# Patient Record
Sex: Female | Born: 1960
Health system: Southern US, Community
[De-identification: ages and names within clinical notes are randomized; demographics above are authoritative.]

## PROBLEM LIST (undated history)

## (undated) DIAGNOSIS — I10 Essential (primary) hypertension: Secondary | ICD-10-CM

## (undated) DIAGNOSIS — Z83719 Family history of colon polyps, unspecified: Secondary | ICD-10-CM

## (undated) DIAGNOSIS — F329 Major depressive disorder, single episode, unspecified: Secondary | ICD-10-CM

## (undated) DIAGNOSIS — M1990 Unspecified osteoarthritis, unspecified site: Secondary | ICD-10-CM

## (undated) DIAGNOSIS — Z923 Personal history of irradiation: Secondary | ICD-10-CM

## (undated) DIAGNOSIS — C50919 Malignant neoplasm of unspecified site of unspecified female breast: Secondary | ICD-10-CM

## (undated) DIAGNOSIS — E119 Type 2 diabetes mellitus without complications: Secondary | ICD-10-CM

## (undated) DIAGNOSIS — F32A Depression, unspecified: Secondary | ICD-10-CM

## (undated) DIAGNOSIS — H269 Unspecified cataract: Secondary | ICD-10-CM

## (undated) DIAGNOSIS — K5792 Diverticulitis of intestine, part unspecified, without perforation or abscess without bleeding: Secondary | ICD-10-CM

## (undated) DIAGNOSIS — K219 Gastro-esophageal reflux disease without esophagitis: Secondary | ICD-10-CM

## (undated) DIAGNOSIS — F419 Anxiety disorder, unspecified: Secondary | ICD-10-CM

## (undated) DIAGNOSIS — Z803 Family history of malignant neoplasm of breast: Secondary | ICD-10-CM

## (undated) DIAGNOSIS — E785 Hyperlipidemia, unspecified: Secondary | ICD-10-CM

## (undated) DIAGNOSIS — K589 Irritable bowel syndrome without diarrhea: Secondary | ICD-10-CM

## (undated) DIAGNOSIS — G4730 Sleep apnea, unspecified: Secondary | ICD-10-CM

## (undated) DIAGNOSIS — C50912 Malignant neoplasm of unspecified site of left female breast: Secondary | ICD-10-CM

## (undated) DIAGNOSIS — Z8371 Family history of colonic polyps: Secondary | ICD-10-CM

## (undated) HISTORY — DX: Unspecified osteoarthritis, unspecified site: M19.90

## (undated) HISTORY — DX: Anxiety disorder, unspecified: F41.9

## (undated) HISTORY — DX: Type 2 diabetes mellitus without complications: E11.9

## (undated) HISTORY — DX: Depression, unspecified: F32.A

## (undated) HISTORY — DX: Irritable bowel syndrome, unspecified: K58.9

## (undated) HISTORY — DX: Diverticulitis of intestine, part unspecified, without perforation or abscess without bleeding: K57.92

## (undated) HISTORY — DX: Family history of malignant neoplasm of breast: Z80.3

## (undated) HISTORY — PX: KNEE ARTHROSCOPY: SHX127

## (undated) HISTORY — DX: Essential (primary) hypertension: I10

## (undated) HISTORY — DX: Sleep apnea, unspecified: G47.30

## (undated) HISTORY — DX: Family history of colonic polyps: Z83.71

## (undated) HISTORY — DX: Family history of colon polyps, unspecified: Z83.719

## (undated) HISTORY — PX: JOINT REPLACEMENT: SHX530

## (undated) HISTORY — PX: WISDOM TOOTH EXTRACTION: SHX21

## (undated) HISTORY — DX: Major depressive disorder, single episode, unspecified: F32.9

---

## 1985-01-03 DIAGNOSIS — K5792 Diverticulitis of intestine, part unspecified, without perforation or abscess without bleeding: Secondary | ICD-10-CM

## 1985-01-03 HISTORY — DX: Diverticulitis of intestine, part unspecified, without perforation or abscess without bleeding: K57.92

## 1993-01-03 HISTORY — PX: LAPAROSCOPIC CHOLECYSTECTOMY: SUR755

## 2004-09-17 ENCOUNTER — Emergency Department (HOSPITAL_COMMUNITY): Admission: EM | Admit: 2004-09-17 | Discharge: 2004-09-17 | Payer: Self-pay | Admitting: Emergency Medicine

## 2007-06-05 ENCOUNTER — Encounter: Admission: RE | Admit: 2007-06-05 | Discharge: 2007-06-05 | Payer: Self-pay | Admitting: Unknown Physician Specialty

## 2007-09-19 ENCOUNTER — Encounter: Admission: RE | Admit: 2007-09-19 | Discharge: 2007-09-19 | Payer: Self-pay | Admitting: Interventional Radiology

## 2008-01-01 ENCOUNTER — Encounter: Admission: RE | Admit: 2008-01-01 | Discharge: 2008-01-01 | Payer: Self-pay | Admitting: Interventional Radiology

## 2008-01-08 ENCOUNTER — Encounter: Admission: RE | Admit: 2008-01-08 | Discharge: 2008-01-08 | Payer: Self-pay | Admitting: Interventional Radiology

## 2008-01-29 ENCOUNTER — Encounter: Admission: RE | Admit: 2008-01-29 | Discharge: 2008-01-29 | Payer: Self-pay | Admitting: Interventional Radiology

## 2008-04-01 ENCOUNTER — Encounter: Admission: RE | Admit: 2008-04-01 | Discharge: 2008-04-01 | Payer: Self-pay | Admitting: Interventional Radiology

## 2008-04-29 ENCOUNTER — Encounter: Admission: RE | Admit: 2008-04-29 | Discharge: 2008-04-29 | Payer: Self-pay | Admitting: Interventional Radiology

## 2008-10-08 ENCOUNTER — Encounter: Admission: RE | Admit: 2008-10-08 | Discharge: 2008-10-08 | Payer: Self-pay | Admitting: Interventional Radiology

## 2009-01-03 HISTORY — PX: TOTAL KNEE ARTHROPLASTY: SHX125

## 2009-10-14 ENCOUNTER — Inpatient Hospital Stay (HOSPITAL_COMMUNITY): Admission: RE | Admit: 2009-10-14 | Discharge: 2009-10-19 | Payer: Self-pay | Admitting: Orthopedic Surgery

## 2009-10-20 ENCOUNTER — Telehealth: Payer: Self-pay | Admitting: *Deleted

## 2010-01-20 ENCOUNTER — Emergency Department (HOSPITAL_COMMUNITY)
Admission: EM | Admit: 2010-01-20 | Discharge: 2010-01-21 | Disposition: A | Discharge status: Other Acute Inpatient Hospital | Payer: Self-pay | Source: Home / Self Care | Admitting: Emergency Medicine

## 2010-01-21 ENCOUNTER — Inpatient Hospital Stay (HOSPITAL_COMMUNITY)
Admission: AD | Admit: 2010-01-21 | Discharge: 2010-01-22 | Payer: Self-pay | Source: Home / Self Care | Attending: Psychiatry | Admitting: Psychiatry
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
## 2010-01-25 ENCOUNTER — Encounter: Payer: Self-pay | Admitting: Interventional Radiology

## 2010-01-25 LAB — DIFFERENTIAL
Basophils Absolute: 0.3 10*3/uL — ABNORMAL HIGH (ref 0.0–0.1)
Lymphocytes Relative: 38 % (ref 12–46)
Monocytes Absolute: 0.6 10*3/uL (ref 0.1–1.0)
Neutro Abs: 3.6 10*3/uL (ref 1.7–7.7)

## 2010-01-25 LAB — URINALYSIS, ROUTINE W REFLEX MICROSCOPIC
Bilirubin Urine: NEGATIVE
Ketones, ur: NEGATIVE mg/dL
Nitrite: NEGATIVE
Protein, ur: NEGATIVE mg/dL
Urobilinogen, UA: 0.2 mg/dL (ref 0.0–1.0)

## 2010-01-25 LAB — BASIC METABOLIC PANEL
CO2: 25 mEq/L (ref 19–32)
Glucose, Bld: 123 mg/dL — ABNORMAL HIGH (ref 70–99)
Potassium: 4.1 mEq/L (ref 3.5–5.1)
Sodium: 140 mEq/L (ref 135–145)

## 2010-01-25 LAB — RAPID URINE DRUG SCREEN, HOSP PERFORMED
Amphetamines: NOT DETECTED
Cocaine: NOT DETECTED
Opiates: NOT DETECTED
Tetrahydrocannabinol: NOT DETECTED

## 2010-01-25 LAB — CBC
HCT: 45.9 % (ref 36.0–46.0)
Hemoglobin: 14.6 g/dL (ref 12.0–15.0)
MCHC: 31.8 g/dL (ref 30.0–36.0)

## 2010-01-26 ENCOUNTER — Other Ambulatory Visit (HOSPITAL_COMMUNITY)
Admission: RE | Admit: 2010-01-26 | Discharge: 2010-02-02 | Payer: Self-pay | Source: Home / Self Care | Attending: Psychiatry | Admitting: Psychiatry

## 2010-02-02 NOTE — Progress Notes (Signed)
Summary: Medication  Phone Note Call from Patient   Caller: Patient Summary of Call: Call from pt regarding need to fill prescription at the [facility].  Pt said that her pharmacy does not carry the medication.  Was told by Wonda Olds  admitting to have the prescription taken to [facility].  Pt was given the number to call the pharmacy to zsee if she can bring over the presription.Angelina Ok RN  October 20, 2009 4:17 PM  Initial call taken by: Angelina Ok RN,  October 20, 2009 4:17 PM

## 2010-02-03 ENCOUNTER — Other Ambulatory Visit (HOSPITAL_COMMUNITY): Payer: BC Managed Care – PPO | Attending: Psychiatry | Admitting: Psychiatry

## 2010-02-03 DIAGNOSIS — F410 Panic disorder [episodic paroxysmal anxiety] without agoraphobia: Secondary | ICD-10-CM | POA: Insufficient documentation

## 2010-02-03 DIAGNOSIS — F411 Generalized anxiety disorder: Secondary | ICD-10-CM | POA: Insufficient documentation

## 2010-02-03 DIAGNOSIS — K5730 Diverticulosis of large intestine without perforation or abscess without bleeding: Secondary | ICD-10-CM | POA: Insufficient documentation

## 2010-02-03 DIAGNOSIS — F339 Major depressive disorder, recurrent, unspecified: Secondary | ICD-10-CM | POA: Insufficient documentation

## 2010-02-03 DIAGNOSIS — G4733 Obstructive sleep apnea (adult) (pediatric): Secondary | ICD-10-CM | POA: Insufficient documentation

## 2010-02-03 DIAGNOSIS — M1990 Unspecified osteoarthritis, unspecified site: Secondary | ICD-10-CM | POA: Insufficient documentation

## 2010-02-04 ENCOUNTER — Other Ambulatory Visit (HOSPITAL_COMMUNITY): Payer: BC Managed Care – PPO | Admitting: Psychiatry

## 2010-02-05 ENCOUNTER — Other Ambulatory Visit (HOSPITAL_COMMUNITY): Payer: BC Managed Care – PPO | Admitting: Psychiatry

## 2010-02-05 ENCOUNTER — Encounter (HOSPITAL_COMMUNITY): Payer: Self-pay | Admitting: Psychiatry

## 2010-02-08 ENCOUNTER — Other Ambulatory Visit (HOSPITAL_COMMUNITY): Payer: BC Managed Care – PPO | Admitting: Psychiatry

## 2010-02-08 ENCOUNTER — Ambulatory Visit (HOSPITAL_COMMUNITY): Payer: Self-pay | Admitting: Psychiatry

## 2010-02-09 ENCOUNTER — Ambulatory Visit (HOSPITAL_COMMUNITY): Payer: Self-pay | Admitting: Psychiatry

## 2010-02-09 ENCOUNTER — Other Ambulatory Visit (HOSPITAL_COMMUNITY): Payer: BC Managed Care – PPO | Admitting: Psychiatry

## 2010-02-10 ENCOUNTER — Other Ambulatory Visit (HOSPITAL_COMMUNITY): Payer: BC Managed Care – PPO | Admitting: Psychiatry

## 2010-02-11 ENCOUNTER — Other Ambulatory Visit (HOSPITAL_COMMUNITY): Payer: BC Managed Care – PPO | Admitting: Psychiatry

## 2010-02-12 ENCOUNTER — Other Ambulatory Visit (HOSPITAL_COMMUNITY): Payer: BC Managed Care – PPO | Admitting: Psychiatry

## 2010-02-15 ENCOUNTER — Other Ambulatory Visit (HOSPITAL_COMMUNITY): Payer: BC Managed Care – PPO | Admitting: Psychiatry

## 2010-02-16 ENCOUNTER — Other Ambulatory Visit (HOSPITAL_COMMUNITY): Payer: BC Managed Care – PPO | Admitting: Psychiatry

## 2010-02-17 ENCOUNTER — Other Ambulatory Visit (HOSPITAL_COMMUNITY): Payer: BC Managed Care – PPO | Admitting: Psychiatry

## 2010-02-18 ENCOUNTER — Other Ambulatory Visit (HOSPITAL_COMMUNITY): Payer: BC Managed Care – PPO | Admitting: Psychiatry

## 2010-02-19 ENCOUNTER — Other Ambulatory Visit (HOSPITAL_COMMUNITY): Payer: BC Managed Care – PPO | Admitting: Psychiatry

## 2010-02-22 ENCOUNTER — Ambulatory Visit (HOSPITAL_COMMUNITY): Payer: Self-pay | Admitting: Psychiatry

## 2010-02-22 ENCOUNTER — Ambulatory Visit (INDEPENDENT_AMBULATORY_CARE_PROVIDER_SITE_OTHER): Payer: BC Managed Care – PPO | Admitting: Psychiatry

## 2010-02-22 DIAGNOSIS — F332 Major depressive disorder, recurrent severe without psychotic features: Secondary | ICD-10-CM

## 2010-02-22 DIAGNOSIS — F411 Generalized anxiety disorder: Secondary | ICD-10-CM

## 2010-02-23 ENCOUNTER — Other Ambulatory Visit (HOSPITAL_COMMUNITY): Payer: BC Managed Care – PPO | Admitting: Psychiatry

## 2010-03-02 ENCOUNTER — Ambulatory Visit (INDEPENDENT_AMBULATORY_CARE_PROVIDER_SITE_OTHER): Payer: BC Managed Care – PPO | Admitting: Psychiatry

## 2010-03-02 ENCOUNTER — Encounter (HOSPITAL_COMMUNITY): Payer: BC Managed Care – PPO | Admitting: Psychiatry

## 2010-03-05 ENCOUNTER — Encounter (INDEPENDENT_AMBULATORY_CARE_PROVIDER_SITE_OTHER): Payer: BC Managed Care – PPO | Admitting: Psychiatry

## 2010-03-05 DIAGNOSIS — F332 Major depressive disorder, recurrent severe without psychotic features: Secondary | ICD-10-CM

## 2010-03-05 DIAGNOSIS — F411 Generalized anxiety disorder: Secondary | ICD-10-CM

## 2010-03-17 LAB — BASIC METABOLIC PANEL
BUN: 15 mg/dL (ref 6–23)
CO2: 29 mEq/L (ref 19–32)
Calcium: 8.8 mg/dL (ref 8.4–10.5)
Creatinine, Ser: 1.35 mg/dL — ABNORMAL HIGH (ref 0.4–1.2)
Glucose, Bld: 129 mg/dL — ABNORMAL HIGH (ref 70–99)

## 2010-03-17 LAB — CBC
MCH: 28.8 pg (ref 26.0–34.0)
MCHC: 33.8 g/dL (ref 30.0–36.0)
Platelets: 211 10*3/uL (ref 150–400)

## 2010-03-18 LAB — URINALYSIS, ROUTINE W REFLEX MICROSCOPIC
Hgb urine dipstick: NEGATIVE
Specific Gravity, Urine: 1.024 (ref 1.005–1.030)
Urobilinogen, UA: 0.2 mg/dL (ref 0.0–1.0)

## 2010-03-18 LAB — PROTIME-INR
INR: 1.04 (ref 0.00–1.49)
INR: 1.15 (ref 0.00–1.49)
Prothrombin Time: 14.9 seconds (ref 11.6–15.2)

## 2010-03-18 LAB — CBC
HCT: 31.8 % — ABNORMAL LOW (ref 36.0–46.0)
Hemoglobin: 10.7 g/dL — ABNORMAL LOW (ref 12.0–15.0)
Hemoglobin: 11.3 g/dL — ABNORMAL LOW (ref 12.0–15.0)
MCHC: 33.6 g/dL (ref 30.0–36.0)
Platelets: 206 10*3/uL (ref 150–400)
Platelets: 227 10*3/uL (ref 150–400)
RBC: 3.97 MIL/uL (ref 3.87–5.11)
RDW: 13.7 % (ref 11.5–15.5)
RDW: 14.1 % (ref 11.5–15.5)
WBC: 10.2 10*3/uL (ref 4.0–10.5)
WBC: 13.3 10*3/uL — ABNORMAL HIGH (ref 4.0–10.5)
WBC: 6 10*3/uL (ref 4.0–10.5)

## 2010-03-18 LAB — ABO/RH: ABO/RH(D): A NEG

## 2010-03-18 LAB — COMPREHENSIVE METABOLIC PANEL
ALT: 22 U/L (ref 0–35)
Albumin: 3.9 g/dL (ref 3.5–5.2)
Alkaline Phosphatase: 120 U/L — ABNORMAL HIGH (ref 39–117)
BUN: 10 mg/dL (ref 6–23)
Calcium: 9.4 mg/dL (ref 8.4–10.5)
Potassium: 4.1 mEq/L (ref 3.5–5.1)
Sodium: 142 mEq/L (ref 135–145)
Total Protein: 7.3 g/dL (ref 6.0–8.3)

## 2010-03-18 LAB — BASIC METABOLIC PANEL
Calcium: 8.4 mg/dL (ref 8.4–10.5)
Calcium: 8.4 mg/dL (ref 8.4–10.5)
Creatinine, Ser: 0.65 mg/dL (ref 0.4–1.2)
GFR calc Af Amer: >60 mL/min (ref >60)
GFR calc non Af Amer: >60 mL/min (ref >60)
GFR calc non Af Amer: >60 mL/min (ref >60)
Glucose, Bld: 141 mg/dL — ABNORMAL HIGH (ref 70–99)
Potassium: 4 mEq/L (ref 3.5–5.1)
Sodium: 133 mEq/L — ABNORMAL LOW (ref 135–145)
Sodium: 136 mEq/L (ref 135–145)

## 2010-03-18 LAB — URINE MICROSCOPIC-ADD ON

## 2010-03-18 LAB — TYPE AND SCREEN
ABO/RH(D): A NEG
Antibody Screen: NEGATIVE

## 2015-01-04 DIAGNOSIS — C50919 Malignant neoplasm of unspecified site of unspecified female breast: Secondary | ICD-10-CM

## 2015-01-04 HISTORY — DX: Malignant neoplasm of unspecified site of unspecified female breast: C50.919

## 2015-05-25 ENCOUNTER — Encounter (HOSPITAL_COMMUNITY): Payer: Self-pay | Admitting: Psychiatry

## 2015-05-25 ENCOUNTER — Ambulatory Visit (INDEPENDENT_AMBULATORY_CARE_PROVIDER_SITE_OTHER): Payer: Medicare Other | Admitting: Psychiatry

## 2015-05-25 ENCOUNTER — Encounter (INDEPENDENT_AMBULATORY_CARE_PROVIDER_SITE_OTHER): Payer: Self-pay

## 2015-05-25 VITALS — BP 132/88 | HR 99 | Ht 67.5 in | Wt 283.0 lb

## 2015-05-25 DIAGNOSIS — F331 Major depressive disorder, recurrent, moderate: Secondary | ICD-10-CM | POA: Diagnosis not present

## 2015-05-25 DIAGNOSIS — F1010 Alcohol abuse, uncomplicated: Secondary | ICD-10-CM | POA: Diagnosis not present

## 2015-05-25 DIAGNOSIS — I10 Essential (primary) hypertension: Secondary | ICD-10-CM | POA: Insufficient documentation

## 2015-05-25 MED ORDER — DULOXETINE HCL 60 MG PO CPEP: 60.0000 mg | ORAL_CAPSULE | Freq: Every day | ORAL | Status: DC

## 2015-05-25 MED ORDER — LAMOTRIGINE 25 MG PO TABS: ORAL_TABLET | ORAL | Status: DC
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
## 2015-05-27 ENCOUNTER — Ambulatory Visit (HOSPITAL_COMMUNITY): Payer: Self-pay | Admitting: Clinical

## 2015-06-15 ENCOUNTER — Ambulatory Visit (HOSPITAL_COMMUNITY): Payer: Self-pay | Admitting: Psychiatry

## 2015-06-16 ENCOUNTER — Ambulatory Visit (HOSPITAL_COMMUNITY): Payer: Medicare Other | Admitting: Psychiatry

## 2015-07-02 ENCOUNTER — Telehealth (HOSPITAL_COMMUNITY): Payer: Self-pay | Admitting: *Deleted

## 2015-07-02 NOTE — Telephone Encounter (Signed)
phone call from patient.  She cancelled appointment due to she has another appointment that day also, she do not have the money.

## 2015-07-06 ENCOUNTER — Ambulatory Visit (HOSPITAL_COMMUNITY): Payer: Self-pay | Admitting: Psychiatry

## 2015-07-28 ENCOUNTER — Other Ambulatory Visit (HOSPITAL_COMMUNITY): Payer: Self-pay

## 2015-07-28 DIAGNOSIS — F331 Major depressive disorder, recurrent, moderate: Secondary | ICD-10-CM

## 2015-07-28 MED ORDER — LAMOTRIGINE 25 MG PO TABS: ORAL_TABLET | ORAL | 0 refills | Status: DC

## 2015-07-28 MED ORDER — DULOXETINE HCL 60 MG PO CPEP: 60.0000 mg | ORAL_CAPSULE | Freq: Every day | ORAL | 0 refills | Status: DC

## 2015-08-17 ENCOUNTER — Ambulatory Visit (HOSPITAL_COMMUNITY): Payer: Self-pay | Admitting: Psychiatry

## 2015-08-18 ENCOUNTER — Encounter (HOSPITAL_COMMUNITY): Payer: Self-pay | Admitting: Psychiatry

## 2015-09-01 ENCOUNTER — Encounter (INDEPENDENT_AMBULATORY_CARE_PROVIDER_SITE_OTHER): Payer: Self-pay | Admitting: *Deleted

## 2015-09-03 ENCOUNTER — Other Ambulatory Visit (HOSPITAL_COMMUNITY): Payer: Self-pay

## 2015-09-03 DIAGNOSIS — F331 Major depressive disorder, recurrent, moderate: Secondary | ICD-10-CM

## 2015-09-03 MED ORDER — DULOXETINE HCL 60 MG PO CPEP: 60.0000 mg | ORAL_CAPSULE | Freq: Every day | ORAL | 0 refills | Status: DC

## 2015-09-04 HISTORY — PX: BREAST BIOPSY: SHX20

## 2015-09-08 ENCOUNTER — Encounter (HOSPITAL_COMMUNITY): Payer: Self-pay

## 2015-09-08 ENCOUNTER — Ambulatory Visit (HOSPITAL_COMMUNITY): Payer: Self-pay | Admitting: Psychiatry

## 2015-09-09 ENCOUNTER — Encounter (INDEPENDENT_AMBULATORY_CARE_PROVIDER_SITE_OTHER): Payer: Self-pay | Admitting: *Deleted

## 2015-09-21 ENCOUNTER — Ambulatory Visit (HOSPITAL_COMMUNITY): Payer: Self-pay | Admitting: Psychiatry

## 2015-09-23 ENCOUNTER — Encounter (HOSPITAL_COMMUNITY): Payer: Self-pay | Admitting: Psychiatry

## 2015-09-23 ENCOUNTER — Ambulatory Visit (INDEPENDENT_AMBULATORY_CARE_PROVIDER_SITE_OTHER): Payer: Medicare Other | Admitting: Psychiatry

## 2015-09-23 DIAGNOSIS — F331 Major depressive disorder, recurrent, moderate: Secondary | ICD-10-CM | POA: Diagnosis not present
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
## 2015-09-24 ENCOUNTER — Ambulatory Visit (INDEPENDENT_AMBULATORY_CARE_PROVIDER_SITE_OTHER): Payer: Medicare Other | Admitting: Internal Medicine

## 2015-09-24 ENCOUNTER — Encounter (INDEPENDENT_AMBULATORY_CARE_PROVIDER_SITE_OTHER): Payer: Self-pay | Admitting: Internal Medicine

## 2015-09-24 ENCOUNTER — Other Ambulatory Visit (INDEPENDENT_AMBULATORY_CARE_PROVIDER_SITE_OTHER): Payer: Self-pay | Admitting: Internal Medicine

## 2015-09-24 ENCOUNTER — Encounter (INDEPENDENT_AMBULATORY_CARE_PROVIDER_SITE_OTHER): Payer: Self-pay | Admitting: *Deleted

## 2015-09-24 ENCOUNTER — Telehealth (INDEPENDENT_AMBULATORY_CARE_PROVIDER_SITE_OTHER): Payer: Self-pay | Admitting: *Deleted

## 2015-09-24 VITALS — BP 100/80 | HR 72 | Temp 97.8°F | Ht 68.0 in | Wt 273.6 lb

## 2015-09-24 DIAGNOSIS — K625 Hemorrhage of anus and rectum: Secondary | ICD-10-CM | POA: Diagnosis not present

## 2015-09-24 DIAGNOSIS — K5732 Diverticulitis of large intestine without perforation or abscess without bleeding: Secondary | ICD-10-CM | POA: Diagnosis not present

## 2015-09-24 DIAGNOSIS — R195 Other fecal abnormalities: Secondary | ICD-10-CM

## 2015-09-24 DIAGNOSIS — E119 Type 2 diabetes mellitus without complications: Secondary | ICD-10-CM | POA: Insufficient documentation

## 2015-09-24 NOTE — Patient Instructions (Signed)
The risks and benefits such as perforation, bleeding, and infection were reviewed with the patient and is agreeable. 

## 2015-09-24 NOTE — Progress Notes (Signed)
   Subjective:    Patient ID: Lindsay Pacheco, female    DOB: 1960-02-18, 55 y.o.   MRN: [id number]  HPI Referred by Dr. Manuella Ghazi for hx of diverticulitis, rectal bleeding, hx of anal fissure/colonoscopy. Her last colonoscopy was in her 52s. She had diverticulitis. She says 3-4 months ago she had upper abdominal pain. Her BMs changed from diarrhea to constipation. She would have 3-4 BMs a day. She had sporadic vomiting. During this time, she said she would wipe and really did not feel like she was clean. She says she became raw in her rectal area. Symptoms for about 2 months and is better now.  She saw her PCP and was advised to have a colonoscopy. She also states she has rectal bleeding off and on. She tells a month ago 8/8/017) she had LLQ pain and CT revealed mild sigmoid diverticulitis. No drainable fluid collection,abscess.  diverticulitis, She was treated with Cipro and Flagyl.  Her appetite is good. No weight. Her BMs are normal.She does have some semi-constipation. She does see some rectal bleeding. Occasionally has some left lower abdominal pain.  Family hx of colon polyps in mother. No family hx of colon cancer.   08/10/2015 WBC 11.7, H and H 13.2 and 40.8, [place] ct 315. Review of Systems Past Medical History:  Diagnosis Date  . Anxiety   . Arthritis   . Depression   . Diabetes mellitus, type II ([place])   . Diverticulitis 1987  . Hypertension   . Irritable bowel syndrome (IBS)   . Sleep apnea   . Sleep apnea     Past Surgical History:  Procedure Laterality Date  . CHOLECYSTECTOMY    . KNEE ARTHROPLASTY      Allergies  Allergen Reactions  . Sulfa Antibiotics Nausea And Vomiting    Current Outpatient Prescriptions on File Prior to Visit  Medication Sig Dispense Refill  . atorvastatin (LIPITOR) 10 MG tablet Take 10 mg by mouth daily.    . DULoxetine (CYMBALTA) 60 MG capsule Take 1 capsule (60 mg total) by mouth daily. 30 capsule 0  . lamoTRIgine (LAMICTAL) 25 MG tablet Take  1 tab daily for 1 week and than 2 tab daily 60 tablet 0  . lisinopril-hydrochlorothiazide (PRINZIDE,ZESTORETIC) 20-12.5 MG tablet Take 1 tablet by mouth daily.  6  . LORazepam (ATIVAN) 0.5 MG tablet Take 0.5 mg by mouth daily as needed for anxiety.    . metFORMIN (GLUCOPHAGE) 500 MG tablet Take by mouth 2 (two) times daily with a meal.     No current facility-administered medications on file prior to visit.        Objective:   Physical Exam Blood pressure 100/80, pulse 72, temperature 97.8 F (36.6 C), height 5\' 8"  (1.727 m), weight 273 lb 9.6 oz (124.1 kg). Alert and oriented. Skin warm and dry. Oral mucosa is moist.   . Sclera anicteric, conjunctivae is pink. Thyroid not enlarged. No cervical lymphadenopathy. Lungs clear. Heart regular rate and rhythm.  Abdomen is soft. Bowel sounds are positive. No hepatomegaly. No abdominal masses felt. No tenderness.  No edema to lower extremities. Patient is alert and oriented.       Assessment & Plan:  Change in stool, Rectal bleeding. Hx of diverticulitis: Colonic neoplasm needs to be ruled out. The risks and benefits such as perforation, bleeding, and infection were reviewed with the patient and is agreeable.

## 2015-09-24 NOTE — Telephone Encounter (Signed)
Patient needs trilyte 

## 2015-09-25 MED ORDER — PEG 3350-KCL-NA BICARB-NACL 420 G PO SOLR: 4000.0000 mL | Freq: Once | ORAL | 0 refills | Status: AC

## 2015-09-29 ENCOUNTER — Encounter (INDEPENDENT_AMBULATORY_CARE_PROVIDER_SITE_OTHER): Payer: Self-pay

## 2015-09-30 DIAGNOSIS — C50912 Malignant neoplasm of unspecified site of left female breast: Secondary | ICD-10-CM

## 2015-09-30 HISTORY — DX: Malignant neoplasm of unspecified site of left female breast: C50.912

## 2015-10-07 ENCOUNTER — Encounter (HOSPITAL_COMMUNITY): Payer: Self-pay | Admitting: Psychiatry

## 2015-10-07 ENCOUNTER — Ambulatory Visit (INDEPENDENT_AMBULATORY_CARE_PROVIDER_SITE_OTHER): Payer: Medicare Other | Admitting: Psychiatry

## 2015-10-07 DIAGNOSIS — F331 Major depressive disorder, recurrent, moderate: Secondary | ICD-10-CM

## 2015-10-07 NOTE — Progress Notes (Signed)
Patient:  Lindsay Pacheco   DOB: Jul 02, 1960  MR Number: [id number]  Location: [facility]:  [id number] [place],  [place], [address]  Start: Wednesday 10/07/2015 8:08 AM End: Wednesday 10/07/2015 9:05 AM  Provider/Observer:     Maurice Small, MSW, LCSW   Chief Complaint:      Chief Complaint  Patient presents with  . Depression    Reason For Service:  Patient is a 55 year old female referred for services by psychiatrist [name] to improve coping skills. She presents with a long standing history of symptoms of depression and anxiety that began over 20 years ago. Symptoms have worsened in the past 3 years since mother moved in with patient as mother is vey confrontational. Mother also expereinces confusion and memory problems per patient's report.  She reports additional stress related to concerns about her daughter. Patient also reports discord with sister and her father. Patient reports 1 psychiatriac hospitalization due to suicide attempt in 2012. She has participated in outpatient medication management at [facility] and currently is seeing psychiatrist [name] at [facility] in [place]. She also participated in IOP.  She participated in outpatient therapy briefly in this practice several years ago until she lost her insurance.  Patient reports crying spells, excessive worry, panic attacks, nervousness, low energy, sleep difficulty due to sleep apnea, memory problmes, loss of interest , irritabiliy, ruminating thoughts.     Interventions Strategy:  Supportive  Participation Level:   Active  Participation Quality:  Appropriate      Behavioral Observation:  Casual, Alert, and Appropriate.   Current Psychosocial Factors: Recent diagnosis of breast cancer, stressful relationship with mother, father, and sister, worries about daughter in college, financial stress   Content of Session:   Established rapport, reviewed symptoms, facilitated expression of feelings and normalization of  shock and denial regarding recent diagnosis, discussed other stressors including relationship with family members, praised and reinforced patient's use of her support system, discussed rationale for and practiced controlled breathing, assigned patient to practice controlled breathing 5 minutes 2 times per day  Current Status:   Less depressed mood, panic attacks, nervousness, low energy, sleep difficulty, ruminating thoughts, irritability  Suicidal/Homicidal:   No  Patient Progress:   Fair. Patient reports less depressed mood but increased stress and anxiety since last session. She was diagnosed with breast cancer in her left breast last Friday, 10/02/2015. She is scheduled for surgical consult on 10/21/2015. She reports blaming self for this as she was unable to have regular checkups and mammograms due to not having insurance. She expresses confidence in her medical providers and is trying to be optimistic. She has informed family and close friends and reports strong support. She expresses concern how this may affect her daughter. Patient has tried to engage in activities and reports helping out with a yard sale  on Saturday and going to visit her daughter at [facility] yesterday. She continues to express frustration with her mother and is concerned about mother's behavior and reactions to patient's diagnosis and upcoming treatment for patient.  Target Goals:   1. Establish rapport, 2. learn and implement calming strategies  Last Reviewed:     Goals Addressed Today:   1,2     Plan:   Return again in 2 weeks. Patient agrees to practice controlled breathing 5 minutes 2 times per day.  Impression/Diagnosis:   Patient  presents with a long standing history of symptoms of depression and anxiety that began over 20 years ago. Symptoms  have worsened in the past 3 years since mother moved in with patient as mother is vey confrontational. She reports 1 psychiatriac hospitalization due to suicide  attempt in 2012. She has participated in outpatient medication management at [facility] and currently is seeing psychiatrist [name] at [facility] in [place]. She also participated in IOP.  She participated in outpatient therapy briefly in this practice several years ago until she lost her insurance.  Patient reports crying spells, excessive worry, panic attacks, nervousness, low energy, sleep difficulty due to sleep apnea, memory problmes, loss of interest , irritabiliy, and ruminating thoughts.      Diagnosis:  Axis I: Major Depressive Disorder, Recurrent, Moderate          Axis II: Deferred

## 2015-10-08 ENCOUNTER — Other Ambulatory Visit (HOSPITAL_COMMUNITY): Payer: Self-pay

## 2015-10-08 DIAGNOSIS — F331 Major depressive disorder, recurrent, moderate: Secondary | ICD-10-CM

## 2015-10-08 MED ORDER — LAMOTRIGINE 25 MG PO TABS: ORAL_TABLET | ORAL | 0 refills | Status: DC

## 2015-10-08 MED ORDER — DULOXETINE HCL 60 MG PO CPEP: 60.0000 mg | ORAL_CAPSULE | Freq: Every day | ORAL | 0 refills | Status: DC

## 2015-10-08 NOTE — Progress Notes (Signed)
Patient called for a refill, she has not seen [name] since May and was going to schedule at [facility] office. Patient has not been seen there yet, and has no showed 2 appointments with Dr. Harrington Challenger, I spoke to patient and expressed the importance of keeping the appointment or at least giving 24 hour notice. Patient verbalized her understanding and I told her that she could get one more month of medication. Rx's were sent into [facility] - Lamictal and Cymbalta 30 day orders. Patient understands that this is the last refill and she needs to be seen.

## 2015-10-09 ENCOUNTER — Other Ambulatory Visit: Payer: Self-pay | Admitting: General Surgery

## 2015-10-09 DIAGNOSIS — Z170 Estrogen receptor positive status [ER+]: Principal | ICD-10-CM

## 2015-10-09 DIAGNOSIS — C50212 Malignant neoplasm of upper-inner quadrant of left female breast: Secondary | ICD-10-CM

## 2015-10-12 ENCOUNTER — Telehealth (HOSPITAL_COMMUNITY): Payer: Self-pay | Admitting: *Deleted

## 2015-10-12 ENCOUNTER — Ambulatory Visit
Admission: RE | Admit: 2015-10-12 | Discharge: 2015-10-12 | Disposition: A | Discharge status: Home / Self Care | Payer: Medicare Other | Source: Ambulatory Visit | Attending: General Surgery | Admitting: General Surgery

## 2015-10-12 DIAGNOSIS — C50212 Malignant neoplasm of upper-inner quadrant of left female breast: Secondary | ICD-10-CM

## 2015-10-12 DIAGNOSIS — Z170 Estrogen receptor positive status [ER+]: Principal | ICD-10-CM

## 2015-10-12 MED ORDER — GADOBENATE DIMEGLUMINE 529 MG/ML IV SOLN
20.0000 mL | Freq: Once | INTRAVENOUS | Status: AC | PRN
  Administered 2015-10-12: 20 mL via INTRAVENOUS

## 2015-10-12 NOTE — Telephone Encounter (Signed)
Pt called stating she called to sch an appt with Dr. Harrington Challenger and no one called her back. Per pt she is aware she missed 2 appts with Dr. Harrington Challenger and then stated stated she's aware that she has up to 3 times. Informed pt that per no show policy, if she missed 2 consecutive appts with provider, per policy office can not resch appt but staff will need to speak with provider first. Office then informed pt that with the 3 no show, policy states dismissal from practice and she already have 3 in system but staff need to talk to Dr. Harrington Challenger. Per pt why didn't staff tell her the first time when they told her they will have to call back and when will she know when she can be resch or not because she was only given 30 days worth of medications and the next time she can see [name] is in December. Informed pt that office now have a new provider that see adult patients due to Dr. Harrington Challenger trying to transition in taking majority children but office still have to talk with Dr. Harrington Challenger. Pt then increased her voice and stated well who will she be seeing and why was this information not provided to her the first time. Pt then stated in a very unpleasant way and increased her tone of voice, why does staff have to consult with Dr. Harrington Challenger if the policy is stated if we have to follow the no show policy? Informed pt that staff will have to call her back and staff apologized about not calling her back sooner but provider was seeing patients and when she is less busy but  in betweeen her pts staff will call her back. Per pt when will staff call her back and who will be her new provider. Per pt she wants Dr. Harrington Challenger to call her back. Informed pt staff will try her hardest to call her back today after she consults with provider. Spoke with Dr. Harrington Challenger and informed her pt is [name] pt that wants to transfer to this office and did schedule 2 new pt appt with her but no showed for both and also have another no show on file for office. Asked Dr. Harrington Challenger if she would  like office to resch pt to see her again or resch new pt appt to see [name]? Per Dr. Harrington Challenger, per no show policy, pt can not be resch and to inform pt. Called pt at 4:08pm 10-12-15 and lmtcb. Office called back at 4:09pm and pt daughter Lindsay Pacheco picked up and stated pt is at another doctor's appt. Informed Lindsay Pacheco to please have pt call office and office number was provided.

## 2015-10-13 ENCOUNTER — Encounter: Payer: Self-pay | Admitting: Radiation Oncology

## 2015-10-13 ENCOUNTER — Telehealth (HOSPITAL_COMMUNITY): Payer: Self-pay

## 2015-10-13 NOTE — Telephone Encounter (Signed)
Telephone call with patient to follow up on call from 10/12/15 with patient's request the [facility] office still allow her to be scheduled at their facility following 2 missed initial MD evaluations.  Informed patient [name] reviewed and would see her on Thursday 10/22/15 at the [place] office, as schedule with Radene Journey from their reception and for patient to be there at 1pm.  Informed patient of need to keep this appointment due to she had missed the previous two and patient agreed with plan. Patient reported recently being diagnosed with breast cancer and wanted to make sure she is seen by a psychiatrist before she runs out of medication by the beginning of November.  Patient stated plan to keep newly scheduled appointment and will call if any problems.

## 2015-10-13 NOTE — Progress Notes (Addendum)
Location of Breast Cancer: Left Breast  11:00'clock and 12 0'clock position Upper Inner  quadrant  Histology per Pathology Report: 09/30/2015: Left breast bx x 2 :Invasive ductal carcinoma,low grade,localized in situ,cribiform type,with calcifications,   Receptor Status: ER(90%+), PR (90%), Her2-neu (), Ki-()  Did patient present with symptoms (if so, please note symptoms) or was this found on screening mammography?:   Past/Anticipated interventions by surgeon, if any: Dr. Darleen Crocker, waiting on MRI results  That was done 10/12/15, before scheduling lumpectomy  Past/Anticipated interventions by medical oncology, if any: Chemotherapy :Dr. Whitney Muse , MD, 10/15/2015 new appt  Lymphedema issues, if any:  NO , last bowel movement  Slight spot bleeding on tissue, has GI appt  scheduled PA 12/09/15 then will schedule colonoscopy, has diverticulosis Pain issues, if any: NO  SAFETY ISSUES: No  Prior radiation? NO  Pacemaker/ICD?NO  Possible current pregnancy? NO  Is the patient on methotrexate?  NO  Current Complaints / other details:, Divorced,  menses age 22, G49P1, first live birth age 27, , birth control pills till age 62, [place]  , Major depression,  Anxiety, non smoker, hx marijuana use Paternal aunt Breast  Cancer in her 28's  , Paternal Uncle lung cancer, Paternal grandmother and Maternal grandfather lung cancer, all smokers, Maternal great aunt tumor on left femur, removed, age 75 living,   Allergies: Sulfa antibiotics= Nausea and vomiting BP 134/79 (BP Location: Right Leg, Patient Position: Sitting, Cuff Size: Large)   Pulse 99   Temp 97.7 F (36.5 C) (Oral)   Resp 16   Ht 5' 7.5" (1.715 m)   Wt 274 lb 6.4 oz (124.5 kg)   SpO2 100% Comment: room air  BMI 42.34 kg/m   Wt Readings from Last 3 Encounters:  10/14/15 274 lb 6.4 oz (124.5 kg)  09/24/15 273 lb 9.6 oz (124.1 kg)   Rebecca Eaton, RN 10/13/2015,7:50 AM

## 2015-10-14 ENCOUNTER — Encounter: Payer: Self-pay | Admitting: Radiation Oncology

## 2015-10-14 ENCOUNTER — Ambulatory Visit
Admission: RE | Admit: 2015-10-14 | Discharge: 2015-10-14 | Disposition: A | Discharge status: Home / Self Care | Payer: Medicare Other | Source: Ambulatory Visit | Attending: Radiation Oncology | Admitting: Radiation Oncology

## 2015-10-14 VITALS — BP 134/79 | HR 99 | Temp 97.7°F | Resp 16 | Ht 67.5 in | Wt 274.4 lb

## 2015-10-14 DIAGNOSIS — Z79899 Other long term (current) drug therapy: Secondary | ICD-10-CM | POA: Insufficient documentation

## 2015-10-14 DIAGNOSIS — Z7984 Long term (current) use of oral hypoglycemic drugs: Secondary | ICD-10-CM | POA: Diagnosis not present

## 2015-10-14 DIAGNOSIS — C50212 Malignant neoplasm of upper-inner quadrant of left female breast: Secondary | ICD-10-CM | POA: Diagnosis not present

## 2015-10-14 DIAGNOSIS — Z170 Estrogen receptor positive status [ER+]: Secondary | ICD-10-CM | POA: Diagnosis not present

## 2015-10-14 DIAGNOSIS — Z510 Encounter for antineoplastic radiation therapy: Secondary | ICD-10-CM | POA: Insufficient documentation

## 2015-10-14 DIAGNOSIS — Z882 Allergy status to sulfonamides status: Secondary | ICD-10-CM | POA: Insufficient documentation

## 2015-10-14 HISTORY — DX: Malignant neoplasm of unspecified site of unspecified female breast: C50.919

## 2015-10-14 NOTE — Progress Notes (Signed)
Please see the Nurse Progress Note in the MD Initial Consult Encounter for this patient. 

## 2015-10-14 NOTE — Progress Notes (Signed)
Radiation Oncology         [phone] ________________________________  Name: Lindsay Pacheco MRN: [id number]  Date: 10/14/2015  DOB: [birth year]  LX:BWIO,MBTDHR, MD  [name], MD     REFERRING PHYSICIAN: [name], MD   DIAGNOSIS: The encounter diagnosis was Malignant neoplasm of upper-inner quadrant of left breast in female, estrogen receptor positive ([place]).   Malignant neoplasm of upper inner quadrant of female breast Lindsay Pacheco)   Staging form: Breast, AJCC 7th Edition   - Clinical stage from 10/14/2015: Stage IIA (T2, N0, M0) - Signed by Kyung Rudd, MD on 10/14/2015    HISTORY OF PRESENT ILLNESS::Lindsay Pacheco is a 55 y.o. female who is seen for an initial consultation visit regarding the patient's diagnosis of malignant neoplasm of upper-inner quadrant of left breast.  The patient had a suspicious finding on screening mammogram which demonstrated a specific mass with possible several adjacent areas, potentially cysts. Per pathology report on 09/30/15 left breast was positive for invasive ductal carcinoma, low grade, localized in situ, cribriform type, with calcifications. A second biopsy also revealed ductal carcinoma in situ. Regarding the invasive cancer, Receptor status: ER (90%), PR (90%), Her2 (-).   An MRI scan was performed on 10/12/2015. This demonstrated a 2.2 cm irregular enhancing mass at the 12:00 position corresponding to the low-grade invasive ductal carcinoma. At the 11:00 position a 1.1 cm irregular enhancing mass was also seen corresponding to the diagnosis of ductal carcinoma in situ. No abnormal appearing lymph nodes were present. Dr. Donne Pacheco has been waiting on MRI results from 10/12/15 before scheduling lumpectomy. She does appear to be a good candidate for breast conservation treatment. Patient has an appointment with Dr. Whitney Pacheco scheduled for 10/05/15.    PREVIOUS RADIATION THERAPY: No   PAST MEDICAL HISTORY:  has a past medical history of  Anxiety; Arthritis; Breast cancer ([place]) (09/30/2015); Depression; Diabetes mellitus, type II ([place]); Diverticulitis (1987); Hypertension; Irritable bowel syndrome (IBS); Sleep apnea; and Sleep apnea.     PAST SURGICAL HISTORY: Past Surgical History:  Procedure Laterality Date  . CHOLECYSTECTOMY    . KNEE ARTHROPLASTY       FAMILY HISTORY: family history includes Anxiety disorder in her father; Depression in her father and sister.   SOCIAL HISTORY:  reports that she has never smoked. She has never used smokeless tobacco. She reports that she drinks alcohol. She reports that she uses drugs, including Marijuana.   ALLERGIES: Sulfa antibiotics   MEDICATIONS:  Current Outpatient Prescriptions  Medication Sig Dispense Refill  . atorvastatin (LIPITOR) 10 MG tablet Take 10 mg by mouth daily.    . DULoxetine (CYMBALTA) 60 MG capsule Take 1 capsule (60 mg total) by mouth daily. 30 capsule 0  . lamoTRIgine (LAMICTAL) 25 MG tablet 2 tabs by mouth daily 60 tablet 0  . lisinopril-hydrochlorothiazide (PRINZIDE,ZESTORETIC) 20-12.5 MG tablet Take 1 tablet by mouth daily.  6  . LORazepam (ATIVAN) 0.5 MG tablet Take 0.5 mg by mouth daily as needed for anxiety.    . metFORMIN (GLUCOPHAGE) 500 MG tablet Take by mouth 2 (two) times daily with a meal.    . nitroGLYCERIN (NITROGLYN) 2 % ointment Apply topically 4 (four) times daily.     No current facility-administered medications for this encounter.      REVIEW OF SYSTEMS:  A 15 point review of systems is documented in the electronic medical record. This was obtained by the nursing staff. However, I reviewed this with the patient to discuss relevant findings and make  appropriate changes.  Pertinent items are noted in HPI.   Patient denies lymphedema issues, or pain at this time. Patient notes slight spot bleeding on tissue during last bowel movement. She has scheduled a GI appointment on 12/09/15 and will schedule a colonoscopy.  PHYSICAL EXAM:  height  is 5' 7.5" (1.715 m) and weight is 274 lb 6.4 oz (124.5 kg). Her oral temperature is 97.7 F (36.5 C). Her blood pressure is 134/79 and her pulse is 99. Her respiration is 16 and oxygen saturation is 100%.     ECOG = 0  0 - Asymptomatic (Fully active, able to carry on all predisease activities without restriction)  1 - Symptomatic but completely ambulatory (Restricted in physically strenuous activity but ambulatory and able to carry out work of a light or sedentary nature. For example, light housework, office work)  2 - Symptomatic, <50% in bed during the day (Ambulatory and capable of all self care but unable to carry out any work activities. Up and about more than 50% of waking hours)  3 - Symptomatic, >50% in bed, but not bedbound (Capable of only limited self-care, confined to bed or chair 50% or more of waking hours)  4 - Bedbound (Completely disabled. Cannot carry on any self-care. Totally confined to bed or chair)  5 - Death   Eustace Pen MM, Creech RH, Tormey DC, et al. [phone]). "Toxicity and response criteria of the [facility]". [place] Oncol. 5 (6): 649-55  General: Well-developed, in no acute distress HEENT: Normocephalic, atraumatic; oral cavity clear Neck: Supple without any lymphadenopathy Cardiovascular: Regular rate and rhythm Respiratory: Clear to auscultation bilaterally Breasts:   Palpable mass present centrally at the 12:00 position with some fibrocystic change within the left breast. No axillary lymphadenopathy on the left. Benign findings on the right.  GI: Soft, nontender, normal bowel sounds Extremities: No edema present Neuro: No focal deficits     LABORATORY DATA:  Lab Results  Component Value Date   WBC 7.5 01/20/2010   HGB 14.6 01/20/2010   HCT 45.9 01/20/2010   MCV 82.9 01/20/2010   PLT 304 01/20/2010   Lab Results  Component Value Date   NA 140 01/20/2010   K 4.1 01/20/2010   CL 106 01/20/2010   CO2 25 01/20/2010    Lab Results  Component Value Date   ALT 22 10/06/2009   AST 22 10/06/2009   ALKPHOS 120 (H) 10/06/2009   BILITOT 0.6 10/06/2009      RADIOGRAPHY: Mr Breast Bilateral W Wo Contrast  Result Date: 10/14/2015 CLINICAL DATA:  Ultrasound-guided core biopsy of a mass in the 12 o'clock region of the left breast revealed low grade invasive ductal carcinoma as well as ductal carcinoma in-situ with calcifications. Ultrasound-guided core biopsy of the mass in the 11 o'clock region of the left breast revealed ductal carcinoma in-situ as well as fibrotic and inflamed breast tissue. LABS:  Creatinine was obtained on site at [facility] at [address]Results: Creatinine 0.8 mg/dL. EXAM: BILATERAL BREAST MRI WITH AND WITHOUT CONTRAST TECHNIQUE: Multiplanar, multisequence MR images of both breasts were obtained prior to and following the intravenous administration of 20 ml of MultiHance. THREE-DIMENSIONAL MR IMAGE RENDERING ON INDEPENDENT WORKSTATION: Three-dimensional MR images were rendered by post-processing of the original MR data on an independent workstation. The three-dimensional MR images were interpreted, and findings are reported in the following complete MRI report for this study. Three dimensional images were evaluated at the independent DynaCad workstation COMPARISON:  Previous exam(s). FINDINGS: Breast composition: c. Heterogeneous fibroglandular tissue. Background parenchymal enhancement: Marked enhancing fibronodular pattern, bilaterally. Right breast: No mass or abnormal enhancement. Left breast: In the 12 o'clock region of the left breast there is an irregular spiculated enhancing mass measuring 2.2 x 1.5 x 1.3 cm. There is a signal void artifact in the mass from the biopsy clip. In the upper-inner quadrant of the left breast at 11 o'clock there is asymmetric irregular enhancement measuring 1.1 x 0.8 x 0.7 cm. Signal void artifact is seen in the enhancement from the biopsy clip. Both  enhancing masses are seen in a marked background of fibronodular enhancement obscuring borders. A benign intramammary lymph node is seen in the lateral aspect of the breast. Lymph nodes: No abnormal appearing lymph nodes. Ancillary findings:  None. IMPRESSION: 2.2 cm irregular enhancing mass in the 12 o'clock region of the left breast corresponding with the biopsied low grade invasive ductal carcinoma and ductal carcinoma in-situ. 1.1 cm irregular enhancement in the 11 o'clock region of the left breast corresponding with the biopsied ductal carcinoma in-situ with fibrotic and inflamed breast tissue. RECOMMENDATION: Treatment planning of the known left breast cancer is recommended. BI-RADS CATEGORY  6: Known biopsy-proven malignancy. Electronically Signed   By: Lillia Mountain M.D.   On: 10/14/2015 11:32       IMPRESSION:   Malignant neoplasm of upper inner quadrant of female breast [facility])   Staging form: Breast, AJCC 7th Edition   - Clinical stage from 10/14/2015: Stage IIA (T2, N0, M0) - Signed by Kyung Rudd, MD on 10/14/2015  The patient has a recent diagnosis of invasive ductile carcinoma with secondary DCIS of the left breast. She appears to be a good candidate for breast conservation treatment. if she proceeds with this, she will benefit from adjuvant radiation treatment. At this time, she would receive whole breast radiation treatment.   I discussed with the patient the role of adjuvant radiation treatment in this setting. We discussed the potential benefit of radiation treatment, especially with regards to local control of the patient's tumor. We also discussed the possible side effects and risks of such a treatment as well.  All of the patient's questions were answered. The patient wishes to proceed with radiation treatment at the appropriate time.  PLAN: I look forward to seeing the patient postoperatively to review her case and further discuss and coordinate an anticipated course of radiation  treatment. We discussed a 6.5 week course of radiation treatment.    ________________________________   [name], MD, PhD   **Disclaimer: This note was dictated with voice recognition software. Similar sounding words can inadvertently be transcribed and this note may contain transcription errors which may not have been corrected upon publication of note.** This document serves as a record of services personally performed by Kyung Rudd, MD. It was created on his behalf by Bethann Humble, a trained medical scribe. The creation of this record is based on the scribe's personal observations and the provider's statements to them. This document has been checked and approved by the attending provider.

## 2015-10-15 ENCOUNTER — Encounter (HOSPITAL_COMMUNITY): Payer: Medicare Other | Attending: Hematology & Oncology | Admitting: Hematology & Oncology

## 2015-10-15 ENCOUNTER — Encounter (HOSPITAL_COMMUNITY): Payer: Self-pay | Admitting: Hematology & Oncology

## 2015-10-15 ENCOUNTER — Other Ambulatory Visit: Payer: Self-pay | Admitting: General Surgery

## 2015-10-15 DIAGNOSIS — Z170 Estrogen receptor positive status [ER+]: Secondary | ICD-10-CM

## 2015-10-15 DIAGNOSIS — M1990 Unspecified osteoarthritis, unspecified site: Secondary | ICD-10-CM

## 2015-10-15 DIAGNOSIS — K5732 Diverticulitis of large intestine without perforation or abscess without bleeding: Secondary | ICD-10-CM

## 2015-10-15 DIAGNOSIS — C50212 Malignant neoplasm of upper-inner quadrant of left female breast: Secondary | ICD-10-CM

## 2015-10-15 DIAGNOSIS — M153 Secondary multiple arthritis: Secondary | ICD-10-CM

## 2015-10-15 DIAGNOSIS — C50912 Malignant neoplasm of unspecified site of left female breast: Secondary | ICD-10-CM

## 2015-10-15 DIAGNOSIS — E119 Type 2 diabetes mellitus without complications: Secondary | ICD-10-CM

## 2015-10-15 NOTE — Patient Instructions (Signed)
[place] at [facility] Discharge Instructions  RECOMMENDATIONS MADE BY THE CONSULTANT AND ANY TEST RESULTS WILL BE SENT TO YOUR REFERRING PHYSICIAN.  You saw Dr. Whitney Muse today. Appointments will be scheduled after surgery.  Thank you for choosing [place] at [facility] to provide your oncology and hematology care.  To afford each patient quality time with our provider, please arrive at least 15 minutes before your scheduled appointment time.   Beginning January 23rd 2017 lab work for the [facility] will be done in the  Main lab at [facility] on 1st floor. If you have a lab appointment with the [facility] please come in thru the  Main Entrance and check in at the main information desk  You need to re-schedule your appointment should you arrive 10 or more minutes late.  We strive to give you quality time with our providers, and arriving late affects you and other patients whose appointments are after yours.  Also, if you no show three or more times for appointments you may be dismissed from the clinic at the providers discretion.     Again, thank you for choosing [facility].  Our hope is that these requests will decrease the amount of time that you wait before being seen by our physicians.       _____________________________________________________________  Should you have questions after your visit to [facility], please contact our office at [phone] between the hours of 8:30 a.m. and 4:30 p.m.  Voicemails left after 4:30 p.m. will not be returned until the following business day.  For prescription refill requests, have your pharmacy contact our office.         Resources For Cancer Patients and their Caregivers ? American Cancer Society: Can assist with transportation, wigs, general needs, runs Look Good Feel Better.        202-182-5552 ? Cancer Care: Provides financial assistance, online  support groups, medication/co-pay assistance.  1-800-813-HOPE (718)205-1842) ? [place] Assists [place] cancer patients and their families through emotional , educational and financial support.  201-293-4964 ? [place] DSS Where to apply for food stamps, Medicaid and utility assistance. 718-877-6999 ? [facility]: Transportation to medical appointments. 215-228-0794 ? Social Security Administration: May apply for disability if have a Stage IV cancer. 806-310-4425 503-170-9835 ? [employer], Disability and Transit Services: Assists with nutrition, care and transit needs. [place] Support Programs: @10RELATIVEDAYS @ > Cancer Support Group  2nd Tuesday of the month 1pm-2pm, Journey Room  > Creative Journey  3rd Tuesday of the month 1130am-1pm, Journey Room  > Look Good Feel Better  1st Wednesday of the month 10am-12 noon, Journey Room (Call [place] to register [phone])

## 2015-10-15 NOTE — Progress Notes (Signed)
[place]  CONSULT NOTE  Patient Care Team: Monico Blitz, MD as PCP - General (Internal Medicine)  CHIEF COMPLAINTS/PURPOSE OF CONSULTATION:  L breast cancer   Malignant neoplasm of upper inner quadrant of female breast ([name])   09/23/2015 Mammogram    Digital diagnostic mammogram and Ultrasound. Area of shadowing L breast on U/S at 12:00 1 cm from nipple corresponds to distortion on mammography. 3 adjacent masses in L breast at 11:00 could represent benign cysts, indeterminate. These are in the UI quadrant of L breast      09/30/2015 Initial Biopsy    Ultrasound guided biopsy at 12 o clock and 11 o clock position      09/30/2015 Pathology Results    12 o clock position invasive ductal carcinoma low grade, localized DCIS, cribriform type with calcifications. 11 o clock fibrotic and inflamed breast tissue DCIS  ER> 90%, PR > 90%, HER 2 IHC 2+. FISH negative      10/14/2015 Imaging    MRI breasts 2.2 cm irregular enhancing mass in the 12 o'clock region of the left breast corresponding with the biopsied low grade invasive ductal carcinoma and ductal carcinoma in-situ. 1.1 cm irregular enhancement in the 11 o'clock region of the left breast corresponding with the biopsied ductal carcinoma in-situ with fibrotic and inflamed breast tissue.        HISTORY OF PRESENTING ILLNESS:  Lindsay Pacheco 55 y.o. female is here for a consultation due to newly diagnosed left breast cancer. She notes that this was found on screening mammography and reports prior breast itching but no other symptoms, ie. No palpable abnormality.  She saw Dr. Donne Hazel in consultation on 10/09/2015. She presents today for ongoing discussion. Details of her imaging and diagnosis are noted above.   Today, she is accompanied by her sister. She didn't have insurance until this year. She went for a physical and mammogram after getting insurance and found out she was diabetic and had breast cancer. She notes  that this has been somewhat difficult to deal with but she feels she is doing well.   [facility] says her appetite is normal. She has lost weight because of a recent bout with diverticulitis and because she is dieting.  She is disabled secondary to arthritis. She notes that this limits her ADL's.    MEDICAL HISTORY:  Past Medical History:  Diagnosis Date  . Anxiety   . Arthritis    "knees" (11/02/2015)  . Breast cancer, left breast ([place]) 09/30/2015  . Depression   . Diabetes mellitus, type II ([place])   . Diverticulitis 1987  . Hypertension   . Irritable bowel syndrome (IBS)   . Sleep apnea    does not use CPAP but "I'm suppose to" (11/02/2015)   No prior breast history  SURGICAL HISTORY: Past Surgical History:  Procedure Laterality Date  . BREAST BIOPSY Left 09/2015  . BREAST LUMPECTOMY WITH AXILLARY LYMPH NODE BIOPSY Left 11/02/2015   BREAST LUMPECTOMY WITH BRACKETED RADIOACTIVE SEED AND LEFT AXILLARY SENTINEL LYMPH NODE BIOPSY   . BREAST LUMPECTOMY WITH RADIOACTIVE SEED AND SENTINEL LYMPH NODE BIOPSY Left 11/02/2015   Procedure: LEFT BREAST LUMPECTOMY WITH BRACKETED RADIOACTIVE SEED AND LEFT AXILLARY SENTINEL LYMPH NODE BIOPSY;  Surgeon: [name], MD;  Location: [facility];  Service: General;  Laterality: Left;  . EVACUATION BREAST HEMATOMA Left 11/02/2015  . EVACUATION BREAST HEMATOMA Left 11/02/2015   Procedure: EVACUATION HEMATOMA BREAST;  Surgeon: [name], MD;  Location: [facility];  Service: General;  Laterality: Left;  . JOINT REPLACEMENT    . KNEE ARTHROSCOPY Right 2000s  . LAPAROSCOPIC CHOLECYSTECTOMY  1995  . TOTAL KNEE ARTHROPLASTY Right 2011    SOCIAL HISTORY: Social History   Social History  . Marital status: Divorced    Spouse name: N/A  . Number of children: N/A  . Years of education: N/A   Occupational History  . Not on file.   Social History Main Topics  . Smoking status: Never Smoker  . Smokeless  tobacco: Never Used  . Alcohol use Yes     Comment: 11/02/2015 "maybe 4 drinks/year"  . Drug use:     Types: Marijuana     Comment: 11/02/2015 "drug use in my past; now maybe 2 puffs 3-4 times/year; for social anxiety"  . Sexual activity: Not Currently   Other Topics Concern  . Not on file   Social History Narrative  . No narrative on file  Divorced 1 child, no Gc. Daughter graduates from [employer] December 15. Never smoked Likes to watch TV and read. She has arthritis in knees and it makes her difficult to do anything.  1 cat at home Used to work in [employer], [employer]  FAMILY HISTORY: Family History  Problem Relation Age of Onset  . Anxiety disorder Father   . Depression Father   . Other Father     Abestosis  . Depression Sister   . Diabetes Mother   2 siblings both healthy  No cancer in immediate family; both parents are living (both 30 years old) One paternal aunt who had breast cancer in her 25s  ALLERGIES:  is allergic to adhesive [tape] and sulfa antibiotics.  MEDICATIONS:  Current Outpatient Prescriptions  Medication Sig Dispense Refill  . acetaminophen (TYLENOL) 500 MG tablet Take 1,000 mg by mouth every 6 (six) hours as needed for moderate pain or headache.    Marland Kitchen atorvastatin (LIPITOR) 10 MG tablet Take 10 mg by mouth daily.    . divalproex (DEPAKOTE ER) 500 MG 24 hr tablet Take 1 tablet (500 mg total) by mouth at bedtime. (Patient not taking: Reported on 11/02/2015) 30 tablet 0  . DULoxetine (CYMBALTA) 60 MG capsule Take 1 capsule (60 mg total) by mouth daily. 30 capsule 0  . lamoTRIgine (LAMICTAL) 25 MG tablet Take 50 mg by mouth daily.    Marland Kitchen lisinopril-hydrochlorothiazide (PRINZIDE,ZESTORETIC) 20-12.5 MG tablet Take 1 tablet by mouth daily.  6  . LORazepam (ATIVAN) 0.5 MG tablet Take 0.5 mg by mouth daily as needed for anxiety.    . metFORMIN (GLUCOPHAGE) 500 MG tablet Take 500 mg by mouth 2 (two) times daily with a meal.     . oxyCODONE (OXY IR/ROXICODONE) 5 MG  immediate release tablet Take 1 tablet (5 mg total) by mouth every 6 (six) hours as needed for moderate pain, severe pain or breakthrough pain. 20 tablet 0   No current facility-administered medications for this visit.     Review of Systems  Constitutional: Negative.   HENT: Negative.   Eyes: Negative.   Respiratory: Negative.   Cardiovascular: Negative.   Gastrointestinal: Negative.   Genitourinary: Negative.   Musculoskeletal: Positive for back pain and joint pain.  Skin: Negative.   Neurological: Negative.   Endo/Heme/Allergies: Negative.   Psychiatric/Behavioral: Negative.   All other systems reviewed and are negative. 14 point ROS was done and is otherwise as detailed above or in HPI   PHYSICAL EXAMINATION: ECOG PERFORMANCE STATUS: 0 - Asymptomatic  Vitals with  BMI 10/14/2015  Height 5' 7.5"  Weight 274 lbs 6 oz  BMI 18.2  Systolic 993  Diastolic 79  Pulse 99  Respirations 16    Physical Exam  Constitutional: She is oriented to person, place, and time and well-developed, well-nourished, and in no distress.  HENT:  Head: Normocephalic and atraumatic.  Nose: Nose normal.  Mouth/Throat: Oropharynx is clear and moist. No oropharyngeal exudate.  Eyes: Conjunctivae and EOM are normal. Pupils are equal, round, and reactive to light. Right eye exhibits no discharge. Left eye exhibits no discharge. No scleral icterus.  Neck: Normal range of motion. Neck supple. No tracheal deviation present. No thyromegaly present.  Cardiovascular: Normal rate, regular rhythm and normal heart sounds.  Exam reveals no gallop and no friction rub.   No murmur heard. Pulmonary/Chest: Effort normal and breath sounds normal. She has no wheezes. She has no rales.  Abdominal: Soft. Bowel sounds are normal. She exhibits no distension and no mass. There is no tenderness. There is no rebound and no guarding.  Musculoskeletal: Normal range of motion. She exhibits no edema.  Lymphadenopathy:    She has  no cervical adenopathy.  Neurological: She is alert and oriented to person, place, and time. She has normal reflexes. No cranial nerve deficit. Gait normal. Coordination normal.  Skin: Skin is warm and dry. No rash noted.  Psychiatric: Mood, memory, affect and judgment normal.  Nursing note and vitals reviewed.   LABORATORY DATA:  I have reviewed the data as listed Lab Results  Component Value Date   WBC 11.5 (H) 11/02/2015   HGB 9.9 (L) 11/02/2015   HCT 30.5 (L) 11/02/2015   MCV 83.3 11/02/2015   PLT 252 11/02/2015   CMP     Component Value Date/Time   NA 137 10/28/2015 1311   K 3.6 10/28/2015 1311   CL 103 10/28/2015 1311   CO2 23 10/28/2015 1311   GLUCOSE 176 (H) 10/28/2015 1311   BUN 8 10/28/2015 1311   CREATININE 0.92 10/28/2015 1311   CALCIUM 9.5 10/28/2015 1311   PROT 7.3 10/06/2009 1040   ALBUMIN 3.9 10/06/2009 1040   AST 22 10/06/2009 1040   ALT 22 10/06/2009 1040   ALKPHOS 120 (H) 10/06/2009 1040   BILITOT 0.6 10/06/2009 1040   GFRNONAA >60 10/28/2015 1311   GFRAA >60 10/28/2015 1311     RADIOGRAPHIC STUDIES: I have personally reviewed the radiological images as listed and agreed with the findings in the report. No results found. PATHOLOGY:  09/30/15    ASSESSMENT & PLAN:  Malignant neoplasm of upper inner quadrant of female breast [facility])   Staging form: Breast, AJCC 7th Edition   - Clinical stage from 10/14/2015: Stage IIA (T2, N0, M0) - Signed by Kyung Rudd, MD on 10/14/2015 ER+ PR+ HER 2 - Arthritis Diverticulitis Diabetes  Patient is pleasant and well. I discussed her diagnosis in detail  and explained to her the most common type of breast cancers are ductal carcinomas. We discussed lumpectomy/XRT vs. Mastectomy. She prefers to have a lumpectomy.We reviewed the sentinel node procedure as she had questions. I briefed the patient on hormone receptor assays and HER2. She was told that we would not know if she would need an oncotype or mammoprint until  after her surgery. I advised her that this would help guide [place] as to whether or not chemotherapy would be of benefit at reducing recurrence. Patient asked about the difference between chemotherapy and radiation. I explained each of these to her.  Asher was given a comprehensive breast cancer handbook to take home with her.   She met with out patient navigator, Anderson Malta.  Follow up with patient after lumpectomy.Will also make sure pathology is reviewed once available and sent for appropriate testing.  This document serves as a record of services personally performed by Ancil Linsey, MD. It was created on her behalf by Elmyra Ricks, a trained medical scribe. The creation of this record is based on the scribe's personal observations and the provider's statements to them. This document has been checked and approved by the attending provider.  I have reviewed the above documentation for accuracy and completeness and I agree with the above.   This note was electronically signed.    Molli Hazard, MD  11/08/2015 2:22 PM

## 2015-10-16 NOTE — Progress Notes (Signed)
Met with pt face to face.  Introduced myself and explain a little bit about my role as the patient navigator.  Pt given a card with all my information on it.  Told pt to call if they had any questions or concerns.  Pt verbalized understanding.   Told pt to call me when she knows her surgery date and I will let her know when her follow up appt will be. Pt verbalized understanding.

## 2015-10-21 ENCOUNTER — Ambulatory Visit (INDEPENDENT_AMBULATORY_CARE_PROVIDER_SITE_OTHER): Payer: Medicare Other | Admitting: Psychiatry

## 2015-10-21 ENCOUNTER — Other Ambulatory Visit: Payer: Self-pay | Admitting: General Surgery

## 2015-10-21 ENCOUNTER — Encounter (HOSPITAL_COMMUNITY): Payer: Self-pay | Admitting: Psychiatry

## 2015-10-21 DIAGNOSIS — F331 Major depressive disorder, recurrent, moderate: Secondary | ICD-10-CM

## 2015-10-21 DIAGNOSIS — C50912 Malignant neoplasm of unspecified site of left female breast: Secondary | ICD-10-CM

## 2015-10-21 DIAGNOSIS — Z170 Estrogen receptor positive status [ER+]: Principal | ICD-10-CM
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
## 2015-10-21 NOTE — Progress Notes (Signed)
Patient:  Lindsay Pacheco   DOB: 03-Apr-1960  MR Number: [id number]  Location: [facility]:  [address] [place],  [place], [address]  Start: Wednesday 10/21/2015 8:10 AM  End: Wednesday 10/21/2015 9:00 AM     Provider/Observer:     Maurice Small, MSW, LCSW   Chief Complaint:      No chief complaint on file.   Reason For Service:  Patient is a 55 year old female referred for services by psychiatrist [name] to improve coping skills. She presents with a long standing history of symptoms of depression and anxiety that began over 20 years ago. Symptoms have worsened in the past 3 years since mother moved in with patient as mother is vey confrontational. Mother also expereinces confusion and memory problems per patient's report.  She reports additional stress related to concerns about her daughter. Patient also reports discord with sister and her father. Patient reports 1 psychiatriac hospitalization due to suicide attempt in 2012. She has participated in outpatient medication management at [facility] and currently is seeing psychiatrist [name] at [facility] in [place]. She also participated in IOP.  She participated in outpatient therapy briefly in this practice several years ago until she lost her insurance.  Patient reports crying spells, excessive worry, panic attacks, nervousness, low energy, sleep difficulty due to sleep apnea, memory problmes, loss of interest , irritabiliy, ruminating thoughts.     Interventions Strategy:  Supportive  Participation Level:   Active  Participation Quality:  Appropriate      Behavioral Observation:  Casual, Alert, and Appropriate.   Current Psychosocial Factors: Recent diagnosis of breast cancer, stressful relationship with mother, father, and sister, worries about daughter in college, financial stress   Content of Session:    reviewed symptoms, administered pH Q-9, facilitated expression of feelings and thoughts regarding recent appointments  and treatment plan for diagnosis of breast cancer, assisted patient identify strengths and to use support system, praised patient's efforts to use controlled breathing, discussed rationale for practicing controlled breathing consistently, assigned patient to practice controlled breathing 5 minutes 2 times per day, began to develop treatment plan  Current Status:   Less depressed mood, panic attacks, nervousness, low energy, sleep difficulty, ruminating thoughts, irritability  Suicidal/Homicidal:   No  Patient Progress:   Fair. Patient reports she has met with 2 oncologists and a surgeon since last session. She reports MRI indicated she has stage I breast cancer. She is waiting for a call from surgeon's office to schedule a lumpectomy. Patient reports being optimistic about treatment and expresses continued confidence in her medical providers. Patient plans to join a cancer support group and also plans to volunteer for participation in an upcoming yearly benefit event for cancer survivors. She reports sister has been very supportive and has attended medical appointments with patient. She expresses less worry about mother's reaction as sister helped patient set boundaries with mother regarding attending appointments. Patient has continued to have strong support from her friends. She reports stress regarding her daughter who came home this past weekend. She expresses frustration and resentment regarding daughter is daughter was argumentative and critical. Patient reports using controlled breathing to try to calm self.  Target Goals:   1. Learn and implement calming strategies  Last Reviewed:     Goals Addressed Today:   1,2     Plan:   Return again in 2 weeks. Patient agrees to practice controlled breathing 5 minutes 2 times per day.  Impression/Diagnosis:   Patient  presents with  a long standing history of symptoms of depression and anxiety that began over 20 years ago. Symptoms have worsened in the  past 3 years since mother moved in with patient as mother is vey confrontational. She reports 1 psychiatriac hospitalization due to suicide attempt in 2012. She has participated in outpatient medication management at [facility] and currently is seeing psychiatrist [name] at [facility] in [place]. She also participated in IOP.  She participated in outpatient therapy briefly in this practice several years ago until she lost her insurance.  Patient reports crying spells, excessive worry, panic attacks, nervousness, low energy, sleep difficulty due to sleep apnea, memory problmes, loss of interest , irritabiliy, and ruminating thoughts.      Diagnosis:  Axis I: Major Depressive Disorder, Recurrent, Moderate          Axis II: Deferred

## 2015-10-22 ENCOUNTER — Ambulatory Visit (INDEPENDENT_AMBULATORY_CARE_PROVIDER_SITE_OTHER): Payer: Medicare Other | Admitting: Psychiatry

## 2015-10-22 ENCOUNTER — Ambulatory Visit (HOSPITAL_COMMUNITY): Payer: Self-pay | Admitting: Psychiatry

## 2015-10-22 ENCOUNTER — Encounter (HOSPITAL_COMMUNITY): Payer: Self-pay | Admitting: Psychiatry

## 2015-10-22 ENCOUNTER — Other Ambulatory Visit: Payer: Self-pay

## 2015-10-22 DIAGNOSIS — Z833 Family history of diabetes mellitus: Secondary | ICD-10-CM | POA: Diagnosis not present

## 2015-10-22 DIAGNOSIS — F331 Major depressive disorder, recurrent, moderate: Secondary | ICD-10-CM | POA: Insufficient documentation

## 2015-10-22 DIAGNOSIS — Z79899 Other long term (current) drug therapy: Secondary | ICD-10-CM

## 2015-10-22 DIAGNOSIS — Z818 Family history of other mental and behavioral disorders: Secondary | ICD-10-CM | POA: Diagnosis not present

## 2015-10-22 MED ORDER — DIVALPROEX SODIUM ER 500 MG PO TB24: 500.0000 mg | ORAL_TABLET | Freq: Every day | ORAL | 0 refills | Status: DC

## 2015-10-22 MED ORDER — DULOXETINE HCL 60 MG PO CPEP: 60.0000 mg | ORAL_CAPSULE | Freq: Every day | ORAL | 0 refills | Status: DC

## 2015-10-22 NOTE — Patient Instructions (Signed)
1. Start Depakote 500 mg at night 2. Continue duloxetine 60 mg daily 3. Continue to see Ms. Peggy for therapy 4. Return to clinic in 1 month 5. Please fax your blood test to the clinic. You will need a test for liver function and thyroid if that is note done.

## 2015-10-23 ENCOUNTER — Other Ambulatory Visit: Payer: Self-pay

## 2015-10-26 ENCOUNTER — Encounter (HOSPITAL_BASED_OUTPATIENT_CLINIC_OR_DEPARTMENT_OTHER): Payer: Self-pay | Admitting: *Deleted

## 2015-10-26 ENCOUNTER — Telehealth (HOSPITAL_COMMUNITY): Payer: Self-pay | Admitting: Emergency Medicine

## 2015-10-26 NOTE — Progress Notes (Signed)
   10/26/15 1220  OBSTRUCTIVE SLEEP APNEA  Have you ever been diagnosed with sleep apnea through a sleep study? Yes  If yes, do you have and use a CPAP or BPAP machine every night? 0  Do you snore loudly (loud enough to be heard through closed doors)?  1  Do you often feel tired, fatigued, or sleepy during the daytime (such as falling asleep during driving or talking to someone)? 0  Has anyone observed you stop breathing during your sleep? 0  Do you have, or are you being treated for high blood pressure? 1  BMI more than 35 kg/m2? 1  Age > 14 (1-yes) 1  Female Gender (Yes=1) 0  Obstructive Sleep Apnea Score 4

## 2015-10-26 NOTE — Telephone Encounter (Signed)
Called pt to let her know that I made her follow up appt after she has surgery on 11/02/2015 with Dr Donne Hazel.  I will send off for oncotype vs. mammoprint once pathology comes back from surgery.  Then it will take about 14 days for that to result.  I made her appt for  11/25/2015 at 8:50 am.

## 2015-10-28 ENCOUNTER — Other Ambulatory Visit: Payer: Self-pay

## 2015-10-28 ENCOUNTER — Encounter (HOSPITAL_BASED_OUTPATIENT_CLINIC_OR_DEPARTMENT_OTHER)
Admission: RE | Admit: 2015-10-28 | Discharge: 2015-10-28 | Disposition: A | Discharge status: Home / Self Care | Payer: Medicare Other | Source: Ambulatory Visit | Attending: General Surgery | Admitting: General Surgery

## 2015-10-28 ENCOUNTER — Other Ambulatory Visit (HOSPITAL_COMMUNITY)
Admission: RE | Admit: 2015-10-28 | Discharge: 2015-10-28 | Disposition: A | Discharge status: Home / Self Care | Payer: Medicare Other | Source: Ambulatory Visit | Attending: Anesthesiology | Admitting: Anesthesiology

## 2015-10-28 DIAGNOSIS — C50212 Malignant neoplasm of upper-inner quadrant of left female breast: Secondary | ICD-10-CM | POA: Insufficient documentation

## 2015-10-28 LAB — BASIC METABOLIC PANEL
ANION GAP: 11 (ref 5–15)
BUN: 8 mg/dL (ref 6–20)
CALCIUM: 9.5 mg/dL (ref 8.9–10.3)
CO2: 23 mmol/L (ref 22–32)
CREATININE: 0.92 mg/dL (ref 0.44–1.00)
Chloride: 103 mmol/L (ref 101–111)
GFR calc Af Amer: >60 mL/min (ref >60)
GLUCOSE: 176 mg/dL — AB (ref 65–99)
Potassium: 3.6 mmol/L (ref 3.5–5.1)
Sodium: 137 mmol/L (ref 135–145)

## 2015-10-28 NOTE — Progress Notes (Signed)
EKG completed with noted abnormalities, pt new diabetic with severe sleep apnea and does not wear CPAP. Called anesthesia to review and Dr. Rodman Comp said " someone will review it tomorrow".  Informed pt that someone will call her tomorrow reguarding her EKG.  Pt verbalized understanding. Gave pt water with crystal light with instructions, pt verbalized understanding.

## 2015-10-29 NOTE — Progress Notes (Signed)
ekg review by Dr Veatrice Kells no further testing needed

## 2015-10-30 ENCOUNTER — Ambulatory Visit
Admission: RE | Admit: 2015-10-30 | Discharge: 2015-10-30 | Disposition: A | Discharge status: Home / Self Care | Payer: Medicare Other | Source: Ambulatory Visit | Attending: General Surgery | Admitting: General Surgery

## 2015-10-30 DIAGNOSIS — Z170 Estrogen receptor positive status [ER+]: Principal | ICD-10-CM

## 2015-10-30 DIAGNOSIS — C50912 Malignant neoplasm of unspecified site of left female breast: Secondary | ICD-10-CM

## 2015-11-02 ENCOUNTER — Ambulatory Visit (HOSPITAL_BASED_OUTPATIENT_CLINIC_OR_DEPARTMENT_OTHER): Payer: Medicare Other | Admitting: Certified Registered"

## 2015-11-02 ENCOUNTER — Ambulatory Visit (HOSPITAL_BASED_OUTPATIENT_CLINIC_OR_DEPARTMENT_OTHER): Admit: 2015-11-02 | Payer: Medicare Other | Admitting: General Surgery

## 2015-11-02 ENCOUNTER — Encounter (HOSPITAL_COMMUNITY)
Admission: RE | Disposition: A | Discharge status: Home / Self Care | Payer: Self-pay | Source: Ambulatory Visit | Attending: General Surgery

## 2015-11-02 ENCOUNTER — Ambulatory Visit
Admission: RE | Admit: 2015-11-02 | Discharge: 2015-11-02 | Disposition: A | Discharge status: Home / Self Care | Payer: Medicare Other | Source: Ambulatory Visit | Attending: General Surgery | Admitting: General Surgery

## 2015-11-02 ENCOUNTER — Ambulatory Visit (HOSPITAL_COMMUNITY)
Admission: RE | Admit: 2015-11-02 | Discharge: 2015-11-02 | Disposition: A | Discharge status: Home / Self Care | Payer: Medicare Other | Source: Ambulatory Visit | Attending: General Surgery | Admitting: General Surgery

## 2015-11-02 ENCOUNTER — Encounter (HOSPITAL_BASED_OUTPATIENT_CLINIC_OR_DEPARTMENT_OTHER): Payer: Self-pay

## 2015-11-02 ENCOUNTER — Observation Stay (HOSPITAL_BASED_OUTPATIENT_CLINIC_OR_DEPARTMENT_OTHER)
Admission: RE | Admit: 2015-11-02 | Discharge: 2015-11-03 | Disposition: A | Discharge status: Home / Self Care | Payer: Medicare Other | Source: Ambulatory Visit | Attending: General Surgery | Admitting: General Surgery

## 2015-11-02 DIAGNOSIS — Z882 Allergy status to sulfonamides status: Secondary | ICD-10-CM | POA: Diagnosis not present

## 2015-11-02 DIAGNOSIS — Z170 Estrogen receptor positive status [ER+]: Secondary | ICD-10-CM | POA: Diagnosis not present

## 2015-11-02 DIAGNOSIS — Y848 Other medical procedures as the cause of abnormal reaction of the patient, or of later complication, without mention of misadventure at the time of the procedure: Secondary | ICD-10-CM | POA: Insufficient documentation

## 2015-11-02 DIAGNOSIS — F329 Major depressive disorder, single episode, unspecified: Secondary | ICD-10-CM | POA: Insufficient documentation

## 2015-11-02 DIAGNOSIS — Z6841 Body Mass Index (BMI) 40.0 and over, adult: Secondary | ICD-10-CM | POA: Insufficient documentation

## 2015-11-02 DIAGNOSIS — Y753 Surgical instruments, materials and neurological devices (including sutures) associated with adverse incidents: Secondary | ICD-10-CM | POA: Insufficient documentation

## 2015-11-02 DIAGNOSIS — N6022 Fibroadenosis of left breast: Secondary | ICD-10-CM | POA: Diagnosis not present

## 2015-11-02 DIAGNOSIS — Z7984 Long term (current) use of oral hypoglycemic drugs: Secondary | ICD-10-CM | POA: Insufficient documentation

## 2015-11-02 DIAGNOSIS — L7632 Postprocedural hematoma of skin and subcutaneous tissue following other procedure: Secondary | ICD-10-CM | POA: Diagnosis not present

## 2015-11-02 DIAGNOSIS — N6012 Diffuse cystic mastopathy of left breast: Secondary | ICD-10-CM | POA: Insufficient documentation

## 2015-11-02 DIAGNOSIS — G4730 Sleep apnea, unspecified: Secondary | ICD-10-CM | POA: Insufficient documentation

## 2015-11-02 DIAGNOSIS — I10 Essential (primary) hypertension: Secondary | ICD-10-CM | POA: Insufficient documentation

## 2015-11-02 DIAGNOSIS — K589 Irritable bowel syndrome without diarrhea: Secondary | ICD-10-CM | POA: Diagnosis not present

## 2015-11-02 DIAGNOSIS — C50212 Malignant neoplasm of upper-inner quadrant of left female breast: Secondary | ICD-10-CM | POA: Diagnosis not present

## 2015-11-02 DIAGNOSIS — M1990 Unspecified osteoarthritis, unspecified site: Secondary | ICD-10-CM | POA: Diagnosis not present

## 2015-11-02 DIAGNOSIS — Z96651 Presence of right artificial knee joint: Secondary | ICD-10-CM | POA: Insufficient documentation

## 2015-11-02 DIAGNOSIS — C50912 Malignant neoplasm of unspecified site of left female breast: Secondary | ICD-10-CM | POA: Diagnosis present

## 2015-11-02 DIAGNOSIS — E119 Type 2 diabetes mellitus without complications: Secondary | ICD-10-CM | POA: Insufficient documentation

## 2015-11-02 DIAGNOSIS — D242 Benign neoplasm of left breast: Secondary | ICD-10-CM | POA: Diagnosis not present

## 2015-11-02 DIAGNOSIS — F419 Anxiety disorder, unspecified: Secondary | ICD-10-CM | POA: Diagnosis not present

## 2015-11-02 HISTORY — PX: EVACUATION BREAST HEMATOMA: SHX1537

## 2015-11-02 HISTORY — PX: BREAST LUMPECTOMY WITH AXILLARY LYMPH NODE BIOPSY: SHX5593

## 2015-11-02 HISTORY — PX: BREAST LUMPECTOMY: SHX2

## 2015-11-02 HISTORY — PX: BREAST LUMPECTOMY WITH RADIOACTIVE SEED AND SENTINEL LYMPH NODE BIOPSY: SHX6550

## 2015-11-02 HISTORY — DX: Malignant neoplasm of unspecified site of left female breast: C50.912

## 2015-11-02 LAB — GLUCOSE, CAPILLARY
GLUCOSE-CAPILLARY: 259 mg/dL — AB (ref 65–99)
GLUCOSE-CAPILLARY: 264 mg/dL — AB (ref 65–99)
Glucose-Capillary: 119 mg/dL — ABNORMAL HIGH (ref 65–99)
Glucose-Capillary: 135 mg/dL — ABNORMAL HIGH (ref 65–99)
Glucose-Capillary: 257 mg/dL — ABNORMAL HIGH (ref 65–99)

## 2015-11-02 LAB — CBC
HEMATOCRIT: 30.5 % — AB (ref 36.0–46.0)
HEMOGLOBIN: 9.9 g/dL — AB (ref 12.0–15.0)
MCH: 27 pg (ref 26.0–34.0)
MCHC: 32.5 g/dL (ref 30.0–36.0)
MCV: 83.3 fL (ref 78.0–100.0)
Platelets: 252 10*3/uL (ref 150–400)
RBC: 3.66 MIL/uL — ABNORMAL LOW (ref 3.87–5.11)
RDW: 13.8 % (ref 11.5–15.5)
WBC: 11.5 10*3/uL — AB (ref 4.0–10.5)

## 2015-11-02 SURGERY — EVACUATION, HEMATOMA, BREAST
Anesthesia: General | Site: Breast | Laterality: Left

## 2015-11-02 SURGERY — BREAST LUMPECTOMY WITH RADIOACTIVE SEED AND SENTINEL LYMPH NODE BIOPSY
Anesthesia: General | Site: Breast | Laterality: Left

## 2015-11-02 MED ORDER — SUCCINYLCHOLINE CHLORIDE 200 MG/10ML IV SOSY
PREFILLED_SYRINGE | INTRAVENOUS | Status: AC
  Filled 2015-11-02: qty 10

## 2015-11-02 MED ORDER — BUPIVACAINE HCL (PF) 0.25 % IJ SOLN
INTRAMUSCULAR | Status: DC | PRN
  Administered 2015-11-02: 12 mL

## 2015-11-02 MED ORDER — BUPIVACAINE-EPINEPHRINE 0.25% -1:200000 IJ SOLN
INTRAMUSCULAR | Status: AC
  Filled 2015-11-02: qty 1

## 2015-11-02 MED ORDER — ONDANSETRON HCL 4 MG/2ML IJ SOLN
4.0000 mg | Freq: Four times a day (QID) | INTRAMUSCULAR | Status: DC | PRN
  Administered 2015-11-02: 4 mg via INTRAVENOUS

## 2015-11-02 MED ORDER — SCOPOLAMINE 1 MG/3DAYS TD PT72: 1.0000 | MEDICATED_PATCH | Freq: Once | TRANSDERMAL | Status: DC | PRN

## 2015-11-02 MED ORDER — DEXAMETHASONE SODIUM PHOSPHATE 10 MG/ML IJ SOLN
INTRAMUSCULAR | Status: AC
  Filled 2015-11-02: qty 1

## 2015-11-02 MED ORDER — OXYCODONE HCL 5 MG PO TABS: 5.0000 mg | ORAL_TABLET | Freq: Four times a day (QID) | ORAL | 0 refills | Status: DC | PRN

## 2015-11-02 MED ORDER — CEFAZOLIN SODIUM-DEXTROSE 2-4 GM/100ML-% IV SOLN
2.0000 g | INTRAVENOUS | Status: AC
  Administered 2015-11-02: 2 g via INTRAVENOUS

## 2015-11-02 MED ORDER — MIDAZOLAM HCL 2 MG/2ML IJ SOLN
1.0000 mg | INTRAMUSCULAR | Status: DC | PRN
  Administered 2015-11-02 (×2): 1 mg via INTRAVENOUS
  Administered 2015-11-02: 2 mg via INTRAVENOUS

## 2015-11-02 MED ORDER — FENTANYL CITRATE (PF) 100 MCG/2ML IJ SOLN
INTRAMUSCULAR | Status: AC
  Filled 2015-11-02: qty 2

## 2015-11-02 MED ORDER — GABAPENTIN 300 MG PO CAPS
300.0000 mg | ORAL_CAPSULE | ORAL | Status: AC
  Administered 2015-11-02: 300 mg via ORAL

## 2015-11-02 MED ORDER — DEXAMETHASONE SODIUM PHOSPHATE 4 MG/ML IJ SOLN
INTRAMUSCULAR | Status: DC | PRN
  Administered 2015-11-02: 4 mg via INTRAVENOUS

## 2015-11-02 MED ORDER — DIVALPROEX SODIUM ER 500 MG PO TB24
500.0000 mg | ORAL_TABLET | Freq: Every day | ORAL | Status: DC
  Filled 2015-11-02: qty 1

## 2015-11-02 MED ORDER — PHENYLEPHRINE HCL 10 MG/ML IJ SOLN
INTRAMUSCULAR | Status: DC | PRN
  Administered 2015-11-02 (×6): 80 ug via INTRAVENOUS

## 2015-11-02 MED ORDER — MIDAZOLAM HCL 2 MG/2ML IJ SOLN
INTRAMUSCULAR | Status: AC
  Filled 2015-11-02: qty 2

## 2015-11-02 MED ORDER — METHYLENE BLUE 0.5 % INJ SOLN
INTRAVENOUS | Status: AC
  Filled 2015-11-02: qty 10

## 2015-11-02 MED ORDER — ONDANSETRON 4 MG PO TBDP: 4.0000 mg | ORAL_TABLET | Freq: Four times a day (QID) | ORAL | Status: DC | PRN

## 2015-11-02 MED ORDER — PROPOFOL 10 MG/ML IV BOLUS
INTRAVENOUS | Status: DC | PRN
  Administered 2015-11-02: 200 mg via INTRAVENOUS

## 2015-11-02 MED ORDER — FENTANYL CITRATE (PF) 100 MCG/2ML IJ SOLN
50.0000 ug | INTRAMUSCULAR | Status: AC | PRN
  Administered 2015-11-02: 25 ug via INTRAVENOUS
  Administered 2015-11-02: 100 ug via INTRAVENOUS
  Administered 2015-11-02: 25 ug via INTRAVENOUS
  Administered 2015-11-02: 50 ug via INTRAVENOUS

## 2015-11-02 MED ORDER — ONDANSETRON HCL 4 MG/2ML IJ SOLN
INTRAMUSCULAR | Status: AC
  Filled 2015-11-02: qty 2

## 2015-11-02 MED ORDER — LIDOCAINE 2% (20 MG/ML) 5 ML SYRINGE
INTRAMUSCULAR | Status: AC
  Filled 2015-11-02: qty 5

## 2015-11-02 MED ORDER — ACETAMINOPHEN 650 MG RE SUPP: 650.0000 mg | Freq: Four times a day (QID) | RECTAL | Status: DC | PRN

## 2015-11-02 MED ORDER — SIMETHICONE 80 MG PO CHEW: 40.0000 mg | CHEWABLE_TABLET | Freq: Four times a day (QID) | ORAL | Status: DC | PRN

## 2015-11-02 MED ORDER — SODIUM CHLORIDE 0.9 % IV SOLN
INTRAVENOUS | Status: DC
  Administered 2015-11-02 (×2): via INTRAVENOUS

## 2015-11-02 MED ORDER — OXYCODONE HCL 5 MG PO TABS
5.0000 mg | ORAL_TABLET | ORAL | Status: DC | PRN
  Administered 2015-11-02: 5 mg via ORAL
  Administered 2015-11-02: 10 mg via ORAL
  Filled 2015-11-02: qty 1
  Filled 2015-11-02: qty 2

## 2015-11-02 MED ORDER — GLYCOPYRROLATE 0.2 MG/ML IJ SOLN: 0.2000 mg | Freq: Once | INTRAMUSCULAR | Status: DC | PRN

## 2015-11-02 MED ORDER — LISINOPRIL-HYDROCHLOROTHIAZIDE 20-12.5 MG PO TABS: 1.0000 | ORAL_TABLET | Freq: Every day | ORAL | Status: DC

## 2015-11-02 MED ORDER — PHENYLEPHRINE HCL 10 MG/ML IJ SOLN
INTRAMUSCULAR | Status: DC | PRN
  Administered 2015-11-02 (×3): 40 ug via INTRAVENOUS

## 2015-11-02 MED ORDER — EPHEDRINE 5 MG/ML INJ
INTRAVENOUS | Status: AC
  Filled 2015-11-02: qty 10

## 2015-11-02 MED ORDER — ACETAMINOPHEN 500 MG PO TABS
ORAL_TABLET | ORAL | Status: AC
  Filled 2015-11-02: qty 2

## 2015-11-02 MED ORDER — LACTATED RINGERS IV SOLN
INTRAVENOUS | Status: DC | PRN
  Administered 2015-11-02: 15:00:00 via INTRAVENOUS

## 2015-11-02 MED ORDER — ACETAMINOPHEN 325 MG PO TABS: 650.0000 mg | ORAL_TABLET | Freq: Four times a day (QID) | ORAL | Status: DC | PRN

## 2015-11-02 MED ORDER — PROPOFOL 10 MG/ML IV BOLUS
INTRAVENOUS | Status: AC
  Filled 2015-11-02: qty 40

## 2015-11-02 MED ORDER — LISINOPRIL 20 MG PO TABS
20.0000 mg | ORAL_TABLET | Freq: Every day | ORAL | Status: DC
  Administered 2015-11-03: 20 mg via ORAL
  Filled 2015-11-02: qty 1

## 2015-11-02 MED ORDER — EPHEDRINE SULFATE 50 MG/ML IJ SOLN
INTRAMUSCULAR | Status: DC | PRN
  Administered 2015-11-02 (×5): 10 mg via INTRAVENOUS

## 2015-11-02 MED ORDER — HYDROCHLOROTHIAZIDE 12.5 MG PO CAPS
12.5000 mg | ORAL_CAPSULE | Freq: Every day | ORAL | Status: DC
  Administered 2015-11-03: 12.5 mg via ORAL
  Filled 2015-11-02: qty 1

## 2015-11-02 MED ORDER — LACTATED RINGERS IV SOLN
INTRAVENOUS | Status: DC
  Administered 2015-11-02 (×2): via INTRAVENOUS

## 2015-11-02 MED ORDER — MIDAZOLAM HCL 5 MG/5ML IJ SOLN
INTRAMUSCULAR | Status: DC | PRN
  Administered 2015-11-02: 2 mg via INTRAVENOUS

## 2015-11-02 MED ORDER — CEFAZOLIN SODIUM-DEXTROSE 2-4 GM/100ML-% IV SOLN
INTRAVENOUS | Status: AC
  Filled 2015-11-02: qty 100

## 2015-11-02 MED ORDER — FENTANYL CITRATE (PF) 100 MCG/2ML IJ SOLN
25.0000 ug | INTRAMUSCULAR | Status: DC | PRN
  Administered 2015-11-02 (×2): 25 ug via INTRAVENOUS

## 2015-11-02 MED ORDER — SODIUM CHLORIDE 0.9 % IJ SOLN
INTRAMUSCULAR | Status: AC
  Filled 2015-11-02: qty 10

## 2015-11-02 MED ORDER — LIDOCAINE HCL (CARDIAC) 20 MG/ML IV SOLN
INTRAVENOUS | Status: DC | PRN
  Administered 2015-11-02: 30 mg via INTRAVENOUS

## 2015-11-02 MED ORDER — HYDROCODONE-ACETAMINOPHEN 7.5-325 MG PO TABS: 1.0000 | ORAL_TABLET | Freq: Once | ORAL | Status: DC | PRN

## 2015-11-02 MED ORDER — GABAPENTIN 300 MG PO CAPS
ORAL_CAPSULE | ORAL | Status: AC
  Filled 2015-11-02: qty 1

## 2015-11-02 MED ORDER — MORPHINE SULFATE (PF) 2 MG/ML IV SOLN
2.0000 mg | INTRAVENOUS | Status: DC | PRN
  Administered 2015-11-02: 1 mg via INTRAVENOUS
  Administered 2015-11-02: 2 mg via INTRAVENOUS
  Filled 2015-11-02 (×2): qty 1

## 2015-11-02 MED ORDER — ACETAMINOPHEN 500 MG PO TABS
1000.0000 mg | ORAL_TABLET | ORAL | Status: AC
  Administered 2015-11-02: 1000 mg via ORAL

## 2015-11-02 MED ORDER — MEPERIDINE HCL 25 MG/ML IJ SOLN: 6.2500 mg | INTRAMUSCULAR | Status: DC | PRN

## 2015-11-02 MED ORDER — MORPHINE SULFATE (PF) 2 MG/ML IV SOLN: 1.0000 mg | Freq: Once | INTRAVENOUS | Status: DC

## 2015-11-02 MED ORDER — EPHEDRINE SULFATE 50 MG/ML IJ SOLN
INTRAMUSCULAR | Status: DC | PRN
  Administered 2015-11-02 (×9): 10 mg via INTRAVENOUS

## 2015-11-02 MED ORDER — METOCLOPRAMIDE HCL 5 MG/ML IJ SOLN: 10.0000 mg | Freq: Once | INTRAMUSCULAR | Status: DC | PRN

## 2015-11-02 MED ORDER — ONDANSETRON HCL 4 MG/2ML IJ SOLN
INTRAMUSCULAR | Status: DC | PRN
  Administered 2015-11-02: 4 mg via INTRAVENOUS

## 2015-11-02 MED ORDER — CEFAZOLIN SODIUM-DEXTROSE 2-3 GM-% IV SOLR
INTRAVENOUS | Status: DC | PRN
  Administered 2015-11-02: 2 g via INTRAVENOUS

## 2015-11-02 MED ORDER — BUPIVACAINE HCL (PF) 0.25 % IJ SOLN
INTRAMUSCULAR | Status: AC
  Filled 2015-11-02: qty 30

## 2015-11-02 MED ORDER — INSULIN ASPART 100 UNIT/ML ~~LOC~~ SOLN
0.0000 [IU] | Freq: Three times a day (TID) | SUBCUTANEOUS | Status: DC
  Administered 2015-11-02: 8 [IU] via SUBCUTANEOUS
  Administered 2015-11-03: 5 [IU] via SUBCUTANEOUS

## 2015-11-02 MED ORDER — METHOCARBAMOL 500 MG PO TABS
500.0000 mg | ORAL_TABLET | Freq: Four times a day (QID) | ORAL | Status: DC | PRN
Start: 2015-11-02 — End: 2015-11-03
  Administered 2015-11-02: 500 mg via ORAL
  Filled 2015-11-02: qty 1

## 2015-11-02 MED ORDER — TECHNETIUM TC 99M SULFUR COLLOID FILTERED
1.0000 | Freq: Once | INTRAVENOUS | Status: AC | PRN
  Administered 2015-11-02: 1 via INTRADERMAL

## 2015-11-02 MED ORDER — DULOXETINE HCL 60 MG PO CPEP
60.0000 mg | ORAL_CAPSULE | Freq: Every day | ORAL | Status: DC
  Administered 2015-11-02 – 2015-11-03 (×2): 60 mg via ORAL
  Filled 2015-11-02: qty 1

## 2015-11-02 SURGICAL SUPPLY — 65 items
ADH SKN CLS APL DERMABOND .7 (GAUZE/BANDAGES/DRESSINGS)
APPLIER CLIP 11 MED OPEN (CLIP) ×3
APR CLP MED 11 20 MLT OPN (CLIP) ×1
BINDER BREAST LRG (GAUZE/BANDAGES/DRESSINGS) IMPLANT
BINDER BREAST MEDIUM (GAUZE/BANDAGES/DRESSINGS) IMPLANT
BINDER BREAST XLRG (GAUZE/BANDAGES/DRESSINGS) IMPLANT
BINDER BREAST XXLRG (GAUZE/BANDAGES/DRESSINGS) ×2 IMPLANT
BLADE SURG 15 STRL LF DISP TIS (BLADE) ×1 IMPLANT
BLADE SURG 15 STRL SS (BLADE) ×3
CANISTER SUCT 1200ML W/VALVE (MISCELLANEOUS) IMPLANT
CHLORAPREP W/TINT 26ML (MISCELLANEOUS) ×3 IMPLANT
CLIP APPLIE 11 MED OPEN (CLIP) IMPLANT
CLIP TI WIDE RED SMALL 6 (CLIP) IMPLANT
CLOSURE WOUND 1/2 X4 (GAUZE/BANDAGES/DRESSINGS)
COVER BACK TABLE 60X90IN (DRAPES) ×3 IMPLANT
COVER MAYO STAND STRL (DRAPES) ×3 IMPLANT
DECANTER SPIKE VIAL GLASS SM (MISCELLANEOUS) IMPLANT
DERMABOND ADVANCED (GAUZE/BANDAGES/DRESSINGS)
DERMABOND ADVANCED .7 DNX12 (GAUZE/BANDAGES/DRESSINGS) IMPLANT
DEVICE DUBIN W/COMP PLATE 8390 (MISCELLANEOUS) IMPLANT
DRAPE LAPAROSCOPIC ABDOMINAL (DRAPES) ×3 IMPLANT
DRAPE UTILITY XL STRL (DRAPES) ×3 IMPLANT
DRSG TEGADERM 4X4.75 (GAUZE/BANDAGES/DRESSINGS) ×3 IMPLANT
ELECT BLADE 4.0 EZ CLEAN MEGAD (MISCELLANEOUS)
ELECT COATED BLADE 2.86 ST (ELECTRODE) ×5 IMPLANT
ELECT REM PT RETURN 9FT ADLT (ELECTROSURGICAL) ×3
ELECTRODE BLDE 4.0 EZ CLN MEGD (MISCELLANEOUS) IMPLANT
ELECTRODE REM PT RTRN 9FT ADLT (ELECTROSURGICAL) ×1 IMPLANT
GLOVE BIO SURGEON STRL SZ7 (GLOVE) ×3 IMPLANT
GLOVE BIOGEL PI IND STRL 7.5 (GLOVE) ×1 IMPLANT
GLOVE BIOGEL PI IND STRL 8 (GLOVE) IMPLANT
GLOVE BIOGEL PI INDICATOR 7.5 (GLOVE) ×2
GLOVE BIOGEL PI INDICATOR 8 (GLOVE) ×2
GLOVE SURG SS PI 7.5 STRL IVOR (GLOVE) ×2 IMPLANT
GOWN STRL REUS W/ TWL LRG LVL3 (GOWN DISPOSABLE) ×3 IMPLANT
GOWN STRL REUS W/TWL LRG LVL3 (GOWN DISPOSABLE) ×6
HEMOSTAT ARISTA ABSORB 3G PWDR (MISCELLANEOUS) ×4 IMPLANT
ILLUMINATOR WAVEGUIDE N/F (MISCELLANEOUS) ×2 IMPLANT
KIT MARKER MARGIN INK (KITS) ×3 IMPLANT
LIGHT WAVEGUIDE WIDE FLAT (MISCELLANEOUS) IMPLANT
NDL HYPO 25X1 1.5 SAFETY (NEEDLE) ×1 IMPLANT
NEEDLE HYPO 25X1 1.5 SAFETY (NEEDLE) ×3 IMPLANT
NS IRRIG 1000ML POUR BTL (IV SOLUTION) IMPLANT
PACK BASIN DAY SURGERY FS (CUSTOM PROCEDURE TRAY) ×3 IMPLANT
PENCIL BUTTON HOLSTER BLD 10FT (ELECTRODE) ×3 IMPLANT
SLEEVE SCD COMPRESS KNEE MED (MISCELLANEOUS) ×1 IMPLANT
SPONGE GAUZE 4X4 12PLY STER LF (GAUZE/BANDAGES/DRESSINGS) ×3 IMPLANT
SPONGE LAP 4X18 X RAY DECT (DISPOSABLE) ×3 IMPLANT
STRIP CLOSURE SKIN 1/2X4 (GAUZE/BANDAGES/DRESSINGS) IMPLANT
SUT MNCRL AB 4-0 PS2 18 (SUTURE) IMPLANT
SUT MON AB 5-0 PS2 18 (SUTURE) IMPLANT
SUT SILK 2 0 SH (SUTURE) ×3 IMPLANT
SUT VIC AB 2-0 SH 27 (SUTURE) ×3
SUT VIC AB 2-0 SH 27XBRD (SUTURE) ×1 IMPLANT
SUT VIC AB 3-0 SH 27 (SUTURE) ×3
SUT VIC AB 3-0 SH 27X BRD (SUTURE) ×1 IMPLANT
SUT VIC AB 5-0 PS2 18 (SUTURE) IMPLANT
SUT VICRYL AB 3 0 TIES (SUTURE) IMPLANT
SYR CONTROL 10ML LL (SYRINGE) ×3 IMPLANT
TOWEL OR 17X24 6PK STRL BLUE (TOWEL DISPOSABLE) ×3 IMPLANT
TOWEL OR NON WOVEN STRL DISP B (DISPOSABLE) ×3 IMPLANT
TRAY DSU PREP LF (CUSTOM PROCEDURE TRAY) ×2 IMPLANT
TUBE CONNECTING 20'X1/4 (TUBING)
TUBE CONNECTING 20X1/4 (TUBING) IMPLANT
YANKAUER SUCT BULB TIP NO VENT (SUCTIONS) ×2 IMPLANT

## 2015-11-02 SURGICAL SUPPLY — 66 items
ADH SKN CLS APL DERMABOND .7 (GAUZE/BANDAGES/DRESSINGS) ×1
APPLIER CLIP 9.375 MED OPEN (MISCELLANEOUS) ×3
APR CLP MED 9.3 20 MLT OPN (MISCELLANEOUS) ×1
BINDER BREAST LRG (GAUZE/BANDAGES/DRESSINGS) IMPLANT
BINDER BREAST MEDIUM (GAUZE/BANDAGES/DRESSINGS) IMPLANT
BINDER BREAST XLRG (GAUZE/BANDAGES/DRESSINGS) IMPLANT
BINDER BREAST XXLRG (GAUZE/BANDAGES/DRESSINGS) ×2 IMPLANT
BLADE SURG 15 STRL LF DISP TIS (BLADE) ×1 IMPLANT
BLADE SURG 15 STRL SS (BLADE) ×3
CANISTER SUC SOCK COL 7IN (MISCELLANEOUS) IMPLANT
CANISTER SUCT 1200ML W/VALVE (MISCELLANEOUS) ×2 IMPLANT
CHLORAPREP W/TINT 26ML (MISCELLANEOUS) ×3 IMPLANT
CLIP APPLIE 9.375 MED OPEN (MISCELLANEOUS) IMPLANT
CLIP TI WIDE RED SMALL 6 (CLIP) ×3 IMPLANT
CLOSURE WOUND 1/2 X4 (GAUZE/BANDAGES/DRESSINGS) ×1
COVER BACK TABLE 60X90IN (DRAPES) ×3 IMPLANT
COVER MAYO STAND STRL (DRAPES) ×3 IMPLANT
COVER PROBE W GEL 5X96 (DRAPES) ×3 IMPLANT
DECANTER SPIKE VIAL GLASS SM (MISCELLANEOUS) IMPLANT
DERMABOND ADVANCED (GAUZE/BANDAGES/DRESSINGS) ×2
DERMABOND ADVANCED .7 DNX12 (GAUZE/BANDAGES/DRESSINGS) ×1 IMPLANT
DEVICE DUBIN W/COMP PLATE 8390 (MISCELLANEOUS) ×3 IMPLANT
DRAPE LAPAROSCOPIC ABDOMINAL (DRAPES) ×3 IMPLANT
DRAPE UTILITY XL STRL (DRAPES) ×3 IMPLANT
ELECT COATED BLADE 2.86 ST (ELECTRODE) ×3 IMPLANT
ELECT REM PT RETURN 9FT ADLT (ELECTROSURGICAL) ×3
ELECTRODE REM PT RTRN 9FT ADLT (ELECTROSURGICAL) ×1 IMPLANT
GLOVE BIO SURGEON STRL SZ7 (GLOVE) ×6 IMPLANT
GLOVE BIOGEL PI IND STRL 7.0 (GLOVE) IMPLANT
GLOVE BIOGEL PI IND STRL 7.5 (GLOVE) ×1 IMPLANT
GLOVE BIOGEL PI INDICATOR 7.0 (GLOVE) ×2
GLOVE BIOGEL PI INDICATOR 7.5 (GLOVE) ×2
GLOVE ECLIPSE 6.5 STRL STRAW (GLOVE) ×2 IMPLANT
GLOVE EXAM NITRILE EXT CUFF MD (GLOVE) ×2 IMPLANT
GOWN STRL REUS W/ TWL LRG LVL3 (GOWN DISPOSABLE) ×2 IMPLANT
GOWN STRL REUS W/TWL LRG LVL3 (GOWN DISPOSABLE) ×6
ILLUMINATOR WAVEGUIDE N/F (MISCELLANEOUS) ×2 IMPLANT
KIT MARKER MARGIN INK (KITS) ×3 IMPLANT
LIGHT WAVEGUIDE WIDE FLAT (MISCELLANEOUS) IMPLANT
NDL HYPO 25X1 1.5 SAFETY (NEEDLE) ×1 IMPLANT
NDL SAFETY ECLIPSE 18X1.5 (NEEDLE) IMPLANT
NEEDLE HYPO 18GX1.5 SHARP (NEEDLE)
NEEDLE HYPO 25X1 1.5 SAFETY (NEEDLE) ×3 IMPLANT
NS IRRIG 1000ML POUR BTL (IV SOLUTION) ×2 IMPLANT
PACK BASIN DAY SURGERY FS (CUSTOM PROCEDURE TRAY) ×3 IMPLANT
PENCIL BUTTON HOLSTER BLD 10FT (ELECTRODE) ×3 IMPLANT
SHEET MEDIUM DRAPE 40X70 STRL (DRAPES) IMPLANT
SLEEVE SCD COMPRESS KNEE MED (MISCELLANEOUS) ×3 IMPLANT
SPONGE LAP 4X18 X RAY DECT (DISPOSABLE) ×5 IMPLANT
STOCKINETTE IMPERVIOUS LG (DRAPES) IMPLANT
STRIP CLOSURE SKIN 1/2X4 (GAUZE/BANDAGES/DRESSINGS) ×2 IMPLANT
SUT ETHILON 2 0 FS 18 (SUTURE) IMPLANT
SUT MNCRL AB 4-0 PS2 18 (SUTURE) ×3 IMPLANT
SUT MON AB 5-0 PS2 18 (SUTURE) ×2 IMPLANT
SUT SILK 2 0 SH (SUTURE) ×4 IMPLANT
SUT VIC AB 2-0 SH 27 (SUTURE) ×6
SUT VIC AB 2-0 SH 27XBRD (SUTURE) ×1 IMPLANT
SUT VIC AB 3-0 SH 27 (SUTURE) ×3
SUT VIC AB 3-0 SH 27X BRD (SUTURE) ×1 IMPLANT
SUT VIC AB 5-0 PS2 18 (SUTURE) IMPLANT
SYR CONTROL 10ML LL (SYRINGE) ×3 IMPLANT
TOWEL OR 17X24 6PK STRL BLUE (TOWEL DISPOSABLE) ×3 IMPLANT
TOWEL OR NON WOVEN STRL DISP B (DISPOSABLE) ×1 IMPLANT
TUBE CONNECTING 20'X1/4 (TUBING) ×1
TUBE CONNECTING 20X1/4 (TUBING) ×1 IMPLANT
YANKAUER SUCT BULB TIP NO VENT (SUCTIONS) ×2 IMPLANT

## 2015-11-02 NOTE — Anesthesia Postprocedure Evaluation (Signed)
Anesthesia Post Note  Patient: Lindsay Pacheco  Procedure(s) Performed: Procedure(s) (LRB): EVACUATION HEMATOMA BREAST (Left)  Patient location during evaluation: PACU Anesthesia Type: General Level of consciousness: awake and alert and oriented Pain management: pain level controlled Vital Signs Assessment: post-procedure vital signs reviewed and stable Respiratory status: spontaneous breathing, nonlabored ventilation and respiratory function stable Cardiovascular status: blood pressure returned to baseline and stable Postop Assessment: no signs of nausea or vomiting Anesthetic complications: no    Last Vitals:  Vitals:   11/02/15 1730 11/02/15 1745  BP: (!) 98/53 (!) 93/50  Pulse: (!) 107 (!) 106  Resp: (!) 21 18  Temp:      Last Pain:  Vitals:   11/02/15 1745  TempSrc:   PainSc: 0-No pain                 Beatrice Sehgal A.

## 2015-11-02 NOTE — Op Note (Signed)
Preoperative diagnosis: Left breast cancer, clinical stage II s/p lumpectomy/sn today Postoperative diagnosis: same as above Procedure:evacuation left axillary hematoma Surgeon Dr Serita Grammes Anes general  EBL: 500 cc Comps none Specimen: none Sponge count correct at completion Dispo to recovery stable  Indications: This is a 70 yof with two new areas on her left breast that have undergone biopsy that show IDC and DCIS. We discussed options and decided to proceed with left lumpectomy and sn biopsy. she underwent this earlier today without complication. She acutely developed swelling and pain in axilla postop. She clearly had hematoma. I discussed evacuation.   Procedure: After informed consent was obtained the patient was taken to the OR. Marland Kitchen She was given anitibiotics. SCDs were in place. She was prepped and draped in the standard sterile surgical fashion. A timeout was performed. I then reentered the axilla. There was about 500 cc of blood and clot. I immediately evacuated this and noted a bleeding small artery superior. I controlled this with clips and a silk ligature. The remainder of the cavity was oozing and friable but this was clearly the source.  I then placed arista throughout the cavity as well as a 19 Fr Blake drain.  I secured this with a nylon suture.   I then closed the fascia with 2-0 vicryl.  I closed the skin with 3-0 vicryl and 4-0 monocryl. Glue and steristrips were placed A breast binder was placed. She was extubated and transferred to recovery. She will be transferred to main hospital on telemetry overnight and she has labs pending.

## 2015-11-02 NOTE — Discharge Instructions (Signed)
[facility] Office Phone Number [phone]  POST OP INSTRUCTIONS  Always review your discharge instruction sheet given to you by the facility where your surgery was performed.  IF YOU HAVE DISABILITY OR FAMILY LEAVE FORMS, YOU MUST BRING THEM TO THE OFFICE FOR PROCESSING.  DO NOT GIVE THEM TO YOUR DOCTOR.  1. A prescription for pain medication may be given to you upon discharge.  Take your pain medication as prescribed, if needed.  If narcotic pain medicine is not needed, then you may take acetaminophen (Tylenol), naprosyn (Alleve) or ibuprofen (Advil) as needed. 2. Take your usually prescribed medications unless otherwise directed 3. If you need a refill on your pain medication, please contact your pharmacy.  They will contact our office to request authorization.  Prescriptions will not be filled after 5pm or on week-ends. 4. You should eat very light the first 24 hours after surgery, such as soup, crackers, pudding, etc.  Resume your normal diet the day after surgery. 5. Most patients will experience some swelling and bruising in the breast.  Ice packs and a good support bra will help.  Wear the breast binder provided or a sports bra for 72 hours day and night.  After that wear a sports bra during the day until you return to the office. Swelling and bruising can take several days to resolve.  6. It is common to experience some constipation if taking pain medication after surgery.  Increasing fluid intake and taking a stool softener will usually help or prevent this problem from occurring.  A mild laxative (Milk of Magnesia or Miralax) should be taken according to package directions if there are no bowel movements after 48 hours. 7. Unless discharge instructions indicate otherwise, you may remove your bandages 48 hours after surgery and you may shower at that time.  You may have steri-strips (small skin tapes) in place directly over the incision.  These strips should be left on the  skin for 7-10 days and will come off on their own.  If your surgeon used skin glue on the incision, you may shower in 24 hours.  The glue will flake off over the next 2-3 weeks.  Any sutures or staples will be removed at the office during your follow-up visit. 8. ACTIVITIES:  You may resume regular daily activities (gradually increasing) beginning the next day.  Wearing a good support bra or sports bra minimizes pain and swelling.  You may have sexual intercourse when it is comfortable. a. You may drive when you no longer are taking prescription pain medication, you can comfortably wear a seatbelt, and you can safely maneuver your car and apply brakes. b. RETURN TO WORK:  ______________________________________________________________________________________ 9. You should see your doctor in the office for a follow-up appointment approximately two weeks after your surgery.  Your doctors nurse will typically make your follow-up appointment when she calls you with your pathology report.  Expect your pathology report 3-4 business days after your surgery.  You may call to check if you do not hear from [place] after three days.   WHEN TO CALL DR WAKEFIELD: 1. Fever over 101.0 2. Nausea and/or vomiting. 3. Extreme swelling or bruising. 4. Continued bleeding from incision. 5. Increased pain, redness, or drainage from the incision.  The clinic staff is available to answer your questions during regular business hours.  Please dont hesitate to call and ask to speak to one of the nurses for clinical concerns.  If you have a medical emergency, go to  the nearest emergency room or call 911.  A surgeon from [facility] is always on call at the hospital.  For further questions, please visit [email or web address]   Post Anesthesia Home Care Instructions  Activity: Get plenty of rest for the remainder of the day. A responsible adult should stay with you for 24 hours following the procedure.    For the next 24 hours, DO NOT: -Drive a car -Paediatric nurse -Drink alcoholic beverages -Take any medication unless instructed by your physician -Make any legal decisions or sign important papers.  Meals: Start with liquid foods such as gelatin or soup. Progress to regular foods as tolerated. Avoid greasy, spicy, heavy foods. If nausea and/or vomiting occur, drink only clear liquids until the nausea and/or vomiting subsides. Call your physician if vomiting continues.  Special Instructions/Symptoms: Your throat may feel dry or sore from the anesthesia or the breathing tube placed in your throat during surgery. If this causes discomfort, gargle with warm salt water. The discomfort should disappear within 24 hours.  If you had a scopolamine patch placed behind your ear for the management of post- operative nausea and/or vomiting:  1. The medication in the patch is effective for 72 hours, after which it should be removed.  Wrap patch in a tissue and discard in the trash. Wash hands thoroughly with soap and water. 2. You may remove the patch earlier than 72 hours if you experience unpleasant side effects which may include dry mouth, dizziness or visual disturbances. 3. Avoid touching the patch. Wash your hands with soap and water after contact with the patch.   Post Anesthesia Home Care Instructions  Activity: Get plenty of rest for the remainder of the day. A responsible adult should stay with you for 24 hours following the procedure.  For the next 24 hours, DO NOT: -Drive a car -Paediatric nurse -Drink alcoholic beverages -Take any medication unless instructed by your physician -Make any legal decisions or sign important papers.  Meals: Start with liquid foods such as gelatin or soup. Progress to regular foods as tolerated. Avoid greasy, spicy, heavy foods. If nausea and/or vomiting occur, drink only clear liquids until the nausea and/or vomiting subsides. Call your physician if  vomiting continues.  Special Instructions/Symptoms: Your throat may feel dry or sore from the anesthesia or the breathing tube placed in your throat during surgery. If this causes discomfort, gargle with warm salt water. The discomfort should disappear within 24 hours.  If you had a scopolamine patch placed behind your ear for the management of post- operative nausea and/or vomiting:  1. The medication in the patch is effective for 72 hours, after which it should be removed.  Wrap patch in a tissue and discard in the trash. Wash hands thoroughly with soap and water. 2. You may remove the patch earlier than 72 hours if you experience unpleasant side effects which may include dry mouth, dizziness or visual disturbances. 3. Avoid touching the patch. Wash your hands with soap and water after contact with the patch.

## 2015-11-02 NOTE — H&P (Signed)
Lindsay Pacheco is an 55 y.o. female.   Chief Complaint: breast cancer HPI:  55 yof referred by Nicanor Bake for evaluation of new left breast cancer. She is disabled from her arthritis and lives in [place]. She has some left breast itching but no other symptoms. she has no prior breast history. she has one paternal aunt who had breast cancer in her 34s. she has recently been treated for diverticular disease as outpatient and is due csc dec 6. she underwent mm that shows c density breasts. she had calcifications in the right breast and distortion in the left breast. the calcifications in the right breast appear benign. in the superior subareolar left breast there is a distortion as well as 2-3 small masses. on [place] there is area of shadowing and there are 3 adjacent hypoechoic masses in the uiq. the largest is 1.1 cm and all together they mearsure 2.5 cm. US of the left axilla is negative. she underwent biopsy of the 12 oclock lesion and this is lg IDC with dcis. this is 90% er and pr positive, her 2 negative, Ki is 19%. biopsy of the area at 51 oclock is dcis. I dont have clip films now. she is here to discuss options     Past Medical History:  Diagnosis Date  . Anxiety   . Arthritis   . Breast cancer ([place]) 09/30/2015   Left Breast  . Depression   . Diabetes mellitus, type II ([place])   . Diverticulitis 1987  . Hypertension   . Irritable bowel syndrome (IBS)   . Sleep apnea    does not use CPAP  . Sleep apnea     Past Surgical History:  Procedure Laterality Date  . CHOLECYSTECTOMY    . JOINT REPLACEMENT Right   . KNEE ARTHROPLASTY      Family History  Problem Relation Age of Onset  . Anxiety disorder Father   . Depression Father   . Other Father     Abestosis  . Depression Sister   . Diabetes Mother    Social History:  reports that she has never smoked. She has never used smokeless tobacco. She reports that she drinks alcohol. She reports that she uses drugs,  including Marijuana.  Allergies:  Allergies  Allergen Reactions  . Sulfa Antibiotics Nausea And Vomiting    Medications Prior to Admission  Medication Sig Dispense Refill  . atorvastatin (LIPITOR) 10 MG tablet Take 10 mg by mouth daily.    . DULoxetine (CYMBALTA) 60 MG capsule Take 1 capsule (60 mg total) by mouth daily. 30 capsule 0  . lisinopril-hydrochlorothiazide (PRINZIDE,ZESTORETIC) 20-12.5 MG tablet Take 1 tablet by mouth daily.  6  . LORazepam (ATIVAN) 0.5 MG tablet Take 0.5 mg by mouth daily as needed for anxiety.    . metFORMIN (GLUCOPHAGE) 500 MG tablet Take by mouth 2 (two) times daily with a meal.    . divalproex (DEPAKOTE ER) 500 MG 24 hr tablet Take 1 tablet (500 mg total) by mouth at bedtime. 30 tablet 0  . nitroGLYCERIN (NITROGLYN) 2 % ointment Apply topically 4 (four) times daily.      No results found for this or any previous visit (from the past 48 hour(s)). No results found.  ROS Negative  Height 5' 7.5" (1.715 m), weight 121.6 kg (268 lb). Physical Exam   Vitals (Sonya Bynum CMA; 10/09/2015 8:01 AM) 10/09/2015 8:00 AM Weight: 271 lb Height: 67in Body Surface Area: 2.3 m Body Mass Index: 42.44 kg/m  Temp.:  98.54F(Temporal)  Pulse: 81 (Regular)  BP: 134/84 (Sitting, Left Arm, Standard)  Physical Exam [name] MD; 10/11/2015 8:06 PM) General Mental Status-Alert. Orientation-Oriented X3. Chest and Lung Exam Chest and lung exam reveals -on auscultation, normal breath sounds, no adventitious sounds and normal vocal resonance. Breast Nipples-No Discharge. Breast Lump-No Palpable Breast Mass. Cardiovascular Cardiovascular examination reveals -normal heart sounds, regular rate and rhythm with no murmurs. Lymphatic Head & Neck General Head & Neck Lymphatics: Bilateral - Description - Normal. Axillary General Axillary Region: Bilateral - Description - Normal. Note: no Buxton adenopathy   Assessment/Plan  MALIGNANT  NEOPLASM OF UPPER-INNER QUADRANT OF LEFT BREAST IN FEMALE, ESTROGEN RECEPTOR POSITIVE (C50.212) Left breast seed guided lumpectomy, left ax sn biopsy  We discussed the staging and pathophysiology of breast cancer. We discussed all of the different options for treatment for breast cancer including surgery, chemotherapy, radiation therapy, Herceptin, and antiestrogen therapy. We discussed a sentinel lymph node biopsy as she does not appear to having lymph node involvement right now. We discussed the performance of that with injection of radioactive tracer. We discussed that there is a chance of having a positive node with a sentinel lymph node biopsy and we will await the permanent pathology to make any other first further decisions in terms of her treatment. One of these options might be to return to the operating room to perform an axillary lymph node dissection. We discussed up to a 5% risk lifetime of chronic shoulder pain as well as lymphedema associated with a sentinel lymph node biopsy. I think she is candidate for lumpectomy. we discussed lumpectomy with radiation vs mastectomy (likely without radiation and reconstruction would be possible). we discussed these are equivalent treatments if possible. We elected to do a breast mri due to density and vague area on mm and [place].Breast lumpectomy is reasonable option and we willl proceed with that   [facility], MD 11/02/2015, 8:09 AM

## 2015-11-02 NOTE — Op Note (Signed)
Preoperative diagnosis: Left breast cancer, clinical stage II Postoperative diagnosis: same as above Procedure: 1. Left breast seed bracketed lumpectomy 2. Left axillary sentinel nodebiopsy Surgeon Dr Serita Grammes Anes general with pectoral block EBL: minimal Comps none Specimen:  1. Left breast marked with paint 2. Left axillary sentinel nodes with count of 147 highest 3. Additional lateral, posterior, medial, inferior and superior marginsmarked short superior, long lateral, double deep Sponge count correct at completion Dispo to recovery stable  Indications: This is a 51 yof with two new areas on her left breast that have undergone biopsy that show IDC and DCIS.  We discussed options and decided to proceed with left lumpectomy and sn biopsy.   Procedure: After informed consent was obtained the patient was taken to the OR. She was injected with technetium in the standard periareolar fashion. She underwent a pectoral block. She was given anitibiotics. SCDs were in place. She was prepped and draped in the standard sterile surgical fashion. A timeout was performed. I then located the seeds in the upper inner quadrant.  I then infiltrated marcaine and made a periareolar incision. I used the lighted retractor system to tunnel to the lesions. I then removed the seeds with an attempt to get a clear margin. This were passed off the table after marking with paint.   I did confirm removal of theclipsand seeds with mammography. I did remove additional margins as above. The posterior margin is the muscle and the anterior margin is the skin. I placed clips in the cavity.  I obtained hemostasis.  I then closed this with 2-0 vicryl, 3-0 vicryl and 5-0 monocryl. Glue and steristrips were applied. I then made an incision in the axilla. I went through the axillary fascia. I identified the sentinel nodes with the highest count as above. There was no background radioactivity. I obtained  hemostasis. I then closed the fascia with 2-0 vicryl.  I closed the skin with 3-0 vicryl and 4-0 monocryl. Glue and steristrips were placed A breast binder was placed. She was extubated and transferred to recovery stable

## 2015-11-02 NOTE — Progress Notes (Signed)
Assisted Dr. Kyle Jackson with left, ultrasound guided, pectoralis block. Side rails up, monitors on throughout procedure. See vital signs in flow sheet. Tolerated Procedure well. 

## 2015-11-02 NOTE — Progress Notes (Signed)
Patient ID: Lindsay Pacheco, female   DOB: Apr 09, 1960, 55 y.o.   MRN: [id number] Has symptomatic hematoma in axilla, needs return to or, discussed with patient

## 2015-11-02 NOTE — Anesthesia Procedure Notes (Signed)
Procedure Name: LMA Insertion Date/Time: 11/02/2015 9:27 AM Performed by: Kody Vigil D Pre-anesthesia Checklist: Patient identified, Emergency Drugs available, Suction available and Patient being monitored Patient Re-evaluated:Patient Re-evaluated prior to inductionOxygen Delivery Method: Circle system utilized Preoxygenation: Pre-oxygenation with 100% oxygen Intubation Type: IV induction Ventilation: Mask ventilation without difficulty LMA: LMA inserted LMA Size: 4.0 Number of attempts: 1 Airway Equipment and Method: Bite block Placement Confirmation: positive ETCO2 Tube secured with: Tape Dental Injury: Teeth and Oropharynx as per pre-operative assessment

## 2015-11-02 NOTE — Anesthesia Postprocedure Evaluation (Signed)
Anesthesia Post Note  Patient: Lindsay Pacheco  Procedure(s) Performed: Procedure(s) (LRB): LEFT BREAST LUMPECTOMY WITH BRACKETED RADIOACTIVE SEED AND LEFT AXILLARY SENTINEL LYMPH NODE BIOPSY (Left)  Patient location during evaluation: PACU Anesthesia Type: General Level of consciousness: awake and alert and oriented Pain management: pain level controlled Vital Signs Assessment: post-procedure vital signs reviewed and stable Respiratory status: spontaneous breathing, nonlabored ventilation and respiratory function stable Cardiovascular status: blood pressure returned to baseline and stable Postop Assessment: no signs of nausea or vomiting Anesthetic complications: no    Last Vitals:  Vitals:   11/02/15 1100 11/02/15 1115  BP: (!) 105/91 129/77  Pulse: 97 95  Resp: (!) 23 15  Temp:      Last Pain:  Vitals:   11/02/15 1115  TempSrc:   PainSc: 5                  Latausha Flamm A.

## 2015-11-02 NOTE — Interval H&P Note (Signed)
History and Physical Interval Note:  11/02/2015 9:12 AM  Lindsay Pacheco  has presented today for surgery, with the diagnosis of LEFT BREAST CANCER  The various methods of treatment have been discussed with the patient and family. After consideration of risks, benefits and other options for treatment, the patient has consented to  Procedure(s): LEFT BREAST LUMPECTOMY WITH BRACKETED RADIOACTIVE SEED AND LEFT AXILLARY SENTINEL LYMPH NODE BIOPSY (Left) as a surgical intervention .  The patient's history has been reviewed, patient examined, no change in status, stable for surgery.  I have reviewed the patient's chart and labs.  Questions were answered to the patient's satisfaction.     [name]

## 2015-11-02 NOTE — Anesthesia Preprocedure Evaluation (Addendum)
Anesthesia Evaluation  Patient identified by MRN, date of birth, ID band Patient awake    Reviewed: Allergy & Precautions, H&P , Patient's Chart, lab work & pertinent test results, reviewed documented beta blocker date and time   Airway Mallampati: II  TM Distance: >3 FB Neck ROM: full    Dental no notable dental hx.    Pulmonary sleep apnea ,    Pulmonary exam normal breath sounds clear to auscultation       Cardiovascular hypertension, Pt. on medications  Rhythm:regular Rate:Normal     Neuro/Psych PSYCHIATRIC DISORDERS Anxiety Depression    GI/Hepatic   Endo/Other  diabetes, Type 2, Oral Hypoglycemic AgentsMorbid obesity  Renal/GU      Musculoskeletal  (+) Arthritis ,   Abdominal   Peds  Hematology   Anesthesia Other Findings Hypertension       Diabetes mellitus, type II       Breast cancer  Left Breast  Sleep apnea does not use CPAP       Reproductive/Obstetrics                            Anesthesia Physical Anesthesia Plan  ASA: III  Anesthesia Plan:    Post-op Pain Management:    Induction: Intravenous  Airway Management Planned: LMA  Additional Equipment:   Intra-op Plan:   Post-operative Plan:   Informed Consent: I have reviewed the patients History and Physical, chart, labs and discussed the procedure including the risks, benefits and alternatives for the proposed anesthesia with the patient or authorized representative who has indicated his/her understanding and acceptance.   Dental Advisory Given and Dental advisory given  Plan Discussed with: CRNA and Surgeon  Anesthesia Plan Comments: (Discussed GA with LMA, possible sore throat, potential need to switch to ETT, N/V, pulmonary aspiration. Questions answered. )        Anesthesia Quick Evaluation

## 2015-11-02 NOTE — Progress Notes (Signed)
Report called to 6N to Remo Lipps, Therapist, sports.

## 2015-11-02 NOTE — Transfer of Care (Signed)
Immediate Anesthesia Transfer of Care Note  Patient: Lindsay Pacheco  Procedure(s) Performed: Procedure(s): EVACUATION HEMATOMA BREAST (Left)  Patient Location: PACU  Anesthesia Type:General  Level of Consciousness: awake, alert , oriented and patient cooperative  Airway & Oxygen Therapy: Patient Spontanous Breathing and Patient connected to face mask oxygen  Post-op Assessment: Report given to RN and Post -op Vital signs reviewed and stable  Post vital signs: Reviewed and stable  Last Vitals:  Vitals:   11/02/15 1455 11/02/15 1624  BP: 108/67   Pulse: 89 (!) 107  Resp:  19  Temp:  (P) 36.5 C    Last Pain:  Vitals:   11/02/15 1624  TempSrc:   PainSc: (P) 0-No pain         Complications: No apparent anesthesia complications

## 2015-11-02 NOTE — Anesthesia Procedure Notes (Signed)
Anesthesia Regional Block:  Pectoralis block  Pre-Anesthetic Checklist: ,, timeout performed, Correct Patient, Correct Site, Correct Laterality, Correct Procedure, Correct Position, site marked,,, at surgeon's request and post-op pain management  Laterality: Left  Prep: chloraprep       Needles:   Needle Type: Echogenic Needle     Needle Length: 9cm 9 cm Needle Gauge: 22 and 22 G    Additional Needles: Pectoralis block Narrative:  Injection made incrementally with aspirations every 5 mL. Anesthesiologist: Lyndle Herrlich  Additional Notes: .5% Marcaine 25cc

## 2015-11-02 NOTE — Addendum Note (Signed)
Addendum  created 11/02/15 1459 by Valere Dross, MD   Order sets accessed

## 2015-11-02 NOTE — Transfer of Care (Signed)
Immediate Anesthesia Transfer of Care Note  Patient: Lindsay Pacheco  Procedure(s) Performed: Procedure(s): LEFT BREAST LUMPECTOMY WITH BRACKETED RADIOACTIVE SEED AND LEFT AXILLARY SENTINEL LYMPH NODE BIOPSY (Left)  Patient Location: PACU  Anesthesia Type:GA combined with regional for post-op pain  Level of Consciousness: awake, alert , oriented and patient cooperative  Airway & Oxygen Therapy: Patient Spontanous Breathing and Patient connected to face mask oxygen  Post-op Assessment: Report given to RN and Post -op Vital signs reviewed and stable  Post vital signs: Reviewed and stable  Last Vitals:  Vitals:   11/02/15 0910 11/02/15 0915  BP:    Pulse: 77 81  Resp: 11 (!) 23  Temp:      Last Pain:  Vitals:   11/02/15 0813  TempSrc: Oral         Complications: No apparent anesthesia complications

## 2015-11-02 NOTE — Anesthesia Procedure Notes (Signed)
Procedure Name: Intubation Date/Time: 11/02/2015 3:23 PM Performed by: Kidada Ging D Pre-anesthesia Checklist: Patient identified, Emergency Drugs available, Suction available and Patient being monitored Patient Re-evaluated:Patient Re-evaluated prior to inductionOxygen Delivery Method: Circle system utilized Preoxygenation: Pre-oxygenation with 100% oxygen Intubation Type: IV induction, Rapid sequence and Cricoid Pressure applied Laryngoscope Size: Mac and 3 Grade View: Grade I Tube type: Oral Tube size: 7.0 mm Number of attempts: 1 Airway Equipment and Method: Stylet and Oral airway Placement Confirmation: ETT inserted through vocal cords under direct vision,  positive ETCO2 and breath sounds checked- equal and bilateral Secured at: 21 cm Tube secured with: Tape Dental Injury: Teeth and Oropharynx as per pre-operative assessment  Comments: Patient ate lunch post-op surgery this morning

## 2015-11-02 NOTE — Anesthesia Preprocedure Evaluation (Signed)
Anesthesia Evaluation  Patient identified by MRN, date of birth, ID band Patient awake    Reviewed: Allergy & Precautions, H&P , NPO status , Patient's Chart, lab work & pertinent test results, reviewed documented beta blocker date and time   Airway Mallampati: II  TM Distance: >3 FB Neck ROM: full    Dental no notable dental hx.    Pulmonary sleep apnea ,    Pulmonary exam normal breath sounds clear to auscultation       Cardiovascular Exercise Tolerance: Good hypertension, Pt. on medications  Rhythm:regular Rate:Normal     Neuro/Psych PSYCHIATRIC DISORDERS Anxiety Depression    GI/Hepatic negative GI ROS, Neg liver ROS,   Endo/Other  diabetes, Well Controlled, Type 2, Oral Hypoglycemic AgentsMorbid obesity  Renal/GU negative Renal ROS  negative genitourinary   Musculoskeletal  (+) Arthritis , Hematoma left breast/anterolateral chest wall   Abdominal (+) + obese,   Peds  Hematology negative hematology ROS (+)   Anesthesia Other Findings Hypertension       Diabetes mellitus, type II       Breast cancer  Left Breast  Sleep apnea does not use CPAP       Reproductive/Obstetrics                             Anesthesia Physical  Anesthesia Plan  ASA: III and emergent  Anesthesia Plan: General   Post-op Pain Management:    Induction: Intravenous, Rapid sequence and Cricoid pressure planned  Airway Management Planned: Oral ETT  Additional Equipment:   Intra-op Plan:   Post-operative Plan: Extubation in OR  Informed Consent: I have reviewed the patients History and Physical, chart, labs and discussed the procedure including the risks, benefits and alternatives for the proposed anesthesia with the patient or authorized representative who has indicated his/her understanding and acceptance.   Dental advisory given  Plan Discussed with: CRNA, Surgeon and  Anesthesiologist  Anesthesia Plan Comments:         Anesthesia Quick Evaluation

## 2015-11-03 ENCOUNTER — Encounter (HOSPITAL_BASED_OUTPATIENT_CLINIC_OR_DEPARTMENT_OTHER): Payer: Self-pay | Admitting: General Surgery

## 2015-11-03 DIAGNOSIS — C50212 Malignant neoplasm of upper-inner quadrant of left female breast: Secondary | ICD-10-CM | POA: Diagnosis not present

## 2015-11-03 LAB — GLUCOSE, CAPILLARY: Glucose-Capillary: 249 mg/dL — ABNORMAL HIGH (ref 65–99)

## 2015-11-03 NOTE — Progress Notes (Signed)
1 Day Post-Op  Subjective: Feels great this am  Objective: Vital signs in last 24 hours: Temp:  [97.3 F (36.3 C)-98.7 F (37.1 C)] 98.1 F (36.7 C) (10/31 0500) Pulse Rate:  [62-121] 121 (10/31 0500) Resp:  [11-33] 18 (10/31 0500) BP: (70-133)/(42-91) 111/88 (10/31 0500) SpO2:  [90 %-100 %] 97 % (10/31 0500) FiO2 (%):  [6 %] 6 % (10/30 1630) Weight:  [121.6 kg (268 lb)-122.7 kg (270 lb 8.1 oz)] 122.7 kg (270 lb 8.1 oz) (10/30 1830) Last BM Date: 11/02/15  Intake/Output from previous day: 10/30 0701 - 10/31 0700 In: 3711.8 [P.O.:942; I.V.:2769.8] Out: 175 [Drains:175] Intake/Output this shift: No intake/output data recorded.  left breast incision without drainage, drain in axilla serosang, no hematoma, axillary incision clean  Lab Results:   Recent Labs  11/02/15 1555  WBC 11.5*  HGB 9.9*  HCT 30.5*  PLT 252   BMET No results for input(s): NA, K, CL, CO2, GLUCOSE, BUN, CREATININE, CALCIUM in the last 72 hours. PT/INR No results for input(s): LABPROT, INR in the last 72 hours. ABG No results for input(s): PHART, HCO3 in the last 72 hours.  Invalid input(s): PCO2, PO2  Studies/Results: Nm Sentinel Node Inj-no Rpt (breast)  Result Date: 11/02/2015 CLINICAL DATA: left axillary sn biopsy Sulfur colloid was injected intradermally by the nuclear medicine technologist for breast cancer sentinel node localization.   Mm Breast Surgical Specimen  Result Date: 11/02/2015 CLINICAL DATA:  Left breast cancer, 2 sites, status post lumpectomy today after earlier radioactive seed localizations. EXAM: SPECIMEN RADIOGRAPH OF THE LEFT BREAST COMPARISON:  Previous exam(s). FINDINGS: Status post excision of the left breast. The 2 radioactive seeds and biopsy marker clips are present, completely intact, and were marked for pathology. The locations of the 2 seeds and clips within the specimen were discussed with the OR staff during the procedure. IMPRESSION: Specimen radiograph of the  left breast. Electronically Signed   By: Franki Cabot M.D.   On: 11/02/2015 10:11    Anti-infectives: Anti-infectives    Start     Dose/Rate Route Frequency Ordered Stop   11/02/15 0751  ceFAZolin (ANCEF) IVPB 2g/100 mL premix     2 g 200 mL/hr over 30 Minutes Intravenous On call to O.R. 11/02/15 0751 11/02/15 [id number]      Assessment/Plan: POD 1 lump/sn/hematoma evac  Dc home today Dc drain in office Path pending All home meds restarted  [facility] 11/03/2015

## 2015-11-03 NOTE — Progress Notes (Signed)
Patient discharged to home with instructions, demonstrated JP management to patient and family members and gave prescriptions.

## 2015-11-03 NOTE — Discharge Summary (Signed)
Physician Discharge Summary  Patient ID: Lindsay Pacheco MRN: [id number] DOB/AGE: 03-01-1960 55 y.o.  Admit date: 11/02/2015 Discharge date: 11/03/2015  Admission Diagnoses: Hypertension osa Breast cancer  Discharge Diagnoses:  Active Problems:   Cancer of left female breast  [facility])   Discharged Condition:good  Hospital Course: 18 yof with multiple medical issues presented with newly diagnosed left breast cancer. She has undergone seed bracketed lumpectomy and left axillary sn biopsy. This was complicated by postop hematoma in axilla that I evacuated. She remained overnight due to osa and was observed. Her postop hct is acceptable at 30. Her drain output is minimal and serosang.  She is tolerating diet with good pain control and will be discharged home on regular medications.   Consults: none  Significant Diagnostic Studies: none  Treatments: left axillary sn biopsy, left seed bracketed lumpectomy   Disposition: 01-Home or Self Care     Medication List    TAKE these medications   acetaminophen 500 MG tablet Commonly known as:  TYLENOL Take 1,000 mg by mouth every 6 (six) hours as needed for moderate pain or headache.   atorvastatin 10 MG tablet Commonly known as:  LIPITOR Take 10 mg by mouth daily.   divalproex 500 MG 24 hr tablet Commonly known as:  DEPAKOTE ER Take 1 tablet (500 mg total) by mouth at bedtime.   DULoxetine 60 MG capsule Commonly known as:  CYMBALTA Take 1 capsule (60 mg total) by mouth daily.   lamoTRIgine 25 MG tablet Commonly known as:  LAMICTAL Take 50 mg by mouth daily.   lisinopril-hydrochlorothiazide 20-12.5 MG tablet Commonly known as:  PRINZIDE,ZESTORETIC Take 1 tablet by mouth daily.   LORazepam 0.5 MG tablet Commonly known as:  ATIVAN Take 0.5 mg by mouth daily as needed for anxiety.   metFORMIN 500 MG tablet Commonly known as:  GLUCOPHAGE Take 500 mg by mouth 2 (two) times daily with a meal.   oxyCODONE 5 MG immediate  release tablet Commonly known as:  Oxy IR/ROXICODONE Take 1 tablet (5 mg total) by mouth every 6 (six) hours as needed for moderate pain, severe pain or breakthrough pain.      Follow-up Information    Jerelyn Trimarco, MD In 3 weeks.   Specialty:  General Surgery Contact information: [address] [place] [id number] [phone]           Signed: [name] 11/03/2015, 2:37 PM

## 2015-11-05 ENCOUNTER — Other Ambulatory Visit (HOSPITAL_COMMUNITY): Payer: Self-pay | Admitting: Emergency Medicine

## 2015-11-05 NOTE — Progress Notes (Signed)
Oncotype DX sent off today.  Faxed specimen retrieval to pathology.  Confirmed that they received it.

## 2015-11-08 ENCOUNTER — Encounter (HOSPITAL_COMMUNITY): Payer: Self-pay | Admitting: Hematology & Oncology

## 2015-11-12 ENCOUNTER — Ambulatory Visit (HOSPITAL_COMMUNITY): Payer: Self-pay | Admitting: Psychiatry

## 2015-11-13 ENCOUNTER — Encounter (HOSPITAL_COMMUNITY): Payer: Self-pay | Admitting: Emergency Medicine

## 2015-11-16 ENCOUNTER — Encounter (HOSPITAL_COMMUNITY): Payer: Self-pay

## 2015-11-23 ENCOUNTER — Ambulatory Visit (HOSPITAL_COMMUNITY): Payer: Self-pay | Admitting: Psychiatry

## 2015-11-24 ENCOUNTER — Encounter: Payer: Self-pay | Admitting: Physical Therapy

## 2015-11-24 ENCOUNTER — Ambulatory Visit: Payer: Medicare Other | Attending: General Surgery | Admitting: Physical Therapy

## 2015-11-24 DIAGNOSIS — M25612 Stiffness of left shoulder, not elsewhere classified: Secondary | ICD-10-CM | POA: Insufficient documentation

## 2015-11-24 DIAGNOSIS — R600 Localized edema: Secondary | ICD-10-CM | POA: Diagnosis present

## 2015-11-24 DIAGNOSIS — M6281 Muscle weakness (generalized): Secondary | ICD-10-CM | POA: Diagnosis present

## 2015-11-24 NOTE — Patient Instructions (Signed)
Shoulder: Abduction (Supine)    With left arm flat on floor, hold dowel in palm. Slowly move arm up to side of head by pushing with opposite arm. Do not let elbow bend. Hold __5-10__ seconds. Repeat __10__ times. Do __2__ sessions per day. CAUTION: Stretch slowly and gently.  Copyright  VHI. All rights reserved.

## 2015-11-24 NOTE — Therapy (Signed)
[place], [place], [address] Phone: [phone]   Fax:  [phone]  Physical Therapy Evaluation  Patient Details  Name: Lindsay Pacheco MRN: [id number] Date of Birth: Aug 25, 1960 Referring Provider: Donne Hazel  Encounter Date: 11/24/2015      PT End of Session - 11/24/15 0935    Visit Number 1   Number of Visits 9   Date for PT Re-Evaluation 12/22/15   PT Start Time 0846   PT Stop Time 0933   PT Time Calculation (min) 47 min   Activity Tolerance Patient tolerated treatment well   Behavior During Therapy [facility] for tasks assessed/performed      Past Medical History:  Diagnosis Date  . Anxiety   . Arthritis    "knees" (11/02/2015)  . Breast cancer, left breast ([place]) 09/30/2015  . Depression   . Diabetes mellitus, type II ([place])   . Diverticulitis 1987  . Hypertension   . Irritable bowel syndrome (IBS)   . Sleep apnea    does not use CPAP but "I'm suppose to" (11/02/2015)    Past Surgical History:  Procedure Laterality Date  . BREAST BIOPSY Left 09/2015  . BREAST LUMPECTOMY WITH AXILLARY LYMPH NODE BIOPSY Left 11/02/2015   BREAST LUMPECTOMY WITH BRACKETED RADIOACTIVE SEED AND LEFT AXILLARY SENTINEL LYMPH NODE BIOPSY   . BREAST LUMPECTOMY WITH RADIOACTIVE SEED AND SENTINEL LYMPH NODE BIOPSY Left 11/02/2015   Procedure: LEFT BREAST LUMPECTOMY WITH BRACKETED RADIOACTIVE SEED AND LEFT AXILLARY SENTINEL LYMPH NODE BIOPSY;  Surgeon: [name], MD;  Location: [place];  Service: General;  Laterality: Left;  . EVACUATION BREAST HEMATOMA Left 11/02/2015  . EVACUATION BREAST HEMATOMA Left 11/02/2015   Procedure: EVACUATION HEMATOMA BREAST;  Surgeon: [name], MD;  Location: [facility];  Service: General;  Laterality: Left;  . JOINT REPLACEMENT    . KNEE ARTHROSCOPY Right 2000s  . LAPAROSCOPIC CHOLECYSTECTOMY  1995  . TOTAL KNEE ARTHROPLASTY Right 2011     There were no vitals filed for this visit.       Subjective Assessment - 11/24/15 0852    Subjective I am having a time with this. These spots on my breast are hard. The doctor tells me it is scar tissue. I can't feel anything. I am numb. I feel tight on that side.    Pertinent History breast cancer left upper quadrant, er/pr positive, left lumpectomy 11/02/15, pt will see oncologist tomorrow to determine if she will need chemo, she will have to have radiation in the future, R TKA   Patient Stated Goals to get back to normal   Currently in Pain? Yes   Pain Score 2    Pain Location Axilla   Pain Orientation Left   Pain Descriptors / Indicators Discomfort   Pain Type Surgical pain   Pain Onset 1 to 4 weeks ago   Pain Frequency Constant   Aggravating Factors  laying on L side   Pain Relieving Factors nothing   Effect of Pain on Daily Activities unable to vacuum            [facility] PT Assessment - 11/24/15 0001      Assessment   Medical Diagnosis left breast cancer   Referring Provider Donne Hazel   Onset Date/Surgical Date 11/02/15   Hand Dominance Right   Prior Therapy none     Precautions   Precautions Other (comment)  at risk for lymphedema     Restrictions   Weight Bearing  Restrictions No     Balance Screen   Has the patient fallen in the past 6 months No   Has the patient had a decrease in activity level because of a fear of falling?  No   Is the patient reluctant to leave their home because of a fear of falling?  No     Home Ecologist residence   Living Arrangements Parent   Available Help at Discharge Family   Type of [place] - single point     Prior Function   Level of [place] Retired;On disability   Leisure pt does not exercise secondary to pain in L knee- pt states her dr told her no exercise other than recumbent  bike or swimming      Cognition   Overall Cognitive Status Within Functional Limits for tasks assessed     Observation/Other Assessments   Skin Integrity pt still has steri strips intact over left lumpectomy scar, left axillary scar presents with palpable scar tissue   Other Surveys  --  LLIS: 24% impairment     ROM / Strength   AROM / PROM / Strength Strength     AROM   Right Shoulder Flexion 165 Degrees   Right Shoulder ABduction 172 Degrees   Right Shoulder Internal Rotation 60 Degrees   Right Shoulder External Rotation 90 Degrees   Left Shoulder Flexion 160 Degrees   Left Shoulder ABduction 118 Degrees   Left Shoulder Internal Rotation 54 Degrees   Left Shoulder External Rotation 90 Degrees     Strength   Overall Strength Within functional limits for tasks performed  on R, left not assessed secondary to discomfort           LYMPHEDEMA/ONCOLOGY QUESTIONNAIRE - 11/24/15 0904      Type   Cancer Type left breast cancer     Surgeries   Lumpectomy Date 11/02/15   Sentinel Lymph Node Biopsy Date 11/02/15   Other Surgery Date 11/02/15  for hematoma in left axilla   Number Lymph Nodes Removed 3     Date Lymphedema/Swelling Started   Date 11/02/15     Treatment   Active Chemotherapy Treatment No  pt will find out tomorrow if she needs chemo   Past Chemotherapy Treatment No   Active Radiation Treatment No  pt will receive in future   Past Radiation Treatment No   Current Hormone Treatment No   Past Hormone Therapy No     What other symptoms do you have   Are you Having Heaviness or Tightness Yes   Are you having Pain No   Are you having pitting edema No   Is it Hard or Difficult finding clothes that fit No   Do you have infections No   Is there Decreased scar mobility --  unable to assess lumpectomy scar because still healing     Lymphedema Assessments   Lymphedema Assessments Upper extremities     Right Upper Extremity Lymphedema   15 cm Proximal to  Olecranon Process 37.1 cm   Olecranon Process 29.8 cm   15 cm Proximal to Ulnar Styloid Process 27.5 cm   Just Proximal to Ulnar Styloid Process 17.5 cm   Across Hand at [employer] 20.2 cm   At [place] of 2nd Digit 7 cm     Left Upper Extremity Lymphedema  15 cm Proximal to Olecranon Process 38.3 cm   Olecranon Process 31.3 cm   15 cm Proximal to Ulnar Styloid Process 27.5 cm   Just Proximal to Ulnar Styloid Process 17.5 cm   Across Hand at [employer] 20 cm   At [place] of 2nd Digit 6.9 cm                [facility] Adult PT Treatment/Exercise - 11/24/15 0001      Shoulder Exercises: Supine   ABduction AAROM;Left;10 reps;Other (comment)  with dowel, 5 sec holds                PT Education - 11/24/15 0935    Education provided Yes   Education Details anatomy and physiology of lymphatic system, lymphedema risk reduction strategies   Person(s) Educated Patient   Methods Explanation;Handout   Comprehension Verbalized understanding                [facility] Goals - 11/24/15 0944      CC Long Term Goal  #1   Title Pt will demonstrate 165 degrees of left shoulder abduction to allow her to reach items out to sides   Baseline 118   Time 4   Period Weeks   Status New     CC Long Term Goal  #2   Title Pt will be able to independently verbalize lymphedema risk reduction strategies   Time 4   Period Weeks   Status New     CC Long Term Goal  #3   Title Pt will be independent in a home exercise program for continued strengthening and stretching   Time 4   Period Weeks   Status New     CC Long Term Goal  #4   Title Pt will be independent in self drainage technique for management of localized swelling in left trunk   Time 4   Period Weeks   Status New     CC Long Term Goal  #5   Title Pt will report a 75% improvement in swelling in left trunk for improved comfort   Time 4   Period Weeks   Status New            Plan - 11/24/15 0936     Clinical Impression Statement Pt presents to PT with decreased ROM and strength of LUE and R trunk localized edema following a lumpectomy for treatment of left breast cancer. Pt demonstrates scar tissue under left axillary scar. Unable to assess scar tissue under lumpectomy scar as this scar is still healing. Pt also has localized swelling in left trunk that has been present since surgery. Following her surgery she had to have another surgery for a hematoma that developed in left axilla. This evaluation was of moderate complexity since she has diabetes that may impair healing and her condition is evolving because pt will have to complete radiation in the future.    Rehab Potential Excellent   PT Frequency 2x / week   PT Duration 4 weeks   PT Treatment/Interventions ADLs/Self Care Home Management;DME Instruction;Therapeutic exercise;Orthotic Fit/Training;Patient/family education;Manual lymph drainage;Manual techniques;Scar mobilization;Passive range of motion;Taping   PT Next Visit Plan begin MLD to left trunk, myofascial to axillary scar pending healing, AAROM/AROM/PROM to L shoulder   PT Home Exercise Plan supine dowel shoulder abduction   Consulted and Agree with Plan of Care Patient      Patient will benefit from skilled therapeutic intervention in order to improve the  following deficits and impairments:  Decreased knowledge of precautions, Increased edema, Impaired UE functional use, Decreased strength, Decreased range of motion, Decreased scar mobility  Visit Diagnosis: Stiffness of left shoulder, not elsewhere classified - Plan: PT plan of care cert/re-cert  Muscle weakness (generalized) - Plan: PT plan of care cert/re-cert  Localized edema - Plan: PT plan of care cert/re-cert      G-Codes - [id number]    Functional Assessment Tool Used LLIS   Functional Limitation Other PT primary   Other PT Primary Current Status [id number]) At least 20 percent but less than 40 percent impaired,  limited or restricted   Other PT Primary Goal Status [id number]) At least 1 percent but less than 20 percent impaired, limited or restricted       Problem List Patient Active Problem List   Diagnosis Date Noted  . Cancer of left female breast  ([place]) 11/02/2015  . Moderate episode of recurrent major depressive disorder ([place]) 10/22/2015  . Malignant neoplasm of upper inner quadrant of female breast ([place]) 10/14/2015  . Diabetes ([place]) 09/24/2015  . Hypertension     Alexia Freestone 11/24/2015, 9:50 AM  [place], [place], [address] Phone: [phone]   Fax:  [phone]  Name: ARCILIA AEBERSOLD MRN: [id number] Date of Birth: [birth year]  Allyson Sabal, PT 11/24/15 9:50 AM

## 2015-11-25 ENCOUNTER — Encounter (HOSPITAL_COMMUNITY): Payer: Medicare Other | Attending: Hematology & Oncology | Admitting: Hematology & Oncology

## 2015-11-25 ENCOUNTER — Encounter (HOSPITAL_COMMUNITY): Payer: Self-pay | Admitting: Hematology & Oncology

## 2015-11-25 VITALS — BP 100/60 | HR 82 | Temp 97.0°F | Resp 16 | Wt 268.2 lb

## 2015-11-25 DIAGNOSIS — C50212 Malignant neoplasm of upper-inner quadrant of left female breast: Secondary | ICD-10-CM | POA: Diagnosis not present

## 2015-11-25 DIAGNOSIS — M179 Osteoarthritis of knee, unspecified: Secondary | ICD-10-CM

## 2015-11-25 DIAGNOSIS — K5732 Diverticulitis of large intestine without perforation or abscess without bleeding: Secondary | ICD-10-CM | POA: Diagnosis not present

## 2015-11-25 DIAGNOSIS — Z79899 Other long term (current) drug therapy: Secondary | ICD-10-CM | POA: Insufficient documentation

## 2015-11-25 DIAGNOSIS — Z170 Estrogen receptor positive status [ER+]: Secondary | ICD-10-CM | POA: Insufficient documentation

## 2015-11-25 DIAGNOSIS — E119 Type 2 diabetes mellitus without complications: Secondary | ICD-10-CM | POA: Diagnosis not present

## 2015-11-25 NOTE — Progress Notes (Signed)
[place]  CONSULT NOTE  Patient Care Team: Monico Blitz, MD as PCP - General (Internal Medicine)  CHIEF COMPLAINTS/PURPOSE OF CONSULTATION:  Stage IIa L breast cancer   Malignant neoplasm of upper inner quadrant of female breast ([place])   09/23/2015 Mammogram    Digital diagnostic mammogram and Ultrasound. Area of shadowing L breast on U/S at 12:00 1 cm from nipple corresponds to distortion on mammography. 3 adjacent masses in L breast at 11:00 could represent benign cysts, indeterminate. These are in the UI quadrant of L breast      09/30/2015 Initial Biopsy    Ultrasound guided biopsy at 12 o clock and 11 o clock position      09/30/2015 Pathology Results    12 o clock position invasive ductal carcinoma low grade, localized DCIS, cribriform type with calcifications. 11 o clock fibrotic and inflamed breast tissue DCIS  ER> 90%, PR > 90%, HER 2 IHC 2+. FISH negative      10/14/2015 Imaging    MRI breasts 2.2 cm irregular enhancing mass in the 12 o'clock region of the left breast corresponding with the biopsied low grade invasive ductal carcinoma and ductal carcinoma in-situ. 1.1 cm irregular enhancement in the 11 o'clock region of the left breast corresponding with the biopsied ductal carcinoma in-situ with fibrotic and inflamed breast tissue.        HISTORY OF PRESENTING ILLNESS:  Lindsay Pacheco 55 y.o. female is here for a consultation due to newly diagnosed stage IIa left breast cancer. She notes that this was found on screening mammography and reports prior breast itching but no other symptoms, ie. No palpable abnormality. She saw Dr. Donne Hazel in consultation on 10/09/2015. Details of her imaging and diagnosis are noted above.   Patient is accompanied by her daughter and sister. She had a postop hematoma and "wore a drain tube for a week". Aaliyaha will follow-up with Dr. Donne Hazel in six months. She notes that overall she is making a good recovery.   She  started physical therapy yesterday. Scar tissue is causing her some discomfort, but was told physical therapy should help.  Patient reports an occasional sharp, stabbing pain in her breast.  MEDICAL HISTORY:  Past Medical History:  Diagnosis Date  . Anxiety   . Arthritis    "knees" (11/02/2015)  . Breast cancer, left breast ([place]) 09/30/2015  . Depression   . Diabetes mellitus, type II ([place])   . Diverticulitis 1987  . Hypertension   . Irritable bowel syndrome (IBS)   . Sleep apnea    does not use CPAP but "I'm suppose to" (11/02/2015)   No prior breast history  SURGICAL HISTORY: Past Surgical History:  Procedure Laterality Date  . BREAST BIOPSY Left 09/2015  . BREAST LUMPECTOMY WITH AXILLARY LYMPH NODE BIOPSY Left 11/02/2015   BREAST LUMPECTOMY WITH BRACKETED RADIOACTIVE SEED AND LEFT AXILLARY SENTINEL LYMPH NODE BIOPSY   . BREAST LUMPECTOMY WITH RADIOACTIVE SEED AND SENTINEL LYMPH NODE BIOPSY Left 11/02/2015   Procedure: LEFT BREAST LUMPECTOMY WITH BRACKETED RADIOACTIVE SEED AND LEFT AXILLARY SENTINEL LYMPH NODE BIOPSY;  Surgeon: [name], MD;  Location: [facility];  Service: General;  Laterality: Left;  . EVACUATION BREAST HEMATOMA Left 11/02/2015  . EVACUATION BREAST HEMATOMA Left 11/02/2015   Procedure: EVACUATION HEMATOMA BREAST;  Surgeon: [name], MD;  Location: [facility];  Service: General;  Laterality: Left;  . JOINT REPLACEMENT    . KNEE ARTHROSCOPY Right 2000s  . LAPAROSCOPIC CHOLECYSTECTOMY  1995  . TOTAL KNEE ARTHROPLASTY Right 2011    SOCIAL HISTORY: Social History   Social History  . Marital status: Divorced    Spouse name: N/A  . Number of children: N/A  . Years of education: N/A   Occupational History  . Not on file.   Social History Main Topics  . Smoking status: Never Smoker  . Smokeless tobacco: Never Used  . Alcohol use Yes     Comment: 11/02/2015 "maybe 4 drinks/year"  . Drug use:     Types:  Marijuana     Comment: 11/02/2015 "drug use in my past; now maybe 2 puffs 3-4 times/year; for social anxiety"  . Sexual activity: Not Currently   Other Topics Concern  . Not on file   Social History Narrative  . No narrative on file  Divorced 1 child, no Gc. Daughter graduates from [employer] December 15. Never smoked Likes to watch TV and read. She has arthritis in knees and it makes her difficult to do anything.  1 cat at home Used to work in Programmer, applications  FAMILY HISTORY: Family History  Problem Relation Age of Onset  . Anxiety disorder Father   . Depression Father   . Other Father     Abestosis  . Depression Sister   . Diabetes Mother   2 siblings both healthy  No cancer in immediate family; both parents are living (both 19 years old) One paternal aunt who had breast cancer in her 48s  ALLERGIES:  is allergic to adhesive [tape] and sulfa antibiotics.  MEDICATIONS:  Current Outpatient Prescriptions  Medication Sig Dispense Refill  . acetaminophen (TYLENOL) 500 MG tablet Take 1,000 mg by mouth every 6 (six) hours as needed for moderate pain or headache.    Marland Kitchen atorvastatin (LIPITOR) 10 MG tablet Take 10 mg by mouth daily.    . divalproex (DEPAKOTE ER) 500 MG 24 hr tablet Take 1 tablet (500 mg total) by mouth at bedtime. 30 tablet 0  . DULoxetine (CYMBALTA) 60 MG capsule Take 1 capsule (60 mg total) by mouth daily. 30 capsule 0  . lamoTRIgine (LAMICTAL) 25 MG tablet Take 50 mg by mouth daily.    Marland Kitchen lisinopril-hydrochlorothiazide (PRINZIDE,ZESTORETIC) 20-12.5 MG tablet Take 1 tablet by mouth daily.  6  . LORazepam (ATIVAN) 0.5 MG tablet Take 0.5 mg by mouth daily as needed for anxiety.    . metFORMIN (GLUCOPHAGE) 500 MG tablet Take 500 mg by mouth 2 (two) times daily with a meal.     . oxyCODONE (OXY IR/ROXICODONE) 5 MG immediate release tablet Take 1 tablet (5 mg total) by mouth every 6 (six) hours as needed for moderate pain, severe pain or breakthrough pain. 20 tablet 0   No  current facility-administered medications for this visit.     Review of Systems  Constitutional: Negative.   HENT: Negative.   Eyes: Negative.   Respiratory: Negative.   Cardiovascular: Negative.   Gastrointestinal: Negative.   Genitourinary: Negative.   Skin: Negative.        Sharp pain in breast Hematoma under arm  Neurological: Negative.   Endo/Heme/Allergies: Negative.   Psychiatric/Behavioral: Negative.   All other systems reviewed and are negative. 14 point ROS was done and is otherwise as detailed above or in HPI   PHYSICAL EXAMINATION: ECOG PERFORMANCE STATUS: 0 - Asymptomatic   Vitals with BMI 11/25/2015  Height   Weight 268 lbs 3 oz  BMI   Systolic [id number]  Diastolic 60  Pulse 82  Respirations  16    Physical Exam  Constitutional: She is oriented to person, place, and time and well-developed, well-nourished, and in no distress.  HENT:  Head: Normocephalic and atraumatic.  Nose: Nose normal.  Mouth/Throat: Oropharynx is clear and moist. No oropharyngeal exudate.  Eyes: Conjunctivae and EOM are normal. Pupils are equal, round, and reactive to light. Right eye exhibits no discharge. Left eye exhibits no discharge. No scleral icterus.  Neck: Normal range of motion. Neck supple. No tracheal deviation present. No thyromegaly present.  Cardiovascular: Normal rate, regular rhythm and normal heart sounds.  Exam reveals no gallop and no friction rub.   No murmur heard. Pulmonary/Chest: Effort normal and breath sounds normal. She has no wheezes. She has no rales.  Abdominal: Soft. Bowel sounds are normal. She exhibits no distension and no mass. There is no tenderness. There is no rebound and no guarding.  Musculoskeletal: Normal range of motion. She exhibits no edema.  Lymphadenopathy:    She has no cervical adenopathy.  Neurological: She is alert and oriented to person, place, and time. She has normal reflexes. No cranial nerve deficit. Gait normal. Coordination normal.    Skin: Skin is warm and dry. No rash noted.  Psychiatric: Mood, memory, affect and judgment normal.  Nursing note and vitals reviewed.   LABORATORY DATA:  I have reviewed the data as listed Lab Results  Component Value Date   WBC 11.5 (H) 11/02/2015   HGB 9.9 (L) 11/02/2015   HCT 30.5 (L) 11/02/2015   MCV 83.3 11/02/2015   PLT 252 11/02/2015   CMP     Component Value Date/Time   NA 137 10/28/2015 1311   K 3.6 10/28/2015 1311   CL 103 10/28/2015 1311   CO2 23 10/28/2015 1311   GLUCOSE 176 (H) 10/28/2015 1311   BUN 8 10/28/2015 1311   CREATININE 0.92 10/28/2015 1311   CALCIUM 9.5 10/28/2015 1311   PROT 7.3 10/06/2009 1040   ALBUMIN 3.9 10/06/2009 1040   AST 22 10/06/2009 1040   ALT 22 10/06/2009 1040   ALKPHOS 120 (H) 10/06/2009 1040   BILITOT 0.6 10/06/2009 1040   GFRNONAA >60 10/28/2015 1311   GFRAA >60 10/28/2015 1311     RADIOGRAPHIC STUDIES: I have personally reviewed the radiological images as listed and agreed with the findings in the report. No results found.  Study Result   CLINICAL DATA:  Ultrasound-guided core biopsy of a mass in the 12 o'clock region of the left breast revealed low grade invasive ductal carcinoma as well as ductal carcinoma in-situ with calcifications. Ultrasound-guided core biopsy of the mass in the 11 o'clock region of the left breast revealed ductal carcinoma in-situ as well as fibrotic and inflamed breast tissue.  LABS:  Creatinine was obtained on site at [facility] at [address]Results: Creatinine 0.8 mg/dL.  EXAM: BILATERAL BREAST MRI WITH AND WITHOUT CONTRAST  TECHNIQUE: Multiplanar, multisequence MR images of both breasts were obtained prior to and following the intravenous administration of 20 ml of MultiHance.  THREE-DIMENSIONAL MR IMAGE RENDERING ON INDEPENDENT WORKSTATION:  Three-dimensional MR images were rendered by post-processing of the original MR data on an independent workstation.  The three-dimensional MR images were interpreted, and findings are reported in the following complete MRI report for this study. Three dimensional images were evaluated at the independent DynaCad workstation  COMPARISON:  Previous exam(s).  FINDINGS: Breast composition: c. Heterogeneous fibroglandular tissue.  Background parenchymal enhancement: Marked enhancing fibronodular pattern, bilaterally.  Right breast: No mass or abnormal  enhancement.  Left breast: In the 12 o'clock region of the left breast there is an irregular spiculated enhancing mass measuring 2.2 x 1.5 x 1.3 cm. There is a signal void artifact in the mass from the biopsy clip. In the upper-inner quadrant of the left breast at 11 o'clock there is asymmetric irregular enhancement measuring 1.1 x 0.8 x 0.7 cm. Signal void artifact is seen in the enhancement from the biopsy clip. Both enhancing masses are seen in a marked background of fibronodular enhancement obscuring borders. A benign intramammary lymph node is seen in the lateral aspect of the breast.  Lymph nodes: No abnormal appearing lymph nodes.  Ancillary findings:  None.  IMPRESSION: 2.2 cm irregular enhancing mass in the 12 o'clock region of the left breast corresponding with the biopsied low grade invasive ductal carcinoma and ductal carcinoma in-situ. 1.1 cm irregular enhancement in the 11 o'clock region of the left breast corresponding with the biopsied ductal carcinoma in-situ with fibrotic and inflamed breast tissue.  RECOMMENDATION: Treatment planning of the known left breast cancer is recommended.  BI-RADS CATEGORY  6: Known biopsy-proven malignancy.   Electronically Signed   By: Lillia Mountain M.D.   On: 10/14/2015 11:32     PATHOLOGY: 11/10/15  11/02/15             09/30/15    ASSESSMENT & PLAN:  Malignant neoplasm of upper inner quadrant of female breast [facility])   Staging form: Breast, AJCC 7th Edition   -  Clinical stage from 10/14/2015: Stage IIA (T2, N0, M0) - Signed by Kyung Rudd, MD on 10/14/2015 ER+ PR+ HER 2 - Arthritis Diverticulitis Diabetes   Patient appears well. I went over results of oncotype with patient.We reviewed her surgical pathology and staging. She is ER+, PR+ and I again reviewed that she qualifies for endocrine therapy.  She will need radiation and endocrine therapy. I briefly discussed the side effects of aromatase inhibitor and explained to her that we will be following bone density as she is on it. I went ahead and ordered a DEXA today.  We discussed the risks and benefits of anti-estrogen therapy with aromatase inhibitors. These include but not limited to insomnia, hot flashes, mood changes, vaginal dryness, bone density loss, and weight gain. Although rare, serious side effects including endometrial cancer, risk of blood clots were also discussed. We strongly believe that the benefits far outweigh the risks. Patient understands these risks and consented to starting treatment. Planned treatment duration is 5 years. She will start post radiation.   I will refer her to Dr. Lisbeth Renshaw for radiation.   I recommended [employer] once it starts in January, and I explained the decreased risk of recurrence in women who exercise.   I explained to [place] that the sharp pain she experiences in her breast is currently normal.   Follow up with patient after she completes radiation.   Orders Placed This Encounter  Procedures  . DG Bone Density    JH/AMY       Standing Status:   Future    Number of Occurrences:   1    Standing Expiration Date:   11/24/2016    Order Specific Question:   Reason for Exam (SYMPTOM  OR DIAGNOSIS REQUIRED)    Answer:   high risk medication    Order Specific Question:   Is the patient pregnant?    Answer:   No    Order Specific Question:   Preferred imaging location?    Answer:  [facility]     This document serves as a record of services  personally performed by Ancil Linsey, MD. It was created on her behalf by Elmyra Ricks, a trained medical scribe. The creation of this record is based on the scribe's personal observations and the provider's statements to them. This document has been checked and approved by the attending provider.  I have reviewed the above documentation for accuracy and completeness and I agree with the above.   This note was electronically signed.    Molli Hazard, MD   11/25/2015 9:46 AM

## 2015-11-25 NOTE — Patient Instructions (Addendum)
[place] at [facility] Discharge Instructions  RECOMMENDATIONS MADE BY THE CONSULTANT AND ANY TEST RESULTS WILL BE SENT TO YOUR REFERRING PHYSICIAN.  You saw Dr.Penland today. Will refer back to Dr. Lisbeth Renshaw at [facility] for radiation. Dexa scan will be scheduled. Follow up in 8 weeks with Dr. Whitney Muse. See Amy at checkout for appointments.  Thank you for choosing [place] at [facility] to provide your oncology and hematology care.  To afford each patient quality time with our provider, please arrive at least 15 minutes before your scheduled appointment time.   Beginning January 23rd 2017 lab work for the [facility] will be done in the  Main lab at [facility] on 1st floor. If you have a lab appointment with the [facility] please come in thru the  Main Entrance and check in at the main information desk  You need to re-schedule your appointment should you arrive 10 or more minutes late.  We strive to give you quality time with our providers, and arriving late affects you and other patients whose appointments are after yours.  Also, if you no show three or more times for appointments you may be dismissed from the clinic at the providers discretion.     Again, thank you for choosing [facility].  Our hope is that these requests will decrease the amount of time that you wait before being seen by our physicians.       _____________________________________________________________  Should you have questions after your visit to [facility], please contact our office at [phone] between the hours of 8:30 a.m. and 4:30 p.m.  Voicemails left after 4:30 p.m. will not be returned until the following business day.  For prescription refill requests, have your pharmacy contact our office.         Resources For Cancer Patients and their Caregivers ? American Cancer Society: Can assist with transportation, wigs, general needs,  runs Look Good Feel Better.        847-446-3164 ? Cancer Care: Provides financial assistance, online support groups, medication/co-pay assistance.  1-800-813-HOPE 667-554-4495) ? [facility] Assists [place] cancer patients and their families through emotional , educational and financial support.  (613) 409-7712 ? [place] DSS Where to apply for food stamps, Medicaid and utility assistance. 220-738-4934 ? [facility]: Transportation to medical appointments. (606) 181-9472 ? Social Security Administration: May apply for disability if have a Stage IV cancer. 534-867-2427 480-034-4051 ? [employer], Disability and Transit Services: Assists with nutrition, care and transit needs. [place] Support Programs: @10RELATIVEDAYS @ > Cancer Support Group  2nd Tuesday of the month 1pm-2pm, Journey Room  > Creative Journey  3rd Tuesday of the month 1130am-1pm, Journey Room  > Look Good Feel Better  1st Wednesday of the month 10am-12 noon, Journey Room (Call [place] to register [phone])

## 2015-12-01 ENCOUNTER — Ambulatory Visit: Payer: Medicare Other

## 2015-12-01 NOTE — Progress Notes (Signed)
Follow up New consult  Left Breast Cancer  Upper quadrant, ER/PR positive,  Oncotype results=18   11/01/17 Dr. [name], MD performed lumpectomy , and left axilla developed a hematoma and was evacuated by MD and j/p drain  In place,  and patient stayed overnight,    FINAL DIAGNOSIS Diagnosis 11/02/2015: Dr. [name], MD 1. Breast, lumpectomy, Left - INVASIVE DUCTAL CARCINOMA, GRADE I/III SPANNING AT LEAST 2.2 CM. - INVASIVE CARCINOMA IS FOCALLY 0.1 CM TO THE LATERAL MARGIN OF SPECIMEN #1. - SEE ONCOLOGY TABLE BELOW. 2. Lymph node, sentinel, biopsy, Left axillary - THERE IS NO EVIDENCE OF CARCINOMA IN 1 OF 1 LYMPH NODE (0/1). - SEE COMMENT. 3. Lymph node, sentinel, biopsy, Left axillary - THERE IS NO EVIDENCE OF CARCINOMA IN 1 OF 1 LYMPH NODE (0/1). - SEE COMMENT. 4. Lymph node, sentinel, biopsy, Left axillary - THERE IS NO EVIDENCE OF CARCINOMA IN 1 OF 1 LYMPH NODE (0/1). - SEE COMMENT. 5. Breast, excision, Left medial margin - LOBULAR NEOPLASIA (ATYPICAL LOBULAR HYPERPLASIA). - RADIAL SCAR(S). - FIBROCYSTIC CHANGES WITH CALCIFICATIONS. - INTRADUCTAL PAPILLOMA. - SEE COMMENT. 6. Breast, excision, Left superior margin - FIBROCYSTIC CHANGES WITH ADENOSIS AND CALCIFICATIONS. - USUAL DUCTAL HYPERPLASIA. - THERE IS NO EVIDENCE OF MALIGNANCY. - SEE COMMENT. 7. Breast, excision, Left lateral margin - FIBROCYSTIC CHANGES. - RADIAL SCAR(S). - USUAL DUCTAL HYPERPLASIA. - THERE IS NO EVIDENCE OF MALIGNANCY. - SEE COMMENT. 8. Breast, excision, Left inferior margin - FIBROCYSTIC CHANGES. - THERE IS NO EVIDENCE OF MALIGNANCY. - SEE COMMENT. 2 of 5 FINAL for Tristan, Ariadne C [id number]) Diagnosis(continued) 9. Breast, excision, Left posterior margin - BENIGN BREAST PARENCHYMA. - THERE IS NO EVIDENCE  11/24/15 Physical Therapy  ,  Numbness and tightness on left side breast area , swelling on left arm, ,      Medical Oncology:Dr. Ancil Linsey, MD appt  01/11/2016 BP 106/78 (BP Location: Right Arm, Patient Position: Sitting, Cuff Size: Large)   Pulse 78   Temp 97.8 F (36.6 C) (Oral)   Resp 20   Ht 5\' 7"  (1.702 m)   Wt 265 lb 9.6 oz (120.5 kg)   BMI 41.60 kg/m   Wt Readings from Last 3 Encounters:  12/03/15 265 lb 9.6 oz (120.5 kg)  11/25/15 268 lb 3.2 oz (121.7 kg)  11/02/15 270 lb 8.1 oz (122.7 kg)

## 2015-12-03 ENCOUNTER — Ambulatory Visit
Admission: RE | Admit: 2015-12-03 | Discharge: 2015-12-03 | Disposition: A | Discharge status: Home / Self Care | Payer: Medicare Other | Source: Ambulatory Visit | Attending: Radiation Oncology | Admitting: Radiation Oncology

## 2015-12-03 ENCOUNTER — Ambulatory Visit: Payer: Medicare Other

## 2015-12-03 ENCOUNTER — Encounter: Payer: Self-pay | Admitting: Radiation Oncology

## 2015-12-03 DIAGNOSIS — Z510 Encounter for antineoplastic radiation therapy: Secondary | ICD-10-CM | POA: Diagnosis not present

## 2015-12-03 DIAGNOSIS — C50211 Malignant neoplasm of upper-inner quadrant of right female breast: Secondary | ICD-10-CM

## 2015-12-03 DIAGNOSIS — C50212 Malignant neoplasm of upper-inner quadrant of left female breast: Principal | ICD-10-CM

## 2015-12-03 DIAGNOSIS — M25612 Stiffness of left shoulder, not elsewhere classified: Secondary | ICD-10-CM

## 2015-12-03 DIAGNOSIS — R600 Localized edema: Secondary | ICD-10-CM

## 2015-12-03 NOTE — Therapy (Signed)
[place], [place], [address] Phone: [phone]   Fax:  [phone]  Physical Therapy Treatment  Patient Details  Name: Lindsay Pacheco MRN: [id number] Date of Birth: Feb 05, 1960 Referring Provider: Donne Hazel  Encounter Date: 12/03/2015      PT End of Session - 12/03/15 0934    Visit Number 2   Number of Visits 9   Date for PT Re-Evaluation 12/22/15   PT Start Time 0849   PT Stop Time 0933   PT Time Calculation (min) 44 min   Activity Tolerance Patient tolerated treatment well   Behavior During Therapy [facility] for tasks assessed/performed      Past Medical History:  Diagnosis Date  . Anxiety   . Arthritis    "knees" (11/02/2015)  . Breast cancer, left breast ([place]) 09/30/2015  . Depression   . Diabetes mellitus, type II ([place])   . Diverticulitis 1987  . Hypertension   . Irritable bowel syndrome (IBS)   . Sleep apnea    does not use CPAP but "I'm suppose to" (11/02/2015)    Past Surgical History:  Procedure Laterality Date  . BREAST BIOPSY Left 09/2015  . BREAST LUMPECTOMY WITH AXILLARY LYMPH NODE BIOPSY Left 11/02/2015   BREAST LUMPECTOMY WITH BRACKETED RADIOACTIVE SEED AND LEFT AXILLARY SENTINEL LYMPH NODE BIOPSY   . BREAST LUMPECTOMY WITH RADIOACTIVE SEED AND SENTINEL LYMPH NODE BIOPSY Left 11/02/2015   Procedure: LEFT BREAST LUMPECTOMY WITH BRACKETED RADIOACTIVE SEED AND LEFT AXILLARY SENTINEL LYMPH NODE BIOPSY;  Surgeon: [name], MD;  Location: [place];  Service: General;  Laterality: Left;  . EVACUATION BREAST HEMATOMA Left 11/02/2015  . EVACUATION BREAST HEMATOMA Left 11/02/2015   Procedure: EVACUATION HEMATOMA BREAST;  Surgeon: [name], MD;  Location: [place];  Service: General;  Laterality: Left;  . JOINT REPLACEMENT    . KNEE ARTHROSCOPY Right 2000s  . LAPAROSCOPIC CHOLECYSTECTOMY  1995  . TOTAL KNEE ARTHROPLASTY Right 2011     There were no vitals filed for this visit.      Subjective Assessment - 12/03/15 0853    Subjective I had to cancel my visit earlier this week due to having a bad sinus infection but I'm feeling better now. I think I did too much day before yesterday so my arm was really sore yesterday and I think it's swollen today.   Pertinent History breast cancer left upper quadrant, er/pr positive, left lumpectomy 11/02/15, pt will see oncologist tomorrow to determine if she will need chemo, she will have to have radiation in the future, R TKA   Patient Stated Goals to get back to normal   Currently in Pain? No/denies               LYMPHEDEMA/ONCOLOGY QUESTIONNAIRE - 12/03/15 0856      Left Upper Extremity Lymphedema   15 cm Proximal to Olecranon Process 39.2 cm   Olecranon Process 31.2 cm   15 cm Proximal to Ulnar Styloid Process 28.3 cm   Just Proximal to Ulnar Styloid Process 17.7 cm   Across Hand at [employer] 20.6 cm   At [place] of 2nd Digit 7 cm                  [facility] Adult PT Treatment/Exercise - 12/03/15 0001      Manual Therapy   Manual Lymphatic Drainage (MLD) In Supine: Short neck, superficial and deep abdominals, Rt axilla and Lt inguinal nodes, Lt axillo-inguinal and  anterior inter-axillary anastomosis, then Lt UE from dorsal hand to lateral shoulder working proximal to distal then retracing all steps.   Passive ROM In Supine to Lt shoulder into flexion, abduction and er to pts tolerance.                         [facility] Goals - 12/03/15 [phone]      CC Long Term Goal  #1   Title Pt will demonstrate 165 degrees of left shoulder abduction to allow her to reach items out to sides   Baseline 118; P/ROM with stretching approximately 130 degrees-12/03/15   Status On-going     CC Long Term Goal  #2   Title Pt will be able to independently verbalize lymphedema risk reduction strategies   Status On-going     CC Long Term Goal  #3    Title Pt will be independent in a home exercise program for continued strengthening and stretching   Status On-going     CC Long Term Goal  #4   Title Pt will be independent in self drainage technique for management of localized swelling in left trunk   Status On-going     CC Long Term Goal  #5   Title Pt will report a 75% improvement in swelling in left trunk for improved comfort   Baseline Pt reported this did feel a little better today but did not quantify-12/03/15   Status On-going            Plan - 12/03/15 0935    Clinical Impression Statement Pt did very well with first session of stretching and manual lymph drainage. She felt her Lt UE was a little more swollen from moving alot of boxes 2 days ago getting ready for Christmas so upon remeasuring found her olecranon and upper arm were both mildly increased so included her UE with manual lymph drianage today. Pt reported feeling much looser after session. Encouraged her to continue dowel rod exercises but make sure she is relaxed when doing them and to be careful with moving boxes pacing herself better. She has an appt today to discuss her upcoming radiation, possibly to include start date.   Rehab Potential Excellent   Clinical Impairments Affecting Rehab Potential upcoming radiation   PT Frequency 2x / week   PT Duration 4 weeks   PT Treatment/Interventions ADLs/Self Care Home Management;DME Instruction;Therapeutic exercise;Orthotic Fit/Training;Patient/family education;Manual lymph drainage;Manual techniques;Scar mobilization;Passive range of motion;Taping   PT Next Visit Plan Cont MLD to left trunk and UE, issue gray foam to wear at the lateral aspect of her compression bra as this is really tight and could be impeding lymph flow, myofascial to axillary scar, and AAROM/AROM/PROM to Lt shoulder. Progress HEP prn.    PT Home Exercise Plan supine dowel shoulder abduction   Consulted and Agree with Plan of Care Patient       Patient will benefit from skilled therapeutic intervention in order to improve the following deficits and impairments:  Decreased knowledge of precautions, Increased edema, Impaired UE functional use, Decreased strength, Decreased range of motion, Decreased scar mobility  Visit Diagnosis: Stiffness of left shoulder, not elsewhere classified  Localized edema     Problem List Patient Active Problem List   Diagnosis Date Noted  . Cancer of left female breast  ([place]) 11/02/2015  . Moderate episode of recurrent major depressive disorder ([place]) 10/22/2015  . Malignant neoplasm of upper inner quadrant of female breast ([place]) 10/14/2015  .  Diabetes ([place]) 09/24/2015  . Hypertension     Otelia Limes, PTA 12/03/2015, 9:44 AM  [place], [place], [address] Phone: [phone]   Fax:  [phone]  Name: ARIANIS DUBINSKI MRN: [id number] Date of Birth: 06-22-1960

## 2015-12-03 NOTE — Progress Notes (Signed)
Radiation Oncology         [phone] ________________________________  Name: Lindsay Pacheco MRN: [id number]  Date: 12/03/2015  DOB: April 05, 1960  Follow-Up Visit Note  CC: Monico Blitz, MD  Patrici Ranks, MD  Diagnosis: Stage IIA (pT2, pN0) grade 1 invasive ductal carcinoma of the left breast (ER/PR+, HER2-)  Narrative:  The patient returns today to further discuss radiation for the management of her disease.  The patient was originally seen on 10/14/15. She was felt to be a good candidate for breast conservation treatment. The patient also consulted with Dr. Whitney Muse on 10/15/15.  She has completed surgery consisting of a left lumpectomy and sentinel lymph node biopsy on 11/02/15 by Dr. Donne Hazel. Pathology revealed grade 1 IDC measuring 2.2 cm with the invasive carcinoma focally 0.1 cm to the lateral margin. Excision of the left superior margin, left lateral margin, left inferior margin, and left posterior margin showed no evidence of malignancy. Excision of the left medial margin revealed lobular neoplasia. Since the final left lateral margin is negative for malignancy, all margins are deemed surgically negative for invasive carcinoma. 0/3 left axillary sentinel lymph nodes were negative.  Oncotype DX score of the tumor was 18: low-intermediate risk, therefore chemotherapy is not recommended. After surgery, the patient developed a seroma and this had to be drained.  ALLERGIES:  is allergic to adhesive [tape] and sulfa antibiotics.  Meds: Current Outpatient Prescriptions  Medication Sig Dispense Refill  . acetaminophen (TYLENOL) 500 MG tablet Take 1,000 mg by mouth every 6 (six) hours as needed for moderate pain or headache.    Marland Kitchen atorvastatin (LIPITOR) 10 MG tablet Take 10 mg by mouth daily.    . DULoxetine (CYMBALTA) 60 MG capsule Take 1 capsule (60 mg total) by mouth daily. 30 capsule 0  . lamoTRIgine (LAMICTAL) 25 MG tablet Take 25 mg by mouth daily.     Marland Kitchen  lisinopril-hydrochlorothiazide (PRINZIDE,ZESTORETIC) 20-12.5 MG tablet Take 1 tablet by mouth daily.  6  . LORazepam (ATIVAN) 0.5 MG tablet Take 0.5 mg by mouth daily as needed for anxiety.    . metFORMIN (GLUCOPHAGE) 500 MG tablet Take 500 mg by mouth 2 (two) times daily with a meal.      No current facility-administered medications for this encounter.     Physical Findings: The patient is in no acute distress. Patient is alert and oriented.  height is '5\' 7"'  (1.702 m) and weight is 265 lb 9.6 oz (120.5 kg). Her oral temperature is 97.8 F (36.6 C). Her blood pressure is 106/78 and her pulse is 78. Her respiration is 20.  The patient is s/p left lumpectomy. Well healed surgical incision centrally above the nipple. Well healed left axillary incision.  Lab Findings: Lab Results  Component Value Date   WBC 11.5 (H) 11/02/2015   HGB 9.9 (L) 11/02/2015   HCT 30.5 (L) 11/02/2015   MCV 83.3 11/02/2015   PLT 252 11/02/2015     Radiographic Findings: No results found.  Impression:    The patient is status post lumpectomy as part of her breast conservation treatment strategy. The patient is appropriate to proceed with adjuvant radiation treatment at this time.  I discussed with the patient the role of radiation treatment in this setting. We discussed the expected benefit in terms of local/regional control area we also discussed the possible side effects and risks of treatment. All of her questions were answered.  We also discussed the logistics of treatment. The patient wishes to proceed with simulation.  Plan:  The patient signed a consent form and a copy was placed in her medical chart. The patient will be scheduled for a simulation next week such that we can begin treatment planning. I anticipate treating the patient with a 6 1/2 week course of radiation treatment. This will correspond to whole breast radiation treatment to the left breast using tangent fields. Of note, the patient will be  out of town on 12/14 and 12/15 due to her daughter graduating from college. Therefore we will begin treatment the week of December 18th. The patient is scheduled to follow up with Dr. Whitney Muse on 01/21/16.  [name], M.D., Ph.D.  This document serves as a record of services personally performed by Kyung Rudd, MD. It was created on his behalf by Darcus Austin, a trained medical scribe. The creation of this record is based on the scribe's personal observations and the provider's statements to them. This document has been checked and approved by the attending provider.

## 2015-12-03 NOTE — Progress Notes (Signed)
Please see the Nurse Progress Note in the MD Initial Consult Encounter for this patient. 

## 2015-12-07 ENCOUNTER — Ambulatory Visit (HOSPITAL_COMMUNITY)
Admission: RE | Admit: 2015-12-07 | Discharge: 2015-12-07 | Disposition: A | Discharge status: Home / Self Care | Payer: Medicare Other | Source: Ambulatory Visit | Attending: Hematology & Oncology | Admitting: Hematology & Oncology

## 2015-12-07 DIAGNOSIS — C50212 Malignant neoplasm of upper-inner quadrant of left female breast: Secondary | ICD-10-CM | POA: Insufficient documentation

## 2015-12-07 DIAGNOSIS — Z79899 Other long term (current) drug therapy: Secondary | ICD-10-CM | POA: Diagnosis present

## 2015-12-07 DIAGNOSIS — Z170 Estrogen receptor positive status [ER+]: Secondary | ICD-10-CM | POA: Insufficient documentation

## 2015-12-08 ENCOUNTER — Ambulatory Visit: Payer: Medicare Other | Attending: General Surgery | Admitting: Physical Therapy

## 2015-12-08 ENCOUNTER — Encounter: Payer: Self-pay | Admitting: Physical Therapy

## 2015-12-08 DIAGNOSIS — M6281 Muscle weakness (generalized): Secondary | ICD-10-CM | POA: Diagnosis present

## 2015-12-08 DIAGNOSIS — M25612 Stiffness of left shoulder, not elsewhere classified: Secondary | ICD-10-CM | POA: Insufficient documentation

## 2015-12-08 DIAGNOSIS — R600 Localized edema: Secondary | ICD-10-CM | POA: Insufficient documentation

## 2015-12-08 NOTE — Therapy (Signed)
[place], [place], [address] Phone: [phone]   Fax:  [phone]  Physical Therapy Treatment  Patient Details  Name: Lindsay Pacheco MRN: [id number] Date of Birth: 23-Dec-1960 Referring Provider: Donne Hazel  Encounter Date: 12/08/2015      PT End of Session - 12/08/15 1348    Visit Number 3   Number of Visits 9   Date for PT Re-Evaluation 12/22/15   PT Start Time 1301   PT Stop Time 1345   PT Time Calculation (min) 44 min   Activity Tolerance Patient tolerated treatment well   Behavior During Therapy [facility] for tasks assessed/performed      Past Medical History:  Diagnosis Date  . Anxiety   . Arthritis    "knees" (11/02/2015)  . Breast cancer, left breast ([place]) 09/30/2015  . Depression   . Diabetes mellitus, type II ([place])   . Diverticulitis 1987  . Hypertension   . Irritable bowel syndrome (IBS)   . Sleep apnea    does not use CPAP but "I'm suppose to" (11/02/2015)    Past Surgical History:  Procedure Laterality Date  . BREAST BIOPSY Left 09/2015  . BREAST LUMPECTOMY WITH AXILLARY LYMPH NODE BIOPSY Left 11/02/2015   BREAST LUMPECTOMY WITH BRACKETED RADIOACTIVE SEED AND LEFT AXILLARY SENTINEL LYMPH NODE BIOPSY   . BREAST LUMPECTOMY WITH RADIOACTIVE SEED AND SENTINEL LYMPH NODE BIOPSY Left 11/02/2015   Procedure: LEFT BREAST LUMPECTOMY WITH BRACKETED RADIOACTIVE SEED AND LEFT AXILLARY SENTINEL LYMPH NODE BIOPSY;  Surgeon: [name], MD;  Location: [place];  Service: General;  Laterality: Left;  . EVACUATION BREAST HEMATOMA Left 11/02/2015  . EVACUATION BREAST HEMATOMA Left 11/02/2015   Procedure: EVACUATION HEMATOMA BREAST;  Surgeon: [name], MD;  Location: [place];  Service: General;  Laterality: Left;  . JOINT REPLACEMENT    . KNEE ARTHROSCOPY Right 2000s  . LAPAROSCOPIC CHOLECYSTECTOMY  1995  . TOTAL KNEE ARTHROPLASTY Right 2011     There were no vitals filed for this visit.      Subjective Assessment - 12/08/15 1303    Subjective I just did not realize how much numbness and sharp pains I would have. My oncologist says it is normal.    Pertinent History breast cancer left upper quadrant, er/pr positive, left lumpectomy 11/02/15, pt will see oncologist tomorrow to determine if she will need chemo, she will have to have radiation in the future, R TKA   Patient Stated Goals to get back to normal   Currently in Pain? Yes   Pain Score 4    Pain Location Elbow   Pain Orientation Medial   Pain Descriptors / Indicators Dull   Pain Type Chronic pain                         [facility] Adult PT Treatment/Exercise - 12/08/15 0001      Manual Therapy   Manual Lymphatic Drainage (MLD) In Supine: Short neck, superficial and deep abdominals, Rt axilla and Lt inguinal nodes, Lt axillo-inguinal and anterior inter-axillary anastomosis, then Lt UE from dorsal hand to lateral shoulder working proximal to distal then retracing all steps.   Passive ROM In Supine to Lt shoulder into flexion, abduction to pt's tolerance                        [facility] Goals - 12/03/15 [id number]  CC Long Term Goal  #1   Title Pt will demonstrate 165 degrees of left shoulder abduction to allow her to reach items out to sides   Baseline 118; P/ROM with stretching approximately 130 degrees-12/03/15   Status On-going     CC Long Term Goal  #2   Title Pt will be able to independently verbalize lymphedema risk reduction strategies   Status On-going     CC Long Term Goal  #3   Title Pt will be independent in a home exercise program for continued strengthening and stretching   Status On-going     CC Long Term Goal  #4   Title Pt will be independent in self drainage technique for management of localized swelling in left trunk   Status On-going     CC Long Term Goal  #5   Title Pt will report a 75% improvement in  swelling in left trunk for improved comfort   Baseline Pt reported this did feel a little better today but did not quantify-12/03/15   Status On-going            Plan - 12/08/15 1348    Clinical Impression Statement Pt was complaining of pain at medial elbow again today. During PROM cording could be palpated in antecubital fossa which is most likely causing the pain she has been having at her elbow. PPOM performed to left shoulder in the direction of flexion and abduction. After session pt felt less pulling and pain in elbow. Manual lymphatic drainage performed to LUE with focus on L trunk.    Rehab Potential Excellent   Clinical Impairments Affecting Rehab Potential upcoming radiation   PT Frequency 2x / week   PT Duration 4 weeks   PT Treatment/Interventions ADLs/Self Care Home Management;DME Instruction;Therapeutic exercise;Orthotic Fit/Training;Patient/family education;Manual lymph drainage;Manual techniques;Scar mobilization;Passive range of motion;Taping   PT Next Visit Plan Cont MLD to left trunk and UE, issue gray foam to wear at the lateral aspect of her compression bra as this is really tight and could be impeding lymph flow, myofascial to axillary scar, and AAROM/AROM/PROM to Lt shoulder. Progress HEP prn.    PT Home Exercise Plan supine dowel shoulder abduction   Consulted and Agree with Plan of Care Patient      Patient will benefit from skilled therapeutic intervention in order to improve the following deficits and impairments:  Decreased knowledge of precautions, Increased edema, Impaired UE functional use, Decreased strength, Decreased range of motion, Decreased scar mobility  Visit Diagnosis: Stiffness of left shoulder, not elsewhere classified  Localized edema     Problem List Patient Active Problem List   Diagnosis Date Noted  . Cancer of left female breast  ([place]) 11/02/2015  . Moderate episode of recurrent major depressive disorder ([place]) 10/22/2015  .  Malignant neoplasm of upper inner quadrant of female breast ([place]) 10/14/2015  . Diabetes ([place]) 09/24/2015  . Hypertension     Alexia Freestone 12/08/2015, 3:49 PM  [place], [place], [address] Phone: [phone]   Fax:  [phone]  Name: Lindsay Pacheco MRN: [id number] Date of Birth: April 27, 1960   Allyson Sabal, PT 12/08/15 3:49 PM

## 2015-12-09 ENCOUNTER — Ambulatory Visit (HOSPITAL_COMMUNITY)
Admission: RE | Admit: 2015-12-09 | Discharge: 2015-12-09 | Disposition: A | Discharge status: Home / Self Care | Payer: Medicare Other | Source: Ambulatory Visit | Attending: Internal Medicine | Admitting: Internal Medicine

## 2015-12-09 ENCOUNTER — Encounter (HOSPITAL_COMMUNITY): Payer: Self-pay

## 2015-12-09 ENCOUNTER — Encounter (HOSPITAL_COMMUNITY)
Admission: RE | Disposition: A | Discharge status: Home / Self Care | Payer: Self-pay | Source: Ambulatory Visit | Attending: Internal Medicine

## 2015-12-09 DIAGNOSIS — Z9660 Presence of unspecified orthopedic joint implant: Secondary | ICD-10-CM | POA: Insufficient documentation

## 2015-12-09 DIAGNOSIS — G4730 Sleep apnea, unspecified: Secondary | ICD-10-CM | POA: Insufficient documentation

## 2015-12-09 DIAGNOSIS — K648 Other hemorrhoids: Secondary | ICD-10-CM | POA: Insufficient documentation

## 2015-12-09 DIAGNOSIS — Z91048 Other nonmedicinal substance allergy status: Secondary | ICD-10-CM | POA: Diagnosis not present

## 2015-12-09 DIAGNOSIS — Z818 Family history of other mental and behavioral disorders: Secondary | ICD-10-CM | POA: Diagnosis not present

## 2015-12-09 DIAGNOSIS — I10 Essential (primary) hypertension: Secondary | ICD-10-CM | POA: Insufficient documentation

## 2015-12-09 DIAGNOSIS — K5730 Diverticulosis of large intestine without perforation or abscess without bleeding: Secondary | ICD-10-CM | POA: Insufficient documentation

## 2015-12-09 DIAGNOSIS — K589 Irritable bowel syndrome without diarrhea: Secondary | ICD-10-CM | POA: Diagnosis not present

## 2015-12-09 DIAGNOSIS — K5732 Diverticulitis of large intestine without perforation or abscess without bleeding: Secondary | ICD-10-CM

## 2015-12-09 DIAGNOSIS — F329 Major depressive disorder, single episode, unspecified: Secondary | ICD-10-CM | POA: Diagnosis not present

## 2015-12-09 DIAGNOSIS — Z8489 Family history of other specified conditions: Secondary | ICD-10-CM | POA: Insufficient documentation

## 2015-12-09 DIAGNOSIS — K621 Rectal polyp: Secondary | ICD-10-CM | POA: Diagnosis not present

## 2015-12-09 DIAGNOSIS — K625 Hemorrhage of anus and rectum: Secondary | ICD-10-CM

## 2015-12-09 DIAGNOSIS — K644 Residual hemorrhoidal skin tags: Secondary | ICD-10-CM

## 2015-12-09 DIAGNOSIS — Z9049 Acquired absence of other specified parts of digestive tract: Secondary | ICD-10-CM | POA: Insufficient documentation

## 2015-12-09 DIAGNOSIS — E119 Type 2 diabetes mellitus without complications: Secondary | ICD-10-CM | POA: Diagnosis not present

## 2015-12-09 DIAGNOSIS — Z882 Allergy status to sulfonamides status: Secondary | ICD-10-CM | POA: Diagnosis not present

## 2015-12-09 DIAGNOSIS — Z8719 Personal history of other diseases of the digestive system: Secondary | ICD-10-CM

## 2015-12-09 DIAGNOSIS — F419 Anxiety disorder, unspecified: Secondary | ICD-10-CM | POA: Insufficient documentation

## 2015-12-09 DIAGNOSIS — M170 Bilateral primary osteoarthritis of knee: Secondary | ICD-10-CM | POA: Diagnosis not present

## 2015-12-09 DIAGNOSIS — D122 Benign neoplasm of ascending colon: Secondary | ICD-10-CM | POA: Insufficient documentation

## 2015-12-09 DIAGNOSIS — Z79899 Other long term (current) drug therapy: Secondary | ICD-10-CM | POA: Insufficient documentation

## 2015-12-09 DIAGNOSIS — Z833 Family history of diabetes mellitus: Secondary | ICD-10-CM | POA: Diagnosis not present

## 2015-12-09 DIAGNOSIS — R195 Other fecal abnormalities: Secondary | ICD-10-CM

## 2015-12-09 DIAGNOSIS — Z853 Personal history of malignant neoplasm of breast: Secondary | ICD-10-CM | POA: Insufficient documentation

## 2015-12-09 DIAGNOSIS — K921 Melena: Secondary | ICD-10-CM

## 2015-12-09 DIAGNOSIS — D120 Benign neoplasm of cecum: Secondary | ICD-10-CM | POA: Insufficient documentation

## 2015-12-09 HISTORY — PX: POLYPECTOMY: SHX5525

## 2015-12-09 HISTORY — PX: COLONOSCOPY: SHX5424

## 2015-12-09 LAB — GLUCOSE, CAPILLARY: GLUCOSE-CAPILLARY: 99 mg/dL (ref 65–99)

## 2015-12-09 SURGERY — COLONOSCOPY
Anesthesia: Moderate Sedation

## 2015-12-09 MED ORDER — MEPERIDINE HCL 50 MG/ML IJ SOLN
INTRAMUSCULAR | Status: DC | PRN
  Administered 2015-12-09 (×2): 25 mg via INTRAVENOUS

## 2015-12-09 MED ORDER — MIDAZOLAM HCL 5 MG/5ML IJ SOLN
INTRAMUSCULAR | Status: AC
  Filled 2015-12-09: qty 5

## 2015-12-09 MED ORDER — MEPERIDINE HCL 50 MG/ML IJ SOLN
INTRAMUSCULAR | Status: AC
  Filled 2015-12-09: qty 1

## 2015-12-09 MED ORDER — SODIUM CHLORIDE 0.9 % IV SOLN
INTRAVENOUS | Status: DC
  Administered 2015-12-09: 12:00:00 via INTRAVENOUS

## 2015-12-09 MED ORDER — PSYLLIUM 28 % PO PACK: 1.0000 | PACK | Freq: Every day | ORAL | Status: DC

## 2015-12-09 MED ORDER — MIDAZOLAM HCL 5 MG/5ML IJ SOLN
INTRAMUSCULAR | Status: AC
  Filled 2015-12-09: qty 10

## 2015-12-09 MED ORDER — STERILE WATER FOR IRRIGATION IR SOLN
Status: DC | PRN
  Administered 2015-12-09: 13:00:00

## 2015-12-09 MED ORDER — MIDAZOLAM HCL 5 MG/5ML IJ SOLN
INTRAMUSCULAR | Status: DC | PRN
  Administered 2015-12-09 (×3): 2 mg via INTRAVENOUS
  Administered 2015-12-09: 1 mg via INTRAVENOUS
  Administered 2015-12-09: 3 mg via INTRAVENOUS
  Administered 2015-12-09: 2 mg via INTRAVENOUS

## 2015-12-09 MED ORDER — DOCUSATE SODIUM 100 MG PO CAPS: 100.0000 mg | ORAL_CAPSULE | Freq: Two times a day (BID) | ORAL | 0 refills | Status: DC

## 2015-12-09 NOTE — Discharge Instructions (Signed)
No aspirin or NSAIDs for 10 days. Resume usual medications and high fiber diet. Colace 100 mg by mouth twice daily. Metamucil 4 g or 1 heaping tablespoon full by mouth daily at bedtime. No driving for 24 hours. Physician will call with biopsy results.   Colonoscopy, Adult, Care After This sheet gives you information about how to care for yourself after your procedure. Your doctor may also give you more specific instructions. If you have problems or questions, call your doctor. Follow these instructions at home: General instructions  For the first 24 hours after the procedure:  Do not drive or use machinery.  Do not sign important documents.  Do not drink alcohol.  Do your daily activities more slowly than normal.  Eat foods that are soft and easy to digest.  Rest often.  Take over-the-counter or prescription medicines only as told by your doctor.  It is up to you to get the results of your procedure. Ask your doctor, or the department performing the procedure, when your results will be ready. To help cramping and bloating:  Try walking around.  Put heat on your belly (abdomen) as told by your doctor. Use a heat source that your doctor recommends, such as a moist heat pack or a heating pad.  Put a towel between your skin and the heat source.  Leave the heat on for 20-30 minutes.  Remove the heat if your skin turns bright red. This is especially important if you cannot feel pain, heat, or cold. You can get burned. Eating and drinking  Drink enough fluid to keep your pee (urine) clear or pale yellow.  Return to your normal diet as told by your doctor. Avoid heavy or fried foods that are hard to digest.  Avoid drinking alcohol for as long as told by your doctor. Contact a doctor if:  You have blood in your poop (stool) 2-3 days after the procedure. Get help right away if:  You have more than a small amount of blood in your poop.  You see large clumps of tissue  (blood clots) in your poop.  Your belly is swollen.  You feel sick to your stomach (nauseous).  You throw up (vomit).  You have a fever.  You have belly pain that gets worse, and medicine does not help your pain. This information is not intended to replace advice given to you by your health care provider. Make sure you discuss any questions you have with your health care provider. Document Released: 01/22/2010 Document Revised: 09/14/2015 Document Reviewed: 09/14/2015 Elsevier Interactive Patient Education  2017 [employer].    Diverticulosis Diverticulosis is the condition that develops when small pouches (diverticula) form in the wall of your colon. Your colon, or large intestine, is where water is absorbed and stool is formed. The pouches form when the inside layer of your colon pushes through weak spots in the outer layers of your colon. CAUSES  No one knows exactly what causes diverticulosis. RISK FACTORS  Being older than 47. Your risk for this condition increases with age. Diverticulosis is rare in people younger than 40 years. By age 60, almost everyone has it.  Eating a low-fiber diet.  Being frequently constipated.  Being overweight.  Not getting enough exercise.  Smoking.  Taking over-the-counter pain medicines, like aspirin and ibuprofen. SYMPTOMS  Most people with diverticulosis do not have symptoms. DIAGNOSIS  Because diverticulosis often has no symptoms, health care providers often discover the condition during an exam for other colon problems.  In many cases, a health care provider will diagnose diverticulosis while using a flexible scope to examine the colon (colonoscopy). TREATMENT  If you have never developed an infection related to diverticulosis, you may not need treatment. If you have had an infection before, treatment may include:  Eating more fruits, vegetables, and grains.  Taking a fiber supplement.  Taking a live bacteria supplement  (probiotic).  Taking medicine to relax your colon. HOME CARE INSTRUCTIONS   Drink at least 6-8 glasses of water each day to prevent constipation.  Try not to strain when you have a bowel movement.  Keep all follow-up appointments. If you have had an infection before:  Increase the fiber in your diet as directed by your health care provider or dietitian.  Take a dietary fiber supplement if your health care provider approves.  Only take medicines as directed by your health care provider. SEEK MEDICAL CARE IF:   You have abdominal pain.  You have bloating.  You have cramps.  You have not gone to the bathroom in 3 days. SEEK IMMEDIATE MEDICAL CARE IF:   Your pain gets worse.  Yourbloating becomes very bad.  You have a fever or chills, and your symptoms suddenly get worse.  You begin vomiting.  You have bowel movements that are bloody or black. MAKE SURE YOU:  Understand these instructions.  Will watch your condition.  Will get help right away if you are not doing well or get worse. This information is not intended to replace advice given to you by your health care provider. Make sure you discuss any questions you have with your health care provider. Document Released: 09/17/2003 Document Revised: 12/25/2012 Document Reviewed: 11/14/2012 Elsevier Interactive Patient Education  2017 [place].    Colon Polyps Introduction Polyps are tissue growths inside the body. Polyps can grow in many places, including the large intestine (colon). A polyp may be a round bump or a mushroom-shaped growth. You could have one polyp or several. Most colon polyps are noncancerous (benign). However, some colon polyps can become cancerous over time. What are the causes? The exact cause of colon polyps is not known. What increases the risk? This condition is more likely to develop in people who:  Have a family history of colon cancer or colon polyps.  Are older than 25 or older  than 45 if they are African American.  Have inflammatory bowel disease, such as ulcerative colitis or Crohn disease.  Are overweight.  Smoke cigarettes.  Do not get enough exercise.  Drink too much alcohol.  Eat a diet that is:  High in fat and red meat.  Low in fiber.  Had childhood cancer that was treated with abdominal radiation. What are the signs or symptoms? Most polyps do not cause symptoms. If you have symptoms, they may include:  Blood coming from your rectum when having a bowel movement.  Blood in your stool.The stool may look dark red or black.  A change in bowel habits, such as constipation or diarrhea. How is this diagnosed? This condition is diagnosed with a colonoscopy. This is a procedure that uses a lighted, flexible scope to look at the inside of your colon. How is this treated? Treatment for this condition involves removing any polyps that are found. Those polyps will then be tested for cancer. If cancer is found, your health care provider will talk to you about options for colon cancer treatment. Follow these instructions at home: Diet  Eat plenty of fiber, such as  fruits, vegetables, and whole grains.  Eat foods that are high in calcium and vitamin D, such as milk, cheese, yogurt, eggs, liver, fish, and broccoli.  Limit foods high in fat, red meats, and processed meats, such as hot dogs, sausage, bacon, and lunch meats.  Maintain a healthy weight, or lose weight if recommended by your health care provider. General instructions  Do not smoke cigarettes.  Do not drink alcohol excessively.  Keep all follow-up visits as told by your health care provider. This is important. This includes keeping regularly scheduled colonoscopies. Talk to your health care provider about when you need a colonoscopy.  Exercise every day or as told by your health care provider. Contact a health care provider if:  You have new or worsening bleeding during a bowel  movement.  You have new or increased blood in your stool.  You have a change in bowel habits.  You unexpectedly lose weight. This information is not intended to replace advice given to you by your health care provider. Make sure you discuss any questions you have with your health care provider. Document Released: 09/16/2003 Document Revised: 05/28/2015 Document Reviewed: 11/10/2014  2017 Elsevier

## 2015-12-09 NOTE — Op Note (Signed)
[facility] Patient Name: Lindsay Pacheco Procedure Date: 12/09/2015 12:41 PM MRN: [id number] Date of Birth: July 15, 1960 Attending MD: Hildred Laser , MD CSN: [id number] Age: 55 Admit Type: Outpatient Procedure:                Colonoscopy Indications:              Hematochezia, Follow-up of diverticulitis Providers:                Hildred Laser, MD, Otis Peak B. Sharon Seller, RN, Isabella Stalling, Technician Referring MD:             Monico Blitz, MD Medicines:                Meperidine 50 mg IV, Midazolam 12 mg IV Complications:            No immediate complications. Estimated Blood Loss:     Estimated blood loss was minimal. Procedure:                Pre-Anesthesia Assessment:                           - Prior to the procedure, a History and Physical                            was performed, and patient medications and                            allergies were reviewed. The patient's tolerance of                            previous anesthesia was also reviewed. The risks                            and benefits of the procedure and the sedation                            options and risks were discussed with the patient.                            All questions were answered, and informed consent                            was obtained. After reviewing the risks and                            benefits, the patient was deemed in satisfactory                            condition to undergo the procedure.                           - Prior to the procedure, a History and Physical  was performed, and patient medications and                            allergies were reviewed. The patient's tolerance of                            previous anesthesia was also reviewed. The risks                            and benefits of the procedure and the sedation                            options and risks were discussed with the patient.                             All questions were answered, and informed consent                            was obtained. Prior Anticoagulants: The patient has                            taken no previous anticoagulant or antiplatelet                            agents. ASA Grade Assessment: III - A patient with                            severe systemic disease. After reviewing the risks                            and benefits, the patient was deemed in                            satisfactory condition to undergo the procedure.                           After obtaining informed consent, the colonoscope                            was passed under direct vision. Throughout the                            procedure, the patient's blood pressure, pulse, and                            oxygen saturations were monitored continuously. The                            [id number]) scope was introduced through                            the anus and advanced to the the cecum, identified  by appendiceal orifice and ileocecal valve. The                            colonoscopy was performed without difficulty. The                            patient tolerated the procedure well. The quality                            of the bowel preparation was adequate. The                            ileocecal valve, appendiceal orifice, and rectum                            were photographed. Scope In: 12:57:37 PM Scope Out: 1:35:59 PM Scope Withdrawal Time: 0 hours 32 minutes 17 seconds  Total Procedure Duration: 0 hours 38 minutes 22 seconds  Findings:      A small polyp was found in the cecum. The polyp was sessile. Biopsies       were taken with a cold forceps for histology. The pathology specimen was       placed into Bottle Number 1.      Five sessile polyps were found in the ascending colon. The polyps were 5       mm in size. These polyps were removed with a cold snare. Resection and       retrieval  were complete. The pathology specimen was placed into Bottle       Number 1.      Scattered medium-mouthed diverticula were found in the sigmoid colon.      A 30 mm polyp was found in the rectum. The polyp was semi-pedunculated.       The polyp was removed with a hot snare. Resection and retrieval were       complete. The pathology specimen was placed into Bottle Number 2.      External hemorrhoids were found during retroflexion. The hemorrhoids       were small. Impression:               - One small polyp in the cecum. Biopsied.                           - Five 5 mm polyps in the ascending colon, removed                            with a cold snare. Resected and retrieved.                           - Diverticulosis in the sigmoid colon.                           - One 30 mm polyp in the rectum, removed with a hot                            snare. Resected and retrieved.                           -  External hemorrhoids. Moderate Sedation:      Moderate (conscious) sedation was administered by the endoscopy nurse       and supervised by the endoscopist. The following parameters were       monitored: oxygen saturation, heart rate, blood pressure, CO2       capnography and response to care. Total physician intraservice time was       45 minutes. Recommendation:           - Patient has a contact number available for                            emergencies. The signs and symptoms of potential                            delayed complications were discussed with the                            patient. Return to normal activities tomorrow.                            Written discharge instructions were provided to the                            patient.                           - High fiber diet today.                           - Continue present medications.                           - No aspirin, ibuprofen, naproxen, or other                            non-steroidal anti-inflammatory drugs for  10 days                            after polyp removal.                           - Await pathology results.                           - Repeat colonoscopy for surveillance based on                            pathology results. Procedure Code(s):        --- Professional ---                           [phone], Colonoscopy, flexible; with removal of                            tumor(s), polyp(s), or other lesion(s) by snare  technique                           [id number], 59, Colonoscopy, flexible; with biopsy,                            single or multiple                           99152, Moderate sedation services provided by the                            same physician or other qualified health care                            professional performing the diagnostic or                            therapeutic service that the sedation supports,                            requiring the presence of an independent trained                            observer to assist in the monitoring of the                            patient's level of consciousness and physiological                            status; initial 15 minutes of intraservice time,                            patient age 38 years or older                           [phone], Moderate sedation services; each additional                            15 minutes intraservice time                           99153, Moderate sedation services; each additional                            15 minutes intraservice time Diagnosis Code(s):        --- Professional ---                           D12.0, Benign neoplasm of cecum                           K62.1, Rectal polyp                           D12.2, Benign neoplasm of ascending colon  K64.4, Residual hemorrhoidal skin tags                           K92.1, Melena (includes Hematochezia)                           K57.32, Diverticulitis of large intestine without                             perforation or abscess without bleeding                           K57.30, Diverticulosis of large intestine without                            perforation or abscess without bleeding CPT copyright 2016 American Medical Association. All rights reserved. The codes documented in this report are preliminary and upon coder review may  be revised to meet current compliance requirements. Hildred Laser, MD Hildred Laser, MD 12/09/2015 1:53:22 PM This report has been signed electronically. Number of Addenda: 0

## 2015-12-09 NOTE — H&P (Signed)
Lindsay Pacheco is an 55 y.o. female.   Chief Complaint:  Patient is here for colonoscopy. HPI: Patient is 55 year old Caucasian female who was treated for diverticulitis about 4 months ago. She also noted rectal bleeding at that time. Since then she's been diagnosed with breast carcinoma and had surgery and therefore colonoscopy was delayed. She is getting major proceed with radiation therapy. She is presently not having any abdominal pain diarrhea constipation or rectal bleeding. Family history is negative for CRC.  Past Medical History:  Diagnosis Date  . Anxiety   . Arthritis    "knees" (11/02/2015)  . Breast cancer, left breast ([place]) 09/30/2015  . Depression   . Diabetes mellitus, type II ([place])   . Diverticulitis 1987  . Hypertension   . Irritable bowel syndrome (IBS)   . Sleep apnea    does not use CPAP but "I'm suppose to" (11/02/2015)    Past Surgical History:  Procedure Laterality Date  . BREAST BIOPSY Left 09/2015  . BREAST LUMPECTOMY WITH AXILLARY LYMPH NODE BIOPSY Left 11/02/2015   BREAST LUMPECTOMY WITH BRACKETED RADIOACTIVE SEED AND LEFT AXILLARY SENTINEL LYMPH NODE BIOPSY   . BREAST LUMPECTOMY WITH RADIOACTIVE SEED AND SENTINEL LYMPH NODE BIOPSY Left 11/02/2015   Procedure: LEFT BREAST LUMPECTOMY WITH BRACKETED RADIOACTIVE SEED AND LEFT AXILLARY SENTINEL LYMPH NODE BIOPSY;  Surgeon: [name], MD;  Location: [facility];  Service: General;  Laterality: Left;  . EVACUATION BREAST HEMATOMA Left 11/02/2015  . EVACUATION BREAST HEMATOMA Left 11/02/2015   Procedure: EVACUATION HEMATOMA BREAST;  Surgeon: [name], MD;  Location: [facility];  Service: General;  Laterality: Left;  . JOINT REPLACEMENT    . KNEE ARTHROSCOPY Right 2000s  . LAPAROSCOPIC CHOLECYSTECTOMY  1995  . TOTAL KNEE ARTHROPLASTY Right 2011    Family History  Problem Relation Age of Onset  . Anxiety disorder Father   . Depression Father   . Other Father    Abestosis  . Depression Sister   . Diabetes Mother    Social History:  reports that she has never smoked. She has never used smokeless tobacco. She reports that she drinks alcohol. She reports that she uses drugs, including Marijuana.  Allergies:  Allergies  Allergen Reactions  . Adhesive [Tape] Rash  . Sulfa Antibiotics Nausea And Vomiting    Medications Prior to Admission  Medication Sig Dispense Refill  . acetaminophen (TYLENOL) 500 MG tablet Take 1,000 mg by mouth every 6 (six) hours as needed for moderate pain or headache.    Marland Kitchen atorvastatin (LIPITOR) 10 MG tablet Take 10 mg by mouth daily.    . DULoxetine (CYMBALTA) 60 MG capsule Take 1 capsule (60 mg total) by mouth daily. 30 capsule 0  . lamoTRIgine (LAMICTAL) 25 MG tablet Take 25 mg by mouth daily.     Marland Kitchen lisinopril-hydrochlorothiazide (PRINZIDE,ZESTORETIC) 20-12.5 MG tablet Take 1 tablet by mouth daily.  6  . LORazepam (ATIVAN) 0.5 MG tablet Take 0.5 mg by mouth daily as needed for anxiety.    . metFORMIN (GLUCOPHAGE) 500 MG tablet Take 500 mg by mouth 2 (two) times daily with a meal.       No results found for this or any previous visit (from the past 48 hour(s)). No results found.  ROS  Blood pressure 114/80, pulse 84, temperature 97.5 F (36.4 C), temperature source Oral, resp. rate 19, SpO2 100 %. Physical Exam  Constitutional:  WD obese Caucasian female in NAD.  HENT:  Mouth/Throat: Oropharynx is clear  and moist.  Eyes: Conjunctivae are normal. No scleral icterus.  Neck: No thyromegaly present.  Cardiovascular: Normal rate, regular rhythm and normal heart sounds.   No murmur heard. Respiratory: Effort normal and breath sounds normal.  GI:  Abdomen is full but soft and nontender without organomegaly or masses.  Lymphadenopathy:    She has no cervical adenopathy.     Assessment/Plan History of diverticulitis and rectal bleeding. Diagnostic colonoscopy  Hildred Laser, MD 12/09/2015, 12:45 PM

## 2015-12-11 ENCOUNTER — Ambulatory Visit: Payer: Medicare Other | Admitting: Physical Therapy

## 2015-12-11 ENCOUNTER — Ambulatory Visit
Admission: RE | Admit: 2015-12-11 | Discharge: 2015-12-11 | Disposition: A | Discharge status: Home / Self Care | Payer: Medicare Other | Source: Ambulatory Visit | Attending: Radiation Oncology | Admitting: Radiation Oncology

## 2015-12-11 DIAGNOSIS — Z510 Encounter for antineoplastic radiation therapy: Secondary | ICD-10-CM | POA: Diagnosis not present

## 2015-12-11 DIAGNOSIS — C50212 Malignant neoplasm of upper-inner quadrant of left female breast: Secondary | ICD-10-CM

## 2015-12-11 NOTE — Progress Notes (Signed)
  Radiation Oncology         (336) [phone] ________________________________  Name: NEERA DOGAN MRN: [id number]  Date: 12/11/2015  DOB: January 02, 1961  DIAGNOSIS:     ICD-9-CM ICD-10-CM   1. Malignant neoplasm of upper-inner quadrant of left female breast, unspecified estrogen receptor status ([place]) 174.2 C50.212      SIMULATION AND TREATMENT PLANNING NOTE  The patient presented for simulation prior to beginning her course of radiation treatment for her diagnosis of left-sided breast cancer. The patient was placed in a supine position on a breast board. A customized vac-lock bag was constructed and this complex treatment device will be used on a daily basis during her treatment. In this fashion, a CT scan was obtained through the chest area and an isocenter was placed near the chest wall within the breast.  The patient will be planned to receive a course of radiation initially to a dose of 50.4 Gy. This will consist of a whole breast radiotherapy technique. To accomplish this, 2 customized blocks have been designed which will correspond to medial and lateral whole breast tangent fields. This treatment will be accomplished at 1.8 Gy per fraction. A forward planning technique will also be evaluated to determine if this approach improves the plan. It is anticipated that the patient will then receive a 10 Gy boost to the seroma cavity which has been contoured. This will be accomplished at 2 Gy per fraction.   This initial treatment will consist of a 3-D conformal technique. The seroma has been contoured as the primary target structure. Additionally, dose volume histograms of both this target as well as the lungs and heart will also be evaluated. Such an approach is necessary to ensure that the target area is adequately covered while the nearby critical  normal structures are adequately spared.  Plan:  The final anticipated total dose therefore will correspond to 60.4  Gy.    _______________________________   [name], MD, PhD

## 2015-12-11 NOTE — Progress Notes (Signed)
  Radiation Oncology         (336) [phone] ________________________________  Name: Lindsay Pacheco MRN: [id number]  Date: 12/11/2015  DOB: [birth year]  Optical Surface Tracking Plan:  Since intensity modulated radiotherapy (IMRT) and 3D conformal radiation treatment methods are predicated on accurate and precise positioning for treatment, intrafraction motion monitoring is medically necessary to ensure accurate and safe treatment delivery.  The ability to quantify intrafraction motion without excessive ionizing radiation dose can only be performed with optical surface tracking. Accordingly, surface imaging offers the opportunity to obtain 3D measurements of patient position throughout IMRT and 3D treatments without excessive radiation exposure.  I am ordering optical surface tracking for this patient's upcoming course of radiotherapy. ________________________________  Kyung Rudd, MD 12/11/2015 8:23 PM    Reference:   Particia Jasper, et al. Surface imaging-based analysis of intrafraction motion for breast radiotherapy patients.Journal of [employer], n. 6, nov. 2014. ISSN [id number].   Available at: <http://www.jacmp.org/index.php/jacmp/article/view/4957>.

## 2015-12-14 ENCOUNTER — Encounter: Payer: Self-pay | Admitting: Physical Therapy

## 2015-12-14 ENCOUNTER — Ambulatory Visit: Payer: Medicare Other | Admitting: Physical Therapy

## 2015-12-14 DIAGNOSIS — M25612 Stiffness of left shoulder, not elsewhere classified: Secondary | ICD-10-CM | POA: Diagnosis not present

## 2015-12-14 DIAGNOSIS — R600 Localized edema: Secondary | ICD-10-CM

## 2015-12-14 NOTE — Patient Instructions (Signed)

## 2015-12-14 NOTE — Therapy (Signed)
[place], [place], [address] Phone: [phone]   Fax:  [phone]  Physical Therapy Treatment  Patient Details  Name: Lindsay Pacheco MRN: [id number] Date of Birth: 26-May-1960 Referring Provider: Donne Hazel  Encounter Date: 12/14/2015      PT End of Session - 12/14/15 1713    Visit Number 4   Number of Visits 9   Date for PT Re-Evaluation 12/22/15   PT Start Time 1300   PT Stop Time 1346   PT Time Calculation (min) 46 min   Activity Tolerance Patient tolerated treatment well   Behavior During Therapy [facility] for tasks assessed/performed      Past Medical History:  Diagnosis Date  . Anxiety   . Arthritis    "knees" (11/02/2015)  . Breast cancer, left breast ([place]) 09/30/2015  . Depression   . Diabetes mellitus, type II ([place])   . Diverticulitis 1987  . Hypertension   . Irritable bowel syndrome (IBS)   . Sleep apnea    does not use CPAP but "I'm suppose to" (11/02/2015)    Past Surgical History:  Procedure Laterality Date  . BREAST BIOPSY Left 09/2015  . BREAST LUMPECTOMY WITH AXILLARY LYMPH NODE BIOPSY Left 11/02/2015   BREAST LUMPECTOMY WITH BRACKETED RADIOACTIVE SEED AND LEFT AXILLARY SENTINEL LYMPH NODE BIOPSY   . BREAST LUMPECTOMY WITH RADIOACTIVE SEED AND SENTINEL LYMPH NODE BIOPSY Left 11/02/2015   Procedure: LEFT BREAST LUMPECTOMY WITH BRACKETED RADIOACTIVE SEED AND LEFT AXILLARY SENTINEL LYMPH NODE BIOPSY;  Surgeon: [name], MD;  Location: [place];  Service: General;  Laterality: Left;  . EVACUATION BREAST HEMATOMA Left 11/02/2015  . EVACUATION BREAST HEMATOMA Left 11/02/2015   Procedure: EVACUATION HEMATOMA BREAST;  Surgeon: [name], MD;  Location: [place];  Service: General;  Laterality: Left;  . JOINT REPLACEMENT    . KNEE ARTHROSCOPY Right 2000s  . LAPAROSCOPIC CHOLECYSTECTOMY  1995  . TOTAL KNEE ARTHROPLASTY Right 2011     There were no vitals filed for this visit.      Subjective Assessment - 12/14/15 1300    Subjective I feel like the numbness is better. My breast is still larger than the other side.  Pt states last session really helped and her cording is improving.    Pertinent History breast cancer left upper quadrant, er/pr positive, left lumpectomy 11/02/15, pt will see oncologist tomorrow to determine if she will need chemo, she will have to have radiation in the future, R TKA   Patient Stated Goals to get back to normal   Currently in Pain? No/denies   Pain Score 0-No pain                         [facility] Adult PT Treatment/Exercise - 12/14/15 0001      Manual Therapy   Manual Therapy Edema management   Edema Management cut a piece of Komprex foam and placed in thick stockinette for pt to wear in the side of her bra to control edema   Manual Lymphatic Drainage (MLD) In Supine: Short neck, 5 diaphragmatic breaths, Rt axilla and Lt inguinal nodes, Lt axillo-inguinal and anterior inter-axillary anastomosis, then Lt UE from dorsal hand to lateral shoulder working proximal to distal then retracing all steps.   Passive ROM In Supine to Lt shoulder into flexion, abduction to pt's tolerance  [facility] Goals - 12/14/15 1301      CC Long Term Goal  #1   Title Pt will demonstrate 165 degrees of left shoulder abduction to allow her to reach items out to sides   Baseline 118; P/ROM with stretching approximately 130 degrees-12/03/15, 12/14/15- 165 degrees   Time 4   Period Weeks   Status Achieved     CC Long Term Goal  #2   Title Pt will be able to independently verbalize lymphedema risk reduction strategies   Baseline 12/14/15- pt required mod cues   Time 4   Period Weeks   Status On-going     CC Long Term Goal  #3   Title Pt will be independent in a home exercise program for continued strengthening and stretching   Time 4   Period  Weeks   Status On-going     CC Long Term Goal  #4   Title Pt will be independent in self drainage technique for management of localized swelling in left trunk   Baseline 12/14/15- instructed pt this visit   Time 4   Period Weeks   Status On-going     CC Long Term Goal  #5   Title Pt will report a 75% improvement in swelling in left trunk for improved comfort   Baseline Pt reported this did feel a little better today but did not quantify-12/03/15, 12/14/15- pt reports at least 50%   Time 4   Period Weeks   Status On-going            Plan - 12/14/15 1714    Clinical Impression Statement Patient was educated today in scar tissue massage, masssage for cording and self manual lymphatic drainage. Patient was given handout for performing self MLD and was able to demonstrate correct technique with therapist instruction. She had relief from cording after last session. Cut a piece of Komprex foam and placed in stockinette for pt to wear in her bra on the side to help control edema.    Rehab Potential Excellent   Clinical Impairments Affecting Rehab Potential upcoming radiation   PT Frequency 2x / week   PT Duration 4 weeks   PT Treatment/Interventions ADLs/Self Care Home Management;DME Instruction;Therapeutic exercise;Orthotic Fit/Training;Patient/family education;Manual lymph drainage;Manual techniques;Scar mobilization;Passive range of motion;Taping   PT Next Visit Plan Cont MLD to left trunk and UE, ask is Komprex helped in bra, assess for indep with self drainage,  myofascial to axillary scar, and AAROM/AROM/PROM to Lt shoulder. Progress HEP prn.    PT Home Exercise Plan supine dowel shoulder abduction   Consulted and Agree with Plan of Care Patient      Patient will benefit from skilled therapeutic intervention in order to improve the following deficits and impairments:  Decreased knowledge of precautions, Increased edema, Impaired UE functional use, Decreased strength, Decreased range  of motion, Decreased scar mobility  Visit Diagnosis: Stiffness of left shoulder, not elsewhere classified  Localized edema     Problem List Patient Active Problem List   Diagnosis Date Noted  . Cancer of left female breast  ([place]) 11/02/2015  . Moderate episode of recurrent major depressive disorder ([place]) 10/22/2015  . Malignant neoplasm of upper inner quadrant of female breast ([place]) 10/14/2015  . Diabetes ([place]) 09/24/2015  . Hypertension     Alexia Freestone 12/14/2015, 5:19 PM  [place], [place], [address] Phone: [phone]   Fax:  [phone]  Name: Lindsay Pacheco MRN:  [id number] Date of Birth: [birth year]   Allyson Sabal, PT 12/14/15 5:19 PM

## 2015-12-15 DIAGNOSIS — Z510 Encounter for antineoplastic radiation therapy: Secondary | ICD-10-CM | POA: Diagnosis not present

## 2015-12-16 ENCOUNTER — Ambulatory Visit: Payer: Medicare Other

## 2015-12-17 ENCOUNTER — Encounter (HOSPITAL_COMMUNITY): Payer: Self-pay | Admitting: Internal Medicine

## 2015-12-17 NOTE — Progress Notes (Signed)
[facility] MD/PA/NP OP Progress Note  12/22/2015 3:32 PM SOMER DUKE  MRN:  [id number]  Chief Complaint:  Chief Complaint    Follow-up; Depression     Subjective:  "I have anger issues" HPI:  Patient states that she has not started Depakote, as she was concerned about potential side effect of liver damage. She had radiation therapy this morning and is planning to get it until February. She feels stressed about family issues which includes her brother who comes out of prison after 28 years. She has anger issues and tend to snaps at people. She denies feeling depressed. She denies SI. She denies AH/VH. She reports the last marijuana use was a few months ago.   Visit Diagnosis:    ICD-9-CM ICD-10-CM   1. Moderate episode of recurrent major depressive disorder (HCC) 296.32 F33.1 DULoxetine (CYMBALTA) 60 MG capsule    Past Psychiatric History:  Outpatient: used to be seen at [facility] for counseling Psychiatry admission: [facility] in 2012 for depression, SI of overdosing Ativan, IOP Previous suicide attempt: SI of overdosing Ativan in 2012, no SIB Past trials of medication: Paxil (felt numb), Effexor, Abilify, Klonopin,    Past Medical History:  Past Medical History:  Diagnosis Date  . Anxiety   . Arthritis    "knees" (11/02/2015)  . Breast cancer, left breast ([place]) 09/30/2015  . Depression   . Diabetes mellitus, type II ([place])   . Diverticulitis 1987  . Hypertension   . Irritable bowel syndrome (IBS)   . Sleep apnea    does not use CPAP but "I'm suppose to" (11/02/2015)    Past Surgical History:  Procedure Laterality Date  . BREAST BIOPSY Left 09/2015  . BREAST LUMPECTOMY WITH AXILLARY LYMPH NODE BIOPSY Left 11/02/2015   BREAST LUMPECTOMY WITH BRACKETED RADIOACTIVE SEED AND LEFT AXILLARY SENTINEL LYMPH NODE BIOPSY   . BREAST LUMPECTOMY WITH RADIOACTIVE SEED AND SENTINEL LYMPH NODE BIOPSY Left 11/02/2015   Procedure: LEFT BREAST LUMPECTOMY WITH BRACKETED RADIOACTIVE SEED AND  LEFT AXILLARY SENTINEL LYMPH NODE BIOPSY;  Surgeon: [name], MD;  Location: [place];  Service: General;  Laterality: Left;  . COLONOSCOPY N/A 12/09/2015   Procedure: COLONOSCOPY;  Surgeon: [name], MD;  [facility];  Service: Endoscopy;  Laterality: N/A;  1:00  . EVACUATION BREAST HEMATOMA Left 11/02/2015  . EVACUATION BREAST HEMATOMA Left 11/02/2015   Procedure: EVACUATION HEMATOMA BREAST;  Surgeon: [name], MD;  Location: [place];  Service: General;  Laterality: Left;  . JOINT REPLACEMENT    . KNEE ARTHROSCOPY Right 2000s  . LAPAROSCOPIC CHOLECYSTECTOMY  1995  . POLYPECTOMY  12/09/2015   Procedure: POLYPECTOMY;  Surgeon: [name], MD;  [facility];  Service: Endoscopy;;  multiple  . TOTAL KNEE ARTHROPLASTY Right 2011    Family Psychiatric History:  father- anxiety, depression, paternal grandmother- depression,  sister- depression, anxiety  Family History:  Family History  Problem Relation Age of Onset  . Anxiety disorder Father   . Depression Father   . Other Father     Abestosis  . Depression Sister   . Diabetes Mother     Social History:  Social History   Social History  . Marital status: Divorced    Spouse name: N/A  . Number of children: N/A  . Years of education: N/A   Social History Main Topics  . Smoking status: Never Smoker  . Smokeless tobacco: Never Used  . Alcohol use Yes  Comment: 11/02/2015 "maybe 4 drinks/year"  . Drug use:     Types: Marijuana     Comment: 11/02/2015 "drug use in my past; now maybe 2 puffs 3-4 times/year; for social anxiety"  . Sexual activity: Not Currently   Other Topics Concern  . None   Social History Narrative  . None   On disability for chronic pain secondary to arthritis, depression, anxiety She used to work at [employer] until 2011 when she was admitted Education: graduated from [employer], college, no degrees,   Divorced in 1997, has one daughter She lives with her mother  Allergies:  Allergies  Allergen Reactions  . Adhesive [Tape] Rash  . Sulfa Antibiotics Nausea And Vomiting    Metabolic Disorder Labs: No results found for: HGBA1C, MPG No results found for: PROLACTIN No results found for: CHOL, TRIG, HDL, CHOLHDL, VLDL, LDLCALC   Current Medications: Current Outpatient Prescriptions  Medication Sig Dispense Refill  . acetaminophen (TYLENOL) 500 MG tablet Take 1,000 mg by mouth every 6 (six) hours as needed for moderate pain or headache.    Marland Kitchen atorvastatin (LIPITOR) 10 MG tablet Take 10 mg by mouth daily.    Marland Kitchen docusate sodium (COLACE) 100 MG capsule Take 1 capsule (100 mg total) by mouth 2 (two) times daily. 10 capsule 0  . DULoxetine (CYMBALTA) 60 MG capsule Take 1 capsule (60 mg total) by mouth daily. 30 capsule 1  . hyaluronate sodium (RADIAPLEXRX) GEL Apply 1 application topically 2 (two) times daily.    Marland Kitchen lisinopril-hydrochlorothiazide (PRINZIDE,ZESTORETIC) 20-12.5 MG tablet Take 1 tablet by mouth daily.  6  . LORazepam (ATIVAN) 0.5 MG tablet Take 0.5 mg by mouth daily as needed for anxiety.    . metFORMIN (GLUCOPHAGE) 500 MG tablet Take 500 mg by mouth 2 (two) times daily with a meal.     . non-metallic deodorant (ALRA) MISC Apply 1 application topically daily as needed.    . psyllium (METAMUCIL SMOOTH TEXTURE) 28 % packet Take 1 packet by mouth at bedtime.    . divalproex (DEPAKOTE ER) 500 MG 24 hr tablet Take 1 tablet (500 mg total) by mouth at bedtime. 30 tablet 1   No current facility-administered medications for this visit.     Neurologic: Headache: No Seizure: No Paresthesias: No  Musculoskeletal: Strength & Muscle Tone: within normal limits Gait & Station: normal Patient leans: N/A  Psychiatric Specialty Exam: Review of Systems  Neurological: Positive for tingling.  Psychiatric/Behavioral: Negative for depression, hallucinations, substance abuse and suicidal  ideas. The patient is nervous/anxious and has insomnia.   All other systems reviewed and are negative.   Blood pressure 107/75, pulse 86, height 5\' 7"  (1.702 m), weight 265 lb 9.6 oz (120.5 kg).Body mass index is 41.6 kg/m.  General Appearance: Fairly Groomed  Eye Contact:  Good  Speech:  Clear and Coherent  Volume:  Normal  Mood:  Anxious  Affect:  Appropriate and Restricted  Thought Process:  Coherent and Goal Directed  Orientation:  Full (Time, Place, and Person)  Thought Content: Logical  Perceptions: denies AH/VH  Suicidal Thoughts:  No  Homicidal Thoughts:  No  Memory:  Immediate;   Good Recent;   Good Remote;   Good  Judgement:  Good  Insight:  Fair  Psychomotor Activity:  Normal  Concentration:  Concentration: Good and Attention Span: Good  Recall:  Good  Fund of Knowledge: Good  Language: Good  Akathisia:  No  Handed:  Right  AIMS (if indicated):  N/A  Assets:  Communication Skills Desire for Improvement  ADL's:  Intact  Cognition: WNL  Sleep:  fair   Assessment THULA KURCZEWSKI is a 55 year old female with depression, diabetes, hypertension, left breast cancer, irritable bowel syndrome, who presented to the clinic to establish care.   # MDD # r/o borderline personality disorder Patient continues to have significant irritability and anxiety. It is noted that she does have cluster B traits which are characterized by emotional dysregulation, low self-esteem, and sense of rejection. Will start Depakote to target emotional dysregulation. Discussed risk of tremors, confusion and elevated LFT; patient to bring blood test at the next visit. Will consider checking TSH if it is not done. She will greatly benefit from DBT; patient is encouraged to see Ms. Peggy for therapy.   Plan -  Start Depakote 500 mg at night -  Continue duloxetine 60 mg daily - Patient to send fax for blood test; will plan to check Plt, CMP, TSH if not done  -  Return to clinic in  January  Treatment Plan Summary: Plan as above  The patient demonstrates the following risk factors for suicide: Chronic risk factors for suicide include: psychiatric disorder of depression, substance use disorder, previous suicide attempts overdosing ativan and chronic pain. Acute risk factors for suicide include: family or marital conflict, unemployment, social withdrawal/isolation and loss (financial, interpersonal, professional). Protective factors for this patient include: coping skills and hope for the future. Considering these factors, the overall suicide risk at this point appears to be low. Patient is appropriate for outpatient follow up.  Norman Clay, MD 12/22/2015, 3:32 PM

## 2015-12-21 ENCOUNTER — Encounter: Payer: Self-pay | Admitting: Physical Therapy

## 2015-12-21 ENCOUNTER — Ambulatory Visit
Admission: RE | Admit: 2015-12-21 | Discharge: 2015-12-21 | Disposition: A | Discharge status: Home / Self Care | Payer: Medicare Other | Source: Ambulatory Visit | Attending: Radiation Oncology | Admitting: Radiation Oncology

## 2015-12-21 ENCOUNTER — Ambulatory Visit: Payer: Medicare Other | Admitting: Physical Therapy

## 2015-12-21 DIAGNOSIS — M25612 Stiffness of left shoulder, not elsewhere classified: Secondary | ICD-10-CM

## 2015-12-21 DIAGNOSIS — R600 Localized edema: Secondary | ICD-10-CM

## 2015-12-21 DIAGNOSIS — Z510 Encounter for antineoplastic radiation therapy: Secondary | ICD-10-CM | POA: Diagnosis not present

## 2015-12-21 NOTE — Therapy (Signed)
[place], [place], [address] Phone: [phone]   Fax:  [phone]  Physical Therapy Treatment  Patient Details  Name: Lindsay Pacheco MRN: [id number] Date of Birth: June 03, 1960 Referring Provider: Donne Hazel  Encounter Date: 12/21/2015      PT End of Session - 12/21/15 1151    Visit Number 5   Number of Visits 9   Date for PT Re-Evaluation 12/22/15   PT Start Time 1117  pt arrived late   PT Stop Time 1148   PT Time Calculation (min) 31 min   Activity Tolerance Patient tolerated treatment well   Behavior During Therapy [facility] for tasks assessed/performed      Past Medical History:  Diagnosis Date  . Anxiety   . Arthritis    "knees" (11/02/2015)  . Breast cancer, left breast ([place]) 09/30/2015  . Depression   . Diabetes mellitus, type II ([place])   . Diverticulitis 1987  . Hypertension   . Irritable bowel syndrome (IBS)   . Sleep apnea    does not use CPAP but "I'm suppose to" (11/02/2015)    Past Surgical History:  Procedure Laterality Date  . BREAST BIOPSY Left 09/2015  . BREAST LUMPECTOMY WITH AXILLARY LYMPH NODE BIOPSY Left 11/02/2015   BREAST LUMPECTOMY WITH BRACKETED RADIOACTIVE SEED AND LEFT AXILLARY SENTINEL LYMPH NODE BIOPSY   . BREAST LUMPECTOMY WITH RADIOACTIVE SEED AND SENTINEL LYMPH NODE BIOPSY Left 11/02/2015   Procedure: LEFT BREAST LUMPECTOMY WITH BRACKETED RADIOACTIVE SEED AND LEFT AXILLARY SENTINEL LYMPH NODE BIOPSY;  Surgeon: [name], MD;  Location: [place];  Service: General;  Laterality: Left;  . COLONOSCOPY N/A 12/09/2015   Procedure: COLONOSCOPY;  Surgeon: [name], MD;  [facility];  Service: Endoscopy;  Laterality: N/A;  1:00  . EVACUATION BREAST HEMATOMA Left 11/02/2015  . EVACUATION BREAST HEMATOMA Left 11/02/2015   Procedure: EVACUATION HEMATOMA BREAST;  Surgeon: [name], MD;  Location: [place];   Service: General;  Laterality: Left;  . JOINT REPLACEMENT    . KNEE ARTHROSCOPY Right 2000s  . LAPAROSCOPIC CHOLECYSTECTOMY  1995  . POLYPECTOMY  12/09/2015   Procedure: POLYPECTOMY;  Surgeon: [name], MD;  [facility];  Service: Endoscopy;;  multiple  . TOTAL KNEE ARTHROPLASTY Right 2011    There were no vitals filed for this visit.      Subjective Assessment - 12/21/15 1119    Subjective I think the foam is helping. I think that area feels softer. I tried the massage some. I think it is helping some. I can't find the cords. Maybe they are gone. It is not sore to the touch right now.    Pertinent History breast cancer left upper quadrant, er/pr positive, left lumpectomy 11/02/15, pt will see oncologist tomorrow to determine if she will need chemo, she will have to have radiation in the future, R TKA   Patient Stated Goals to get back to normal   Currently in Pain? No/denies   Pain Score 0-No pain                         [facility] Adult PT Treatment/Exercise - 12/21/15 0001      Manual Therapy   Manual Therapy Edema management;Myofascial release   Myofascial Release to areas of cording from mid upper arm to forearm   Manual Lymphatic Drainage (MLD) educated pt in technique throughout: In Supine: Short neck, 5 diaphragmatic  breaths, Rt axilla and Lt inguinal nodes, Lt axillo-inguinal and anterior inter-axillary anastomosis, focused on area of edema in left trunk, right sidelying, establishment of posterior interaxillary pathway, continued work on left trunk, back to supine, retracing all steps.   Passive ROM In Supine to Lt shoulder into flexion, abduction to pt's tolerance                        [facility] Goals - 12/14/15 1301      CC Long Term Goal  #1   Title Pt will demonstrate 165 degrees of left shoulder abduction to allow her to reach items out to sides   Baseline 118; P/ROM with stretching approximately 130  degrees-12/03/15, 12/14/15- 165 degrees   Time 4   Period Weeks   Status Achieved     CC Long Term Goal  #2   Title Pt will be able to independently verbalize lymphedema risk reduction strategies   Baseline 12/14/15- pt required mod cues   Time 4   Period Weeks   Status On-going     CC Long Term Goal  #3   Title Pt will be independent in a home exercise program for continued strengthening and stretching   Time 4   Period Weeks   Status On-going     CC Long Term Goal  #4   Title Pt will be independent in self drainage technique for management of localized swelling in left trunk   Baseline 12/14/15- instructed pt this visit   Time 4   Period Weeks   Status On-going     CC Long Term Goal  #5   Title Pt will report a 75% improvement in swelling in left trunk for improved comfort   Baseline Pt reported this did feel a little better today but did not quantify-12/03/15, 12/14/15- pt reports at least 50%   Time 4   Period Weeks   Status On-going            Plan - 12/21/15 1151    Clinical Impression Statement Patient arrived late to appointment so today was an abbreviated session. Re educated pt in proper self drainage technique. She states she has not been able to practice much at home over the weekend because she was traveling for her daughter's graduation. Her cording is less pronounced but still palpable especially at her forearm. Myofascial release performed to this area today. There also is softening of scar tissue around lumpectomy scar which pt feels the Komprex foam has helped with this.    Rehab Potential Excellent   Clinical Impairments Affecting Rehab Potential upcoming radiation   PT Frequency 2x / week   PT Duration 4 weeks   PT Treatment/Interventions ADLs/Self Care Home Management;DME Instruction;Therapeutic exercise;Orthotic Fit/Training;Patient/family education;Manual lymph drainage;Manual techniques;Scar mobilization;Passive range of motion;Taping   PT Next  Visit Plan Cont MLD to left trunk and UE, assess for indep with self drainage,  myofascial to axillary scar, and AAROM/AROM/PROM to Lt shoulder. Progress HEP prn.    PT Home Exercise Plan supine dowel shoulder abduction   Consulted and Agree with Plan of Care Patient      Patient will benefit from skilled therapeutic intervention in order to improve the following deficits and impairments:  Decreased knowledge of precautions, Increased edema, Impaired UE functional use, Decreased strength, Decreased range of motion, Decreased scar mobility  Visit Diagnosis: Stiffness of left shoulder, not elsewhere classified  Localized edema     Problem List Patient Active  Problem List   Diagnosis Date Noted  . Cancer of left female breast  ([place]) 11/02/2015  . Moderate episode of recurrent major depressive disorder ([place]) 10/22/2015  . Malignant neoplasm of upper inner quadrant of female breast ([place]) 10/14/2015  . Diabetes ([place]) 09/24/2015  . Hypertension     Alexia Freestone 12/21/2015, 11:54 AM  [place], [place], [address] Phone: [phone]   Fax:  [phone]  Name: Lindsay Pacheco MRN: [id number] Date of Birth: November 21, 1960  Allyson Sabal, PT 12/21/15 11:54 AM

## 2015-12-22 ENCOUNTER — Ambulatory Visit (INDEPENDENT_AMBULATORY_CARE_PROVIDER_SITE_OTHER): Payer: Medicare Other | Admitting: Psychiatry

## 2015-12-22 ENCOUNTER — Ambulatory Visit
Admission: RE | Admit: 2015-12-22 | Discharge: 2015-12-22 | Disposition: A | Discharge status: Home / Self Care | Payer: Medicare Other | Source: Ambulatory Visit | Attending: Radiation Oncology | Admitting: Radiation Oncology

## 2015-12-22 ENCOUNTER — Encounter (HOSPITAL_COMMUNITY): Payer: Self-pay | Admitting: Psychiatry

## 2015-12-22 ENCOUNTER — Ambulatory Visit: Payer: Medicare Other

## 2015-12-22 VITALS — Wt 264.4 lb

## 2015-12-22 VITALS — BP 107/75 | HR 86 | Ht 67.0 in | Wt 265.6 lb

## 2015-12-22 DIAGNOSIS — Z888 Allergy status to other drugs, medicaments and biological substances status: Secondary | ICD-10-CM

## 2015-12-22 DIAGNOSIS — Z9889 Other specified postprocedural states: Secondary | ICD-10-CM

## 2015-12-22 DIAGNOSIS — Z818 Family history of other mental and behavioral disorders: Secondary | ICD-10-CM

## 2015-12-22 DIAGNOSIS — Z79899 Other long term (current) drug therapy: Secondary | ICD-10-CM

## 2015-12-22 DIAGNOSIS — C50211 Malignant neoplasm of upper-inner quadrant of right female breast: Secondary | ICD-10-CM

## 2015-12-22 DIAGNOSIS — Z833 Family history of diabetes mellitus: Secondary | ICD-10-CM

## 2015-12-22 DIAGNOSIS — F331 Major depressive disorder, recurrent, moderate: Secondary | ICD-10-CM | POA: Diagnosis not present

## 2015-12-22 DIAGNOSIS — C50212 Malignant neoplasm of upper-inner quadrant of left female breast: Secondary | ICD-10-CM | POA: Insufficient documentation

## 2015-12-22 DIAGNOSIS — Z510 Encounter for antineoplastic radiation therapy: Secondary | ICD-10-CM | POA: Diagnosis not present

## 2015-12-22 DIAGNOSIS — Z882 Allergy status to sulfonamides status: Secondary | ICD-10-CM

## 2015-12-22 MED ORDER — DIVALPROEX SODIUM ER 500 MG PO TB24
500.0000 mg | ORAL_TABLET | Freq: Every day | ORAL | 1 refills | Status: DC
Start: 2015-12-22 — End: 2016-01-26

## 2015-12-22 MED ORDER — DULOXETINE HCL 60 MG PO CPEP: 60.0000 mg | ORAL_CAPSULE | Freq: Every day | ORAL | 1 refills | Status: DC

## 2015-12-22 MED ORDER — RADIAPLEXRX EX GEL
Freq: Once | CUTANEOUS | Status: AC
  Administered 2015-12-22: 13:00:00 via TOPICAL

## 2015-12-22 MED ORDER — ALRA NON-METALLIC DEODORANT (RAD-ONC)
1.0000 "application " | Freq: Once | TOPICAL | Status: AC
  Administered 2015-12-22: 1 via TOPICAL

## 2015-12-22 NOTE — Progress Notes (Signed)
Patient education done, radiation therapy and you book, [facility] flyer, radiaplex gel ,alra deodorant, my business card, discussed ways to manage side effects, skin irritation, fatigue, , dove soap unscented, pat area , no rubbing,scrubbing or scratching, apply radiaplex and alra after radiation and bedtimes daily, use of electric razor only when shaving under axilla, no under wire bras if possible, increase protein and drink plenty water, sees MD and staff RN weekly and prn, gave flyer on [facility]  For support group, she was interested  In joinging when completeing radiation, verbal understanding, teach back given 12:43 PM

## 2015-12-22 NOTE — Patient Instructions (Signed)
-    Start Depakote 500 mg at night -  Continue duloxetine 60 mg daily -  Return to clinic in January

## 2015-12-23 ENCOUNTER — Ambulatory Visit
Admission: RE | Admit: 2015-12-23 | Discharge: 2015-12-23 | Disposition: A | Discharge status: Home / Self Care | Payer: Medicare Other | Source: Ambulatory Visit | Attending: Radiation Oncology | Admitting: Radiation Oncology

## 2015-12-23 DIAGNOSIS — Z510 Encounter for antineoplastic radiation therapy: Secondary | ICD-10-CM | POA: Diagnosis not present

## 2015-12-24 ENCOUNTER — Encounter: Payer: Self-pay | Admitting: Physical Therapy

## 2015-12-24 ENCOUNTER — Ambulatory Visit: Payer: Medicare Other | Admitting: Physical Therapy

## 2015-12-24 ENCOUNTER — Ambulatory Visit
Admission: RE | Admit: 2015-12-24 | Discharge: 2015-12-24 | Disposition: A | Discharge status: Home / Self Care | Payer: Medicare Other | Source: Ambulatory Visit | Attending: Radiation Oncology | Admitting: Radiation Oncology

## 2015-12-24 DIAGNOSIS — M6281 Muscle weakness (generalized): Secondary | ICD-10-CM

## 2015-12-24 DIAGNOSIS — Z510 Encounter for antineoplastic radiation therapy: Secondary | ICD-10-CM | POA: Diagnosis not present

## 2015-12-24 DIAGNOSIS — M25612 Stiffness of left shoulder, not elsewhere classified: Secondary | ICD-10-CM

## 2015-12-24 DIAGNOSIS — R600 Localized edema: Secondary | ICD-10-CM

## 2015-12-24 NOTE — Therapy (Signed)
[place], [place], [address] Phone: [phone]   Fax:  [phone]  Physical Therapy Treatment  Patient Details  Name: Lindsay Pacheco MRN: [id number] Date of Birth: August 02, 1960 Referring Provider: Donne Hazel  Encounter Date: 12/24/2015      PT End of Session - 12/24/15 1219    Visit Number 6   Number of Visits 17   Date for PT Re-Evaluation 01/21/16   PT Start Time [id number]   PT Stop Time 1145   PT Time Calculation (min) 43 min   Activity Tolerance Patient tolerated treatment well   Behavior During Therapy [facility] for tasks assessed/performed      Past Medical History:  Diagnosis Date  . Anxiety   . Arthritis    "knees" (11/02/2015)  . Breast cancer, left breast ([place]) 09/30/2015  . Depression   . Diabetes mellitus, type II ([place])   . Diverticulitis 1987  . Hypertension   . Irritable bowel syndrome (IBS)   . Sleep apnea    does not use CPAP but "I'm suppose to" (11/02/2015)    Past Surgical History:  Procedure Laterality Date  . BREAST BIOPSY Left 09/2015  . BREAST LUMPECTOMY WITH AXILLARY LYMPH NODE BIOPSY Left 11/02/2015   BREAST LUMPECTOMY WITH BRACKETED RADIOACTIVE SEED AND LEFT AXILLARY SENTINEL LYMPH NODE BIOPSY   . BREAST LUMPECTOMY WITH RADIOACTIVE SEED AND SENTINEL LYMPH NODE BIOPSY Left 11/02/2015   Procedure: LEFT BREAST LUMPECTOMY WITH BRACKETED RADIOACTIVE SEED AND LEFT AXILLARY SENTINEL LYMPH NODE BIOPSY;  Surgeon: [name], MD;  Location: [facility];  Service: General;  Laterality: Left;  . COLONOSCOPY N/A 12/09/2015   Procedure: COLONOSCOPY;  Surgeon: [name], MD;  [facility];  Service: Endoscopy;  Laterality: N/A;  1:00  . EVACUATION BREAST HEMATOMA Left 11/02/2015  . EVACUATION BREAST HEMATOMA Left 11/02/2015   Procedure: EVACUATION HEMATOMA BREAST;  Surgeon: [name], MD;  Location: [facility];  Service:  General;  Laterality: Left;  . JOINT REPLACEMENT    . KNEE ARTHROSCOPY Right 2000s  . LAPAROSCOPIC CHOLECYSTECTOMY  1995  . POLYPECTOMY  12/09/2015   Procedure: POLYPECTOMY;  Surgeon: [name], MD;  [facility];  Service: Endoscopy;;  multiple  . TOTAL KNEE ARTHROPLASTY Right 2011    There were no vitals filed for this visit.      Subjective Assessment - 12/24/15 1107    Subjective I have been trying the self massage some. I have radiation now so it's hard to do it as much as I should.    Pertinent History breast cancer left upper quadrant, er/pr positive, left lumpectomy 11/02/15, pt will see oncologist tomorrow to determine if she will need chemo, she will have to have radiation in the future, R TKA   Patient Stated Goals to get back to normal   Currently in Pain? No/denies   Pain Score 0-No pain                         [facility] Adult PT Treatment/Exercise - 12/24/15 0001      Manual Therapy   Manual Therapy Edema management;Myofascial release   Myofascial Release to areas of cording from mid upper arm to forearm   Manual Lymphatic Drainage (MLD) educated pt in technique throughout and had pt demonstrate correct technique: In Supine: Short neck, 5 diaphragmatic breaths, Rt axilla and Lt inguinal nodes, Lt axillo-inguinal and anterior inter-axillary anastomosis, focused on area  of edema in left trunk, right sidelying, establishment of posterior interaxillary pathway, continued work on left trunk, back to supine, retracing all steps.                        [facility] Goals - 12/24/15 1107      [facility] Long Term Goal  #1   Title Pt will demonstrate 165 degrees of left shoulder abduction to allow her to reach items out to sides   Baseline 118; P/ROM with stretching approximately 130 degrees-12/03/15, 12/14/15- 165 degrees   Time 4   Period Weeks   Status Achieved     CC Long Term Goal  #2   Title Pt will be able to independently  verbalize lymphedema risk reduction strategies   Baseline 12/14/15- pt required mod cues, 12/24/15- pt independent with this   Time 4   Period Weeks   Status Achieved     CC Long Term Goal  #3   Title Pt will be independent in a home exercise program for continued strengthening and stretching   Time 4   Period Weeks   Status On-going     CC Long Term Goal  #4   Title Pt will be independent in self drainage technique for management of localized swelling in left trunk   Baseline 12/14/15- instructed pt this visit, 12/24/15- pt required minimal cueing for this today   Time 4   Period Weeks   Status On-going     CC Long Term Goal  #5   Title Pt will report a 75% improvement in swelling in left trunk for improved comfort   Baseline Pt reported this did feel a little better today but did not quantify-12/03/15, 12/14/15- pt reports at least 50%, 12/24/15- at least 50% still   Time 4   Period Weeks   Status On-going            Plan - 12/24/15 1219    Clinical Impression Statement Patient is progressing towrads her goals in therapy. She was able to verbalize lymphedema risk reduction practices. Had pt demonstrate self drainage technique today while giving verbal cues on correct hand placement and sequence. Pt demonstrated good amount of pressure and speed following verbal cueing. Pt would benefit from additional visits to ensure she is independent with the self drainage technique, reduce cording in LUE and to progress her towards independence with a home exercise program. She was given info to obtain a compression sleeve today for the times that her L UE swells.    Rehab Potential Excellent   Clinical Impairments Affecting Rehab Potential undergoing radiation   PT Frequency 2x / week   PT Duration 4 weeks   PT Treatment/Interventions ADLs/Self Care Home Management;DME Instruction;Therapeutic exercise;Orthotic Fit/Training;Patient/family education;Manual lymph drainage;Manual  techniques;Scar mobilization;Passive range of motion;Taping   PT Next Visit Plan add supine scap series, Cont MLD to left trunk and UE, assess for indep with self drainage,  myofascial to axillary scar, and AAROM/AROM/PROM to Lt shoulder. Progress HEP prn.    PT Home Exercise Plan supine dowel shoulder abduction   Consulted and Agree with Plan of Care Patient      Patient will benefit from skilled therapeutic intervention in order to improve the following deficits and impairments:  Decreased knowledge of precautions, Increased edema, Impaired UE functional use, Decreased strength, Decreased range of motion, Decreased scar mobility  Visit Diagnosis: Stiffness of left shoulder, not elsewhere classified - Plan: PT plan of care cert/re-cert  Localized edema - Plan: PT plan of care cert/re-cert  Muscle weakness (generalized) - Plan: PT plan of care cert/re-cert     Problem List Patient Active Problem List   Diagnosis Date Noted  . Cancer of left female breast  ([place]) 11/02/2015  . Moderate episode of recurrent major depressive disorder ([place]) 10/22/2015  . Malignant neoplasm of upper inner quadrant of female breast ([place]) 10/14/2015  . Diabetes ([place]) 09/24/2015  . Hypertension     Alexia Freestone 12/24/2015, 12:27 PM  [place], [place], [address] Phone: [phone]   Fax:  [phone]  Name: Lindsay Pacheco MRN: [id number] Date of Birth: Apr 07, 1960   Manus Gunning, PT 12/24/15 12:27 PM

## 2015-12-25 ENCOUNTER — Ambulatory Visit
Admission: RE | Admit: 2015-12-25 | Discharge: 2015-12-25 | Disposition: A | Discharge status: Home / Self Care | Payer: Medicare Other | Source: Ambulatory Visit | Attending: Radiation Oncology | Admitting: Radiation Oncology

## 2015-12-25 ENCOUNTER — Encounter: Payer: Self-pay | Admitting: Radiation Oncology

## 2015-12-25 VITALS — BP 108/72 | HR 87 | Temp 98.2°F | Resp 20 | Wt 264.0 lb

## 2015-12-25 DIAGNOSIS — C50211 Malignant neoplasm of upper-inner quadrant of right female breast: Secondary | ICD-10-CM

## 2015-12-25 DIAGNOSIS — Z510 Encounter for antineoplastic radiation therapy: Secondary | ICD-10-CM | POA: Diagnosis not present

## 2015-12-25 DIAGNOSIS — C50212 Malignant neoplasm of upper-inner quadrant of left female breast: Principal | ICD-10-CM

## 2015-12-25 NOTE — Progress Notes (Signed)
Weekly rad tx left breast 4/33 completed, no skin changes, using radiaplex bid, appetite good,  Has had sharp needle pain in breast, resolves quickly,  Was a little nauseated today 11:00 AM BP 108/72 (BP Location: Right Arm, Patient Position: Sitting, Cuff Size: Large)   Pulse 87   Temp 98.2 F (36.8 C) (Oral)   Resp 20   Wt 264 lb (119.7 kg)   BMI 41.35 kg/m   Wt Readings from Last 3 Encounters:  12/25/15 264 lb (119.7 kg)  12/22/15 264 lb 6.4 oz (119.9 kg)  12/03/15 265 lb 9.6 oz (120.5 kg)

## 2015-12-25 NOTE — Progress Notes (Signed)
Department of Radiation Oncology  Phone:  [phone] Fax:        [phone]  Weekly Treatment Note    Name: Lindsay Pacheco Date: 12/25/2015 MRN: [id number] DOB: December 07, 1960   Diagnosis:     ICD-9-CM ICD-10-CM   1. Bilateral malignant neoplasm of upper inner quadrant of breast in female, unspecified estrogen receptor status ([place]) 174.2 C50.211     C50.212      Current dose: 7.2 Gy  Current fraction: 4   MEDICATIONS: Current Outpatient Prescriptions  Medication Sig Dispense Refill  . acetaminophen (TYLENOL) 500 MG tablet Take 1,000 mg by mouth every 6 (six) hours as needed for moderate pain or headache.    Marland Kitchen atorvastatin (LIPITOR) 10 MG tablet Take 10 mg by mouth daily.    . divalproex (DEPAKOTE ER) 500 MG 24 hr tablet Take 1 tablet (500 mg total) by mouth at bedtime. 30 tablet 1  . docusate sodium (COLACE) 100 MG capsule Take 1 capsule (100 mg total) by mouth 2 (two) times daily. 10 capsule 0  . DULoxetine (CYMBALTA) 60 MG capsule Take 1 capsule (60 mg total) by mouth daily. 30 capsule 1  . hyaluronate sodium (RADIAPLEXRX) GEL Apply 1 application topically 2 (two) times daily.    Marland Kitchen lisinopril-hydrochlorothiazide (PRINZIDE,ZESTORETIC) 20-12.5 MG tablet Take 1 tablet by mouth daily.  6  . LORazepam (ATIVAN) 0.5 MG tablet Take 0.5 mg by mouth daily as needed for anxiety.    . metFORMIN (GLUCOPHAGE) 500 MG tablet Take 500 mg by mouth 2 (two) times daily with a meal.     . non-metallic deodorant (ALRA) MISC Apply 1 application topically daily as needed.    . psyllium (METAMUCIL SMOOTH TEXTURE) 28 % packet Take 1 packet by mouth at bedtime.     No current facility-administered medications for this encounter.      ALLERGIES: Adhesive [tape] and Sulfa antibiotics   LABORATORY DATA:  Lab Results  Component Value Date   WBC 11.5 (H) 11/02/2015   HGB 9.9 (L) 11/02/2015   HCT 30.5 (L) 11/02/2015   MCV 83.3 11/02/2015   PLT 252 11/02/2015   Lab Results  Component Value  Date   NA 137 10/28/2015   K 3.6 10/28/2015   CL 103 10/28/2015   CO2 23 10/28/2015   Lab Results  Component Value Date   ALT 22 10/06/2009   AST 22 10/06/2009   ALKPHOS 120 (H) 10/06/2009   BILITOT 0.6 10/06/2009     NARRATIVE: Lindsay Pacheco was seen today for weekly treatment management. The chart was checked and the patient's films were reviewed.  The patient has completed 4/33 fractions to the left breast. She reports a good appetite, though she has had some feelings of nausea today. She reports a sharp, needle-like pain in her breast which resolves itself quickly. The patient is using Radiaplex bid as directed.  PHYSICAL EXAMINATION: weight is 264 lb (119.7 kg). Her oral temperature is 98.2 F (36.8 C). Her blood pressure is 108/72 and her pulse is 87. Her respiration is 20.  The patient is well appearing.  ASSESSMENT: The patient is doing satisfactorily with treatment.  PLAN: We will continue with the patient's radiation treatment as planned.   --------------------------------------------------------------------------------   Tyler Pita, MD [place] Director and Director of Stereotactic Radiosurgery Direct Dial: [phone]  Fax: [phone] [email or web address]  Skype  [email or web address]  This document serves as a record of services personally performed by Tyler Pita, MD. It was created on  his behalf by Maryla Morrow, a trained medical scribe. The creation of this record is based on the scribe's personal observations and the provider's statements to them. This document has been checked and approved by the attending provider.

## 2015-12-29 ENCOUNTER — Ambulatory Visit
Admission: RE | Admit: 2015-12-29 | Discharge: 2015-12-29 | Disposition: A | Discharge status: Home / Self Care | Payer: Medicare Other | Source: Ambulatory Visit | Attending: Radiation Oncology | Admitting: Radiation Oncology

## 2015-12-29 DIAGNOSIS — Z510 Encounter for antineoplastic radiation therapy: Secondary | ICD-10-CM | POA: Diagnosis not present

## 2015-12-30 ENCOUNTER — Ambulatory Visit
Admission: RE | Admit: 2015-12-30 | Discharge: 2015-12-30 | Disposition: A | Discharge status: Home / Self Care | Payer: Medicare Other | Source: Ambulatory Visit | Attending: Radiation Oncology | Admitting: Radiation Oncology

## 2015-12-30 DIAGNOSIS — Z510 Encounter for antineoplastic radiation therapy: Secondary | ICD-10-CM | POA: Diagnosis not present

## 2015-12-31 ENCOUNTER — Ambulatory Visit
Admission: RE | Admit: 2015-12-31 | Discharge: 2015-12-31 | Disposition: A | Discharge status: Home / Self Care | Payer: Medicare Other | Source: Ambulatory Visit | Attending: Radiation Oncology | Admitting: Radiation Oncology

## 2015-12-31 DIAGNOSIS — Z510 Encounter for antineoplastic radiation therapy: Secondary | ICD-10-CM | POA: Diagnosis not present

## 2016-01-01 ENCOUNTER — Ambulatory Visit
Admission: RE | Admit: 2016-01-01 | Discharge: 2016-01-01 | Disposition: A | Discharge status: Home / Self Care | Payer: Medicare Other | Source: Ambulatory Visit | Attending: Radiation Oncology | Admitting: Radiation Oncology

## 2016-01-01 ENCOUNTER — Encounter: Payer: Self-pay | Admitting: Radiation Oncology

## 2016-01-01 VITALS — BP 126/73 | HR 98 | Temp 98.2°F | Resp 20 | Wt 267.2 lb

## 2016-01-01 DIAGNOSIS — Z510 Encounter for antineoplastic radiation therapy: Secondary | ICD-10-CM | POA: Diagnosis not present

## 2016-01-01 DIAGNOSIS — C50211 Malignant neoplasm of upper-inner quadrant of right female breast: Secondary | ICD-10-CM

## 2016-01-01 DIAGNOSIS — C50212 Malignant neoplasm of upper-inner quadrant of left female breast: Principal | ICD-10-CM

## 2016-01-01 NOTE — Progress Notes (Signed)
Department of Radiation Oncology  Phone:  [phone] Fax:        [phone]  Weekly Treatment Note    Name: Lindsay Pacheco Date: 01/01/2016 MRN: [id number] DOB: 1961/01/03   Diagnosis:     ICD-9-CM ICD-10-CM   1. Bilateral malignant neoplasm of upper inner quadrant of breast in female, unspecified estrogen receptor status ([facility]) 174.2 C50.211     C50.212      Current dose: 14.4 Gy  Current fraction: 8   MEDICATIONS: Current Outpatient Prescriptions  Medication Sig Dispense Refill  . acetaminophen (TYLENOL) 500 MG tablet Take 1,000 mg by mouth every 6 (six) hours as needed for moderate pain or headache.    Marland Kitchen atorvastatin (LIPITOR) 10 MG tablet Take 10 mg by mouth daily.    . divalproex (DEPAKOTE ER) 500 MG 24 hr tablet Take 1 tablet (500 mg total) by mouth at bedtime. 30 tablet 1  . docusate sodium (COLACE) 100 MG capsule Take 1 capsule (100 mg total) by mouth 2 (two) times daily. 10 capsule 0  . DULoxetine (CYMBALTA) 60 MG capsule Take 1 capsule (60 mg total) by mouth daily. 30 capsule 1  . hyaluronate sodium (RADIAPLEXRX) GEL Apply 1 application topically 2 (two) times daily.    Marland Kitchen lisinopril-hydrochlorothiazide (PRINZIDE,ZESTORETIC) 20-12.5 MG tablet Take 1 tablet by mouth daily.  6  . LORazepam (ATIVAN) 0.5 MG tablet Take 0.5 mg by mouth daily as needed for anxiety.    . metFORMIN (GLUCOPHAGE) 500 MG tablet Take 500 mg by mouth 2 (two) times daily with a meal.     . non-metallic deodorant (ALRA) MISC Apply 1 application topically daily as needed.    . psyllium (METAMUCIL SMOOTH TEXTURE) 28 % packet Take 1 packet by mouth at bedtime.     No current facility-administered medications for this encounter.      ALLERGIES: Adhesive [tape] and Sulfa antibiotics   LABORATORY DATA:  Lab Results  Component Value Date   WBC 11.5 (H) 11/02/2015   HGB 9.9 (L) 11/02/2015   HCT 30.5 (L) 11/02/2015   MCV 83.3 11/02/2015   PLT 252 11/02/2015   Lab Results  Component  Value Date   NA 137 10/28/2015   K 3.6 10/28/2015   CL 103 10/28/2015   CO2 23 10/28/2015   Lab Results  Component Value Date   ALT 22 10/06/2009   AST 22 10/06/2009   ALKPHOS 120 (H) 10/06/2009   BILITOT 0.6 10/06/2009     NARRATIVE: KADEY Pacheco was seen today for weekly treatment management. The chart was checked and the patient's films were reviewed.  The patient has completed 8/33 fractions to the left breast. The patient reports slight fatigue. She has a good appetite. She reports using Radiaplex bid as directed. The patient is without complaint.  PHYSICAL EXAMINATION: weight is 267 lb 3.2 oz (121.2 kg). Her oral temperature is 98.2 F (36.8 C). Her blood pressure is 126/73 and her pulse is 98. Her respiration is 20.  The patient is well appearing.  ASSESSMENT: The patient is doing satisfactorily with treatment.  PLAN: We will continue with the patient's radiation treatment as planned.    ------------------------------------------------  [name], MD, PhD  This document serves as a record of services personally performed by Kyung Rudd, MD. It was created on his behalf by Maryla Morrow, a trained medical scribe. The creation of this record is based on the scribe's personal observations and the provider's statements to them. This document has been checked  and approved by the attending provider.

## 2016-01-01 NOTE — Progress Notes (Signed)
Weekly rd txs left breast 8/33 completed, no skin changes since last week, using radiaplex bid, appetite good, slight fatigue 10:55 AM BP 126/73 (BP Location: Right Arm, Patient Position: Sitting, Cuff Size: Large)   Pulse 98   Temp 98.2 F (36.8 C) (Oral)   Resp 20   Wt 267 lb 3.2 oz (121.2 kg)   BMI 41.85 kg/m   Wt Readings from Last 3 Encounters:  01/01/16 267 lb 3.2 oz (121.2 kg)  12/25/15 264 lb (119.7 kg)  12/22/15 264 lb 6.4 oz (119.9 kg)

## 2016-01-05 ENCOUNTER — Ambulatory Visit: Payer: Medicare Other | Admitting: Physical Therapy

## 2016-01-05 ENCOUNTER — Ambulatory Visit
Admission: RE | Admit: 2016-01-05 | Discharge: 2016-01-05 | Disposition: A | Discharge status: Home / Self Care | Payer: Medicare Other | Source: Ambulatory Visit | Attending: Radiation Oncology | Admitting: Radiation Oncology

## 2016-01-05 DIAGNOSIS — Z510 Encounter for antineoplastic radiation therapy: Secondary | ICD-10-CM | POA: Diagnosis not present

## 2016-01-06 ENCOUNTER — Ambulatory Visit
Admission: RE | Admit: 2016-01-06 | Discharge: 2016-01-06 | Disposition: A | Discharge status: Home / Self Care | Payer: Medicare Other | Source: Ambulatory Visit | Attending: Radiation Oncology | Admitting: Radiation Oncology

## 2016-01-06 DIAGNOSIS — Z510 Encounter for antineoplastic radiation therapy: Secondary | ICD-10-CM | POA: Diagnosis not present

## 2016-01-07 ENCOUNTER — Ambulatory Visit
Admission: RE | Admit: 2016-01-07 | Discharge: 2016-01-07 | Disposition: A | Discharge status: Home / Self Care | Payer: Medicare Other | Source: Ambulatory Visit | Attending: Radiation Oncology | Admitting: Radiation Oncology

## 2016-01-07 DIAGNOSIS — Z510 Encounter for antineoplastic radiation therapy: Secondary | ICD-10-CM | POA: Diagnosis not present

## 2016-01-08 ENCOUNTER — Ambulatory Visit
Admission: RE | Admit: 2016-01-08 | Discharge: 2016-01-08 | Disposition: A | Discharge status: Home / Self Care | Payer: Medicare Other | Source: Ambulatory Visit | Attending: Radiation Oncology | Admitting: Radiation Oncology

## 2016-01-08 ENCOUNTER — Encounter: Payer: Self-pay | Admitting: Radiation Oncology

## 2016-01-08 ENCOUNTER — Ambulatory Visit: Payer: Medicare Other | Attending: General Surgery | Admitting: Physical Therapy

## 2016-01-08 ENCOUNTER — Encounter: Payer: Self-pay | Admitting: Physical Therapy

## 2016-01-08 VITALS — BP 124/76 | HR 92 | Temp 97.5°F | Resp 20 | Wt 267.0 lb

## 2016-01-08 DIAGNOSIS — R600 Localized edema: Secondary | ICD-10-CM

## 2016-01-08 DIAGNOSIS — Z510 Encounter for antineoplastic radiation therapy: Secondary | ICD-10-CM | POA: Diagnosis not present

## 2016-01-08 DIAGNOSIS — C50212 Malignant neoplasm of upper-inner quadrant of left female breast: Principal | ICD-10-CM

## 2016-01-08 DIAGNOSIS — C50211 Malignant neoplasm of upper-inner quadrant of right female breast: Secondary | ICD-10-CM

## 2016-01-08 NOTE — Progress Notes (Signed)
Department of Radiation Oncology  Phone:  [phone] Fax:        [phone]  Weekly Treatment Note    Name: Lindsay Pacheco Date: 01/08/2016 MRN: [id number] DOB: 1960-03-02   Diagnosis:     ICD-9-CM ICD-10-CM   1. Bilateral malignant neoplasm of upper inner quadrant of breast in female, unspecified estrogen receptor status ([place]) 174.2 C50.211     C50.212      Current dose: 21.6 Gy  Current fraction: 12   MEDICATIONS: Current Outpatient Prescriptions  Medication Sig Dispense Refill  . acetaminophen (TYLENOL) 500 MG tablet Take 1,000 mg by mouth every 6 (six) hours as needed for moderate pain or headache.    Marland Kitchen atorvastatin (LIPITOR) 10 MG tablet Take 10 mg by mouth daily.    . divalproex (DEPAKOTE ER) 500 MG 24 hr tablet Take 1 tablet (500 mg total) by mouth at bedtime. 30 tablet 1  . docusate sodium (COLACE) 100 MG capsule Take 1 capsule (100 mg total) by mouth 2 (two) times daily. 10 capsule 0  . DULoxetine (CYMBALTA) 60 MG capsule Take 1 capsule (60 mg total) by mouth daily. 30 capsule 1  . hyaluronate sodium (RADIAPLEXRX) GEL Apply 1 application topically 2 (two) times daily.    Marland Kitchen lisinopril-hydrochlorothiazide (PRINZIDE,ZESTORETIC) 20-12.5 MG tablet Take 1 tablet by mouth daily.  6  . LORazepam (ATIVAN) 0.5 MG tablet Take 0.5 mg by mouth daily as needed for anxiety.    . metFORMIN (GLUCOPHAGE) 500 MG tablet Take 500 mg by mouth 2 (two) times daily with a meal.     . non-metallic deodorant (ALRA) MISC Apply 1 application topically daily as needed.    . psyllium (METAMUCIL SMOOTH TEXTURE) 28 % packet Take 1 packet by mouth at bedtime.     No current facility-administered medications for this encounter.      ALLERGIES: Adhesive [tape] and Sulfa antibiotics   LABORATORY DATA:  Lab Results  Component Value Date   WBC 11.5 (H) 11/02/2015   HGB 9.9 (L) 11/02/2015   HCT 30.5 (L) 11/02/2015   MCV 83.3 11/02/2015   PLT 252 11/02/2015   Lab Results  Component Value  Date   NA 137 10/28/2015   K 3.6 10/28/2015   CL 103 10/28/2015   CO2 23 10/28/2015   Lab Results  Component Value Date   ALT 22 10/06/2009   AST 22 10/06/2009   ALKPHOS 120 (H) 10/06/2009   BILITOT 0.6 10/06/2009     NARRATIVE: Lindsay Pacheco was seen today for weekly treatment management. The chart was checked and the patient's films were reviewed.  Weekly rad txs left breast, slight ppink color, no skin breakddown, slight swelling under axilla area,  Slight fatigue, feels blah from family going back home, appetite good, no pain 11:16 AM BP 124/76 (BP Location: Right Arm, Patient Position: Sitting, Cuff Size: Large)   Pulse 92   Temp 97.5 F (36.4 C) (Oral)   Resp 20   Wt 267 lb (121.1 kg)   BMI 41.82 kg/m   Wt Readings from Last 3 Encounters:  01/08/16 267 lb (121.1 kg)  01/01/16 267 lb 3.2 oz (121.2 kg)  12/25/15 264 lb (119.7 kg)    PHYSICAL EXAMINATION: weight is 267 lb (121.1 kg). Her oral temperature is 97.5 F (36.4 C). Her blood pressure is 124/76 and her pulse is 92. Her respiration is 20.      Diffuse mild erythema in the treatment area. No desquamation.  ASSESSMENT: The patient is doing  satisfactorily with treatment.  PLAN: We will continue with the patient's radiation treatment as planned.

## 2016-01-08 NOTE — Progress Notes (Signed)
Weekly rad txs left breast, slight ppink color, no skin breakddown, slight swelling under axilla area,  Slight fatigue, feels blah from family going back home, appetite good, no pain 10:32 AM BP 124/76 (BP Location: Right Arm, Patient Position: Sitting, Cuff Size: Large)   Pulse 92   Temp 97.5 F (36.4 C) (Oral)   Resp 20   Wt 267 lb (121.1 kg)   BMI 41.82 kg/m   Wt Readings from Last 3 Encounters:  01/08/16 267 lb (121.1 kg)  01/01/16 267 lb 3.2 oz (121.2 kg)  12/25/15 264 lb (119.7 kg)

## 2016-01-08 NOTE — Therapy (Signed)
[place], [place], [address] Phone: [phone]   Fax:  [phone]  Physical Therapy Treatment  Patient Details  Name: Lindsay Pacheco MRN: [id number] Date of Birth: [birth year] Referring Provider: Donne Hazel  Encounter Date: 01/08/2016      PT End of Session - 01/08/16 0937    Visit Number 7   Number of Visits 17   Date for PT Re-Evaluation 01/21/16   PT Start Time 0851   PT Stop Time 0933   PT Time Calculation (min) 42 min   Activity Tolerance Patient tolerated treatment well   Behavior During Therapy [facility] for tasks assessed/performed      Past Medical History:  Diagnosis Date  . Anxiety   . Arthritis    "knees" (11/02/2015)  . Breast cancer, left breast ([place]) 09/30/2015  . Depression   . Diabetes mellitus, type II ([place])   . Diverticulitis 1987  . Hypertension   . Irritable bowel syndrome (IBS)   . Sleep apnea    does not use CPAP but "I'm suppose to" (11/02/2015)    Past Surgical History:  Procedure Laterality Date  . BREAST BIOPSY Left 09/2015  . BREAST LUMPECTOMY WITH AXILLARY LYMPH NODE BIOPSY Left 11/02/2015   BREAST LUMPECTOMY WITH BRACKETED RADIOACTIVE SEED AND LEFT AXILLARY SENTINEL LYMPH NODE BIOPSY   . BREAST LUMPECTOMY WITH RADIOACTIVE SEED AND SENTINEL LYMPH NODE BIOPSY Left 11/02/2015   Procedure: LEFT BREAST LUMPECTOMY WITH BRACKETED RADIOACTIVE SEED AND LEFT AXILLARY SENTINEL LYMPH NODE BIOPSY;  Surgeon: [name], MD;  Location: [place];  Service: General;  Laterality: Left;  . COLONOSCOPY N/A 12/09/2015   Procedure: COLONOSCOPY;  Surgeon: [name], MD;  [facility];  Service: Endoscopy;  Laterality: N/A;  1:00  . EVACUATION BREAST HEMATOMA Left 11/02/2015  . EVACUATION BREAST HEMATOMA Left 11/02/2015   Procedure: EVACUATION HEMATOMA BREAST;  Surgeon: [name], MD;  Location: [place];  Service: General;   Laterality: Left;  . JOINT REPLACEMENT    . KNEE ARTHROSCOPY Right 2000s  . LAPAROSCOPIC CHOLECYSTECTOMY  1995  . POLYPECTOMY  12/09/2015   Procedure: POLYPECTOMY;  Surgeon: [name], MD;  [facility];  Service: Endoscopy;;  multiple  . TOTAL KNEE ARTHROPLASTY Right 2011    There were no vitals filed for this visit.      Subjective Assessment - 01/08/16 0853    Subjective I think I am swelling more. I don't know if it could be the radiation. I also feel weepy (like emotional), can that be from the radiation?   Pertinent History breast cancer left upper quadrant, er/pr positive, left lumpectomy 11/02/15, pt will see oncologist tomorrow to determine if she will need chemo, she will have to have radiation in the future, R TKA   Patient Stated Goals to get back to normal   Currently in Pain? Yes   Pain Score 1    Pain Location Elbow   Pain Orientation Left;Upper;Medial   Pain Descriptors / Indicators Tender   Pain Type Chronic pain   Pain Onset 1 to 4 weeks ago   Pain Frequency Intermittent               LYMPHEDEMA/ONCOLOGY QUESTIONNAIRE - 01/08/16 0857      Left Upper Extremity Lymphedema   15 cm Proximal to Olecranon Process 40.5 cm   Olecranon Process 32.5 cm   15 cm Proximal to Ulnar Styloid Process 27.5 cm  [place] Adult PT Treatment/Exercise - 01/08/16 0001      Manual Therapy   Manual Therapy Edema management   Myofascial Release attempted myofascial to cording but cording not easily palpable   Manual Lymphatic Drainage (MLD) In Supine: Short neck, superficial and deep abdomen, Rt axilla and Lt inguinal nodes, Lt axillo-inguinal and anterior inter-axillary anastomosis, focused on area of edema in left trunk, right sidelying, establishment of posterior interaxillary pathway, continued work on left trunk, back to supine, retracing all steps.                        [facility] Goals - 12/24/15 1107       CC Long Term Goal  #1   Title Pt will demonstrate 165 degrees of left shoulder abduction to allow her to reach items out to sides   Baseline 118; P/ROM with stretching approximately 130 degrees-12/03/15, 12/14/15- 165 degrees   Time 4   Period Weeks   Status Achieved     CC Long Term Goal  #2   Title Pt will be able to independently verbalize lymphedema risk reduction strategies   Baseline 12/14/15- pt required mod cues, 12/24/15- pt independent with this   Time 4   Period Weeks   Status Achieved     CC Long Term Goal  #3   Title Pt will be independent in a home exercise program for continued strengthening and stretching   Time 4   Period Weeks   Status On-going     CC Long Term Goal  #4   Title Pt will be independent in self drainage technique for management of localized swelling in left trunk   Baseline 12/14/15- instructed pt this visit, 12/24/15- pt required minimal cueing for this today   Time 4   Period Weeks   Status On-going     CC Long Term Goal  #5   Title Pt will report a 75% improvement in swelling in left trunk for improved comfort   Baseline Pt reported this did feel a little better today but did not quantify-12/03/15, 12/14/15- pt reports at least 50%, 12/24/15- at least 50% still   Time 4   Period Weeks   Status On-going            Plan - 01/08/16 0936    Clinical Impression Statement Patient presented today with more swelling at elbow and upper arm. She also demonstrated more swelling in posterior upper left quadrant. A focus was placed toay on manual lymphatic drainage to help manage increase in edema. Attempted myofascial to cording but no cording was palpaple today. She has been attempting self drainage at home but has trouble reaching her left trunk.    Rehab Potential Excellent   Clinical Impairments Affecting Rehab Potential undergoing radiation   PT Frequency 2x / week   PT Duration 4 weeks   PT Treatment/Interventions ADLs/Self Care Home  Management;DME Instruction;Therapeutic exercise;Orthotic Fit/Training;Patient/family education;Manual lymph drainage;Manual techniques;Scar mobilization;Passive range of motion;Taping   PT Next Visit Plan assess goals, add supine scap series, Cont MLD to left trunk and UE, assess for indep with self drainage,  myofascial to axillary scar, and AAROM/AROM/PROM to Lt shoulder. Progress HEP prn.    PT Home Exercise Plan supine dowel shoulder abduction   Consulted and Agree with Plan of Care Patient      Patient will benefit from skilled therapeutic intervention in order to improve the following deficits and impairments:  Decreased knowledge of precautions, Increased  edema, Impaired UE functional use, Decreased strength, Decreased range of motion, Decreased scar mobility  Visit Diagnosis: Localized edema     Problem List Patient Active Problem List   Diagnosis Date Noted  . Moderate episode of recurrent major depressive disorder ([place]) 10/22/2015  . Malignant neoplasm of upper inner quadrant of female breast ([place]) 10/14/2015  . Diabetes ([place]) 09/24/2015  . Hypertension     Allyson Sabal [facility] 01/08/2016, 9:38 AM  [place], [place], [address] Phone: [phone]   Fax:  [phone]  Name: Lindsay Pacheco MRN: [id number] Date of Birth: 01/27/1960  Manus Gunning, PT 01/08/16 9:39 AM

## 2016-01-11 ENCOUNTER — Ambulatory Visit: Payer: Medicare Other

## 2016-01-11 ENCOUNTER — Ambulatory Visit
Admission: RE | Admit: 2016-01-11 | Discharge: 2016-01-11 | Disposition: A | Discharge status: Home / Self Care | Payer: Medicare Other | Source: Ambulatory Visit | Attending: Radiation Oncology | Admitting: Radiation Oncology

## 2016-01-11 DIAGNOSIS — Z510 Encounter for antineoplastic radiation therapy: Secondary | ICD-10-CM | POA: Diagnosis not present

## 2016-01-11 DIAGNOSIS — R600 Localized edema: Secondary | ICD-10-CM | POA: Diagnosis not present

## 2016-01-11 NOTE — Therapy (Addendum)
[place], [place], [address] Phone: [phone]   Fax:  [phone]  Physical Therapy Treatment  Patient Details  Name: Lindsay Pacheco MRN: [id number] Date of Birth: 1960-02-06 Referring Provider: Donne Hazel  Encounter Date: 01/11/2016      PT End of Session - 01/11/16 0940    Visit Number 8   Number of Visits 17   Date for PT Re-Evaluation 01/21/16   PT Start Time 5300  Pt arrived late   PT Stop Time 0931   PT Time Calculation (min) 32 min   Activity Tolerance Patient tolerated treatment well   Behavior During Therapy [facility] for tasks assessed/performed      Past Medical History:  Diagnosis Date  . Anxiety   . Arthritis    "knees" (11/02/2015)  . Breast cancer, left breast ([place]) 09/30/2015  . Depression   . Diabetes mellitus, type II ([place])   . Diverticulitis 1987  . Hypertension   . Irritable bowel syndrome (IBS)   . Sleep apnea    does not use CPAP but "I'm suppose to" (11/02/2015)    Past Surgical History:  Procedure Laterality Date  . BREAST BIOPSY Left 09/2015  . BREAST LUMPECTOMY WITH AXILLARY LYMPH NODE BIOPSY Left 11/02/2015   BREAST LUMPECTOMY WITH BRACKETED RADIOACTIVE SEED AND LEFT AXILLARY SENTINEL LYMPH NODE BIOPSY   . BREAST LUMPECTOMY WITH RADIOACTIVE SEED AND SENTINEL LYMPH NODE BIOPSY Left 11/02/2015   Procedure: LEFT BREAST LUMPECTOMY WITH BRACKETED RADIOACTIVE SEED AND LEFT AXILLARY SENTINEL LYMPH NODE BIOPSY;  Surgeon: [name], MD;  Location: [place];  Service: General;  Laterality: Left;  . COLONOSCOPY N/A 12/09/2015   Procedure: COLONOSCOPY;  Surgeon: [name], MD;  [facility];  Service: Endoscopy;  Laterality: N/A;  1:00  . EVACUATION BREAST HEMATOMA Left 11/02/2015  . EVACUATION BREAST HEMATOMA Left 11/02/2015   Procedure: EVACUATION HEMATOMA BREAST;  Surgeon: [name], MD;  Location: [place];   Service: General;  Laterality: Left;  . JOINT REPLACEMENT    . KNEE ARTHROSCOPY Right 2000s  . LAPAROSCOPIC CHOLECYSTECTOMY  1995  . POLYPECTOMY  12/09/2015   Procedure: POLYPECTOMY;  Surgeon: [name], MD;  [facility];  Service: Endoscopy;;  multiple  . TOTAL KNEE ARTHROPLASTY Right 2011    There were no vitals filed for this visit.      Subjective Assessment - 01/11/16 0903    Subjective My swelling just keeps getting worse but it did feel better after last session. I'm sorry I'm running late today, didn't sleep good/hurt all night and was just slow getting up.    Pertinent History breast cancer left upper quadrant, er/pr positive, left lumpectomy 11/02/15, pt will see oncologist tomorrow to determine if she will need chemo, she will have to have radiation in the future, R TKA   Patient Stated Goals to get back to normal   Currently in Pain? No/denies   Pain Score 4    Pain Location Breast   Pain Orientation Left   Pain Descriptors / Indicators Dull   Pain Type Chronic pain   Pain Onset Yesterday   Pain Frequency Intermittent   Aggravating Factors  radiation, it just started last night   Pain Relieving Factors hopefully the massage will help                         [facility] Adult PT Treatment/Exercise - 01/11/16 0001  Manual Therapy   Manual Lymphatic Drainage (MLD) In Supine: Short neck, superficial and deep abdomen, Rt axilla and Lt inguinal nodes, Lt axillo-inguinal and anterior inter-axillary anastomosis, focused on area of edema in left trunk, right sidelying, establishment of posterior interaxillary pathway, continued work on left trunk, back to supine, retracing all steps.                        [facility] Goals - 12/24/15 1107      CC Long Term Goal  #1   Title Pt will demonstrate 165 degrees of left shoulder abduction to allow her to reach items out to sides   Baseline 118; P/ROM with stretching  approximately 130 degrees-12/03/15, 12/14/15- 165 degrees   Time 4   Period Weeks   Status Achieved     CC Long Term Goal  #2   Title Pt will be able to independently verbalize lymphedema risk reduction strategies   Baseline 12/14/15- pt required mod cues, 12/24/15- pt independent with this   Time 4   Period Weeks   Status Achieved     CC Long Term Goal  #3   Title Pt will be independent in a home exercise program for continued strengthening and stretching   Time 4   Period Weeks   Status On-going     CC Long Term Goal  #4   Title Pt will be independent in self drainage technique for management of localized swelling in left trunk   Baseline 12/14/15- instructed pt this visit, 12/24/15- pt required minimal cueing for this today   Time 4   Period Weeks   Status On-going     CC Long Term Goal  #5   Title Pt will report a 75% improvement in swelling in left trunk for improved comfort   Baseline Pt reported this did feel a little better today but did not quantify-12/03/15, 12/14/15- pt reports at least 50%, 12/24/15- at least 50% still   Time 4   Period Weeks   Status On-going            Plan - 01/11/16 0941    Clinical Impression Statement Pt reports feeling her swelling getting worse day by day with radiation treatments, though her skin is doing exceptionally well with no redness, only tenderness reported. Cotniued to focus on manual lymph drainage today as pt was running late and her primary complaint is her swelling. She reported good relief by end of session.    Rehab Potential Excellent   Clinical Impairments Affecting Rehab Potential undergoing radiation   PT Frequency 2x / week   PT Duration 4 weeks   PT Treatment/Interventions ADLs/Self Care Home Management;DME Instruction;Therapeutic exercise;Orthotic Fit/Training;Patient/family education;Manual lymph drainage;Manual techniques;Scar mobilization;Passive range of motion;Taping   PT Next Visit Plan assess goals, add  supine scap series, Cont MLD to left trunk and UE, assess for indep with self drainage,  myofascial to axillary scar, and AAROM/AROM/PROM to Lt shoulder. Progress HEP prn.    PT Home Exercise Plan supine dowel shoulder abduction   Consulted and Agree with Plan of Care Patient      Patient will benefit from skilled therapeutic intervention in order to improve the following deficits and impairments:  Decreased knowledge of precautions, Increased edema, Impaired UE functional use, Decreased strength, Decreased range of motion, Decreased scar mobility  Visit Diagnosis: Localized edema     Problem List Patient Active Problem List   Diagnosis Date Noted  . Moderate episode  of recurrent major depressive disorder ([place]) 10/22/2015  . Malignant neoplasm of upper inner quadrant of female breast ([place]) 10/14/2015  . Diabetes ([place]) 09/24/2015  . Hypertension     Otelia Limes, PTA 01/11/2016, 9:43 AM  [place], [place], [address] Phone: [phone]   Fax:  [phone]  Name: KIYANI JERNIGAN MRN: [id number] Date of Birth: [birth year]  PHYSICAL THERAPY DISCHARGE SUMMARY  Visits from Start of Care: 8  Current functional level related to goals / functional outcomes: See above   Remaining deficits: See above   Education / Equipment: See above  Plan: Patient agrees to discharge.  Patient goals were partially met. Patient is being discharged due to not returning since the last visit.  ?????     Allyson Sabal [place], [place] 01/26/17 5:29 PM

## 2016-01-12 ENCOUNTER — Ambulatory Visit
Admission: RE | Admit: 2016-01-12 | Discharge: 2016-01-12 | Disposition: A | Discharge status: Home / Self Care | Payer: Medicare Other | Source: Ambulatory Visit | Attending: Radiation Oncology | Admitting: Radiation Oncology

## 2016-01-12 DIAGNOSIS — Z888 Allergy status to other drugs, medicaments and biological substances status: Secondary | ICD-10-CM | POA: Insufficient documentation

## 2016-01-12 DIAGNOSIS — C50212 Malignant neoplasm of upper-inner quadrant of left female breast: Secondary | ICD-10-CM | POA: Diagnosis not present

## 2016-01-12 DIAGNOSIS — Z7984 Long term (current) use of oral hypoglycemic drugs: Secondary | ICD-10-CM | POA: Diagnosis not present

## 2016-01-12 DIAGNOSIS — Z79899 Other long term (current) drug therapy: Secondary | ICD-10-CM | POA: Diagnosis not present

## 2016-01-12 DIAGNOSIS — Z510 Encounter for antineoplastic radiation therapy: Secondary | ICD-10-CM | POA: Diagnosis present

## 2016-01-13 ENCOUNTER — Ambulatory Visit
Admission: RE | Admit: 2016-01-13 | Discharge: 2016-01-13 | Disposition: A | Discharge status: Home / Self Care | Payer: Medicare Other | Source: Ambulatory Visit | Attending: Radiation Oncology | Admitting: Radiation Oncology

## 2016-01-13 DIAGNOSIS — Z510 Encounter for antineoplastic radiation therapy: Secondary | ICD-10-CM | POA: Diagnosis not present

## 2016-01-14 ENCOUNTER — Ambulatory Visit
Admission: RE | Admit: 2016-01-14 | Discharge: 2016-01-14 | Disposition: A | Discharge status: Home / Self Care | Payer: Medicare Other | Source: Ambulatory Visit | Attending: Radiation Oncology | Admitting: Radiation Oncology

## 2016-01-14 DIAGNOSIS — Z510 Encounter for antineoplastic radiation therapy: Secondary | ICD-10-CM | POA: Diagnosis not present

## 2016-01-15 ENCOUNTER — Ambulatory Visit: Payer: Medicare Other

## 2016-01-15 ENCOUNTER — Ambulatory Visit: Payer: Medicare Other | Admitting: Physical Therapy

## 2016-01-15 ENCOUNTER — Encounter: Payer: Self-pay | Admitting: Radiation Oncology

## 2016-01-18 ENCOUNTER — Ambulatory Visit
Admission: RE | Admit: 2016-01-18 | Discharge: 2016-01-18 | Disposition: A | Discharge status: Home / Self Care | Payer: Medicare Other | Source: Ambulatory Visit | Attending: Radiation Oncology | Admitting: Radiation Oncology

## 2016-01-18 DIAGNOSIS — Z510 Encounter for antineoplastic radiation therapy: Secondary | ICD-10-CM | POA: Diagnosis not present

## 2016-01-19 ENCOUNTER — Ambulatory Visit
Admission: RE | Admit: 2016-01-19 | Discharge: 2016-01-19 | Disposition: A | Discharge status: Home / Self Care | Payer: Medicare Other | Source: Ambulatory Visit | Attending: Radiation Oncology | Admitting: Radiation Oncology

## 2016-01-19 DIAGNOSIS — Z510 Encounter for antineoplastic radiation therapy: Secondary | ICD-10-CM | POA: Diagnosis not present

## 2016-01-20 ENCOUNTER — Ambulatory Visit: Payer: Medicare Other

## 2016-01-20 ENCOUNTER — Ambulatory Visit (HOSPITAL_COMMUNITY): Payer: Self-pay | Admitting: Hematology & Oncology

## 2016-01-20 ENCOUNTER — Telehealth: Payer: Self-pay | Admitting: *Deleted

## 2016-01-20 NOTE — Telephone Encounter (Signed)
Returned call to patient  That she was having severe lower abdominal pain,  Cramping, and diarrhea, she is going to her primary MD today, has cancelled rad tx , let her know that we are treating her breast with radiation and this isn't from that causing her these symptoms, however did say that the stomach bug is rampet, and she should go on low fiber diet, take imodium, drink clear liquids, brat diet  10:09 AM

## 2016-01-21 ENCOUNTER — Ambulatory Visit: Payer: Medicare Other

## 2016-01-21 ENCOUNTER — Ambulatory Visit (HOSPITAL_COMMUNITY): Payer: Self-pay | Admitting: Hematology & Oncology

## 2016-01-21 ENCOUNTER — Ambulatory Visit (HOSPITAL_COMMUNITY): Payer: Self-pay | Admitting: Psychiatry

## 2016-01-22 ENCOUNTER — Encounter: Payer: Self-pay | Admitting: Physical Therapy

## 2016-01-22 ENCOUNTER — Encounter: Payer: Self-pay | Admitting: Radiation Oncology

## 2016-01-22 ENCOUNTER — Ambulatory Visit: Payer: Medicare Other

## 2016-01-22 NOTE — Progress Notes (Signed)
BH MD/PA/NP OP Progress Note  01/26/2016 4:31 PM Lindsay Pacheco  MRN:  [id number]  Chief Complaint:  Chief Complaint    Depression; Follow-up     Subjective:  "My family causes me lots of stress, and I have cancer." HPI:  Patient presents for follow up appointment. She was very upset with clinic rule to arrive earlier than appointment. She states that she has lots of doctor's appointment and difficult to remember it. She talks about her stress with her family and having cancer. She ask how she can be discontinued her medication (stating that she does not like to come back here). She later apologizes her comments and agrees to try increase her medication. She feels helpless. She has used marijuana. She reports passive SI.     Visit Diagnosis:    ICD-9-CM ICD-10-CM   1. Moderate episode of recurrent major depressive disorder (HCC) 296.32 F33.1 DULoxetine (CYMBALTA) 60 MG capsule     Valproic acid level     TSH     Basic Metabolic Panel (BMET)     CBC    Past Psychiatric History:  Outpatient: used to be seen at [facility] for counseling Psychiatry admission: [facility] in 2012 for depression, SI of overdosing Ativan, IOP Previous suicide attempt: SI of overdosing Ativan in 2012, no SIB Past trials of medication: Paxil (felt numb), Effexor, Abilify, Klonopin,    Past Medical History:  Past Medical History:  Diagnosis Date  . Anxiety   . Arthritis    "knees" (11/02/2015)  . Breast cancer, left breast ([place]) 09/30/2015  . Depression   . Diabetes mellitus, type II ([place])   . Diverticulitis 1987  . Hypertension   . Irritable bowel syndrome (IBS)   . Sleep apnea    does not use CPAP but "I'm suppose to" (11/02/2015)    Past Surgical History:  Procedure Laterality Date  . BREAST BIOPSY Left 09/2015  . BREAST LUMPECTOMY WITH AXILLARY LYMPH NODE BIOPSY Left 11/02/2015   BREAST LUMPECTOMY WITH BRACKETED RADIOACTIVE SEED AND LEFT AXILLARY SENTINEL LYMPH NODE BIOPSY   . BREAST  LUMPECTOMY WITH RADIOACTIVE SEED AND SENTINEL LYMPH NODE BIOPSY Left 11/02/2015   Procedure: LEFT BREAST LUMPECTOMY WITH BRACKETED RADIOACTIVE SEED AND LEFT AXILLARY SENTINEL LYMPH NODE BIOPSY;  Surgeon: [name], MD;  Location: [place];  Service: General;  Laterality: Left;  . COLONOSCOPY N/A 12/09/2015   Procedure: COLONOSCOPY;  Surgeon: [name], MD;  [facility];  Service: Endoscopy;  Laterality: N/A;  1:00  . EVACUATION BREAST HEMATOMA Left 11/02/2015  . EVACUATION BREAST HEMATOMA Left 11/02/2015   Procedure: EVACUATION HEMATOMA BREAST;  Surgeon: [name], MD;  Location: [place];  Service: General;  Laterality: Left;  . JOINT REPLACEMENT    . KNEE ARTHROSCOPY Right 2000s  . LAPAROSCOPIC CHOLECYSTECTOMY  1995  . POLYPECTOMY  12/09/2015   Procedure: POLYPECTOMY;  Surgeon: [name], MD;  [facility];  Service: Endoscopy;;  multiple  . TOTAL KNEE ARTHROPLASTY Right 2011    Family Psychiatric History:  father- anxiety, depression, paternal grandmother- depression,  sister- depression, anxiety  Family History:  Family History  Problem Relation Age of Onset  . Anxiety disorder Father   . Depression Father   . Other Father     Abestosis  . Depression Sister   . Diabetes Mother     Social History:  Social History   Social History  . Marital status: Divorced    Spouse name:  N/A  . Number of children: N/A  . Years of education: N/A   Social History Main Topics  . Smoking status: Never Smoker  . Smokeless tobacco: Never Used  . Alcohol use Yes     Comment: 11/02/2015 "maybe 4 drinks/year"  . Drug use: Yes    Types: Marijuana     Comment: 11/02/2015 "drug use in my past; now maybe 2 puffs 3-4 times/year; for social anxiety"  . Sexual activity: Not Currently   Other Topics Concern  . None   Social History Narrative  . None   On disability for chronic pain secondary to arthritis,  depression, anxiety She used to work at [employer] until 2011 when she was admitted Education: graduated from [employer], college, no degrees,  Divorced in 1997, has one daughter She lives with her mother  Allergies:  Allergies  Allergen Reactions  . Adhesive [Tape] Rash  . Sulfa Antibiotics Nausea And Vomiting    Metabolic Disorder Labs: No results found for: HGBA1C, MPG No results found for: PROLACTIN No results found for: CHOL, TRIG, HDL, CHOLHDL, VLDL, LDLCALC   Current Medications: Current Outpatient Prescriptions  Medication Sig Dispense Refill  . acetaminophen (TYLENOL) 500 MG tablet Take 1,000 mg by mouth every 6 (six) hours as needed for moderate pain or headache.    Marland Kitchen atorvastatin (LIPITOR) 10 MG tablet Take 10 mg by mouth daily.    . divalproex (DEPAKOTE ER) 250 MG 24 hr tablet Take 3 tablets (750 mg total) by mouth at bedtime. 90 tablet 0  . docusate sodium (COLACE) 100 MG capsule Take 1 capsule (100 mg total) by mouth 2 (two) times daily. 10 capsule 0  . DULoxetine (CYMBALTA) 60 MG capsule Take 1 capsule (60 mg total) by mouth daily. 30 capsule 0  . hyaluronate sodium (RADIAPLEXRX) GEL Apply 1 application topically 2 (two) times daily.    Marland Kitchen lisinopril-hydrochlorothiazide (PRINZIDE,ZESTORETIC) 20-12.5 MG tablet Take 1 tablet by mouth daily.  6  . metFORMIN (GLUCOPHAGE) 500 MG tablet Take 500 mg by mouth 2 (two) times daily with a meal.     . non-metallic deodorant (ALRA) MISC Apply 1 application topically daily as needed.    . psyllium (METAMUCIL SMOOTH TEXTURE) 28 % packet Take 1 packet by mouth at bedtime.     No current facility-administered medications for this visit.     Neurologic: Headache: No Seizure: No Paresthesias: No  Musculoskeletal: Strength & Muscle Tone: within normal limits Gait & Station: normal Patient leans: N/A  Psychiatric Specialty Exam: Review of Systems  Neurological: Positive for tingling.  Psychiatric/Behavioral: Positive  for depression and suicidal ideas. Negative for hallucinations and substance abuse. The patient is nervous/anxious and has insomnia.   All other systems reviewed and are negative.   Blood pressure 129/88, pulse 93, height 5\' 7"  (1.702 m), weight 267 lb (121.1 kg).Body mass index is 41.82 kg/m.  General Appearance: Fairly Groomed  Eye Contact:  Good  Speech:  Clear and Coherent  Volume:  Normal  Mood:  Depressed  Affect:  Tearful and angry  Thought Process:  Coherent and Goal Directed  Orientation:  Full (Time, Place, and Person)  Thought Content: Logical  Perceptions: denies AH/VH  Suicidal Thoughts:  No  Homicidal Thoughts:  No  Memory:  Immediate;   Good Recent;   Good Remote;   Good  Judgement:  Good  Insight:  Fair  Psychomotor Activity:  Increased  Concentration:  Concentration: Good and Attention Span: Good  Recall:  Good  Fund  of Knowledge: Good  Language: Good  Akathisia:  No  Handed:  Right  AIMS (if indicated):  N/A  Assets:  Communication Skills Desire for Improvement  ADL's:  Intact  Cognition: WNL  Sleep:  fair   Assessment Lindsay Pacheco is a 56 year old female with depression, diabetes, hypertension, left breast cancer, irritable bowel syndrome, who presented for follow up appointment.   # MDD # r/o borderline personality disorder Although today's exam is notable for her irritability and increased psychomotor activity (becoming upset about clinic rules), she was redirectable. Will increase Depakote to target emotional dysregulation.  Discussed risk of tremors, elevated LFT. Will obtain labs. It is noted that she does have cluster B traits which are characterized by emotional dysregulation, low self-esteem, and sense of rejection. Although she would greatly benefit from DBT, she is unable to do so due to her insurance issues. Will provide psycho education as needed.   Plan - Increase Depakote 750 mg at night . Check labs (VPA, CMP, CBC, TSH) one week after  increase the dose.  - Continue duloxetine 60 mg daily -  Return to clinic in one month  Treatment Plan Summary: Plan as above  The patient demonstrates the following risk factors for suicide: Chronic risk factors for suicide include: psychiatric disorder of depression, substance use disorder, previous suicide attempts overdosing ativan and chronic pain. Acute risk factors for suicide include: family or marital conflict, unemployment, social withdrawal/isolation and loss (financial, interpersonal, professional). Protective factors for this patient include: coping skills and hope for the future. Considering these factors, the overall suicide risk at this point appears to be low. Patient is appropriate for outpatient follow up.  Norman Clay, MD 01/26/2016, 4:31 PM

## 2016-01-25 ENCOUNTER — Ambulatory Visit: Payer: Medicare Other

## 2016-01-25 ENCOUNTER — Encounter: Payer: Self-pay | Admitting: Radiation Oncology

## 2016-01-25 ENCOUNTER — Ambulatory Visit
Admission: RE | Admit: 2016-01-25 | Discharge: 2016-01-25 | Disposition: A | Discharge status: Home / Self Care | Payer: Medicare Other | Source: Ambulatory Visit | Attending: Radiation Oncology | Admitting: Radiation Oncology

## 2016-01-25 VITALS — BP 124/88 | HR 98 | Temp 98.0°F | Resp 20 | Wt 267.4 lb

## 2016-01-25 DIAGNOSIS — C50212 Malignant neoplasm of upper-inner quadrant of left female breast: Principal | ICD-10-CM

## 2016-01-25 DIAGNOSIS — C50211 Malignant neoplasm of upper-inner quadrant of right female breast: Secondary | ICD-10-CM

## 2016-01-25 DIAGNOSIS — Z510 Encounter for antineoplastic radiation therapy: Secondary | ICD-10-CM | POA: Diagnosis not present

## 2016-01-25 NOTE — Progress Notes (Signed)
Department of Radiation Oncology  Phone:  [phone] Fax:        [phone]  Weekly Treatment Note    Name: Lindsay Pacheco Date: 01/25/2016 MRN: [id number] DOB: 1960/04/08   Diagnosis:   No diagnosis found.   Current dose: 34.2 Gy  Current fraction: 19   MEDICATIONS: Current Outpatient Prescriptions  Medication Sig Dispense Refill  . acetaminophen (TYLENOL) 500 MG tablet Take 1,000 mg by mouth every 6 (six) hours as needed for moderate pain or headache.    Marland Kitchen atorvastatin (LIPITOR) 10 MG tablet Take 10 mg by mouth daily.    . divalproex (DEPAKOTE ER) 500 MG 24 hr tablet Take 1 tablet (500 mg total) by mouth at bedtime. 30 tablet 1  . docusate sodium (COLACE) 100 MG capsule Take 1 capsule (100 mg total) by mouth 2 (two) times daily. 10 capsule 0  . DULoxetine (CYMBALTA) 60 MG capsule Take 1 capsule (60 mg total) by mouth daily. 30 capsule 1  . hyaluronate sodium (RADIAPLEXRX) GEL Apply 1 application topically 2 (two) times daily.    Marland Kitchen lisinopril-hydrochlorothiazide (PRINZIDE,ZESTORETIC) 20-12.5 MG tablet Take 1 tablet by mouth daily.  6  . LORazepam (ATIVAN) 0.5 MG tablet Take 0.5 mg by mouth daily as needed for anxiety.    . metFORMIN (GLUCOPHAGE) 500 MG tablet Take 500 mg by mouth 2 (two) times daily with a meal.     . non-metallic deodorant (ALRA) MISC Apply 1 application topically daily as needed.    . psyllium (METAMUCIL SMOOTH TEXTURE) 28 % packet Take 1 packet by mouth at bedtime.     No current facility-administered medications for this encounter.      ALLERGIES: Adhesive [tape] and Sulfa antibiotics   LABORATORY DATA:  Lab Results  Component Value Date   WBC 11.5 (H) 11/02/2015   HGB 9.9 (L) 11/02/2015   HCT 30.5 (L) 11/02/2015   MCV 83.3 11/02/2015   PLT 252 11/02/2015   Lab Results  Component Value Date   NA 137 10/28/2015   K 3.6 10/28/2015   CL 103 10/28/2015   CO2 23 10/28/2015   Lab Results  Component Value Date   ALT 22 10/06/2009   AST 22 10/06/2009   ALKPHOS 120 (H) 10/06/2009   BILITOT 0.6 10/06/2009     NARRATIVE: Lindsay Pacheco was seen today for weekly treatment management. The chart was checked and the patient's films were reviewed.  The patient has completed 19/33 radiation treatments. She reports ongoing sharp pains in her breast, and she uses Radiaplex bid as directed. She reports she missed treatment last week due to a stomach virus. She reports a good appetite at this time.  11:05 AM BP 124/88 (BP Location: Right Arm, Patient Position: Sitting, Cuff Size: Large)   Pulse 98   Temp 98 F (36.7 C) (Oral)   Resp 20   Wt 267 lb 6.4 oz (121.3 kg)   BMI 41.88 kg/m   Wt Readings from Last 3 Encounters:  01/25/16 267 lb 6.4 oz (121.3 kg)  01/08/16 267 lb (121.1 kg)  01/01/16 267 lb 3.2 oz (121.2 kg)    PHYSICAL EXAMINATION: weight is 267 lb 6.4 oz (121.3 kg). Her oral temperature is 98 F (36.7 C). Her blood pressure is 124/88 and her pulse is 98. Her respiration is 20.  Diffuse mild erythema in the treatment area. No desquamation.  ASSESSMENT: The patient is doing satisfactorily with treatment.  PLAN: We will continue with the patient's radiation treatment as planned. I  encouraged the patient to use Hydrocortisone cream in the treatment area as needed for itching. Because of the patient's missed treatments, I will inquire about the potential for the patient to complete two treatments a day to catch up.  ------------------------------------------------  [name], MD, PhD  This document serves as a record of services personally performed by Kyung Rudd, MD. It was created on his behalf by Maryla Morrow, a trained medical scribe. The creation of this record is based on the scribe's personal observations and the provider's statements to them. This document has been checked and approved by the attending provider.

## 2016-01-25 NOTE — Progress Notes (Signed)
Weekly rad txs left breast 19/33 completed, mild erythema only,  Still has occasional sharp pains in breast, uses radiaplex bid, missed last week due to stomach virus, appetite good BP 124/88 (BP Location: Right Arm, Patient Position: Sitting, Cuff Size: Large)   Pulse 98   Temp 98 F (36.7 C) (Oral)   Resp 20   Wt 267 lb 6.4 oz (121.3 kg)   BMI 41.88 kg/m   Wt Readings from Last 3 Encounters:  01/25/16 267 lb 6.4 oz (121.3 kg)  01/08/16 267 lb (121.1 kg)  01/01/16 267 lb 3.2 oz (121.2 kg)

## 2016-01-26 ENCOUNTER — Ambulatory Visit
Admission: RE | Admit: 2016-01-26 | Discharge: 2016-01-26 | Disposition: A | Discharge status: Home / Self Care | Payer: Medicare Other | Source: Ambulatory Visit | Attending: Radiation Oncology | Admitting: Radiation Oncology

## 2016-01-26 ENCOUNTER — Ambulatory Visit (INDEPENDENT_AMBULATORY_CARE_PROVIDER_SITE_OTHER): Payer: Medicare Other | Admitting: Psychiatry

## 2016-01-26 ENCOUNTER — Telehealth (HOSPITAL_COMMUNITY): Payer: Self-pay | Admitting: *Deleted

## 2016-01-26 ENCOUNTER — Encounter (HOSPITAL_COMMUNITY): Payer: Self-pay | Admitting: Psychiatry

## 2016-01-26 DIAGNOSIS — F331 Major depressive disorder, recurrent, moderate: Secondary | ICD-10-CM | POA: Diagnosis not present

## 2016-01-26 DIAGNOSIS — Z833 Family history of diabetes mellitus: Secondary | ICD-10-CM | POA: Diagnosis not present

## 2016-01-26 DIAGNOSIS — Z818 Family history of other mental and behavioral disorders: Secondary | ICD-10-CM

## 2016-01-26 DIAGNOSIS — Z9889 Other specified postprocedural states: Secondary | ICD-10-CM | POA: Diagnosis not present

## 2016-01-26 DIAGNOSIS — Z79899 Other long term (current) drug therapy: Secondary | ICD-10-CM

## 2016-01-26 DIAGNOSIS — Z9109 Other allergy status, other than to drugs and biological substances: Secondary | ICD-10-CM

## 2016-01-26 DIAGNOSIS — Z882 Allergy status to sulfonamides status: Secondary | ICD-10-CM

## 2016-01-26 DIAGNOSIS — Z510 Encounter for antineoplastic radiation therapy: Secondary | ICD-10-CM | POA: Diagnosis not present

## 2016-01-26 MED ORDER — DIVALPROEX SODIUM ER 250 MG PO TB24: 750.0000 mg | ORAL_TABLET | Freq: Every day | ORAL | 0 refills | Status: DC

## 2016-01-26 MED ORDER — DULOXETINE HCL 60 MG PO CPEP: 60.0000 mg | ORAL_CAPSULE | Freq: Every day | ORAL | 0 refills | Status: DC

## 2016-01-26 NOTE — Patient Instructions (Addendum)
-   Increase Depakote 750 mg at night - Continue duloxetine 60 mg daily - Obtain blood level one week after increasing Depakote -  Return to clinic in one month

## 2016-01-27 ENCOUNTER — Ambulatory Visit
Admission: RE | Admit: 2016-01-27 | Discharge: 2016-01-27 | Disposition: A | Discharge status: Home / Self Care | Payer: Medicare Other | Source: Ambulatory Visit | Attending: Radiation Oncology | Admitting: Radiation Oncology

## 2016-01-27 DIAGNOSIS — Z510 Encounter for antineoplastic radiation therapy: Secondary | ICD-10-CM | POA: Diagnosis not present

## 2016-01-27 NOTE — Telephone Encounter (Signed)
After reviewing with PA-C, notified patient she could stop PT visits. Patient stated she was going to have to stop because she cannot afford the co pay. She states if she has to go back she will but doesn't think she needs it at this time. She can perform the exercises at home.

## 2016-01-28 ENCOUNTER — Ambulatory Visit
Admission: RE | Admit: 2016-01-28 | Discharge: 2016-01-28 | Disposition: A | Discharge status: Home / Self Care | Payer: Medicare Other | Source: Ambulatory Visit | Attending: Radiation Oncology | Admitting: Radiation Oncology

## 2016-01-28 DIAGNOSIS — Z510 Encounter for antineoplastic radiation therapy: Secondary | ICD-10-CM | POA: Diagnosis not present

## 2016-01-29 ENCOUNTER — Ambulatory Visit: Payer: Medicare Other | Admitting: Physical Therapy

## 2016-01-29 ENCOUNTER — Ambulatory Visit
Admission: RE | Admit: 2016-01-29 | Discharge: 2016-01-29 | Disposition: A | Discharge status: Home / Self Care | Payer: Medicare Other | Source: Ambulatory Visit | Attending: Radiation Oncology | Admitting: Radiation Oncology

## 2016-01-29 ENCOUNTER — Encounter: Payer: Self-pay | Admitting: Radiation Oncology

## 2016-01-29 VITALS — BP 104/82 | HR 88 | Temp 97.8°F | Resp 16 | Wt 265.2 lb

## 2016-01-29 DIAGNOSIS — C50212 Malignant neoplasm of upper-inner quadrant of left female breast: Principal | ICD-10-CM

## 2016-01-29 DIAGNOSIS — Z510 Encounter for antineoplastic radiation therapy: Secondary | ICD-10-CM | POA: Diagnosis not present

## 2016-01-29 DIAGNOSIS — C50211 Malignant neoplasm of upper-inner quadrant of right female breast: Secondary | ICD-10-CM

## 2016-01-29 NOTE — Progress Notes (Signed)
   Department of Radiation Oncology  Phone:  [phone] Fax:        [phone]  Weekly Treatment Note    Name: Lindsay Pacheco Date: 01/29/2016 MRN: [id number] DOB: Nov 12, 1960   Diagnosis:     ICD-9-CM ICD-10-CM   1. Bilateral malignant neoplasm of upper inner quadrant of breast in female, unspecified estrogen receptor status ([place]) 174.2 C50.211     C50.212      Current dose: 41.4 Gy  Current fraction: 23   MEDICATIONS: Current Outpatient Prescriptions  Medication Sig Dispense Refill  . acetaminophen (TYLENOL) 500 MG tablet Take 1,000 mg by mouth every 6 (six) hours as needed for moderate pain or headache.    Marland Kitchen atorvastatin (LIPITOR) 10 MG tablet Take 10 mg by mouth daily.    . divalproex (DEPAKOTE ER) 250 MG 24 hr tablet Take 3 tablets (750 mg total) by mouth at bedtime. 90 tablet 0  . docusate sodium (COLACE) 100 MG capsule Take 1 capsule (100 mg total) by mouth 2 (two) times daily. 10 capsule 0  . DULoxetine (CYMBALTA) 60 MG capsule Take 1 capsule (60 mg total) by mouth daily. 30 capsule 0  . hyaluronate sodium (RADIAPLEXRX) GEL Apply 1 application topically 2 (two) times daily.    Marland Kitchen lisinopril-hydrochlorothiazide (PRINZIDE,ZESTORETIC) 20-12.5 MG tablet Take 1 tablet by mouth daily.  6  . metFORMIN (GLUCOPHAGE) 500 MG tablet Take 500 mg by mouth 2 (two) times daily with a meal.     . non-metallic deodorant (ALRA) MISC Apply 1 application topically daily as needed.    . psyllium (METAMUCIL SMOOTH TEXTURE) 28 % packet Take 1 packet by mouth at bedtime.     No current facility-administered medications for this encounter.      ALLERGIES: Adhesive [tape] and Sulfa antibiotics   LABORATORY DATA:  Lab Results  Component Value Date   WBC 11.5 (H) 11/02/2015   HGB 9.9 (L) 11/02/2015   HCT 30.5 (L) 11/02/2015   MCV 83.3 11/02/2015   PLT 252 11/02/2015   Lab Results  Component Value Date   NA 137 10/28/2015   K 3.6 10/28/2015   CL 103 10/28/2015   CO2 23  10/28/2015   Lab Results  Component Value Date   ALT 22 10/06/2009   AST 22 10/06/2009   ALKPHOS 120 (H) 10/06/2009   BILITOT 0.6 10/06/2009     NARRATIVE: Lindsay Pacheco was seen today for weekly treatment management. The chart was checked and the patient's films were reviewed.  The patient states she is doing well. Experiencing some itching in the upper inner aspect of the treatment area. She is using hydrocortisone cream on this area  PHYSICAL EXAMINATION: weight is 265 lb 3.2 oz (120.3 kg). Her oral temperature is 97.8 F (36.6 C). Her blood pressure is 104/82 and her pulse is 88. Her respiration is 16.    Skin looks good for where she is in her treatment. Mild hyperpigmentation with some dermatitis present.    ASSESSMENT: The patient is doing satisfactorily with treatment.  PLAN: We will continue with the patient's radiation treatment as planned.

## 2016-01-29 NOTE — Progress Notes (Signed)
Weekly rad txs left breast , 23/33 completed, erythema skin intact, using radiaplex bid, will come back this afternoon for another rad tx,  Fatigued this am BP 104/82 (BP Location: Right Arm, Patient Position: Sitting, Cuff Size: Large)   Pulse 88   Temp 97.8 F (36.6 C) (Oral)   Resp 16   Wt 265 lb 3.2 oz (120.3 kg)   BMI 41.54 kg/m   Wt Readings from Last 3 Encounters:  01/29/16 265 lb 3.2 oz (120.3 kg)  01/25/16 267 lb 6.4 oz (121.3 kg)  01/08/16 267 lb (121.1 kg)

## 2016-01-29 NOTE — Progress Notes (Signed)
Complex simulation note  Diagnosis: Left-sided breast cancer  Narrative The patient has initially been planned to receive a course of whole breast radiation to a dose of 50.4 Gy in 28 fractions. The patient will now receive an additional boost to the seroma cavity which has been contoured. This will correspond to a boost of 10 Gy at 2 Gy per fraction. To accomplish this, an additional 3 customized blocks have been designed for this purpose. A complex isodose plan is requested to ensure that the target area is adequately covered with radiation dose and that the nearby normal structures such as the lung are adequately spared. The patient's final total dose will be 6040 Gy.  ------------------------------------------------  Lindsay Gross, MD, PhD

## 2016-01-30 ENCOUNTER — Encounter (HOSPITAL_COMMUNITY): Payer: Self-pay | Admitting: Hematology & Oncology

## 2016-02-01 ENCOUNTER — Ambulatory Visit: Payer: Medicare Other

## 2016-02-01 ENCOUNTER — Ambulatory Visit
Admission: RE | Admit: 2016-02-01 | Discharge: 2016-02-01 | Disposition: A | Discharge status: Home / Self Care | Payer: Medicare Other | Source: Ambulatory Visit | Attending: Radiation Oncology | Admitting: Radiation Oncology

## 2016-02-01 DIAGNOSIS — Z510 Encounter for antineoplastic radiation therapy: Secondary | ICD-10-CM | POA: Diagnosis not present

## 2016-02-02 ENCOUNTER — Ambulatory Visit: Payer: Medicare Other

## 2016-02-02 ENCOUNTER — Ambulatory Visit
Admission: RE | Admit: 2016-02-02 | Discharge: 2016-02-02 | Disposition: A | Discharge status: Home / Self Care | Payer: Medicare Other | Source: Ambulatory Visit | Attending: Radiation Oncology | Admitting: Radiation Oncology

## 2016-02-02 DIAGNOSIS — Z510 Encounter for antineoplastic radiation therapy: Secondary | ICD-10-CM | POA: Diagnosis not present

## 2016-02-03 ENCOUNTER — Ambulatory Visit
Admission: RE | Admit: 2016-02-03 | Discharge: 2016-02-03 | Disposition: A | Discharge status: Home / Self Care | Payer: Medicare Other | Source: Ambulatory Visit | Attending: Radiation Oncology | Admitting: Radiation Oncology

## 2016-02-03 ENCOUNTER — Ambulatory Visit: Payer: Medicare Other

## 2016-02-03 DIAGNOSIS — Z510 Encounter for antineoplastic radiation therapy: Secondary | ICD-10-CM | POA: Diagnosis not present

## 2016-02-04 ENCOUNTER — Ambulatory Visit
Admission: RE | Admit: 2016-02-04 | Discharge: 2016-02-04 | Disposition: A | Discharge status: Home / Self Care | Payer: Medicare Other | Source: Ambulatory Visit | Attending: Radiation Oncology | Admitting: Radiation Oncology

## 2016-02-04 ENCOUNTER — Ambulatory Visit: Payer: Medicare Other

## 2016-02-04 DIAGNOSIS — Z510 Encounter for antineoplastic radiation therapy: Secondary | ICD-10-CM | POA: Diagnosis not present

## 2016-02-05 ENCOUNTER — Ambulatory Visit: Payer: Medicare Other

## 2016-02-05 ENCOUNTER — Ambulatory Visit
Admission: RE | Admit: 2016-02-05 | Discharge: 2016-02-05 | Disposition: A | Discharge status: Home / Self Care | Payer: Medicare Other | Source: Ambulatory Visit | Attending: Radiation Oncology | Admitting: Radiation Oncology

## 2016-02-05 ENCOUNTER — Ambulatory Visit (HOSPITAL_COMMUNITY): Payer: Self-pay | Admitting: Adult Health

## 2016-02-05 ENCOUNTER — Encounter: Payer: Self-pay | Admitting: Radiation Oncology

## 2016-02-05 VITALS — BP 114/86 | HR 92 | Temp 97.8°F | Resp 20 | Wt 269.4 lb

## 2016-02-05 DIAGNOSIS — Z510 Encounter for antineoplastic radiation therapy: Secondary | ICD-10-CM | POA: Diagnosis not present

## 2016-02-05 DIAGNOSIS — C50212 Malignant neoplasm of upper-inner quadrant of left female breast: Principal | ICD-10-CM

## 2016-02-05 DIAGNOSIS — C50211 Malignant neoplasm of upper-inner quadrant of right female breast: Secondary | ICD-10-CM

## 2016-02-05 NOTE — Progress Notes (Addendum)
Weekly rad txs left breast, erythema,  Rash like erythema skin intact, itches m=mild, radiaplex bid, no c/o pain, appetite good, mild fatigue 10:45 AM BP 114/86 (BP Location: Right Arm, Patient Position: Sitting, Cuff Size: Large)   Pulse 92   Temp 97.8 F (36.6 C) (Oral)   Resp 20   Wt 269 lb 6.4 oz (122.2 kg)   BMI 42.19 kg/m   Wt Readings from Last 3 Encounters:  02/05/16 269 lb 6.4 oz (122.2 kg)  01/29/16 265 lb 3.2 oz (120.3 kg)  01/25/16 267 lb 6.4 oz (121.3 kg)

## 2016-02-05 NOTE — Progress Notes (Signed)
Department of Radiation Oncology  Phone:  [phone] Fax:        [phone]  Weekly Treatment Note    Name: Lindsay Pacheco Date: 02/05/2016 MRN: [id number] DOB: 06/15/1960   Diagnosis:     ICD-9-CM ICD-10-CM   1. Bilateral malignant neoplasm of upper inner quadrant of breast in female, unspecified estrogen receptor status ([place]) 174.2 C50.211     C50.212      Current dose: 52.4 Gy  Current fraction: 29   MEDICATIONS: Current Outpatient Prescriptions  Medication Sig Dispense Refill  . acetaminophen (TYLENOL) 500 MG tablet Take 1,000 mg by mouth every 6 (six) hours as needed for moderate pain or headache.    Marland Kitchen atorvastatin (LIPITOR) 10 MG tablet Take 10 mg by mouth daily.    . divalproex (DEPAKOTE ER) 250 MG 24 hr tablet Take 3 tablets (750 mg total) by mouth at bedtime. 90 tablet 0  . docusate sodium (COLACE) 100 MG capsule Take 1 capsule (100 mg total) by mouth 2 (two) times daily. 10 capsule 0  . DULoxetine (CYMBALTA) 60 MG capsule Take 1 capsule (60 mg total) by mouth daily. 30 capsule 0  . hyaluronate sodium (RADIAPLEXRX) GEL Apply 1 application topically 2 (two) times daily.    Marland Kitchen lisinopril-hydrochlorothiazide (PRINZIDE,ZESTORETIC) 20-12.5 MG tablet Take 1 tablet by mouth daily.  6  . metFORMIN (GLUCOPHAGE) 500 MG tablet Take 500 mg by mouth 2 (two) times daily with a meal.     . non-metallic deodorant (ALRA) MISC Apply 1 application topically daily as needed.    . psyllium (METAMUCIL SMOOTH TEXTURE) 28 % packet Take 1 packet by mouth at bedtime.     No current facility-administered medications for this encounter.      ALLERGIES: Adhesive [tape] and Sulfa antibiotics   LABORATORY DATA:  Lab Results  Component Value Date   WBC 11.5 (H) 11/02/2015   HGB 9.9 (L) 11/02/2015   HCT 30.5 (L) 11/02/2015   MCV 83.3 11/02/2015   PLT 252 11/02/2015   Lab Results  Component Value Date   NA 137 10/28/2015   K 3.6 10/28/2015   CL 103 10/28/2015   CO2 23 10/28/2015    Lab Results  Component Value Date   ALT 22 10/06/2009   AST 22 10/06/2009   ALKPHOS 120 (H) 10/06/2009   BILITOT 0.6 10/06/2009     NARRATIVE: Lindsay Pacheco was seen today for weekly treatment management. The chart was checked and the patient's films were reviewed.  The patient is doing very well. Some ongoing skin irritation. She began her boost treatment today.  Weekly rad txs left breast, erythema,  Rash like erythema skin intact, itches m=mild, radiaplex bid, no c/o pain, appetite good, mild fatigue 11:03 AM BP 114/86 (BP Location: Right Arm, Patient Position: Sitting, Cuff Size: Large)   Pulse 92   Temp 97.8 F (36.6 C) (Oral)   Resp 20   Wt 269 lb 6.4 oz (122.2 kg)   BMI 42.19 kg/m   Wt Readings from Last 3 Encounters:  02/05/16 269 lb 6.4 oz (122.2 kg)  01/29/16 265 lb 3.2 oz (120.3 kg)  01/25/16 267 lb 6.4 oz (121.3 kg)    PHYSICAL EXAMINATION: weight is 269 lb 6.4 oz (122.2 kg). Her oral temperature is 97.8 F (36.6 C). Her blood pressure is 114/86 and her pulse is 92. Her respiration is 20.      Mild hyperpigmentation/erythema in the treatment area. Overall her skin looks excellent for where she is in  her treatment.  ASSESSMENT: The patient is doing satisfactorily with treatment.  PLAN: We will continue with the patient's radiation treatment as planned.

## 2016-02-08 ENCOUNTER — Ambulatory Visit
Admission: RE | Admit: 2016-02-08 | Discharge: 2016-02-08 | Disposition: A | Discharge status: Home / Self Care | Payer: Medicare Other | Source: Ambulatory Visit | Attending: Radiation Oncology | Admitting: Radiation Oncology

## 2016-02-08 ENCOUNTER — Ambulatory Visit: Payer: Medicare Other

## 2016-02-08 DIAGNOSIS — Z510 Encounter for antineoplastic radiation therapy: Secondary | ICD-10-CM | POA: Diagnosis not present

## 2016-02-09 ENCOUNTER — Ambulatory Visit
Admission: RE | Admit: 2016-02-09 | Discharge: 2016-02-09 | Disposition: A | Discharge status: Home / Self Care | Payer: Medicare Other | Source: Ambulatory Visit | Attending: Radiation Oncology | Admitting: Radiation Oncology

## 2016-02-09 ENCOUNTER — Ambulatory Visit: Payer: Medicare Other

## 2016-02-09 DIAGNOSIS — Z510 Encounter for antineoplastic radiation therapy: Secondary | ICD-10-CM | POA: Diagnosis not present

## 2016-02-10 ENCOUNTER — Ambulatory Visit
Admission: RE | Admit: 2016-02-10 | Discharge: 2016-02-10 | Disposition: A | Discharge status: Home / Self Care | Payer: Medicare Other | Source: Ambulatory Visit | Attending: Radiation Oncology | Admitting: Radiation Oncology

## 2016-02-10 ENCOUNTER — Ambulatory Visit: Payer: Medicare Other

## 2016-02-10 DIAGNOSIS — Z510 Encounter for antineoplastic radiation therapy: Secondary | ICD-10-CM | POA: Diagnosis not present

## 2016-02-11 ENCOUNTER — Ambulatory Visit: Payer: Medicare Other

## 2016-02-11 ENCOUNTER — Ambulatory Visit
Admission: RE | Admit: 2016-02-11 | Discharge: 2016-02-11 | Disposition: A | Discharge status: Home / Self Care | Payer: Medicare Other | Source: Ambulatory Visit | Attending: Radiation Oncology | Admitting: Radiation Oncology

## 2016-02-11 ENCOUNTER — Encounter: Payer: Self-pay | Admitting: Radiation Oncology

## 2016-02-11 VITALS — BP 121/81 | HR 101 | Temp 97.7°F | Resp 20 | Wt 267.0 lb

## 2016-02-11 DIAGNOSIS — C50212 Malignant neoplasm of upper-inner quadrant of left female breast: Principal | ICD-10-CM

## 2016-02-11 DIAGNOSIS — C50211 Malignant neoplasm of upper-inner quadrant of right female breast: Secondary | ICD-10-CM

## 2016-02-11 DIAGNOSIS — Z510 Encounter for antineoplastic radiation therapy: Secondary | ICD-10-CM | POA: Diagnosis not present

## 2016-02-11 NOTE — Progress Notes (Signed)
Department of Radiation Oncology  Phone:  [phone] Fax:        [phone]  Weekly Treatment Note    Name: Lindsay Pacheco Date: 02/11/2016 MRN: [id number] DOB: 11/20/1960   Diagnosis:     ICD-9-CM ICD-10-CM   1. Bilateral malignant neoplasm of upper inner quadrant of breast in female, unspecified estrogen receptor status ([place]) 174.2 C50.211     C50.212      Current dose: 60.4 Gy  Current fraction:33   MEDICATIONS: Current Outpatient Prescriptions  Medication Sig Dispense Refill  . acetaminophen (TYLENOL) 500 MG tablet Take 1,000 mg by mouth every 6 (six) hours as needed for moderate pain or headache.    Marland Kitchen atorvastatin (LIPITOR) 10 MG tablet Take 10 mg by mouth daily.    . divalproex (DEPAKOTE ER) 250 MG 24 hr tablet Take 3 tablets (750 mg total) by mouth at bedtime. 90 tablet 0  . docusate sodium (COLACE) 100 MG capsule Take 1 capsule (100 mg total) by mouth 2 (two) times daily. 10 capsule 0  . DULoxetine (CYMBALTA) 60 MG capsule Take 1 capsule (60 mg total) by mouth daily. 30 capsule 0  . hyaluronate sodium (RADIAPLEXRX) GEL Apply 1 application topically 2 (two) times daily.    Marland Kitchen lisinopril-hydrochlorothiazide (PRINZIDE,ZESTORETIC) 20-12.5 MG tablet Take 1 tablet by mouth daily.  6  . metFORMIN (GLUCOPHAGE) 500 MG tablet Take 500 mg by mouth 2 (two) times daily with a meal.     . non-metallic deodorant (ALRA) MISC Apply 1 application topically daily as needed.    . psyllium (METAMUCIL SMOOTH TEXTURE) 28 % packet Take 1 packet by mouth at bedtime.     No current facility-administered medications for this encounter.      ALLERGIES: Adhesive [tape] and Sulfa antibiotics   LABORATORY DATA:  Lab Results  Component Value Date   WBC 11.5 (H) 11/02/2015   HGB 9.9 (L) 11/02/2015   HCT 30.5 (L) 11/02/2015   MCV 83.3 11/02/2015   PLT 252 11/02/2015   Lab Results  Component Value Date   NA 137 10/28/2015   K 3.6 10/28/2015   CL 103 10/28/2015   CO2 23 10/28/2015     Lab Results  Component Value Date   ALT 22 10/06/2009   AST 22 10/06/2009   ALKPHOS 120 (H) 10/06/2009   BILITOT 0.6 10/06/2009     NARRATIVE: Lindsay Pacheco was seen today for weekly treatment management. The chart was checked and the patient's films were reviewed.  She returns for weekly radiation to the left breast, 33/33 completed. Reports dry desquamation under left axilla with erythema and slight peeling under inframammary fold. Appetite good. Using radiaplex two to three times daily.   PHYSICAL EXAMINATION: weight is 267 lb (121.1 kg). Her oral temperature is 97.7 F (36.5 C). Her blood pressure is 121/81 and her pulse is 101 (abnormal). Her respiration is 20.      Dry desquamation in the left axilla. No moist desquamation noted.  ASSESSMENT: The patient is doing satisfactorily with treatment.  PLAN: The patient has completed treatment and will return in 1 month for follow up with Shona Simpson, PA-C.   ------------------------------------------------  [name], MD, PhD  This document serves as a record of services personally performed by Kyung Rudd, MD. It was created on his behalf by Arlyce Harman, a trained medical scribe. The creation of this record is based on the scribe's personal observations and the provider's statements to them. This document has been checked and  approved by the attending provider.

## 2016-02-11 NOTE — Progress Notes (Signed)
Weekly radiation to left breast  33/33 completed, EOT, dry desquamation under left axilla, erythema rest of breast, slight peeling under inframmary fold, gave follow up appt with Shona Simpson, PA in March 2018, uses radiaplex 2-3xday, appetite good,  BP 121/81 (BP Location: Right Arm, Patient Position: Sitting, Cuff Size: Large)   Pulse (!) 101   Temp 97.7 F (36.5 C) (Oral)   Resp 20   Wt 267 lb (121.1 kg)   BMI 41.82 kg/m   Wt Readings from Last 3 Encounters:  02/11/16 267 lb (121.1 kg)  02/05/16 269 lb 6.4 oz (122.2 kg)  01/29/16 265 lb 3.2 oz (120.3 kg)

## 2016-02-11 NOTE — Progress Notes (Signed)
Radiation Oncology         [phone] ________________________________  Name: Lindsay Pacheco MRN: [id number]  Date: 02/11/2016  DOB: [birth year]  End of Treatment Note  Diagnosis:   Stage IIA (pT2, pN0) grade 1 invasive ductal carcinoma of the left breast (ER/PR+, HER2-)     Indication for treatment:  Curative       Radiation treatment dates:   12/22/2015 to 02/11/2016  Site/dose:    1. The Left breast was treated to 50.4 Gy in 28 fractions at 1.8 Gy per fraction. 2. The Left breast was boosted to 10 Gy in 5 fractions at 2 Gy per fraction.  Beams/energy:    1. 3D // 10X 2. Isodose plan // 10X, 6X  Narrative: The patient tolerated radiation treatment relatively well.  She developed dry desquamation in the left axilla with no moist desquamation.   Plan: The patient has completed radiation treatment. The patient will return to radiation oncology clinic for routine followup in one month. I advised them to call or return sooner if they have any questions or concerns related to their recovery or treatment.  ------------------------------------------------  [name], MD, PhD  This document serves as a record of services personally performed by Kyung Rudd, MD. It was created on his behalf by Arlyce Harman, a trained medical scribe. The creation of this record is based on the scribe's personal observations and the provider's statements to them. This document has been checked and approved by the attending provider.

## 2016-02-12 ENCOUNTER — Ambulatory Visit: Admission: RE | Admit: 2016-02-12 | Payer: Medicare Other | Source: Ambulatory Visit

## 2016-02-15 ENCOUNTER — Encounter (HOSPITAL_COMMUNITY): Payer: Medicare Other | Attending: Adult Health | Admitting: Adult Health

## 2016-02-15 VITALS — BP 117/70 | HR 88 | Temp 97.8°F | Resp 18 | Wt 270.8 lb

## 2016-02-15 DIAGNOSIS — Z170 Estrogen receptor positive status [ER+]: Secondary | ICD-10-CM | POA: Diagnosis not present

## 2016-02-15 DIAGNOSIS — C50212 Malignant neoplasm of upper-inner quadrant of left female breast: Secondary | ICD-10-CM

## 2016-02-15 DIAGNOSIS — Z79811 Long term (current) use of aromatase inhibitors: Secondary | ICD-10-CM

## 2016-02-15 DIAGNOSIS — I890 Lymphedema, not elsewhere classified: Secondary | ICD-10-CM

## 2016-02-15 DIAGNOSIS — Z79899 Other long term (current) drug therapy: Secondary | ICD-10-CM | POA: Insufficient documentation

## 2016-02-15 MED ORDER — LETROZOLE 2.5 MG PO TABS: 2.5000 mg | ORAL_TABLET | Freq: Every day | ORAL | 3 refills | Status: DC

## 2016-02-15 NOTE — Patient Instructions (Signed)
[place] at [facility] Discharge Instructions  RECOMMENDATIONS MADE BY THE CONSULTANT AND ANY TEST RESULTS WILL BE SENT TO YOUR REFERRING PHYSICIAN.  Exam with Mike Craze, NP today.   Please start taking Letrozole March 1,2018. Start taking Calcium 1200mg  and Vit. D 800iu daily.    Letrozole tablets What is this medicine? LETROZOLE (LET roe zole) blocks the production of estrogen. It is used to treat breast cancer. This medicine may be used for other purposes; ask your health care provider or pharmacist if you have questions. COMMON BRAND NAME(S): Femara What should I tell my health care provider before I take this medicine? They need to know if you have any of these conditions: -high cholesterol -liver disease -osteoporosis (weak bones) -an unusual or allergic reaction to letrozole, other medicines, foods, dyes, or preservatives -pregnant or trying to get pregnant -breast-feeding How should I use this medicine? Take this medicine by mouth with a glass of water. You may take it with or without food. Follow the directions on the prescription label. Take your medicine at regular intervals. Do not take your medicine more often than directed. Do not stop taking except on your doctor's advice. Talk to your pediatrician regarding the use of this medicine in children. Special care may be needed. Overdosage: If you think you have taken too much of this medicine contact a poison control center or emergency room at once. NOTE: This medicine is only for you. Do not share this medicine with others. What if I miss a dose? If you miss a dose, take it as soon as you can. If it is almost time for your next dose, take only that dose. Do not take double or extra doses. What may interact with this medicine? Do not take this medicine with any of the following medications: -estrogens, like hormone replacement therapy or birth control pills This medicine may also interact  with the following medications: -dietary supplements such as androstenedione or DHEA -prasterone -tamoxifen This list may not describe all possible interactions. Give your health care provider a list of all the medicines, herbs, non-prescription drugs, or dietary supplements you use. Also tell them if you smoke, drink alcohol, or use illegal drugs. Some items may interact with your medicine. What should I watch for while using this medicine? Tell your doctor or healthcare professional if your symptoms do not start to get better or if they get worse. Do not become pregnant while taking this medicine or for 3 weeks after stopping it. Women should inform their doctor if they wish to become pregnant or think they might be pregnant. There is a potential for serious side effects to an unborn child. Talk to your health care professional or pharmacist for more information. Do not breast-feed while taking this medicine or for 3 weeks after stopping it. This medicine may interfere with the ability to have a child. Talk with your doctor or health care professional if you are concerned about your fertility. Using this medicine for a long time may increase your risk of low bone mass. Talk to your doctor about bone health. You may get drowsy or dizzy. Do not drive, use machinery, or do anything that needs mental alertness until you know how this medicine affects you. Do not stand or sit up quickly, especially if you are an older patient. This reduces the risk of dizzy or fainting spells. You may need blood work done while you are taking this medicine. What side effects may I notice  from receiving this medicine? Per Elzie Rings: Side effects incude: vaginal dryness, hot flashes, joint aches and pain, decreased bone density. Side effects that you should report to your doctor or health care professional as soon as possible: -allergic reactions like skin rash, itching, or hives -bone fracture -chest pain -signs and  symptoms of a blood clot such as breathing problems; changes in vision; chest pain; severe, sudden headache; pain, swelling, warmth in the leg; trouble speaking; sudden numbness or weakness of the face, arm or leg -vaginal bleeding Side effects that usually do not require medical attention (report to your doctor or health care professional if they continue or are bothersome): -bone, back, joint, or muscle pain -dizziness -fatigue -fluid retention -headache -hot flashes, night sweats -nausea -weight gain This list may not describe all possible side effects. Call your doctor for medical advice about side effects. You may report side effects to FDA at 1-800-FDA-1088. Where should I keep my medicine? Keep out of the reach of children. Store between 15 and 30 degrees C (59 and 86 degrees F). Throw away any unused medicine after the expiration date. NOTE: This sheet is a summary. It may not cover all possible information. If you have questions about this medicine, talk to your doctor, pharmacist, or health care provider.  2017 Elsevier/Gold Standard (2015-07-27 11:10:41)   Thank you for choosing [place] at [facility] to provide your oncology and hematology care.  To afford each patient quality time with our provider, please arrive at least 15 minutes before your scheduled appointment time.    If you have a lab appointment with the [facility] please come in thru the  Main Entrance and check in at the main information desk  You need to re-schedule your appointment should you arrive 10 or more minutes late.  We strive to give you quality time with our providers, and arriving late affects you and other patients whose appointments are after yours.  Also, if you no show three or more times for appointments you may be dismissed from the clinic at the providers discretion.     Again, thank you for choosing [facility].  Our hope is that these requests will  decrease the amount of time that you wait before being seen by our physicians.       _____________________________________________________________  Should you have questions after your visit to [facility], please contact our office at [phone] between the hours of 8:30 a.m. and 4:30 p.m.  Voicemails left after 4:30 p.m. will not be returned until the following business day.  For prescription refill requests, have your pharmacy contact our office.       Resources For Cancer Patients and their Caregivers ? American Cancer Society: Can assist with transportation, wigs, general needs, runs Look Good Feel Better.        210 339 2679 ? Cancer Care: Provides financial assistance, online support groups, medication/co-pay assistance.  1-800-813-HOPE (660)217-0678) ? [place] Assists [place] cancer patients and their families through emotional , educational and financial support.  8168272366 ? [place] DSS Where to apply for food stamps, Medicaid and utility assistance. 580-333-3901 ? [facility]: Transportation to medical appointments. 504-042-4786 ? Social Security Administration: May apply for disability if have a Stage IV cancer. 671-201-3328 669-208-2495 ? [employer], Disability and Transit Services: Assists with nutrition, care and transit needs. [place] Support Programs: @10RELATIVEDAYS @ > Cancer Support Group  2nd Tuesday of the  month 1pm-2pm, Journey Room  > Creative Journey  3rd Tuesday of the month 1130am-1pm, Journey Room  > Look Good Feel Better  1st Wednesday of the month 10am-12 noon, Journey Room (Call Alexander to register [phone])

## 2016-02-15 NOTE — Progress Notes (Signed)
Lindsay Pacheco, Lindsay Pacheco [id number]   CLINIC:  Medical Oncology/Hematology  PCP:  Lindsay Blitz, MD [place] [address] [phone]   REASON FOR VISIT:  Follow-up for Stage IIA invasive ductal carcinoma of left breast; ER+/PR+/HER2-  CURRENT THERAPY: s/p adjuvant radiation therapy; Pending anti-estrogen therapy    BRIEF ONCOLOGIC HISTORY:    Malignant neoplasm of upper inner quadrant of female breast ([place])   09/23/2015 Mammogram    Digital diagnostic mammogram and Ultrasound. Area of shadowing L breast on U/S at 12:00 1 cm from nipple corresponds to distortion on mammography. 3 adjacent masses in L breast at 11:00 could represent benign cysts, indeterminate. These are in the UI quadrant of L breast      09/30/2015 Initial Biopsy    Ultrasound guided biopsy at 12 o clock and 11 o clock position      09/30/2015 Pathology Results    12 o clock position invasive ductal carcinoma low grade, localized DCIS, cribriform type with calcifications. 11 o clock fibrotic and inflamed breast tissue DCIS  ER> 90%, PR > 90%, HER 2 IHC 2+. FISH negative      10/14/2015 Imaging    MRI breasts 2.2 cm irregular enhancing mass in the 12 o'clock region of the left breast corresponding with the biopsied low grade invasive ductal carcinoma and ductal carcinoma in-situ. 1.1 cm irregular enhancement in the 11 o'clock region of the left breast corresponding with the biopsied ductal carcinoma in-situ with fibrotic and inflamed breast tissue.      11/02/2015 Surgery    Procedure: 1. Left breast seed bracketed lumpectomy 2. Left axillary sentinel nodebiopsy Surgeon Dr Lindsay Pacheco      11/02/2015 Pathology Results    Breast, lumpectomy, Left - INVASIVE DUCTAL CARCINOMA, GRADE I/III SPANNING AT LEAST 2.2 CM. Negative sentinel nodes X 3      12/22/2015 - 02/11/2016 Radiation Therapy    Adjuvant breast radiation Lindsay Pacheco): The Left breast was treated to 50.4  Gy in 28 fractions at 1.8 Gy per fraction.  Left breast was boosted to 10 Gy in 5 fractions at 2 Gy per fraction.        INTERVAL HISTORY:  [name] presents for routine follow-up for her left breast cancer accompanied by her daughter. She completed radiation with Dr. Lisbeth Pacheco on 02/11/16.  She feels well; her breast is healing. She is still using her Radioplex lotion; denies any open wounds.  Endorses left breast pain with edema; she went to PT for lymphedema of her breast.  She tells me she is scheduled to see Dr. Lisbeth Pacheco at the end of March for radiation follow-up.     She is anticipating when to start oral anti-estrogen therapy.  She has occasional hot flashes and bilateral knee joint pain at baseline.  Overall, she feels well. Her energy levels and appetite are good.     REVIEW OF SYSTEMS:  Review of Systems  Constitutional: Negative.  Negative for chills and fever.  HENT:  Negative.   Eyes: Negative.   Respiratory: Negative.  Negative for cough and shortness of breath.   Cardiovascular: Negative.   Gastrointestinal: Negative.  Negative for constipation, diarrhea, nausea and vomiting.  Endocrine: Positive for hot flashes.  Genitourinary: Negative.  Negative for dysuria, hematuria and vaginal bleeding.   Musculoskeletal: Positive for arthralgias (some bilateral knee pain intermittently ).  Skin:       Healing radiation dermatitis to left breast   Neurological: Negative.  Negative for  dizziness and headaches.  Hematological: Negative.   Psychiatric/Behavioral: Negative.      PAST MEDICAL/SURGICAL HISTORY:  Past Medical History:  Diagnosis Date  . Anxiety   . Arthritis    "knees" (11/02/2015)  . Breast cancer, left breast ([place]) 09/30/2015  . Depression   . Diabetes mellitus, type II ([place])   . Diverticulitis 1987  . Hypertension   . Irritable bowel syndrome (IBS)   . Sleep apnea    does not use CPAP but "I'm suppose to" (11/02/2015)   Past Surgical History:  Procedure  Laterality Date  . BREAST BIOPSY Left 09/2015  . BREAST LUMPECTOMY WITH AXILLARY LYMPH NODE BIOPSY Left 11/02/2015   BREAST LUMPECTOMY WITH BRACKETED RADIOACTIVE SEED AND LEFT AXILLARY SENTINEL LYMPH NODE BIOPSY   . BREAST LUMPECTOMY WITH RADIOACTIVE SEED AND SENTINEL LYMPH NODE BIOPSY Left 11/02/2015   Procedure: LEFT BREAST LUMPECTOMY WITH BRACKETED RADIOACTIVE SEED AND LEFT AXILLARY SENTINEL LYMPH NODE BIOPSY;  Surgeon: [place] Bookbinder, MD;  Location: [facility];  Service: General;  Laterality: Left;  . COLONOSCOPY N/A 12/09/2015   Procedure: COLONOSCOPY;  Surgeon: [name], MD;  [facility];  Service: Endoscopy;  Laterality: N/A;  1:00  . EVACUATION BREAST HEMATOMA Left 11/02/2015  . EVACUATION BREAST HEMATOMA Left 11/02/2015   Procedure: EVACUATION HEMATOMA BREAST;  Surgeon: [place] Bookbinder, MD;  Location: [facility];  Service: General;  Laterality: Left;  . JOINT REPLACEMENT    . KNEE ARTHROSCOPY Right 2000s  . LAPAROSCOPIC CHOLECYSTECTOMY  1995  . POLYPECTOMY  12/09/2015   Procedure: POLYPECTOMY;  Surgeon: [name], MD;  [facility];  Service: Endoscopy;;  multiple  . TOTAL KNEE ARTHROPLASTY Right 2011     SOCIAL HISTORY:  Social History   Social History  . Marital status: Divorced    Spouse name: N/A  . Number of children: N/A  . Years of education: N/A   Occupational History  . Not on file.   Social History Main Topics  . Smoking status: Never Smoker  . Smokeless tobacco: Never Used  . Alcohol use Yes     Comment: 11/02/2015 "maybe 4 drinks/year"  . Drug use: Yes    Types: Marijuana     Comment: 11/02/2015 "drug use in my past; now maybe 2 puffs 3-4 times/year; for social anxiety"  . Sexual activity: Not Currently   Other Topics Concern  . Not on file   Social History Narrative  . No narrative on file    FAMILY HISTORY:  Family History  Problem Relation Age of Onset  . Anxiety disorder  Father   . Depression Father   . Other Father     Abestosis  . Depression Sister   . Diabetes Mother     CURRENT MEDICATIONS:  Outpatient Encounter Prescriptions as of 02/15/2016  Medication Sig  . acetaminophen (TYLENOL) 500 MG tablet Take 1,000 mg by mouth every 6 (six) hours as needed for moderate pain or headache.  Marland Kitchen atorvastatin (LIPITOR) 10 MG tablet Take 10 mg by mouth daily.  . divalproex (DEPAKOTE ER) 250 MG 24 hr tablet Take 3 tablets (750 mg total) by mouth at bedtime.  . docusate sodium (COLACE) 100 MG capsule Take 1 capsule (100 mg total) by mouth 2 (two) times daily.  . DULoxetine (CYMBALTA) 60 MG capsule Take 1 capsule (60 mg total) by mouth daily.  . hyaluronate sodium (RADIAPLEXRX) GEL Apply 1 application topically 2 (two) times daily.  Marland Kitchen letrozole (FEMARA) 2.5 MG  tablet Take 1 tablet (2.5 mg total) by mouth daily.  Marland Kitchen lisinopril-hydrochlorothiazide (PRINZIDE,ZESTORETIC) 20-12.5 MG tablet Take 1 tablet by mouth daily.  . metFORMIN (GLUCOPHAGE) 500 MG tablet Take 500 mg by mouth 2 (two) times daily with a meal.   . non-metallic deodorant (ALRA) MISC Apply 1 application topically daily as needed.  . psyllium (METAMUCIL SMOOTH TEXTURE) 28 % packet Take 1 packet by mouth at bedtime.   No facility-administered encounter medications on file as of 02/15/2016.     ALLERGIES:  Allergies  Allergen Reactions  . Adhesive [Tape] Rash  . Sulfa Antibiotics Nausea And Vomiting     PHYSICAL EXAM:  ECOG Performance status: 1 - Symptomatic, but independent   Vitals:   02/15/16 1140  BP: 117/70  Pulse: 88  Resp: 18  Temp: 97.8 F (36.6 C)   Filed Weights   02/15/16 1140  Weight: 270 lb 12.8 oz (122.8 kg)    Physical Exam  Constitutional: She is oriented to person, place, and time and well-developed, well-nourished, and in no distress.  HENT:  Head: Normocephalic.  Mouth/Throat: Oropharynx is clear and moist. No oropharyngeal exudate.  Eyes: Conjunctivae are normal.  Pupils are equal, round, and reactive to light. No scleral icterus.  Neck: Normal range of motion. Neck supple.  Cardiovascular: Normal rate, regular rhythm and normal heart sounds.   Pulmonary/Chest: Effort normal and breath sounds normal. No respiratory distress.  Abdominal: Soft. Bowel sounds are normal. There is no tenderness.  Musculoskeletal: Normal range of motion. She exhibits no edema.  Lymphadenopathy:    She has no cervical adenopathy.  Neurological: She is alert and oriented to person, place, and time. No cranial nerve deficit. Gait normal.  Skin: Skin is warm and dry.  Psychiatric: Mood, memory, affect and judgment normal.     LABORATORY DATA:  I have reviewed the labs as listed.  CBC    Component Value Date/Time   WBC 11.5 (H) 11/02/2015 1555   RBC 3.66 (L) 11/02/2015 1555   HGB 9.9 (L) 11/02/2015 1555   HCT 30.5 (L) 11/02/2015 1555   PLT 252 11/02/2015 1555   MCV 83.3 11/02/2015 1555   MCH 27.0 11/02/2015 1555   MCHC 32.5 11/02/2015 1555   RDW 13.8 11/02/2015 1555   LYMPHSABS 2.8 01/20/2010 1045   MONOABS 0.6 01/20/2010 1045   EOSABS 0.3 01/20/2010 1045   BASOSABS 0.3 (H) 01/20/2010 1045   CMP Latest Ref Rng & Units 10/28/2015 01/20/2010 10/17/2009  Glucose 65 - 99 mg/dL 176(H) 123(H) 129(H)  BUN 6 - 20 mg/dL '8 13 15 ' RESULT REPEATED AND VERIFIED  Creatinine 0.44 - 1.00 mg/dL 0.92 1.05 1.35 RESULT REPEATED AND VERIFIED(H)  Sodium 135 - 145 mmol/L 137 140 130(L)  Potassium 3.5 - 5.1 mmol/L 3.6 4.1 4.0  Chloride 101 - 111 mmol/L 103 106 94(L)  CO2 22 - 32 mmol/L '23 25 29  ' Calcium 8.9 - 10.3 mg/dL 9.5 9.8 8.8  Total Protein 6.0 - 8.3 g/dL - - -  Total Bilirubin 0.3 - 1.2 mg/dL - - -  Alkaline Phos 39 - 117 U/L - - -  AST 0 - 37 U/L - - -  ALT 0 - 35 U/L - - -    PENDING LABS:    DIAGNOSTIC IMAGING:  DEXA scan: 12/07/15    PATHOLOGY:  Surgical path: 11/02/15      ASSESSMENT & PLAN:   Stage IIA left breast cancer; ER+/PR+/HER2- -s/p adjuvant  radiation therapy with Dr. Lisbeth Pacheco; completed 02/11/16. She is  continuing to heal. We discussed that she will continue to heal over the next several months.  -Tumor ER+; we discussed starting adjuvant anti-estrogen therapy. She is post-menopausal and thus will need an aromatase inhibitor, like Letrozole.  Discussed common side effects of AI including: hot flashes, vaginal dryness, arthralgias, and decreased bone density.  Planned duration of treatment is 5-10 years; she would qualify for Breast Cancer Index genomic testing in the future, if needed.  -She will start Letrozole on 03/03/16, allowing her a few more weeks to heal from radiation therapy.  -Letrozole 2.5 mg po daily e-prescribed to pharmacy; gave her instructions for use.  -First post-treatment diagnostic mammogram will be due in 09/2016.   Bone health:  -Last DEXA scan on 12/07/15 was normal.  She will be due for biennial DEXA scan in 12/2017 while on anti-estrogen therapy.  -Recommended daily calcium (1200 mg/day) with vitamin D (800 IU/day) supplementation and increasing weight-bearing activities.    Lymphedema:  -Left breast edema likely secondary to lumpectomy and radiation therapy; she has already been to PT; encouraged her to perform lymphedema massage at home, as directed by PT.  We can certainly refer her back to PT in the future, if needed.     Dispo:  -Return to cancer [facility] mid-April for follow-up/toxicity assessment of Letrozole.    All questions were answered to patient's stated satisfaction. Encouraged patient to call with any new concerns or questions before her next visit to the cancer [facility] and we can certain see her sooner, if needed.        Mike Craze, NP [place] [phone]

## 2016-02-17 NOTE — Progress Notes (Signed)
  Radiation Oncology         [phone] ________________________________  Name: OLETTA BUEHRING MRN: [id number]  Date: 02/11/2016  DOB: 1960/07/25  End of Treatment Note  Diagnosis:   Stage IIA (pT2, pN0) grade 1 invasive ductal carcinoma of the left breast (ER/PR+, HER2-)  Indication for treatment:  Curative, reduce the risk of recurrence  Radiation treatment dates:   12/22/15 - 02/11/16  Site/dose:    1) Left breast: 50.4 Gy in 28 fractions 2) Left breast boost: 10 Gy in 5 fractions  Beams/energy:    1) 3D // 10X Photon 2) Isodose Plan // 10X, 6X Photon  Narrative: The patient tolerated radiation treatment relatively well. The patient developed mild hyperpigmentation and erythema of the left breast with no moist desquamation noted. The patient had mild pruritus of the left breast which she used hydrocortisone cream.  Plan: The patient has completed radiation treatment. The patient will return to radiation oncology clinic for routine followup in one month. I advised them to call or return sooner if they have any questions or concerns related to their recovery or treatment.  ------------------------------------------------  [name], MD, PhD  This document serves as a record of services personally performed by Kyung Rudd, MD. It was created on his behalf by Darcus Austin, a trained medical scribe. The creation of this record is based on the scribe's personal observations and the provider's statements to them. This document has been checked and approved by the attending provider.

## 2016-02-18 NOTE — Progress Notes (Signed)
BH MD/PA/NP OP Progress Note  02/23/2016 10:26 AM Lindsay Pacheco  MRN:  [id number]  Chief Complaint:  Chief Complaint    Depression; Anxiety; Follow-up     Subjective:  "I have severe mood swings" HPI:  Patient presents for follow up appointment. She states that she continues to have "anger issues, severe mood swings," and she feels frustrated that she cannot afford for therapy. She also reports that she cannot walking due to her knee pain, while she is not able to afford bicycle or swimming. She cannot wear CPAP mask and wants to see a provider. She reports she finished six weeks of radiation treatment which causes her financial burden. She has been unable to find her job in her area. She lives with her mother who she does not get long with. She sleeps better after starting Depakote, although she also reports fear of sedation and possibility of not breathing at night. She reports passive SI, although she denies any plans/intent.   Visit Diagnosis:    ICD-9-CM ICD-10-CM   1. Moderate episode of recurrent major depressive disorder (HCC) 296.32 F33.1 DULoxetine (CYMBALTA) 60 MG capsule     Valproic acid level    Past Psychiatric History:  Outpatient: used to be seen at [facility] for counseling Psychiatry admission: [facility] in 2012 for depression, SI of overdosing Ativan, IOP Previous suicide attempt: SI of overdosing Ativan in 2012, no SIB Past trials of medication: Paxil (felt numb), Effexor, Abilify, Klonopin,    Past Medical History:  Past Medical History:  Diagnosis Date  . Anxiety   . Arthritis    "knees" (11/02/2015)  . Breast cancer, left breast ([place]) 09/30/2015  . Depression   . Diabetes mellitus, type II ([place])   . Diverticulitis 1987  . Hypertension   . Irritable bowel syndrome (IBS)   . Sleep apnea    does not use CPAP but "I'm suppose to" (11/02/2015)    Past Surgical History:  Procedure Laterality Date  . BREAST BIOPSY Left 09/2015  . BREAST LUMPECTOMY WITH  AXILLARY LYMPH NODE BIOPSY Left 11/02/2015   BREAST LUMPECTOMY WITH BRACKETED RADIOACTIVE SEED AND LEFT AXILLARY SENTINEL LYMPH NODE BIOPSY   . BREAST LUMPECTOMY WITH RADIOACTIVE SEED AND SENTINEL LYMPH NODE BIOPSY Left 11/02/2015   Procedure: LEFT BREAST LUMPECTOMY WITH BRACKETED RADIOACTIVE SEED AND LEFT AXILLARY SENTINEL LYMPH NODE BIOPSY;  Surgeon: [name], MD;  Location: [facility];  Service: General;  Laterality: Left;  . COLONOSCOPY N/A 12/09/2015   Procedure: COLONOSCOPY;  Surgeon: [name], MD;  [facility];  Service: Endoscopy;  Laterality: N/A;  1:00  . EVACUATION BREAST HEMATOMA Left 11/02/2015  . EVACUATION BREAST HEMATOMA Left 11/02/2015   Procedure: EVACUATION HEMATOMA BREAST;  Surgeon: [name], MD;  Location: [facility];  Service: General;  Laterality: Left;  . JOINT REPLACEMENT    . KNEE ARTHROSCOPY Right 2000s  . LAPAROSCOPIC CHOLECYSTECTOMY  1995  . POLYPECTOMY  12/09/2015   Procedure: POLYPECTOMY;  Surgeon: [name], MD;  [facility];  Service: Endoscopy;;  multiple  . TOTAL KNEE ARTHROPLASTY Right 2011    Family Psychiatric History:  father- anxiety, depression, paternal grandmother- depression,  sister- depression, anxiety  Family History:  Family History  Problem Relation Age of Onset  . Anxiety disorder Father   . Depression Father   . Other Father     Abestosis  . Depression Sister   . Diabetes Mother     Social History:  Social History   Social History  . Marital status: Divorced    Spouse name: N/A  . Number of children: N/A  . Years of education: N/A   Social History Main Topics  . Smoking status: Never Smoker  . Smokeless tobacco: Never Used  . Alcohol use Yes     Comment: 11/02/2015 "maybe 4 drinks/year"  . Drug use: Yes    Types: Marijuana     Comment: 11/02/2015 "drug use in my past; now maybe 2 puffs 3-4 times/year; for social anxiety"  . Sexual  activity: Not Currently   Other Topics Concern  . None   Social History Narrative  . None   On disability for chronic pain secondary to arthritis, depression, anxiety She used to work at [employer] until 2011 when she was admitted Education: graduated from [employer], college, no degrees,  Divorced in 1997, has one daughter She lives with her mother  Allergies:  Allergies  Allergen Reactions  . Adhesive [Tape] Rash  . Sulfa Antibiotics Nausea And Vomiting    Metabolic Disorder Labs: No results found for: HGBA1C, MPG No results found for: PROLACTIN No results found for: CHOL, TRIG, HDL, CHOLHDL, VLDL, LDLCALC    Labs in 08/2015 CBC, CMP wnl, Lipid LDL 115, TSH 0.691 (see scan)  Current Medications: Current Outpatient Prescriptions  Medication Sig Dispense Refill  . acetaminophen (TYLENOL) 500 MG tablet Take 1,000 mg by mouth every 6 (six) hours as needed for moderate pain or headache.    Marland Kitchen atorvastatin (LIPITOR) 10 MG tablet Take 10 mg by mouth daily.    . divalproex (DEPAKOTE ER) 250 MG 24 hr tablet Take 3 tablets (750 mg total) by mouth at bedtime. 90 tablet 1  . docusate sodium (COLACE) 100 MG capsule Take 1 capsule (100 mg total) by mouth 2 (two) times daily. 10 capsule 0  . DULoxetine (CYMBALTA) 60 MG capsule Take 1 capsule (60 mg total) by mouth daily. 30 capsule 1  . hyaluronate sodium (RADIAPLEXRX) GEL Apply 1 application topically 2 (two) times daily.    Marland Kitchen letrozole (FEMARA) 2.5 MG tablet Take 1 tablet (2.5 mg total) by mouth daily. 90 tablet 3  . lisinopril-hydrochlorothiazide (PRINZIDE,ZESTORETIC) 20-12.5 MG tablet Take 1 tablet by mouth daily.  6  . metFORMIN (GLUCOPHAGE) 500 MG tablet Take 500 mg by mouth 2 (two) times daily with a meal.     . non-metallic deodorant (ALRA) MISC Apply 1 application topically daily as needed.    . psyllium (METAMUCIL SMOOTH TEXTURE) 28 % packet Take 1 packet by mouth at bedtime.     No current facility-administered  medications for this visit.     Neurologic: Headache: Yes Seizure: No Paresthesias: No  Musculoskeletal: Strength & Muscle Tone: within normal limits Gait & Station: normal Patient leans: N/A  Psychiatric Specialty Exam: Review of Systems  Neurological: Positive for tingling.  Psychiatric/Behavioral: Positive for depression and suicidal ideas. Negative for hallucinations and substance abuse. The patient is nervous/anxious and has insomnia.   All other systems reviewed and are negative.   Blood pressure 110/71, pulse 88, height 5\' 7"  (1.702 m), weight 265 lb (120.2 kg), SpO2 92 %.Body mass index is 41.5 kg/m.  General Appearance: Fairly Groomed  Eye Contact:  Fair  Speech:  Clear and Coherent  Volume:  Normal  Mood:  Depressed  Affect:  Labile and Tearful  Thought Process:  Coherent and Goal Directed  Orientation:  Full (Time, Place, and Person)  Thought Content: Logical  Perceptions: denies AH/VH  Suicidal Thoughts:  No  Homicidal Thoughts:  No  Memory:  Immediate;   Good Recent;   Good Remote;   Good  Judgement:  Good  Insight:  Fair  Psychomotor Activity:  Increased- improved  Concentration:  Concentration: Good and Attention Span: Good  Recall:  Good  Fund of Knowledge: Good  Language: Good  Akathisia:  No  Handed:  Right  AIMS (if indicated):  N/A  Assets:  Communication Skills Desire for Improvement  ADL's:  Intact  Cognition: WNL  Sleep:  good   Assessment NICHOLETTE TERRELL is a 56 year old female with depression, diabetes, hypertension, left breast cancer, irritable bowel syndrome, who presented for follow up appointment.   # MDD # r/o borderline personality disorder Patient continues to demonstrate irritability and labile affect, although she is easily redirectable and well responds to supportive therapy. She does have cluster B traits, which contributes to her moods symptoms. Will consider uptitration of Depakote to target emotional dysregulation, after  obtaining level and adjusting her CPAP machine. Although she would greatly benefit from DBT, she is unable to do so due to her insurance issues. Encouraged her to practice mindfulness; resources are provided. Although it is preferable to do monthly appointment, will do two months appointment given patient financial burden.   Plan -  Continue Depakote 750 mg at night - Obtain Depakote level - Continue duloxetine 60 mg daily -  Return to clinic in two months  Treatment Plan Summary: Plan as above  The patient demonstrates the following risk factors for suicide: Chronic risk factors for suicide include: psychiatric disorder of depression, substance use disorder, previous suicide attempts overdosing ativan and chronic pain. Acute risk factors for suicide include: family or marital conflict, unemployment, social withdrawal/isolation and loss (financial, interpersonal, professional). Protective factors for this patient include: coping skills and hope for the future. Considering these factors, the overall suicide risk at this point appears to be low. Patient is appropriate for outpatient follow up.  Norman Clay, MD 02/23/2016, 10:26 AM

## 2016-02-23 ENCOUNTER — Ambulatory Visit (INDEPENDENT_AMBULATORY_CARE_PROVIDER_SITE_OTHER): Payer: Medicare Other | Admitting: Psychiatry

## 2016-02-23 ENCOUNTER — Encounter (HOSPITAL_COMMUNITY): Payer: Self-pay | Admitting: Psychiatry

## 2016-02-23 VITALS — BP 110/71 | HR 88 | Ht 67.0 in | Wt 265.0 lb

## 2016-02-23 DIAGNOSIS — Z833 Family history of diabetes mellitus: Secondary | ICD-10-CM

## 2016-02-23 DIAGNOSIS — K589 Irritable bowel syndrome without diarrhea: Secondary | ICD-10-CM

## 2016-02-23 DIAGNOSIS — C50912 Malignant neoplasm of unspecified site of left female breast: Secondary | ICD-10-CM | POA: Diagnosis not present

## 2016-02-23 DIAGNOSIS — E119 Type 2 diabetes mellitus without complications: Secondary | ICD-10-CM

## 2016-02-23 DIAGNOSIS — Z818 Family history of other mental and behavioral disorders: Secondary | ICD-10-CM

## 2016-02-23 DIAGNOSIS — Z9889 Other specified postprocedural states: Secondary | ICD-10-CM

## 2016-02-23 DIAGNOSIS — Z882 Allergy status to sulfonamides status: Secondary | ICD-10-CM

## 2016-02-23 DIAGNOSIS — I10 Essential (primary) hypertension: Secondary | ICD-10-CM

## 2016-02-23 DIAGNOSIS — M13869 Other specified arthritis, unspecified knee: Secondary | ICD-10-CM

## 2016-02-23 DIAGNOSIS — F331 Major depressive disorder, recurrent, moderate: Secondary | ICD-10-CM

## 2016-02-23 DIAGNOSIS — Z9109 Other allergy status, other than to drugs and biological substances: Secondary | ICD-10-CM

## 2016-02-23 DIAGNOSIS — Z8489 Family history of other specified conditions: Secondary | ICD-10-CM

## 2016-02-23 MED ORDER — DULOXETINE HCL 60 MG PO CPEP: 60.0000 mg | ORAL_CAPSULE | Freq: Every day | ORAL | 1 refills | Status: DC

## 2016-02-23 MED ORDER — DIVALPROEX SODIUM ER 250 MG PO TB24: 750.0000 mg | ORAL_TABLET | Freq: Every day | ORAL | 1 refills | Status: DC

## 2016-02-23 NOTE — Patient Instructions (Signed)
-    Continue Depakote 750 mg at night - Continue duloxetine 60 mg daily -  Return to clinic in two months

## 2016-03-14 ENCOUNTER — Telehealth: Payer: Self-pay | Admitting: *Deleted

## 2016-03-14 ENCOUNTER — Other Ambulatory Visit: Payer: Self-pay | Admitting: Radiation Oncology

## 2016-03-14 DIAGNOSIS — C50211 Malignant neoplasm of upper-inner quadrant of right female breast: Secondary | ICD-10-CM

## 2016-03-14 DIAGNOSIS — C50212 Malignant neoplasm of upper-inner quadrant of left female breast: Principal | ICD-10-CM

## 2016-03-14 NOTE — Telephone Encounter (Signed)
Referral placed to Dr. Jana Hakim

## 2016-03-14 NOTE — Telephone Encounter (Signed)
Returned call to the patient, she is requesting a medical oncologist here at [facility], Dr. Whitney Muse has left her service, patient wants to see an MD not a Nurse practioner, she saw gretchen Renato Battles back in February 2018,  Wants to see Dr. Jana Hakim or Dr. Julien Nordmann if possible, will forward this to our PA toplace referral, patient thnaked this RN 2:31 PM

## 2016-03-18 NOTE — Progress Notes (Signed)
Lindsay Pacheco 56 y.o. woman with Stage IIA (pT2, pN0) grade 1 invasive ductal carcinoma of the left breast (ER/PR+, HER2-) radiation completed 02-11-16 1 month FU.  Skin status:Left breast and axilla with mild tanning breast sill sensitive. Left arm feels heavy. What lotion are you using? Using lotion with vitamin E. Have you seen med onc? If not, when is appointment: 11-25-15 Dr. Whitney Muse   Will start seeing Dr. Jana Hakim 04-07-16 If they are ER+, have they started Al or Tamoxifen? If not, why?  03-03-16 Letrozole Discuss survivorship appointment. Not scheduled Have you had a mammogram scheduled? Not scheduled yet Offer referral to [facility]. Will receive at the survivorship appointment. Oncotype Dx. Score: 10-14-15 Recurrence score result 18 Appetite:Good eating three meals per day. Pain:None Fatigue:Yes having mild, has sleep apnea does not wear her cpap machine. Arm mobility:Able to raise her left arm without difficulty. Lymphedema:Has swelling down to her elbow.  Had PT four weeks after her surgery for 6-8 weeks. Wt Readings from Last 3 Encounters:  03/31/16 270 lb (122.5 kg)  02/15/16 270 lb 12.8 oz (122.8 kg)  02/11/16 267 lb (121.1 kg)  BP 115/74   Pulse 95   Temp 98.2 F (36.8 C) (Oral)   Resp 18   Ht _0  (1.702 m)   Wt 270 lb (122.5 kg)   SpO2 96%   BMI 42.29 kg/m

## 2016-03-21 ENCOUNTER — Telehealth: Payer: Self-pay | Admitting: *Deleted

## 2016-03-21 ENCOUNTER — Telehealth: Payer: Self-pay | Admitting: Oncology

## 2016-03-21 NOTE — Telephone Encounter (Signed)
Received a call from Lindsay Pacheco in [place] to schedule the pt an appt. Appt has been scheduled for the pt to see Dr. Jana Hakim on 4/5 at 4pm. [name] will notify the pt of the appt date and time.

## 2016-03-21 NOTE — Telephone Encounter (Signed)
Called patient to inform of appt. with Dr. Jana Hakim on 04-07-16- arrival time - 3:30 pm, spoke with patient and she is aware of this appt.

## 2016-03-31 ENCOUNTER — Ambulatory Visit
Admission: RE | Admit: 2016-03-31 | Discharge: 2016-03-31 | Disposition: A | Discharge status: Home / Self Care | Payer: Medicare Other | Source: Ambulatory Visit | Attending: Radiation Oncology | Admitting: Radiation Oncology

## 2016-03-31 VITALS — BP 115/74 | HR 95 | Temp 98.2°F | Resp 18 | Ht 67.0 in | Wt 270.0 lb

## 2016-03-31 DIAGNOSIS — I890 Lymphedema, not elsewhere classified: Secondary | ICD-10-CM | POA: Diagnosis not present

## 2016-03-31 DIAGNOSIS — Z888 Allergy status to other drugs, medicaments and biological substances status: Secondary | ICD-10-CM | POA: Diagnosis not present

## 2016-03-31 DIAGNOSIS — Z79899 Other long term (current) drug therapy: Secondary | ICD-10-CM | POA: Diagnosis not present

## 2016-03-31 DIAGNOSIS — C50912 Malignant neoplasm of unspecified site of left female breast: Secondary | ICD-10-CM | POA: Insufficient documentation

## 2016-03-31 DIAGNOSIS — Z923 Personal history of irradiation: Secondary | ICD-10-CM | POA: Diagnosis not present

## 2016-03-31 DIAGNOSIS — Z7984 Long term (current) use of oral hypoglycemic drugs: Secondary | ICD-10-CM | POA: Insufficient documentation

## 2016-03-31 DIAGNOSIS — Z170 Estrogen receptor positive status [ER+]: Secondary | ICD-10-CM | POA: Insufficient documentation

## 2016-03-31 DIAGNOSIS — C50212 Malignant neoplasm of upper-inner quadrant of left female breast: Secondary | ICD-10-CM

## 2016-04-01 NOTE — Progress Notes (Signed)
Radiation Oncology         [phone] ________________________________  Name: Lindsay Pacheco MRN: [id number]  Date: 03/31/2016  DOB: 04/11/1960  Post Treatment Note  CC: Monico Blitz, MD  Patrici Ranks, MD  Diagnosis:   Stage IIA (pT2, pN0) grade 1 invasive ductal carcinoma of the left breast (ER/PR+, HER2-)  Interval Since Last Radiation:  7 weeks   12/22/15 - 02/11/16: 1. Left breast: 50.4 Gy in 28 fractions 2. Left breast boost: 10 Gy in 5 fractions  Narrative:  The patient returns today for routine follow-up. During treatment she did very well with radiotherapy and did not have significant desquamation.                             On review of systems, the patient states she's doing well. She's planning to transfer her care to Dr. Jana Hakim and will see him in April. She states she's done well with Letrozole and has not experienced any untoward side effects. She is concerned about mild swelling of the left upper extremity and reports she's left handed which makes things difficult to avoid in terms of movement. She describes some lingering pigment change of the skin the radiation field. No other complaints are noted.  ALLERGIES:  is allergic to adhesive [tape] and sulfa antibiotics.  Meds: Current Outpatient Prescriptions  Medication Sig Dispense Refill  . acetaminophen (TYLENOL) 500 MG tablet Take 1,000 mg by mouth every 6 (six) hours as needed for moderate pain or headache.    Marland Kitchen atorvastatin (LIPITOR) 10 MG tablet Take 10 mg by mouth daily.    . divalproex (DEPAKOTE ER) 250 MG 24 hr tablet Take 3 tablets (750 mg total) by mouth at bedtime. 90 tablet 1  . DULoxetine (CYMBALTA) 60 MG capsule Take 1 capsule (60 mg total) by mouth daily. 30 capsule 1  . letrozole (FEMARA) 2.5 MG tablet Take 1 tablet (2.5 mg total) by mouth daily. 90 tablet 3  . lisinopril-hydrochlorothiazide (PRINZIDE,ZESTORETIC) 20-12.5 MG tablet Take 1 tablet by mouth daily.  6  . metFORMIN (GLUCOPHAGE) 500 MG  tablet Take 500 mg by mouth 2 (two) times daily with a meal.     . docusate sodium (COLACE) 100 MG capsule Take 1 capsule (100 mg total) by mouth 2 (two) times daily. (Patient not taking: Reported on 03/31/2016) 10 capsule 0  . psyllium (METAMUCIL SMOOTH TEXTURE) 28 % packet Take 1 packet by mouth at bedtime. (Patient not taking: Reported on 03/31/2016)     No current facility-administered medications for this encounter.     Physical Findings:  height is 5' 7" (1.702 m) and weight is 270 lb (122.5 kg). Her oral temperature is 98.2 F (36.8 C). Her blood pressure is 115/74 and her pulse is 95. Her respiration is 18 and oxygen saturation is 96%.  Pain Assessment Pain Score: 0-No pain/10 In general this is a well appearing caucasian female in no acute distress. She's alert and oriented x4 and appropriate throughout the examination. Cardiopulmonary assessment is negative for acute distress and she exhibits normal effort. The left breast was examined and reveals no desquamation. There is minimal hyperpigmentation along the lateral chest wall and base of the axilla. No chest wall edema is seen. The left upper extremity has very early appearing edema from the shoulder to the mid forearm. This is not pitting. No pain is noted with palpation.   Lab Findings: Lab Results  Component Value Date  WBC 11.5 (H) 11/02/2015   HGB 9.9 (L) 11/02/2015   HCT 30.5 (L) 11/02/2015   MCV 83.3 11/02/2015   PLT 252 11/02/2015     Radiographic Findings: No results found.  Impression/Plan: 1. Stage IIA (pT2, pN0) grade 1 invasive ductal carcinoma of the left breast (ER/PR+, HER2-). The patient has been doing well since completion of radiotherapy. We discussed that we would be happy to continue to follow her as needed, but she will also continue to follow up with Dr. Jana Hakim in medical oncology and will continue her Letrozole. She was counseled on skin care as well as measures to avoid sun exposure to this area.   2. Survivorship. The patient will be scheduled by medical oncology for survivorship. 3. Questionable left upper extremity lymphedema. The patient has been seen previously by PT for fascial release, and was encouraged to consider another evaluation if her symptoms progress. I've given her the name of several places to get fitted for compression sleeves and/or modifications she can do with some over the counter products.    Carola Rhine, PAC

## 2016-04-07 ENCOUNTER — Ambulatory Visit (HOSPITAL_BASED_OUTPATIENT_CLINIC_OR_DEPARTMENT_OTHER): Payer: Medicare Other | Admitting: Oncology

## 2016-04-07 VITALS — BP 118/69 | HR 102 | Temp 97.5°F | Resp 18 | Ht 67.0 in | Wt 270.3 lb

## 2016-04-07 DIAGNOSIS — C50812 Malignant neoplasm of overlapping sites of left female breast: Secondary | ICD-10-CM | POA: Diagnosis not present

## 2016-04-07 DIAGNOSIS — C50212 Malignant neoplasm of upper-inner quadrant of left female breast: Secondary | ICD-10-CM

## 2016-04-07 DIAGNOSIS — Z170 Estrogen receptor positive status [ER+]: Secondary | ICD-10-CM | POA: Diagnosis not present

## 2016-04-07 DIAGNOSIS — Z79811 Long term (current) use of aromatase inhibitors: Secondary | ICD-10-CM | POA: Diagnosis not present

## 2016-04-07 NOTE — Progress Notes (Signed)
[facility]  Telephone:([phone] Fax:(336) [phone]     ID: Lindsay Pacheco DOB: [birth year]  MR#: [id number]  HWE#:[id number]  Patient Care Team: Monico Blitz, MD as PCP - General (Internal Medicine) Chauncey Cruel, MD as Consulting Physician (Oncology) Kyung Rudd, MD as Consulting Physician (Radiation Oncology) [name], MD as Consulting Physician (General Surgery) [name], MD as Consulting Physician (Gastroenterology) Norman Clay, MD as Consulting Physician (Psychiatry) Gaynelle Arabian, MD as Consulting Physician (Orthopedic Surgery) Chauncey Cruel, MD OTHER MD:  CHIEF COMPLAINT: Estrogen receptor positive breast cancer  CURRENT TREATMENT: Letrozole   BREAST CANCER HISTORY: The patient had routine bilateral screening mammography 09/10/2015 showing apparent new right breast calcifications and an area of distortion and possible mass in the left breast. On 09/23/2015 she underwent bilateral diagnostic mammography and left breast ultrasonography. The breast density was category C. The right breast calcification were felt to be benign. However in the left breast there was a persistent area of distortion in the superior subareolar areolar area, with 2 or 3 small masses in the upper inner quadrant. On exam there was an area of firm tissue just above the nipple of the left breast. Ultrasonography confirmed an area of shadowing at the 12:00 radiant 1 cm from the nipple and in addition 3 adjacent hypoechoic masses in the upper inner quadrant of the left breast, the largest measuring 1.1 cm and altogether measuring 2.5 cm. Ultrasound of the left axilla was benign  On 09/30/2015 the patient underwent cyst aspiration and also ultrasound-guided biopsy of 2 areas in the left breast, described as 11:00 and 12:00. The 12:00 area showed invasive ductal carcinoma, grade 1. The 11:00 biopsy showed ductal carcinoma in situ. The invasive tumor was estrogen receptor positive  at greater than 90%, progesterone receptor positive at greater than 90%, both with strong staining intensity. HER-2 was equivocal by immunohistochemistry but negative by FISH with a signals ratio of 1.23 and the number per cell 2.0.  On 10/12/2015 the patient had a breast MRI showing no findings on the right, but on the left at the 12:00 region there was a spiculated enhancing mass measuring 2.2 cm. In the upper inner quadrant of the left breast there was an area of enhancement measuring 1.1 cm. In the lateral aspect of the breast was a benign-appearing intramammary lymph node and there were no abnormal appearing lymph nodes in either axilla.  With this information the patient proceeded to left seed bracketed lumpectomy and sentinel lymph node sampling 11/02/2015. The final pathology ([id number]) confirmed invasive ductal carcinoma, grade 1, measuring 2.2 cm, with all 3 sentinel lymph nodes clear. Margins were clear. The repeat prognostic panel again showed estrogen receptor 100% positivity, progesterone receptor 100% positivity, both with strong staining intensity, and no HER-2 amplification, the signals ratio being 1.06 and the number per cell 1.90.  Oncotype DX score of 18 predicted a 10 year risk of outside the breast recurrence of 12% if the patient's only systemic treatment was tamoxifen for 5 years. It also predicted no significant benefit from chemotherapy The patient then proceeded to adjuvant radiation completed 02/11/2016. She was started on letrozole 03/03/2016 by our survivorship nurse practitioner  Her subsequent history is as detailed below  INTERVAL HISTORY: Lindsay Pacheco was evaluated in the breast clinic 04/06/2016. She established herself in my service today. She was started on letrozole 03/03/2016. She is tolerating this remarkably well. Hot flashes and vaginal dryness are not a major issue. She never developed the arthralgias or myalgias  that many patients can experience on this  medication. She obtains it at a good price.  REVIEW OF SYSTEMS: She is disabled partly because of depression but more because of knee problems. She can only swim or do recumbent bikink for exercise but she does not have any place where she can do it. She cannot afford a gym membership and they do not have a Livestrong program locally. She denies unusual headaches, visual changes, cough, phlegm production, pleurisy, shortness of breath, or change in bowel or bladder habits. A detailed review of systems today was otherwise stable  PAST MEDICAL HISTORY: Past Medical History:  Diagnosis Date  . Anxiety   . Arthritis    "knees" (11/02/2015)  . Breast cancer, left breast ([place]) 09/30/2015  . Depression   . Diabetes mellitus, type II ([place])   . Diverticulitis 1987  . Hypertension   . Irritable bowel syndrome (IBS)   . Sleep apnea    does not use CPAP but "I'm suppose to" (11/02/2015)    PAST SURGICAL HISTORY: Past Surgical History:  Procedure Laterality Date  . BREAST BIOPSY Left 09/2015  . BREAST LUMPECTOMY WITH AXILLARY LYMPH NODE BIOPSY Left 11/02/2015   BREAST LUMPECTOMY WITH BRACKETED RADIOACTIVE SEED AND LEFT AXILLARY SENTINEL LYMPH NODE BIOPSY   . BREAST LUMPECTOMY WITH RADIOACTIVE SEED AND SENTINEL LYMPH NODE BIOPSY Left 11/02/2015   Procedure: LEFT BREAST LUMPECTOMY WITH BRACKETED RADIOACTIVE SEED AND LEFT AXILLARY SENTINEL LYMPH NODE BIOPSY;  Surgeon: [name], MD;  Location: [facility];  Service: General;  Laterality: Left;  . COLONOSCOPY N/A 12/09/2015   Procedure: COLONOSCOPY;  Surgeon: [name], MD;  [facility];  Service: Endoscopy;  Laterality: N/A;  1:00  . EVACUATION BREAST HEMATOMA Left 11/02/2015  . EVACUATION BREAST HEMATOMA Left 11/02/2015   Procedure: EVACUATION HEMATOMA BREAST;  Surgeon: [name], MD;  Location: [facility];  Service: General;  Laterality: Left;  . JOINT REPLACEMENT    . KNEE  ARTHROSCOPY Right 2000s  . LAPAROSCOPIC CHOLECYSTECTOMY  1995  . POLYPECTOMY  12/09/2015   Procedure: POLYPECTOMY;  Surgeon: [name], MD;  [facility];  Service: Endoscopy;;  multiple  . TOTAL KNEE ARTHROPLASTY Right 2011    FAMILY HISTORY Family History  Problem Relation Age of Onset  . Anxiety disorder Father   . Depression Father   . Other Father     Abestosis  . Depression Sister   . Diabetes Mother   The patient's parents are still living, in their mid to late 11s. The patient has one brother and one sister. On the paternal side one uncle had spinal cord cancer and both paternal grandparents had lung cancer in the setting of tobacco abuse. The patient has one paternal aunt diagnosed with breast cancer in her 64s  GYNECOLOGIC HISTORY:  No LMP recorded. Patient is postmenopausal. Menarche age 46, first live birth age 18, she is Willis P1. She went through menopause in her early 39s. She never took hormone replacement.  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 She is divorced. At home her mother has recently moved in with her. Daughter Benjie Karvonen half graduated from [employer] and is working as Architectural technologist for [employer] but lives locally. The patient is a Tourist information centre manager.    ADVANCED DIRECTIVES: Patient has the paperwork but has not yet completed it   HEALTH MAINTENANCE: Social History  Substance  Use Topics  . Smoking status: Never Smoker  . Smokeless tobacco: Never Used  . Alcohol use Yes     Comment: 11/02/2015 "maybe 4 drinks/year"     Colonoscopy: 12/09/2015/[facility]  PAP:  Bone density: DEC 2017/ [facility]   Allergies  Allergen Reactions  . Adhesive [Tape] Rash  . Sulfa Antibiotics Nausea And Vomiting    Current Outpatient Prescriptions  Medication Sig Dispense Refill  . acetaminophen (TYLENOL) 500 MG tablet Take 1,000 mg by mouth every 6 (six) hours as  needed for moderate pain or headache.    Marland Kitchen atorvastatin (LIPITOR) 10 MG tablet Take 10 mg by mouth daily.    . divalproex (DEPAKOTE ER) 250 MG 24 hr tablet Take 3 tablets (750 mg total) by mouth at bedtime. 90 tablet 1  . docusate sodium (COLACE) 100 MG capsule Take 1 capsule (100 mg total) by mouth 2 (two) times daily. 10 capsule 0  . DULoxetine (CYMBALTA) 60 MG capsule Take 1 capsule (60 mg total) by mouth daily. 30 capsule 1  . letrozole (FEMARA) 2.5 MG tablet Take 1 tablet (2.5 mg total) by mouth daily. 90 tablet 3  . lisinopril-hydrochlorothiazide (PRINZIDE,ZESTORETIC) 20-12.5 MG tablet Take 1 tablet by mouth daily.  6  . metFORMIN (GLUCOPHAGE) 500 MG tablet Take 500 mg by mouth 2 (two) times daily with a meal.     . psyllium (METAMUCIL SMOOTH TEXTURE) 28 % packet Take 1 packet by mouth at bedtime.     No current facility-administered medications for this visit.     OBJECTIVE: Morbidly obese white woman Vitals:   04/07/16 1533  BP: 118/69  Pulse: (!) 102  Resp: 18  Temp: 97.5 F (36.4 C)     Body mass index is 42.33 kg/m.    ECOG FS:1 - Symptomatic but completely ambulatory  Ocular: Sclerae unicteric, pupils equal, round and reactive to light Ear-nose-throat: Oropharynx clear and moist Lymphatic: No cervical or supraclavicular adenopathy Lungs no rales or rhonchi, good excursion bilaterally Heart regular rate and rhythm, no murmur appreciated Abd soft, nontender, positive bowel sounds MSK no focal spinal tenderness, no joint edema Neuro: non-focal, well-oriented, appropriate affect Breasts: The right breast is unremarkable. The left breast is status post lumpectomy and radiation. The cosmetic result is good. There is no evidence of local recurrence. Both axillae are benign.   LAB RESULTS:  CMP     Component Value Date/Time   NA 137 10/28/2015 1311   K 3.6 10/28/2015 1311   CL 103 10/28/2015 1311   CO2 23 10/28/2015 1311   GLUCOSE 176 (H) 10/28/2015 1311   BUN 8  10/28/2015 1311   CREATININE 0.92 10/28/2015 1311   CALCIUM 9.5 10/28/2015 1311   PROT 7.3 10/06/2009 1040   ALBUMIN 3.9 10/06/2009 1040   AST 22 10/06/2009 1040   ALT 22 10/06/2009 1040   ALKPHOS 120 (H) 10/06/2009 1040   BILITOT 0.6 10/06/2009 1040   GFRNONAA >60 10/28/2015 1311   GFRAA >60 10/28/2015 1311    No results found for: TOTALPROTELP, ALBUMINELP, A1GS, A2GS, BETS, BETA2SER, GAMS, MSPIKE, SPEI  No results found for: KPAFRELGTCHN, LAMBDASER, KAPLAMBRATIO  Lab Results  Component Value Date   WBC 11.5 (H) 11/02/2015   NEUTROABS 3.6 01/20/2010   HGB 9.9 (L) 11/02/2015   HCT 30.5 (L) 11/02/2015   MCV 83.3 11/02/2015   PLT 252 11/02/2015      Chemistry      Component Value Date/Time   NA 137 10/28/2015 1311   K 3.6 10/28/2015 1311  CL 103 10/28/2015 1311   CO2 23 10/28/2015 1311   BUN 8 10/28/2015 1311   CREATININE 0.92 10/28/2015 1311      Component Value Date/Time   CALCIUM 9.5 10/28/2015 1311   ALKPHOS 120 (H) 10/06/2009 1040   AST 22 10/06/2009 1040   ALT 22 10/06/2009 1040   BILITOT 0.6 10/06/2009 1040       No results found for: LABCA2  No components found for: [id number]  No results for input(s): INR in the last 168 hours.  Urinalysis    Component Value Date/Time   COLORURINE YELLOW 01/20/2010 1045   APPEARANCEUR CLEAR 01/20/2010 1045   LABSPEC 1.016 01/20/2010 1045   PHURINE 6.0 01/20/2010 1045   GLUCOSEU NEGATIVE 10/06/2009 1040   HGBUR NEGATIVE 01/20/2010 [place] 01/20/2010 [place] 01/20/2010 1045   PROTEINUR NEGATIVE 01/20/2010 1045   UROBILINOGEN 0.2 01/20/2010 1045   NITRITE NEGATIVE 01/20/2010 1045   LEUKOCYTESUR SMALL (A) 01/20/2010 1045     STUDIES: Next mammography due September 2018  ELIGIBLE FOR AVAILABLE RESEARCH PROTOCOL: no   ASSESSMENT: 56 y.o. Lindsay Pacheco, [place] woman status post left breast biopsy 2 (overlapping sites) 09/30/2015 for invasive ductal carcinoma, grade 1, estrogen  receptor and progesterone receptor strongly positive, HER-2 nonamplified.  (1) status post left lumpectomy 11/02/2015 for a pT2 pN0, stage IIA (2017 staging) invasive ductal carcinoma, again estrogen and progesterone receptor positive, HER-2 not amplified, with negative margins  (a) restaged as IB in the 2018 prognostic classification     (2) Oncotype score of 18 predicts a 10 year risk of recurrence outside the breast of 12% if the patient's only systemic therapy is tamoxifen for 5 years. It also predicts no significant benefit from chemotherapy  (3) Adjuvant radiation 12/22/15 - 02/11/16 Site/dose:    1) Left breast: 50.4 Gy in 28 fractions 2) Left breast boost: 10 Gy in 5 fractions  (4) letrozole started 03/03/2016  (a) DEXA scan 12/07/2015 at [facility] showed a T score of -0.8  PLAN: We spent the better part of today's 50 minute appointment discussing the biology of breast cancer in general, and the specifics of the patient's tumor in particular. We first reviewed the fact that cancer is not one disease but more than 100 different diseases and that it is important to keep them separate-- otherwise when friends and relatives discuss their own cancer experiences with Lindsay Pacheco confusion can result. We discussed the difference between local and systemic therapy and reviewed the local treatments that since he has had, namely surgery and radiation.  We then discussed the rationale for systemic therapy. There is some risk that this cancer may have already spread to other parts of her body. That risk is best quantified by the Oncotype test, which tells [place] in the absence of chemotherapy if she takes tamoxifen for 5 years she will have an 88% chance of not developing cancer outside the breast in the next 10 years.  However Lindsay Pacheco has been started on letrozole. This is marginally better than tamoxifen, by 2-3%, so she will have a 90 or 91% chance of this breast cancer not recurring outside the breast within  the next 10 years.  Her risk of local recurrence is even lower because of radiation as well as the letrozole. That risk is probably in the 5% range.  She is tolerating the letrozole well and she has a normal bone density at baseline. The fact that she is obese actually will help as far as  her bones are concerned. The plan therefore will be to continue letrozole for total of 5 years  I encouraged her to visit the [facility] and encouraged them to start a Livestrong program. 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 I will see her again in October and if she is still tolerating letrozole while I will start seeing her on a once a year basis from that point  Lindsay Pacheco has a good understanding of the overall plan. She agrees with it. She knows the goal of treatment in her case is cure. She will call with any problems that may develop before her next visit here.  Chauncey Cruel, MD   04/07/2016 4:24 PM Medical Oncology and Hematology [facility] [address] [place], [place] [address] Tel. [phone]    Fax. [phone]

## 2016-04-08 NOTE — Progress Notes (Signed)
[place] MD/PA/NP OP Progress Note  04/22/2016 9:13 AM Lindsay Pacheco  MRN:  [id number]  Chief Complaint:  Chief Complaint    Depression; Follow-up     Subjective:  "I am feeling calmer" HPI:  - Per chart review, Patient completed radiotherapy in 02/2016. She was started on letrozole 03/03/2016.  She presents for follow up appointment. She states that she feels calmer since the last appointment. She talks about stress of her mother, who she has had discordance; her mother had fractured L2 and has been bedridden. She also talks about her daughter who causes major stress as well as her sister. She talks of financial strain due to her treatment. She reports insomnia due to sleep apnea. She could not wear oxygen mask, and is planning to see a specialist for her sleep apnea. Se denies SI. She denies tremors. She reports occasional headache and rhinorrhea.   Wt Readings from Last 3 Encounters:  04/22/16 267 lb (121.1 kg)  04/07/16 270 lb 4.8 oz (122.6 kg)  03/31/16 270 lb (122.5 kg)   -  Lab Results  Component Value Date   VALPROATE 50.7 04/18/2016   Visit Diagnosis:    ICD-9-CM ICD-10-CM   1. Moderate episode of recurrent major depressive disorder ([facility] [place]) 296.32 F33.1     Past Psychiatric History:  Outpatient: used to be seen at [facility] for counseling Psychiatry admission: [facility] in 2012 for depression, SI of overdosing Ativan, IOP Previous suicide attempt: SI of overdosing Ativan in 2012, no SIB Past trials of medication: Paxil (felt numb), Effexor, Abilify, Klonopin,    Past Medical History:  Past Medical History:  Diagnosis Date  . Anxiety   . Arthritis    "knees" (11/02/2015)  . Breast cancer, left breast ([place]) 09/30/2015  . Depression   . Diabetes mellitus, type II ([place])   . Diverticulitis 1987  . Hypertension   . Irritable bowel syndrome (IBS)   . Sleep apnea    does not use CPAP but "I'm suppose to" (11/02/2015)    Past Surgical History:  Procedure Laterality  Date  . BREAST BIOPSY Left 09/2015  . BREAST LUMPECTOMY WITH AXILLARY LYMPH NODE BIOPSY Left 11/02/2015   BREAST LUMPECTOMY WITH BRACKETED RADIOACTIVE SEED AND LEFT AXILLARY SENTINEL LYMPH NODE BIOPSY   . BREAST LUMPECTOMY WITH RADIOACTIVE SEED AND SENTINEL LYMPH NODE BIOPSY Left 11/02/2015   Procedure: LEFT BREAST LUMPECTOMY WITH BRACKETED RADIOACTIVE SEED AND LEFT AXILLARY SENTINEL LYMPH NODE BIOPSY;  Surgeon: [name], MD;  Location: [facility];  Service: General;  Laterality: Left;  . COLONOSCOPY N/A 12/09/2015   Procedure: COLONOSCOPY;  Surgeon: [name], MD;  [facility];  Service: Endoscopy;  Laterality: N/A;  1:00  . EVACUATION BREAST HEMATOMA Left 11/02/2015  . EVACUATION BREAST HEMATOMA Left 11/02/2015   Procedure: EVACUATION HEMATOMA BREAST;  Surgeon: [name], MD;  Location: [facility];  Service: General;  Laterality: Left;  . JOINT REPLACEMENT    . KNEE ARTHROSCOPY Right 2000s  . LAPAROSCOPIC CHOLECYSTECTOMY  1995  . POLYPECTOMY  12/09/2015   Procedure: POLYPECTOMY;  Surgeon: [name], MD;  [facility];  Service: Endoscopy;;  multiple  . TOTAL KNEE ARTHROPLASTY Right 2011    Family Psychiatric History:  father- anxiety, depression, paternal grandmother- depression,  sister- depression, anxiety  Family History:  Family History  Problem Relation Age of Onset  . Anxiety disorder Father   . Depression Father   . Other Father  Abestosis  . Depression Sister   . Diabetes Mother     Social History:  Social History   Social History  . Marital status: Divorced    Spouse name: N/A  . Number of children: N/A  . Years of education: N/A   Social History Main Topics  . Smoking status: Never Smoker  . Smokeless tobacco: Never Used  . Alcohol use Yes     Comment: 11/02/2015 "maybe 4 drinks/year"  . Drug use: Yes    Types: Marijuana     Comment: 11/02/2015 "drug use in my past; now  maybe 2 puffs 3-4 times/year; for social anxiety"  . Sexual activity: Not Currently   Other Topics Concern  . None   Social History Narrative  . None   On disability for chronic pain secondary to arthritis, depression, anxiety She used to work at [employer] until 2011 when she was admitted Education: graduated from [employer], college, no degrees,  Divorced in 1997, has one daughter She lives with her mother  Allergies:  Allergies  Allergen Reactions  . Adhesive [Tape] Rash  . Sulfa Antibiotics Nausea And Vomiting    Metabolic Disorder Labs: No results found for: HGBA1C, MPG No results found for: PROLACTIN No results found for: CHOL, TRIG, HDL, CHOLHDL, VLDL, LDLCALC    Labs in 08/2015 CBC, CMP wnl, Lipid LDL 115, TSH 0.691 (see scan)  Current Medications: Current Outpatient Prescriptions  Medication Sig Dispense Refill  . acetaminophen (TYLENOL) 500 MG tablet Take 1,000 mg by mouth every 6 (six) hours as needed for moderate pain or headache.    Marland Kitchen atorvastatin (LIPITOR) 10 MG tablet Take 10 mg by mouth daily.    . divalproex (DEPAKOTE ER) 250 MG 24 hr tablet Take 3 tablets (750 mg total) by mouth at bedtime. 90 tablet 1  . docusate sodium (COLACE) 100 MG capsule Take 1 capsule (100 mg total) by mouth 2 (two) times daily. 10 capsule 0  . DULoxetine (CYMBALTA) 60 MG capsule Take 1 capsule (60 mg total) by mouth daily. 30 capsule 1  . letrozole (FEMARA) 2.5 MG tablet Take 1 tablet (2.5 mg total) by mouth daily. 90 tablet 3  . lisinopril-hydrochlorothiazide (PRINZIDE,ZESTORETIC) 20-12.5 MG tablet Take 1 tablet by mouth daily.  6  . metFORMIN (GLUCOPHAGE) 500 MG tablet Take 500 mg by mouth 2 (two) times daily with a meal.     . psyllium (METAMUCIL SMOOTH TEXTURE) 28 % packet Take 1 packet by mouth at bedtime.     No current facility-administered medications for this visit.     Neurologic: Headache: Yes Seizure: No Paresthesias: No  Musculoskeletal: Strength &  Muscle Tone: within normal limits Gait & Station: normal Patient leans: N/A  Psychiatric Specialty Exam: Review of Systems  Neurological: Positive for tingling and headaches.  Psychiatric/Behavioral: Positive for depression. Negative for hallucinations, substance abuse and suicidal ideas. The patient is nervous/anxious and has insomnia.   All other systems reviewed and are negative.   Blood pressure 115/75, pulse 95, height 5\' 7"  (1.702 m), weight 267 lb (121.1 kg).Body mass index is 41.82 kg/m.  General Appearance: Well Groomed  Eye Contact:  Good  Speech:  Clear and Coherent  Volume:  Normal  Mood:  "calmer"  Affect:  Appropriate, Congruent and smiles at times  Thought Process:  Coherent and Goal Directed  Orientation:  Full (Time, Place, and Person)  Thought Content: Logical  Perceptions: denies AH/VH  Suicidal Thoughts:  No  Homicidal Thoughts:  No  Memory:  Immediate;   Good Recent;   Good Remote;   Good  Judgement:  Good  Insight:  Fair  Psychomotor Activity:  Normal  Concentration:  Concentration: Good and Attention Span: Good  Recall:  Good  Fund of Knowledge: Good  Language: Good  Akathisia:  No  Handed:  Right  AIMS (if indicated):  N/A  Assets:  Communication Skills Desire for Improvement  ADL's:  Intact  Cognition: WNL  Sleep:  poor   Assessment Lindsay Pacheco is a 56 year old female with depression, diabetes, hypertension, left breast cancer s/o bracketed lumpectomy, radiotherapy, on letrozole since 03/03/2016 and irritable bowel syndrome, who presented for follow up appointment for depression. Psychosocial stressors including her mother, daughter and her sister.  # MDD # r/o borderline personality disorder There has been significant improvement in her mood/irritability since starting Depakote. Will uptitrate further to therapeutic level. Obtain blood test to monitor level/side effect. Noted that although she will greatly benefit from DBT given her cluster  B traits, she is not amenable to this option due to financial stress.   Plan 1. Increase Depakote 1000 mg at night 2. Continue duloxetine 60 mg daily 3. Return to clinic in 3 months- advised patient to call the clinic for earlier appointment if any worsening in her symptoms 4. Obtain labs after 7 days after uptitration of Depakote (VPA, CBC, CMP)  Treatment Plan Summary: Plan as above  The patient demonstrates the following risk factors for suicide: Chronic risk factors for suicide include: psychiatric disorder of depression, substance use disorder, previous suicide attempts overdosing ativan and chronic pain. Acute risk factors for suicide include: family or marital conflict, unemployment, social withdrawal/isolation and loss (financial, interpersonal, professional). Protective factors for this patient include: coping skills and hope for the future. Considering these factors, the overall suicide risk at this point appears to be low. Patient is appropriate for outpatient follow up.  Norman Clay, MD 04/22/2016, 9:13 AM

## 2016-04-18 ENCOUNTER — Ambulatory Visit (HOSPITAL_COMMUNITY): Payer: Self-pay | Admitting: Adult Health

## 2016-04-18 ENCOUNTER — Other Ambulatory Visit (HOSPITAL_COMMUNITY): Payer: Self-pay

## 2016-04-19 ENCOUNTER — Other Ambulatory Visit (HOSPITAL_COMMUNITY): Payer: Self-pay

## 2016-04-19 ENCOUNTER — Ambulatory Visit (HOSPITAL_COMMUNITY): Payer: Self-pay | Admitting: Adult Health

## 2016-04-19 LAB — VALPROIC ACID LEVEL: Valproic Acid Lvl: 50.7 ug/mL (ref 50.0–100.0)

## 2016-04-22 ENCOUNTER — Encounter (HOSPITAL_COMMUNITY): Payer: Self-pay | Admitting: Psychiatry

## 2016-04-22 ENCOUNTER — Ambulatory Visit (INDEPENDENT_AMBULATORY_CARE_PROVIDER_SITE_OTHER): Payer: Medicare Other | Admitting: Psychiatry

## 2016-04-22 VITALS — BP 115/75 | HR 95 | Ht 67.0 in | Wt 267.0 lb

## 2016-04-22 DIAGNOSIS — F1290 Cannabis use, unspecified, uncomplicated: Secondary | ICD-10-CM

## 2016-04-22 DIAGNOSIS — Z818 Family history of other mental and behavioral disorders: Secondary | ICD-10-CM

## 2016-04-22 DIAGNOSIS — Z79899 Other long term (current) drug therapy: Secondary | ICD-10-CM

## 2016-04-22 DIAGNOSIS — F331 Major depressive disorder, recurrent, moderate: Secondary | ICD-10-CM

## 2016-04-22 MED ORDER — DIVALPROEX SODIUM ER 500 MG PO TB24: 1000.0000 mg | ORAL_TABLET | Freq: Every day | ORAL | 2 refills | Status: DC

## 2016-04-22 MED ORDER — DULOXETINE HCL 60 MG PO CPEP: 60.0000 mg | ORAL_CAPSULE | Freq: Every day | ORAL | 2 refills | Status: DC

## 2016-04-22 NOTE — Patient Instructions (Signed)
1. Increase Depakote 1000 mg at night 2. Continue duloxetine 60 mg daily 3. Return to clinic in 3 months 4. Obtain blood test- after 7 days after increasing your depakote

## 2016-05-03 ENCOUNTER — Other Ambulatory Visit: Payer: Self-pay

## 2016-05-03 ENCOUNTER — Encounter: Payer: Self-pay | Admitting: Genetics

## 2016-07-12 ENCOUNTER — Telehealth (HOSPITAL_COMMUNITY): Payer: Self-pay | Admitting: *Deleted

## 2016-07-12 NOTE — Telephone Encounter (Signed)
She should have enough meds until the next appointment. Will hold refill until she has another evaluation.

## 2016-07-12 NOTE — Telephone Encounter (Signed)
noted 

## 2016-07-12 NOTE — Telephone Encounter (Signed)
Pt pharmacy [facility] requesting refills for pt Duloxetine HCL DR 60 mg QD. Pt medication was last filled on 04-22-2016 with 30 tabs 2 refills. Pt pharmacy number is [phone]. Pt f/u appt is 07-18-2016.

## 2016-07-13 NOTE — Progress Notes (Signed)
[place] MD/PA/NP OP Progress Note  07/18/2016 9:30 AM Lindsay Pacheco  MRN:  [id number]  Chief Complaint:  Chief Complaint    Depression; Follow-up     Subjective:  "I'm just not a happy person" HPI:  Patient presents for follow up appointment. She states that she has no change since the last appointment. She has history of rage, which is uncontrollable, although she feels that her anger is not as quick as used to. She talks about discordance with her daughter, and her mother who always "pokes" her in her brain. She talks about her friends who did not invite her to a friend trip. She is hoping to go back to church. She reports that she never has good self esteem, and feels abandoned very easily. She reports that she did not have good connection with her parents growing up (her father was "distant"), although she reports good relationship with her grandmother, who she liked.   She reports insomnia. She has sleep apnea and trying to help appointment for that. She has increased appetite, and craves for sweets. She endorses fatigue. She tends to stay in the bed most of the time. She has anhedonia, although she sometimes enjoys watching TV. She has passive SI. She denies HI, AH, VH. She feels anxious. She denies panic attacks.   Wt Readings from Last 3 Encounters:  07/18/16 277 lb 3.2 oz (125.7 kg)  04/22/16 267 lb (121.1 kg)  04/07/16 270 lb 4.8 oz (122.6 kg)    Visit Diagnosis:    ICD-10-CM   1. Moderate episode of recurrent major depressive disorder (HCC) F33.1 DULoxetine (CYMBALTA) 60 MG capsule    Past Psychiatric History:   I have reviewed the patient's psychiatry history in detail and updated the patient record. Outpatient: used to be seen at [facility] for counseling Psychiatry admission: [facility] in 2012 for depression, SI of overdosing Ativan, IOP Previous suicide attempt:SI of overdosing Ativan in 2012, no SIB Past trials of medication: Paxil (felt numb), Effexor, Abilify,  Klonopin,   Past Medical History:  Past Medical History:  Diagnosis Date  . Anxiety   . Arthritis    "knees" (11/02/2015)  . Breast cancer, left breast ([place]) 09/30/2015  . Depression   . Diabetes mellitus, type II ([place])   . Diverticulitis 1987  . Hypertension   . Irritable bowel syndrome (IBS)   . Sleep apnea    does not use CPAP but "I'm suppose to" (11/02/2015)    Past Surgical History:  Procedure Laterality Date  . BREAST BIOPSY Left 09/2015  . BREAST LUMPECTOMY WITH AXILLARY LYMPH NODE BIOPSY Left 11/02/2015   BREAST LUMPECTOMY WITH BRACKETED RADIOACTIVE SEED AND LEFT AXILLARY SENTINEL LYMPH NODE BIOPSY   . BREAST LUMPECTOMY WITH RADIOACTIVE SEED AND SENTINEL LYMPH NODE BIOPSY Left 11/02/2015   Procedure: LEFT BREAST LUMPECTOMY WITH BRACKETED RADIOACTIVE SEED AND LEFT AXILLARY SENTINEL LYMPH NODE BIOPSY;  Surgeon: [name], MD;  Location: [place];  Service: General;  Laterality: Left;  . COLONOSCOPY N/A 12/09/2015   Procedure: COLONOSCOPY;  Surgeon: [name], MD;  [facility];  Service: Endoscopy;  Laterality: N/A;  1:00  . EVACUATION BREAST HEMATOMA Left 11/02/2015  . EVACUATION BREAST HEMATOMA Left 11/02/2015   Procedure: EVACUATION HEMATOMA BREAST;  Surgeon: [name], MD;  Location: [place];  Service: General;  Laterality: Left;  . JOINT REPLACEMENT    . KNEE ARTHROSCOPY Right 2000s  . LAPAROSCOPIC CHOLECYSTECTOMY  1995  . POLYPECTOMY  12/09/2015  Procedure: POLYPECTOMY;  Surgeon: [name], MD;  [facility];  Service: Endoscopy;;  multiple  . TOTAL KNEE ARTHROPLASTY Right 2011    Family Psychiatric History:  I have reviewed the patient's family history in detail and updated the patient record. father- anxiety, depression, paternal grandmother- depression, sister- depression, anxiety  Family History:  Family History  Problem Relation Age of Onset  . Anxiety disorder Father    . Depression Father   . Other Father        Abestosis  . Depression Sister   . Diabetes Mother     Social History:  Social History   Social History  . Marital status: Divorced    Spouse name: N/A  . Number of children: N/A  . Years of education: N/A   Social History Main Topics  . Smoking status: Never Smoker  . Smokeless tobacco: Never Used  . Alcohol use Yes     Comment: 11/02/2015 "maybe 4 drinks/year"  . Drug use: Yes    Types: Marijuana     Comment: 11/02/2015 "drug use in my past; now maybe 2 puffs 3-4 times/year; for social anxiety"  . Sexual activity: Not Currently   Other Topics Concern  . None   Social History Narrative  . None    Allergies:  Allergies  Allergen Reactions  . Adhesive [Tape] Rash  . Sulfa Antibiotics Nausea And Vomiting    Metabolic Disorder Labs: No results found for: HGBA1C, MPG No results found for: PROLACTIN No results found for: CHOL, TRIG, HDL, CHOLHDL, VLDL, LDLCALC   Current Medications: Current Outpatient Prescriptions  Medication Sig Dispense Refill  . acetaminophen (TYLENOL) 500 MG tablet Take 1,000 mg by mouth every 6 (six) hours as needed for moderate pain or headache.    Marland Kitchen atorvastatin (LIPITOR) 10 MG tablet Take 10 mg by mouth daily.    Marland Kitchen docusate sodium (COLACE) 100 MG capsule Take 1 capsule (100 mg total) by mouth 2 (two) times daily. 10 capsule 0  . DULoxetine (CYMBALTA) 60 MG capsule Take 1 capsule (60 mg total) by mouth daily. 30 capsule 2  . letrozole (FEMARA) 2.5 MG tablet Take 1 tablet (2.5 mg total) by mouth daily. 90 tablet 3  . lisinopril-hydrochlorothiazide (PRINZIDE,ZESTORETIC) 20-12.5 MG tablet Take 1 tablet by mouth daily.  6  . metFORMIN (GLUCOPHAGE) 500 MG tablet Take 500 mg by mouth 2 (two) times daily with a meal.     . psyllium (METAMUCIL SMOOTH TEXTURE) 28 % packet Take 1 packet by mouth at bedtime.    . ARIPiprazole (ABILIFY) 2 MG tablet Take 1 tablet (2 mg total) by mouth daily. 30 tablet 2  .  divalproex (DEPAKOTE ER) 250 MG 24 hr tablet Take 3 tablets (750 mg total) by mouth daily. 90 tablet 2   No current facility-administered medications for this visit.     Neurologic: Headache: No Seizure: No Paresthesias: No  Musculoskeletal: Strength & Muscle Tone: within normal limits Gait & Station: normal Patient leans: N/A  Psychiatric Specialty Exam: Review of Systems  Musculoskeletal: Positive for back pain.  Psychiatric/Behavioral: Positive for depression. Negative for hallucinations, substance abuse and suicidal ideas. The patient is nervous/anxious and has insomnia.   All other systems reviewed and are negative.   Blood pressure 124/80, pulse (!) 104, height 5\' 7"  (1.702 m), weight 277 lb 3.2 oz (125.7 kg), SpO2 93 %.Body mass index is 43.42 kg/m.  General Appearance: Fairly Groomed  Eye Contact:  Good  Speech:  Clear and Coherent  Volume:  Normal  Mood:  Irritable  Affect:  Labile and Tearful  Thought Process:  Coherent and Linear  Orientation:  Full (Time, Place, and Person)  Thought Content: Logical Perceptions: denies AH/VH  Suicidal Thoughts:  Yes.  without intent/plan  Homicidal Thoughts:  No  Memory:  Immediate;   Good Recent;   Good Remote;   Good  Judgement:  Fair  Insight:  Fair  Psychomotor Activity:  Normal  Concentration:  Concentration: Good and Attention Span: Good  Recall:  Good  Fund of Knowledge: Good  Language: Good  Akathisia:  No  Handed:  Right  AIMS (if indicated):  N/A  Assets:  Communication Skills Desire for Improvement  ADL's:  Intact  Cognition: WNL  Sleep:  poor   Assessment Lindsay Pacheco is a 56 y.o. year old female with a history of depression, diabetes, hypertension, left breast cancer s/p bracketed lumpectomy, radiotherapy on letrozole since 03/03/2016 and irritable bower syndrome, who presents for follow up appointment for Moderate episode of recurrent major depressive disorder ([place]) - Plan: DULoxetine (CYMBALTA) 60 MG  capsule   Psychosocial stressors including her mother, daughter and her sister.  # MDD, moderate, recurrent without psychotic features # r/o borderline personality disorder Exam is notable for he mood lability, and she has limited effect from Depakote uptitration (plus gained weight). Will decrease back Depakote to target her mood stability. Will start Abilify for mood dysregulation. Discussed risk of metabolic side effect. Will continue duloxetine for depression. Patient does have cluster B traits, and will greatly benefit from DBT; referral information is provided (patient is unable to continue therapy here due to financial strain). Although she will benefit from monthly appointment, will do three months given financial strain. She is advised to come earlier appointment if any worsening in her symptoms.   Plan 1. Decrease Depakote 750 mg at night 2. Continue duloxetine 60 mg daily 3. Start Abilify 2 mg daily 4. Return to clinic in three months for 30 mins  5. [facility] in [address] [place], [place], [place] [address]  The patient demonstrates the following risk factors for suicide: Chronic risk factors for suicide include: psychiatric disorder of depression, substance use disorder, previous suicide attempts overdosing ativanand chronic pain. Acute risk factorsfor suicide include: family or marital conflict, unemployment, social withdrawal/isolation and loss (financial, interpersonal, professional). Protective factorsfor this patient include: coping skills and hope for the future. Considering these factors, the overall suicide risk at this point appears to be low. Patient isappropriate for outpatient follow up.  Treatment Plan Summary:Plan as above  The duration of this appointment visit was 30 minutes of face-to-face time with the patient.  Greater than 50% of this time was spent in counseling, explanation of  diagnosis, planning of  further management, and coordination of care.  [place] Clay, MD 07/18/2016, 9:30 AM

## 2016-07-15 LAB — CBC
HEMATOCRIT: 40.4 % (ref 35.0–45.0)
HEMOGLOBIN: 13.2 g/dL (ref 11.7–15.5)
MCH: 28 pg (ref 27.0–33.0)
MCHC: 32.7 g/dL (ref 32.0–36.0)
MCV: 85.8 fL (ref 80.0–100.0)
MPV: 10.7 fL (ref 7.5–12.5)
Platelets: 286 10*3/uL (ref 140–400)
RBC: 4.71 MIL/uL (ref 3.80–5.10)
RDW: 13.5 % (ref 11.0–15.0)
WBC: 8.6 10*3/uL (ref 3.8–10.8)

## 2016-07-16 LAB — COMPREHENSIVE METABOLIC PANEL
ALBUMIN: 4 g/dL (ref 3.6–5.1)
ALK PHOS: 111 U/L (ref 33–130)
ALT: 12 U/L (ref 6–29)
AST: 13 U/L (ref 10–35)
BILIRUBIN TOTAL: 0.4 mg/dL (ref 0.2–1.2)
BUN: 12 mg/dL (ref 7–25)
CALCIUM: 9.3 mg/dL (ref 8.6–10.4)
CO2: 30 mmol/L (ref 20–31)
Chloride: 99 mmol/L (ref 98–110)
Creat: 0.85 mg/dL (ref 0.50–1.05)
GLUCOSE: 213 mg/dL — AB (ref 65–99)
Potassium: 4.4 mmol/L (ref 3.5–5.3)
Sodium: 138 mmol/L (ref 135–146)
TOTAL PROTEIN: 6.7 g/dL (ref 6.1–8.1)

## 2016-07-16 LAB — VALPROIC ACID LEVEL: Valproic Acid Lvl: 48 mg/L — ABNORMAL LOW (ref 50.0–100.0)

## 2016-07-18 ENCOUNTER — Ambulatory Visit (INDEPENDENT_AMBULATORY_CARE_PROVIDER_SITE_OTHER): Payer: Medicare Other | Admitting: Psychiatry

## 2016-07-18 ENCOUNTER — Encounter (HOSPITAL_COMMUNITY): Payer: Self-pay | Admitting: Psychiatry

## 2016-07-18 VITALS — BP 124/80 | HR 104 | Ht 67.0 in | Wt 277.2 lb

## 2016-07-18 DIAGNOSIS — Z818 Family history of other mental and behavioral disorders: Secondary | ICD-10-CM | POA: Diagnosis not present

## 2016-07-18 DIAGNOSIS — F1290 Cannabis use, unspecified, uncomplicated: Secondary | ICD-10-CM | POA: Diagnosis not present

## 2016-07-18 DIAGNOSIS — F331 Major depressive disorder, recurrent, moderate: Secondary | ICD-10-CM | POA: Diagnosis not present

## 2016-07-18 DIAGNOSIS — R45851 Suicidal ideations: Secondary | ICD-10-CM

## 2016-07-18 DIAGNOSIS — G4700 Insomnia, unspecified: Secondary | ICD-10-CM | POA: Diagnosis not present

## 2016-07-18 MED ORDER — ARIPIPRAZOLE 2 MG PO TABS: 2.0000 mg | ORAL_TABLET | Freq: Every day | ORAL | 2 refills | Status: DC

## 2016-07-18 MED ORDER — DIVALPROEX SODIUM ER 250 MG PO TB24: 750.0000 mg | ORAL_TABLET | Freq: Every day | ORAL | 2 refills | Status: DC

## 2016-07-18 MED ORDER — DULOXETINE HCL 60 MG PO CPEP: 60.0000 mg | ORAL_CAPSULE | Freq: Every day | ORAL | 2 refills | Status: DC

## 2016-07-18 NOTE — Patient Instructions (Addendum)
1. Decrease depakote 750 mg at night 2. Continue duloxetine 60 mg daily 3. Start abilify 2 mg daily 4. Return to clinic in three months for 30 mins 5. Contact [facility] in [address] [place], [place], [place] [address]

## 2016-09-28 NOTE — Progress Notes (Deleted)
[place] MD/PA/NP OP Progress Note  09/28/2016 12:28 PM Lindsay Pacheco  MRN:  [id number]  Chief Complaint:  HPI: *** Visit Diagnosis: No diagnosis found.  Past Psychiatric History:  I have reviewed the patient's psychiatry history in detail and updated the patient record. Outpatient: used to be seen at [facility] for counseling Psychiatry admission: [facility] in 2012 for depression, SI of overdosing Ativan, IOP Previous suicide attempt:SI of overdosing Ativan in 2012, no SIB Past trials of medication: Paxil (felt numb), Effexor, Abilify, Klonopin,   Past Medical History:  Past Medical History:  Diagnosis Date  . Anxiety   . Arthritis    "knees" (11/02/2015)  . Breast cancer, left breast (Lindsay Pacheco) 09/30/2015  . Depression   . Diabetes mellitus, type II (Lindsay Pacheco)   . Diverticulitis 1987  . Hypertension   . Irritable bowel syndrome (IBS)   . Sleep apnea    does not use CPAP but "I'm suppose to" (11/02/2015)    Past Surgical History:  Procedure Laterality Date  . BREAST BIOPSY Left 09/2015  . BREAST LUMPECTOMY WITH AXILLARY LYMPH NODE BIOPSY Left 11/02/2015   BREAST LUMPECTOMY WITH BRACKETED RADIOACTIVE SEED AND LEFT AXILLARY SENTINEL LYMPH NODE BIOPSY   . BREAST LUMPECTOMY WITH RADIOACTIVE SEED AND SENTINEL LYMPH NODE BIOPSY Left 11/02/2015   Procedure: LEFT BREAST LUMPECTOMY WITH BRACKETED RADIOACTIVE SEED AND LEFT AXILLARY SENTINEL LYMPH NODE BIOPSY;  Surgeon: [name], MD;  Location: [place];  Service: General;  Laterality: Left;  . COLONOSCOPY N/A 12/09/2015   Procedure: COLONOSCOPY;  Surgeon: [name], MD;  [facility];  Service: Endoscopy;  Laterality: N/A;  1:00  . EVACUATION BREAST HEMATOMA Left 11/02/2015  . EVACUATION BREAST HEMATOMA Left 11/02/2015   Procedure: EVACUATION HEMATOMA BREAST;  Surgeon: [name], MD;  Location: [place];  Service: General;  Laterality: Left;  . JOINT REPLACEMENT    . KNEE  ARTHROSCOPY Right 2000s  . LAPAROSCOPIC CHOLECYSTECTOMY  1995  . POLYPECTOMY  12/09/2015   Procedure: POLYPECTOMY;  Surgeon: [name], MD;  [facility];  Service: Endoscopy;;  multiple  . TOTAL KNEE ARTHROPLASTY Right 2011    Family Psychiatric History:  I have reviewed the patient's family history in detail and updated the patient record.  Family History:  Family History  Problem Relation Age of Onset  . Anxiety disorder Father   . Depression Father   . Other Father        Abestosis  . Depression Sister   . Diabetes Mother     Social History:  Social History   Social History  . Marital status: Divorced    Spouse name: N/A  . Number of children: N/A  . Years of education: N/A   Social History Main Topics  . Smoking status: Never Smoker  . Smokeless tobacco: Never Used  . Alcohol use Yes     Comment: 11/02/2015 "maybe 4 drinks/year"  . Drug use: Yes    Types: Marijuana     Comment: 11/02/2015 "drug use in my past; now maybe 2 puffs 3-4 times/year; for social anxiety"  . Sexual activity: Not Currently   Other Topics Concern  . Not on file   Social History Narrative  . No narrative on file    Allergies:  Allergies  Allergen Reactions  . Adhesive [Tape] Rash  . Sulfa Antibiotics Nausea And Vomiting    Metabolic Disorder Labs: No results found for: HGBA1C, MPG No results found for: PROLACTIN No  results found for: CHOL, TRIG, HDL, CHOLHDL, VLDL, LDLCALC No results found for: TSH  Therapeutic Level Labs: No results found for: LITHIUM Lab Results  Component Value Date   VALPROATE 48.0 (L) 07/15/2016   VALPROATE 50.7 04/18/2016   No components found for:  CBMZ  Current Medications: Current Outpatient Prescriptions  Medication Sig Dispense Refill  . acetaminophen (TYLENOL) 500 MG tablet Take 1,000 mg by mouth every 6 (six) hours as needed for moderate pain or headache.    . ARIPiprazole (ABILIFY) 2 MG tablet Take 1 tablet (2 mg total)  by mouth daily. 30 tablet 2  . atorvastatin (LIPITOR) 10 MG tablet Take 10 mg by mouth daily.    . divalproex (DEPAKOTE ER) 250 MG 24 hr tablet Take 3 tablets (750 mg total) by mouth daily. 90 tablet 2  . docusate sodium (COLACE) 100 MG capsule Take 1 capsule (100 mg total) by mouth 2 (two) times daily. 10 capsule 0  . DULoxetine (CYMBALTA) 60 MG capsule Take 1 capsule (60 mg total) by mouth daily. 30 capsule 2  . letrozole (FEMARA) 2.5 MG tablet Take 1 tablet (2.5 mg total) by mouth daily. 90 tablet 3  . lisinopril-hydrochlorothiazide (PRINZIDE,ZESTORETIC) 20-12.5 MG tablet Take 1 tablet by mouth daily.  6  . metFORMIN (GLUCOPHAGE) 500 MG tablet Take 500 mg by mouth 2 (two) times daily with a meal.     . psyllium (METAMUCIL SMOOTH TEXTURE) 28 % packet Take 1 packet by mouth at bedtime.     No current facility-administered medications for this visit.      Musculoskeletal: Strength & Muscle Tone: within normal limits Gait & Station: normal Patient leans: N/A  Psychiatric Specialty Exam: ROS  There were no vitals taken for this visit.There is no height or weight on file to calculate BMI.  General Appearance: Fairly Groomed  Eye Contact:  Good  Speech:  Clear and Coherent  Volume:  Normal  Mood:  {BHH MOOD:22306}  Affect:  {Affect (PAA):22687}  Thought Process:  Coherent and Goal Directed  Orientation:  Full (Time, Place, and Person)  Thought Content: Logical   Suicidal Thoughts:  {ST/HT (PAA):22692}  Homicidal Thoughts:  {ST/HT (PAA):22692}  Memory:  Immediate;   Good Recent;   Good Remote;   Good  Judgement:  {Judgement (PAA):22694}  Insight:  {Insight (PAA):22695}  Psychomotor Activity:  Normal  Concentration:  Concentration: Good and Attention Span: Good  Recall:  Good  Fund of Knowledge: Good  Language: Good  Akathisia:  No  Handed:  Right  AIMS (if indicated): not done  Assets:  Communication Skills Desire for Improvement  ADL's:  Intact  Cognition: WNL  Sleep:   {BHH GOOD/FAIR/POOR:22877}   Screenings: PHQ2-9     Follow Up Appointment 15 from 03/31/2016 in [place] from 10/21/2015 in [facility]  PHQ-2 Total Score  0  (P) 2  PHQ-9 Total Score  -  (P) 11       Assessment and Plan:  Lindsay Pacheco is a 56 y.o. year old female with a history of depression, diabetes, hypertension, left breast cancer s/p bracketed lumpectomy, radiotherapy on letrozole since 03/03/2016 and irritable bower syndrome, who presents for follow up appointment for No diagnosis found.   Psychosocial stressors including her mother, daughter and her sister.  # MDD, moderate, recurrent without psychotic features # r/o borderline personality disorder  Exam is notable for he mood lability, and she has limited effect from Depakote uptitration (plus  gained weight). Will decrease back Depakote to target her mood stability. Will start Abilify for mood dysregulation. Discussed risk of metabolic side effect. Will continue duloxetine for depression. Patient does have cluster B traits, and will greatly benefit from DBT; referral information is provided (patient is unable to continue therapy here due to financial strain). Although she will benefit from monthly appointment, will do three months given financial strain. She is advised to come earlier appointment if any worsening in her symptoms.   Plan 1. Decrease Depakote 750 mg at night 2. Continue duloxetine 60 mg daily 3. Start Abilify 2 mg daily 4. Return to clinic in three months for 30 mins  5. [facility] [address], [place], [place] [address]  The patient demonstrates the following risk factors for suicide: Chronic risk factors for suicide include: psychiatric disorder of depression, substance use disorder, previous suicide attempts overdosing ativanand chronic pain. Acute risk  factorsfor suicide include: family or marital conflict, unemployment, social withdrawal/isolation and loss (financial, interpersonal, professional). Protective factorsfor this patient include: coping skills and hope for the future. Considering these factors, the overall suicide risk at this point appears to be low. Patient isappropriate for outpatient follow up.  [place] Clay, MD 09/28/2016, 12:28 PM

## 2016-10-04 ENCOUNTER — Ambulatory Visit (HOSPITAL_COMMUNITY): Payer: Medicare Other | Admitting: Psychiatry

## 2016-10-07 ENCOUNTER — Other Ambulatory Visit: Payer: Self-pay | Admitting: *Deleted

## 2016-10-07 DIAGNOSIS — Z170 Estrogen receptor positive status [ER+]: Principal | ICD-10-CM

## 2016-10-07 DIAGNOSIS — C50212 Malignant neoplasm of upper-inner quadrant of left female breast: Secondary | ICD-10-CM

## 2016-10-07 NOTE — Progress Notes (Signed)
[facility] MD/PA/NP OP Progress Note  10/11/2016 1:23 PM Lindsay Pacheco  MRN:  [id number]  Chief Complaint:  Chief Complaint    Follow-up; Depression     HPI:  Patient presents for follow up appointment for depression. She could not get Abilify due to cost issues. She discontinued Depakote more than two months ago with concern for weight gain; she has not noticed any difference. She talks about family discordance; she states that she is  "estranged" from family member and states "always on me (they accuse her of anything)." She states that mother has "put me down, " patronizing." Her sister will call her "crazy" and she does believe that "I am crazy." She agrees that she tends to be very sensitive to rejection. She has significant mood swing, often becomes irritable easily. She feels depressed. She enjoys going shopping with her daughter. She has passive SI. She feels anxious and has occasional panic attacks. She tends to eat unhealthy diet and has not done exercise due to knee pain.    Wt Readings from Last 3 Encounters:  10/11/16 279 lb 6.4 oz (126.7 kg)  07/18/16 277 lb 3.2 oz (125.7 kg)  04/22/16 267 lb (121.1 kg)    Visit Diagnosis:    ICD-10-CM   1. Moderate episode of recurrent major depressive disorder (HCC) F33.1 DULoxetine (CYMBALTA) 60 MG capsule    Past Psychiatric History:  I have reviewed the patient's psychiatry history in detail and updated the patient record. Outpatient: used to be seen at [facility] for counseling Psychiatry admission: [facility] in 2012 for depression, SI of overdosing Ativan, IOP Previous suicide attempt:SI of overdosing Ativan in 2012, no SIB Past trials of medication: Paxil (felt numb), Effexor, Depakote (limited effect), Abilify, Klonopin,   Past Medical History:  Past Medical History:  Diagnosis Date  . Anxiety   . Arthritis    "knees" (11/02/2015)  . Breast cancer, left breast ([employer]) 09/30/2015  . Depression   . Diabetes mellitus, type II  ([place])   . Diverticulitis 1987  . Hypertension   . Irritable bowel syndrome (IBS)   . Sleep apnea    does not use CPAP but "I'm suppose to" (11/02/2015)    Past Surgical History:  Procedure Laterality Date  . BREAST BIOPSY Left 09/2015  . BREAST LUMPECTOMY WITH AXILLARY LYMPH NODE BIOPSY Left 11/02/2015   BREAST LUMPECTOMY WITH BRACKETED RADIOACTIVE SEED AND LEFT AXILLARY SENTINEL LYMPH NODE BIOPSY   . BREAST LUMPECTOMY WITH RADIOACTIVE SEED AND SENTINEL LYMPH NODE BIOPSY Left 11/02/2015   Procedure: LEFT BREAST LUMPECTOMY WITH BRACKETED RADIOACTIVE SEED AND LEFT AXILLARY SENTINEL LYMPH NODE BIOPSY;  Surgeon: [name], MD;  Location: [place];  Service: General;  Laterality: Left;  . COLONOSCOPY N/A 12/09/2015   Procedure: COLONOSCOPY;  Surgeon: [name], MD;  [facility];  Service: Endoscopy;  Laterality: N/A;  1:00  . EVACUATION BREAST HEMATOMA Left 11/02/2015  . EVACUATION BREAST HEMATOMA Left 11/02/2015   Procedure: EVACUATION HEMATOMA BREAST;  Surgeon: [name], MD;  Location: [place];  Service: General;  Laterality: Left;  . JOINT REPLACEMENT    . KNEE ARTHROSCOPY Right 2000s  . LAPAROSCOPIC CHOLECYSTECTOMY  1995  . POLYPECTOMY  12/09/2015   Procedure: POLYPECTOMY;  Surgeon: [name], MD;  [facility];  Service: Endoscopy;;  multiple  . TOTAL KNEE ARTHROPLASTY Right 2011    Family Psychiatric History:  I have reviewed the patient's family history in detail and updated the  patient record. On disability for chronic pain secondary to arthritis, depression, anxiety She used to work at [employer] until 2011 when she was admitted Education: graduated from [employer], college, no degrees,  Divorced in 1997, has one daughter She lives with her mother  Family History:  Family History  Problem Relation Age of Onset  . Anxiety disorder Father   . Depression Father   . Other Father         Abestosis  . Depression Sister   . Diabetes Mother     Social History:  Social History   Social History  . Marital status: Divorced    Spouse name: N/A  . Number of children: N/A  . Years of education: N/A   Social History Main Topics  . Smoking status: Never Smoker  . Smokeless tobacco: Never Used  . Alcohol use Yes     Comment: 11/02/2015 "maybe 4 drinks/year"  . Drug use: Yes    Types: Marijuana     Comment: 11/02/2015 "drug use in my past; now maybe 2 puffs 3-4 times/year; for social anxiety"  . Sexual activity: Not Currently   Other Topics Concern  . Not on file   Social History Narrative  . No narrative on file    Allergies:  Allergies  Allergen Reactions  . Adhesive [Tape] Rash  . Sulfa Antibiotics Nausea And Vomiting    Metabolic Disorder Labs: No results found for: HGBA1C, MPG No results found for: PROLACTIN No results found for: CHOL, TRIG, HDL, CHOLHDL, VLDL, LDLCALC No results found for: TSH  Therapeutic Level Labs: No results found for: LITHIUM Lab Results  Component Value Date   VALPROATE 48.0 (L) 07/15/2016   VALPROATE 50.7 04/18/2016   No components found for:  CBMZ  Current Medications: Current Outpatient Prescriptions  Medication Sig Dispense Refill  . acetaminophen (TYLENOL) 500 MG tablet Take 1,000 mg by mouth every 6 (six) hours as needed for moderate pain or headache.    Marland Kitchen atorvastatin (LIPITOR) 10 MG tablet Take 10 mg by mouth daily.    Marland Kitchen docusate sodium (COLACE) 100 MG capsule Take 1 capsule (100 mg total) by mouth 2 (two) times daily. 10 capsule 0  . DULoxetine (CYMBALTA) 30 MG capsule 90 mg (60 mg + 30 mg) 30 capsule 2  . DULoxetine (CYMBALTA) 60 MG capsule 90 mg (60 mg + 30 mg) 30 capsule 2  . letrozole (FEMARA) 2.5 MG tablet Take 1 tablet (2.5 mg total) by mouth daily. 90 tablet 3  . lisinopril-hydrochlorothiazide (PRINZIDE,ZESTORETIC) 20-12.5 MG tablet Take 1 tablet by mouth daily.  6  . metFORMIN (GLUCOPHAGE) 500 MG tablet  Take 500 mg by mouth 2 (two) times daily with a meal.     . psyllium (METAMUCIL SMOOTH TEXTURE) 28 % packet Take 1 packet by mouth at bedtime.     No current facility-administered medications for this visit.      Musculoskeletal: Strength & Muscle Tone: within normal limits Gait & Station: normal Patient leans: N/A  Psychiatric Specialty Exam: Review of Systems  Musculoskeletal: Positive for joint pain.  Psychiatric/Behavioral: Positive for depression and suicidal ideas. Negative for hallucinations and substance abuse. The patient is nervous/anxious and has insomnia.   All other systems reviewed and are negative.   Blood pressure 114/78, pulse 100, height 5' 7.01" (1.702 m), weight 279 lb 6.4 oz (126.7 kg).Body mass index is 43.75 kg/m.  General Appearance: Fairly Groomed  Eye Contact:  Good  Speech:  Clear and Coherent  Volume:  Normal  Mood:  Anxious and Depressed  Affect:  Appropriate, Congruent and irritable  Thought Process:  Coherent and Goal Directed  Orientation:  Full (Time, Place, and Person)  Thought Content: Logical Perceptions: denies AH/VH  Suicidal Thoughts:  Yes.  without intent/plan  Homicidal Thoughts:  No  Memory:  Immediate;   Good Recent;   Good Remote;   Good  Judgement:  Fair  Insight:  Present  Psychomotor Activity:  Normal  Concentration:  Concentration: Good and Attention Span: Good  Recall:  Good  Fund of Knowledge: Good  Language: Good  Akathisia:  No  Handed:  Right  AIMS (if indicated): not done  Assets:  Communication Skills Desire for Improvement  ADL's:  Intact  Cognition: WNL  Sleep:  Poor   Screenings: PHQ2-9     Follow Up Appointment 15 from 03/31/2016 in [facility] from 10/21/2015 in [facility]  PHQ-2 Total Score  0  (P) 2  PHQ-9 Total Score  -  (P) 11       Assessment and Plan:  SHAR PAEZ is a 56 y.o. year old female with a  history of depression, diabetes, hypertension, left breast cancer s/p bracketed lumpectomy, radiotherapy on letrozole since 03/03/2016 and irritable bower syndrome, who presents for follow up appointment for Moderate episode of recurrent major depressive disorder ([place]) - Plan: DULoxetine (CYMBALTA) 60 MG capsule  # MDD, moderate, recurrent without psychotic features # r/o borderline personality disorder Although exam is notable for rumination on discordance with her family, she easily redirectable and reports some improvement in her mood symptoms. Will uptitrate duloxetine to target depression and anxiety. She self discontinued Depakote and has not tried Abilify due to cost issues. Noted that she demonstrates some insight into her pattern of black and white thinking. Discussed cognitive defusion. Explored her value and discussed behavioral activation. She agrees to be mindful of her diet, which enable her to get involve in activity and social life. Therapy information is provided again as below. Will do three months follow up given financial strain. She is advised to contact the clinic earlier as needed.   Plan 1. Continue duloxetine 90 mg (60 mg + 30 mg) daily 2. Return to clinic in three months for 30 mins [address] [facility] in [address], [place], [place] [address]  The patient demonstrates the following risk factors for suicide: Chronic risk factors for suicide include: psychiatric disorder of depression, substance use disorder, previous suicide attempts overdosing ativanand chronic pain. Acute risk factorsfor suicide include: family or marital conflict, unemployment, social withdrawal/isolation and loss (financial, interpersonal, professional). Protective factorsfor this patient include: coping skills and hope for the future. Considering these factors, the overall suicide risk at this point appears to be low. Patient isappropriate for  outpatient follow up.  The duration of this appointment visit was 30 minutes of face-to-face time with the patient.  Greater than 50% of this time was spent in counseling, explanation of  diagnosis, planning of further management, and coordination of care.  Norman Clay, MD 10/11/2016, 1:23 PM

## 2016-10-11 ENCOUNTER — Ambulatory Visit (INDEPENDENT_AMBULATORY_CARE_PROVIDER_SITE_OTHER): Payer: Medicare Other | Admitting: Psychiatry

## 2016-10-11 DIAGNOSIS — Z79899 Other long term (current) drug therapy: Secondary | ICD-10-CM | POA: Diagnosis not present

## 2016-10-11 DIAGNOSIS — Z915 Personal history of self-harm: Secondary | ICD-10-CM

## 2016-10-11 DIAGNOSIS — E119 Type 2 diabetes mellitus without complications: Secondary | ICD-10-CM | POA: Diagnosis not present

## 2016-10-11 DIAGNOSIS — K589 Irritable bowel syndrome without diarrhea: Secondary | ICD-10-CM | POA: Diagnosis not present

## 2016-10-11 DIAGNOSIS — Z818 Family history of other mental and behavioral disorders: Secondary | ICD-10-CM | POA: Diagnosis not present

## 2016-10-11 DIAGNOSIS — Z638 Other specified problems related to primary support group: Secondary | ICD-10-CM

## 2016-10-11 DIAGNOSIS — I10 Essential (primary) hypertension: Secondary | ICD-10-CM | POA: Diagnosis not present

## 2016-10-11 DIAGNOSIS — G4700 Insomnia, unspecified: Secondary | ICD-10-CM | POA: Diagnosis not present

## 2016-10-11 DIAGNOSIS — C50912 Malignant neoplasm of unspecified site of left female breast: Secondary | ICD-10-CM | POA: Diagnosis not present

## 2016-10-11 DIAGNOSIS — F331 Major depressive disorder, recurrent, moderate: Secondary | ICD-10-CM | POA: Diagnosis not present

## 2016-10-11 MED ORDER — DULOXETINE HCL 60 MG PO CPEP: ORAL_CAPSULE | ORAL | 2 refills | Status: DC

## 2016-10-11 MED ORDER — DULOXETINE HCL 30 MG PO CPEP: ORAL_CAPSULE | ORAL | 2 refills | Status: DC

## 2016-10-11 NOTE — Patient Instructions (Signed)
1. Continue duloxetine 90 mg (60 mg + 30 mg) daily 2. Return to clinic in three months for 30 mins [address] [facility] in [address] [place], [place], [place] [address]

## 2016-10-12 ENCOUNTER — Ambulatory Visit
Admission: RE | Admit: 2016-10-12 | Discharge: 2016-10-12 | Disposition: A | Discharge status: Home / Self Care | Payer: Medicare Other | Source: Ambulatory Visit | Attending: Oncology | Admitting: Oncology

## 2016-10-12 DIAGNOSIS — C50212 Malignant neoplasm of upper-inner quadrant of left female breast: Secondary | ICD-10-CM

## 2016-10-12 DIAGNOSIS — Z170 Estrogen receptor positive status [ER+]: Principal | ICD-10-CM

## 2016-10-12 NOTE — Progress Notes (Signed)
[facility]  Telephone:([phone]) [phone] Fax:([phone]) [phone]     ID: Lindsay Pacheco DOB: 09-23-1960  MR#: [id number]  OTR#:[id number]  Patient Care Team: Monico Blitz, MD as PCP - General (Internal Medicine) Magrinat, Virgie Dad, MD as Consulting Physician (Oncology) Kyung Rudd, MD as Consulting Physician (Radiation Oncology) [name], MD as Consulting Physician (General Surgery) [name], MD as Consulting Physician (Gastroenterology) Norman Clay, MD as Consulting Physician (Psychiatry) Gaynelle Arabian, MD as Consulting Physician (Orthopedic Surgery) OTHER MD:  CHIEF COMPLAINT: Estrogen receptor positive breast cancer  CURRENT TREATMENT: Letrozole   BREAST CANCER HISTORY: From the original intake note:  The patient had routine bilateral screening mammography 09/10/2015 showing apparent new right breast calcifications and an area of distortion and possible mass in the left breast. On 09/23/2015 she underwent bilateral diagnostic mammography and left breast ultrasonography. The breast density was category C. The right breast calcification were felt to be benign. However in the left breast there was a persistent area of distortion in the superior subareolar areolar area, with 2 or 3 small masses in the upper inner quadrant. On exam there was an area of firm tissue just above the nipple of the left breast. Ultrasonography confirmed an area of shadowing at the 12:00 radiant 1 cm from the nipple and in addition 3 adjacent hypoechoic masses in the upper inner quadrant of the left breast, the largest measuring 1.1 cm and altogether measuring 2.5 cm. Ultrasound of the left axilla was benign  On 09/30/2015 the patient underwent cyst aspiration and also ultrasound-guided biopsy of 2 areas in the left breast, described as 11:00 and 12:00. The 12:00 area showed invasive ductal carcinoma, grade 1. The 11:00 biopsy showed ductal carcinoma in situ. The invasive tumor was estrogen  receptor positive at greater than 90%, progesterone receptor positive at greater than 90%, both with strong staining intensity. HER-2 was equivocal by immunohistochemistry but negative by FISH with a signals ratio of 1.23 and the number per cell 2.0.  On 10/12/2015 the patient had a breast MRI showing no findings on the right, but on the left at the 12:00 region there was a spiculated enhancing mass measuring 2.2 cm. In the upper inner quadrant of the left breast there was an area of enhancement measuring 1.1 cm. In the lateral aspect of the breast was a benign-appearing intramammary lymph node and there were no abnormal appearing lymph nodes in either axilla.  With this information the patient proceeded to left seed bracketed lumpectomy and sentinel lymph node sampling 11/02/2015. The final pathology ([id number]) confirmed invasive ductal carcinoma, grade 1, measuring 2.2 cm, with all 3 sentinel lymph nodes clear. Margins were clear. The repeat prognostic panel again showed estrogen receptor 100% positivity, progesterone receptor 100% positivity, both with strong staining intensity, and no HER-2 amplification, the signals ratio being 1.06 and the number per cell 1.90.  Oncotype DX score of 18 predicted a 10 year risk of outside the breast recurrence of 12% if the patient's only systemic treatment was tamoxifen for 5 years. It also predicted no significant benefit from chemotherapy The patient then proceeded to adjuvant radiation completed 02/11/2016. She was started on letrozole 03/03/2016 by our survivorship nurse practitioner  Her subsequent history is as detailed below  INTERVAL HISTORY: Lindsay Pacheco reports today with her daughter [name], for a follow up. Lindsay Pacheco was started on letrozole 03/03/2016. Pt is tolerating the treatment well. She does endorses hot flashes and states that her head is always sweating.   REVIEW OF SYSTEMS:  She has not been doing a very good job taking care of herself because of  stress related to her family. The pt has not been managing her diabetes well and reports that her blood pressure has been high. Pt is on disability and is always worrying about money. Her mother has been living with her and notes they do not get along well. Pt also notes issues with her estranged sister. She has not been exercising and reports that she had her right knee replaced in the past and that it as been irritating her. She also needs her left knee replaced in the future. She has been referred by Dr. Wilburn Cornelia for a sleep study for her severe sleep apnea. She denies unusual headaches, visual changes, nausea, vomiting, or dizziness. There has been no unusual cough, phlegm production, or pleurisy. This been no change in bowel or bladder habits. She denies unexplained fatigue or unexplained weight loss, bleeding, rash, or fever. A detailed review of systems was otherwise entirely negative.    PAST MEDICAL HISTORY: Past Medical History:  Diagnosis Date  . Anxiety   . Arthritis    "knees" (11/02/2015)  . Breast cancer ([place]) 2017   left breast   . Breast cancer, left breast ([place]) 09/30/2015  . Depression   . Diabetes mellitus, type II ([place])   . Diverticulitis 1987  . Hypertension   . Irritable bowel syndrome (IBS)   . Sleep apnea    does not use CPAP but "I'm suppose to" (11/02/2015)    PAST SURGICAL HISTORY: Past Surgical History:  Procedure Laterality Date  . BREAST BIOPSY Left 09/2015  . BREAST LUMPECTOMY Left 11/02/2015  . BREAST LUMPECTOMY WITH AXILLARY LYMPH NODE BIOPSY Left 11/02/2015   BREAST LUMPECTOMY WITH BRACKETED RADIOACTIVE SEED AND LEFT AXILLARY SENTINEL LYMPH NODE BIOPSY   . BREAST LUMPECTOMY WITH RADIOACTIVE SEED AND SENTINEL LYMPH NODE BIOPSY Left 11/02/2015   Procedure: LEFT BREAST LUMPECTOMY WITH BRACKETED RADIOACTIVE SEED AND LEFT AXILLARY SENTINEL LYMPH NODE BIOPSY;  Surgeon: [name], MD;  Location: [facility];  Service: General;   Laterality: Left;  . COLONOSCOPY N/A 12/09/2015   Procedure: COLONOSCOPY;  Surgeon: [name], MD;  [facility];  Service: Endoscopy;  Laterality: N/A;  1:00  . EVACUATION BREAST HEMATOMA Left 11/02/2015  . EVACUATION BREAST HEMATOMA Left 11/02/2015   Procedure: EVACUATION HEMATOMA BREAST;  Surgeon: [name], MD;  Location: [facility];  Service: General;  Laterality: Left;  . JOINT REPLACEMENT    . KNEE ARTHROSCOPY Right 2000s  . LAPAROSCOPIC CHOLECYSTECTOMY  1995  . POLYPECTOMY  12/09/2015   Procedure: POLYPECTOMY;  Surgeon: [name], MD;  [facility];  Service: Endoscopy;;  multiple  . TOTAL KNEE ARTHROPLASTY Right 2011    FAMILY HISTORY Family History  Problem Relation Age of Onset  . Anxiety disorder Father   . Depression Father   . Other Father        Abestosis  . Depression Sister   . Diabetes Mother   The patient's parents are still living, in their mid to late 11s. The patient has one brother and one sister. On the paternal side one uncle had spinal cord cancer and both paternal grandparents had lung cancer in the setting of tobacco abuse. The patient has one paternal aunt diagnosed with breast cancer in her 19s  GYNECOLOGIC HISTORY:  No LMP recorded. Patient is postmenopausal. Menarche age 7, first live birth age 53, she is GX P1. She  went through menopause in her early 29s. She never took hormone replacement.  SOCIAL HISTORY:  Kelcie used to work in [employer], applications but she is disabled secondary to knee problems and psychiatry's. She is divorced. At home her mother has recently moved in with her. Daughter Benjie Karvonen half graduated from [employer] and is working as Architectural technologist for [employer] but lives locally. The patient is a Tourist information centre manager.    ADVANCED DIRECTIVES: Patient has the paperwork but has not yet completed it   HEALTH MAINTENANCE: Social History  Substance Use Topics  . Smoking  status: Never Smoker  . Smokeless tobacco: Never Used  . Alcohol use Yes     Comment: 11/02/2015 "maybe 4 drinks/year"     Colonoscopy: 12/09/2015/[facility]  PAP:  Bone density: DEC 2017/ [facility]   Allergies  Allergen Reactions  . Adhesive [Tape] Rash  . Sulfa Antibiotics Nausea And Vomiting    Current Outpatient Prescriptions  Medication Sig Dispense Refill  . acetaminophen (TYLENOL) 500 MG tablet Take 1,000 mg by mouth every 6 (six) hours as needed for moderate pain or headache.    Marland Kitchen atorvastatin (LIPITOR) 10 MG tablet Take 10 mg by mouth daily.    Marland Kitchen docusate sodium (COLACE) 100 MG capsule Take 1 capsule (100 mg total) by mouth 2 (two) times daily. 10 capsule 0  . DULoxetine (CYMBALTA) 30 MG capsule 90 mg (60 mg + 30 mg) 30 capsule 2  . DULoxetine (CYMBALTA) 60 MG capsule 90 mg (60 mg + 30 mg) 30 capsule 2  . letrozole (FEMARA) 2.5 MG tablet Take 1 tablet (2.5 mg total) by mouth daily. 90 tablet 3  . lisinopril-hydrochlorothiazide (PRINZIDE,ZESTORETIC) 20-12.5 MG tablet Take 1 tablet by mouth daily.  6  . metFORMIN (GLUCOPHAGE) 500 MG tablet Take 500 mg by mouth 2 (two) times daily with a meal.     . psyllium (METAMUCIL SMOOTH TEXTURE) 28 % packet Take 1 packet by mouth at bedtime.     No current facility-administered medications for this visit.     OBJECTIVE: Morbidly obese white woman Who appears stated age  56:   10/13/16 1241  BP: 122/80  Pulse: (!) 115  Resp: 18  Temp: 97.7 F (36.5 C)  SpO2: 97%     Body mass index is 43.59 kg/m.    ECOG FS:1 - Symptomatic but completely ambulatory  Sclerae unicteric, EOMs intact Oropharynx clear and moist No cervical or supraclavicular adenopathy Lungs no rales or rhonchi Heart regular rate and rhythm Abd soft, nontender, positive bowel sounds MSK no focal spinal tenderness, no upper extremity lymphedema Neuro: nonfocal, well oriented, appropriate affect Breasts: The right breast is benign. The left breast is status post  lumpectomy and radiation. The cosmetic result is excellent. Both axillae are benign.  LAB RESULTS:  CMP     Component Value Date/Time   NA 140 10/13/2016 1222   K 4.0 10/13/2016 1222   CL 99 07/15/2016 1335   CO2 27 10/13/2016 1222   GLUCOSE 234 (H) 10/13/2016 1222   BUN 12.5 10/13/2016 1222   CREATININE 0.9 10/13/2016 1222   CALCIUM 10.0 10/13/2016 1222   PROT 7.5 10/13/2016 1222   ALBUMIN 3.7 10/13/2016 1222   AST 17 10/13/2016 1222   ALT 15 10/13/2016 1222   ALKPHOS 139 10/13/2016 1222   BILITOT 0.67 10/13/2016 1222   GFRNONAA >60 10/28/2015 1311   GFRAA >60 10/28/2015 1311    No results found for: TOTALPROTELP, ALBUMINELP, A1GS, A2GS, BETS, BETA2SER, GAMS, MSPIKE, SPEI  No results found for: Nils Pyle, [facility]  Lab Results  Component Value Date   WBC 6.2 10/13/2016   NEUTROABS 3.6 10/13/2016   HGB 13.3 10/13/2016   HCT 40.4 10/13/2016   MCV 83.6 10/13/2016   PLT 288 10/13/2016      Chemistry      Component Value Date/Time   NA 140 10/13/2016 1222   K 4.0 10/13/2016 1222   CL 99 07/15/2016 1335   CO2 27 10/13/2016 1222   BUN 12.5 10/13/2016 1222   CREATININE 0.9 10/13/2016 1222      Component Value Date/Time   CALCIUM 10.0 10/13/2016 1222   ALKPHOS 139 10/13/2016 1222   AST 17 10/13/2016 1222   ALT 15 10/13/2016 1222   BILITOT 0.67 10/13/2016 1222       No results found for: LABCA2  No components found for: [id number]  No results for input(s): INR in the last 168 hours.  Urinalysis    Component Value Date/Time   COLORURINE YELLOW 01/20/2010 1045   APPEARANCEUR CLEAR 01/20/2010 1045   LABSPEC 1.016 01/20/2010 1045   PHURINE 6.0 01/20/2010 1045   GLUCOSEU NEGATIVE 10/06/2009 1040   HGBUR NEGATIVE 01/20/2010 [place] 01/20/2010 [place] 01/20/2010 1045   PROTEINUR NEGATIVE 01/20/2010 1045   UROBILINOGEN 0.2 01/20/2010 1045   NITRITE NEGATIVE 01/20/2010 1045   LEUKOCYTESUR SMALL (A)  01/20/2010 1045     STUDIES: Diagnostic Bilateral Breast Mammogram with TOMO on 10/12/2016, breast density category C, shows no evidence of malignancy.  ELIGIBLE FOR AVAILABLE RESEARCH PROTOCOL: no   ASSESSMENT: 56 y.o. [place], [place] woman status post left breast biopsy 2 (overlapping sites) 09/30/2015 for invasive ductal carcinoma, grade 1, estrogen receptor and progesterone receptor strongly positive, HER-2 nonamplified.  (1) status post left lumpectomy 11/02/2015 for a pT2 pN0, stage IIA (2017 staging) invasive ductal carcinoma, again estrogen and progesterone receptor positive, HER-2 not amplified, with negative margins  (a) restaged as IB in the 2018 prognostic classification     (2) Oncotype score of 18 predicts a 10 year risk of recurrence outside the breast of 12% if the patient's only systemic therapy is tamoxifen for 5 years. It also predicts no significant benefit from chemotherapy  (3) Adjuvant radiation 12/22/15 - 02/11/16 Site/dose:    1) Left breast: 50.4 Gy in 28 fractions 2) Left breast boost: 10 Gy in 5 fractions  (4) letrozole started 03/03/2016  (a) [facility] 12/07/2015 [facility] showed a T score of -0.8  PLAN: Cara is now a year out from definitive surgery for her breast cancer with no evidence of disease recurrence. This is very favorable.  The plan is to continue letrozole for a total of 5 years.  In general she is tolerating letrozole well. Hot flashes of the main issue. I think she may benefit from gabapentin at bedtime. We discussed the possible toxicities, side effects and complete locations of this agent which we generally prefer not to use during the day because of somnolence issues. Hopefully it will allow her to sleep a little bit better and little bit longer  Otherwise she is going to return to see me in one year. She knows to call for any problems that may develop before that visit.   Magrinat, Virgie Dad, MD  10/13/16 1:06 PM Medical Oncology and  Hematology [facility] [address] [place], [place] [address] Tel. [phone]    Fax. [phone]  This document serves as a record of services  personally performed by Chauncey Cruel, MD. It was created on her behalf by Margit Banda, a trained medical scribe. The creation of this record is based on the scribe's personal observations and the provider's statements to them. This document has been checked and approved by the attending provider.

## 2016-10-13 ENCOUNTER — Other Ambulatory Visit (HOSPITAL_BASED_OUTPATIENT_CLINIC_OR_DEPARTMENT_OTHER): Payer: Medicare Other

## 2016-10-13 ENCOUNTER — Ambulatory Visit (HOSPITAL_BASED_OUTPATIENT_CLINIC_OR_DEPARTMENT_OTHER): Payer: Medicare Other | Admitting: Oncology

## 2016-10-13 VITALS — BP 122/80 | HR 115 | Temp 97.7°F | Resp 18 | Ht 67.0 in | Wt 278.3 lb

## 2016-10-13 DIAGNOSIS — C50812 Malignant neoplasm of overlapping sites of left female breast: Secondary | ICD-10-CM

## 2016-10-13 DIAGNOSIS — Z79811 Long term (current) use of aromatase inhibitors: Secondary | ICD-10-CM

## 2016-10-13 DIAGNOSIS — C50212 Malignant neoplasm of upper-inner quadrant of left female breast: Secondary | ICD-10-CM

## 2016-10-13 DIAGNOSIS — Z170 Estrogen receptor positive status [ER+]: Secondary | ICD-10-CM | POA: Diagnosis not present

## 2016-10-13 DIAGNOSIS — N951 Menopausal and female climacteric states: Secondary | ICD-10-CM

## 2016-10-13 LAB — COMPREHENSIVE METABOLIC PANEL
ALBUMIN: 3.7 g/dL (ref 3.5–5.0)
ALK PHOS: 139 U/L (ref 40–150)
ALT: 15 U/L (ref 0–55)
AST: 17 U/L (ref 5–34)
Anion Gap: 11 mEq/L (ref 3–11)
BUN: 12.5 mg/dL (ref 7.0–26.0)
CO2: 27 meq/L (ref 22–29)
Calcium: 10 mg/dL (ref 8.4–10.4)
Chloride: 101 mEq/L (ref 98–109)
Creatinine: 0.9 mg/dL (ref 0.6–1.1)
EGFR: >60 mL/min/{1.73_m2} (ref >60)
GLUCOSE: 234 mg/dL — AB (ref 70–140)
POTASSIUM: 4 meq/L (ref 3.5–5.1)
Sodium: 140 mEq/L (ref 136–145)
Total Bilirubin: 0.67 mg/dL (ref 0.20–1.20)
Total Protein: 7.5 g/dL (ref 6.4–8.3)

## 2016-10-13 LAB — CBC WITH DIFFERENTIAL/PLATELET
BASO%: 3.4 % — AB (ref 0.0–2.0)
BASOS ABS: 0.2 10*3/uL — AB (ref 0.0–0.1)
EOS ABS: 0.4 10*3/uL (ref 0.0–0.5)
EOS%: 5.7 % (ref 0.0–7.0)
HEMATOCRIT: 40.4 % (ref 34.8–46.6)
HGB: 13.3 g/dL (ref 11.6–15.9)
LYMPH%: 26.4 % (ref 14.0–49.7)
MCH: 27.4 pg (ref 25.1–34.0)
MCHC: 32.8 g/dL (ref 31.5–36.0)
MCV: 83.6 fL (ref 79.5–101.0)
MONO#: 0.4 10*3/uL (ref 0.1–0.9)
MONO%: 6.6 % (ref 0.0–14.0)
NEUT#: 3.6 10*3/uL (ref 1.5–6.5)
NEUT%: 57.9 % (ref 38.4–76.8)
PLATELETS: 288 10*3/uL (ref 145–400)
RBC: 4.84 10*6/uL (ref 3.70–5.45)
RDW: 13.8 % (ref 11.2–14.5)
WBC: 6.2 10*3/uL (ref 3.9–10.3)
lymph#: 1.6 10*3/uL (ref 0.9–3.3)

## 2016-10-27 ENCOUNTER — Other Ambulatory Visit: Payer: Self-pay | Admitting: *Deleted

## 2016-10-27 MED ORDER — GABAPENTIN 300 MG PO CAPS: 300.0000 mg | ORAL_CAPSULE | Freq: Every day | ORAL | 3 refills | Status: DC

## 2016-12-06 ENCOUNTER — Encounter (INDEPENDENT_AMBULATORY_CARE_PROVIDER_SITE_OTHER): Payer: Self-pay | Admitting: Internal Medicine

## 2016-12-06 ENCOUNTER — Ambulatory Visit (INDEPENDENT_AMBULATORY_CARE_PROVIDER_SITE_OTHER): Payer: Medicare Other | Admitting: Internal Medicine

## 2016-12-06 ENCOUNTER — Encounter (INDEPENDENT_AMBULATORY_CARE_PROVIDER_SITE_OTHER): Payer: Self-pay

## 2016-12-06 VITALS — BP 128/80 | HR 64 | Temp 98.0°F | Ht 68.0 in | Wt 277.3 lb

## 2016-12-06 DIAGNOSIS — R1032 Left lower quadrant pain: Secondary | ICD-10-CM

## 2016-12-06 MED ORDER — CIPROFLOXACIN HCL 500 MG PO TABS: 500.0000 mg | ORAL_TABLET | Freq: Two times a day (BID) | ORAL | 0 refills | Status: DC

## 2016-12-06 MED ORDER — METRONIDAZOLE 500 MG PO TABS: 500.0000 mg | ORAL_TABLET | Freq: Two times a day (BID) | ORAL | 0 refills | Status: DC

## 2016-12-06 NOTE — Patient Instructions (Signed)
OV 1 year. 

## 2016-12-06 NOTE — Progress Notes (Signed)
Subjective:    Patient ID: Lindsay Pacheco, female    DOB: 1960/08/29, 56 y.o.   MRN: [id number]  HPI Presents today with c/o abdominal pain. Last seen in September of 2017 as a new consult.  Underwent a CT  In September of 2017. Impression: Mild sigmoid diverticulitis.  No drainable fluid collection/abscess. About a month ago, she was having LLQ pain. Seen at [facility]. Given Rx for Cipro and Flagyl. She for the most part felt better. Last Friday, she started to have RLQ pain radiating into her back. No pain today.  She says everytime she eats, she has a BM. No melena or BRRB.  She feels fine today.  Her appetite is good. No weight loss.  Underwent a colonoscopy in December of 2017 for f/u of diverticulitis.  Impression:               - One small polyp in the cecum. Biopsied.                           - Five 5 mm polyps in the ascending colon, removed                            with a cold snare. Resected and retrieved.                           - Diverticulosis in the sigmoid colon.                           - One 30 mm polyp in the rectum, removed with a hot                            snare. Resected and retrieved.                           - External hemorrhoids. Biopsy:  Rectal polyp is sessile serrated adenoma without dysplasia. 2 other small polyps are tubular adenomas.      Review of Systems Past Medical History:  Diagnosis Date  . Anxiety   . Arthritis    "knees" (11/02/2015)  . Breast cancer ([place]) 2017   left breast   . Breast cancer, left breast ([place]) 09/30/2015  . Depression   . Diabetes mellitus, type II ([place])   . Diverticulitis 1987  . Hypertension   . Irritable bowel syndrome (IBS)   . Sleep apnea    does not use CPAP but "I'm suppose to" (11/02/2015)    Past Surgical History:  Procedure Laterality Date  . BREAST BIOPSY Left 09/2015  . BREAST LUMPECTOMY Left 11/02/2015  . BREAST LUMPECTOMY WITH AXILLARY LYMPH NODE BIOPSY Left 11/02/2015   BREAST  LUMPECTOMY WITH BRACKETED RADIOACTIVE SEED AND LEFT AXILLARY SENTINEL LYMPH NODE BIOPSY   . BREAST LUMPECTOMY WITH RADIOACTIVE SEED AND SENTINEL LYMPH NODE BIOPSY Left 11/02/2015   Procedure: LEFT BREAST LUMPECTOMY WITH BRACKETED RADIOACTIVE SEED AND LEFT AXILLARY SENTINEL LYMPH NODE BIOPSY;  Surgeon: [name], MD;  Location: [place];  Service: General;  Laterality: Left;  . COLONOSCOPY N/A 12/09/2015   Procedure: COLONOSCOPY;  Surgeon: [name], MD;  [facility];  Service: Endoscopy;  Laterality: N/A;  1:00  . EVACUATION BREAST HEMATOMA Left  11/02/2015  . EVACUATION BREAST HEMATOMA Left 11/02/2015   Procedure: EVACUATION HEMATOMA BREAST;  Surgeon: [name], MD;  Location: [place];  Service: General;  Laterality: Left;  . JOINT REPLACEMENT    . KNEE ARTHROSCOPY Right 2000s  . LAPAROSCOPIC CHOLECYSTECTOMY  1995  . POLYPECTOMY  12/09/2015   Procedure: POLYPECTOMY;  Surgeon: [name], MD;  [facility];  Service: Endoscopy;;  multiple  . TOTAL KNEE ARTHROPLASTY Right 2011    Allergies  Allergen Reactions  . Adhesive [Tape] Rash  . Sulfa Antibiotics Nausea And Vomiting    Current Outpatient Medications on File Prior to Visit  Medication Sig Dispense Refill  . acetaminophen (TYLENOL) 500 MG tablet Take 1,000 mg by mouth every 6 (six) hours as needed for moderate pain or headache.    Marland Kitchen atorvastatin (LIPITOR) 10 MG tablet Take 10 mg by mouth daily.    . DULoxetine (CYMBALTA) 30 MG capsule 90 mg (60 mg + 30 mg) 30 capsule 2  . DULoxetine (CYMBALTA) 60 MG capsule 90 mg (60 mg + 30 mg) 30 capsule 2  . gabapentin (NEURONTIN) 300 MG capsule Take 1 capsule (300 mg total) by mouth at bedtime. 30 capsule 3  . lisinopril-hydrochlorothiazide (PRINZIDE,ZESTORETIC) 20-12.5 MG tablet Take 1 tablet by mouth daily.  6  . metFORMIN (GLUCOPHAGE) 500 MG tablet Take 500 mg by mouth 2 (two) times daily with a meal.     .  letrozole (FEMARA) 2.5 MG tablet Take 1 tablet (2.5 mg total) by mouth daily. 90 tablet 3   No current facility-administered medications on file prior to visit.         Objective:   Physical Exam Blood pressure 128/80, pulse 64, temperature 98 F (36.7 C), height 5\' 8"  (1.727 m), weight 277 lb 4.8 oz (125.8 kg). Alert and oriented. Skin warm and dry. Oral mucosa is moist.   . Sclera anicteric, conjunctivae is pink. Thyroid not enlarged. No cervical lymphadenopathy. Lungs clear. Heart regular rate and rhythm.  Abdomen is soft. Bowel sounds are positive. No hepatomegaly. No abdominal masses felt. No tenderness.  No edema to lower extremities.          Assessment & Plan:  LLQ pain resolved. Will send an Rx in for cipro and Flagyl in case she has another flare.  Advised to call if pain returns. Will consider a CT abdomen.

## 2017-01-31 ENCOUNTER — Other Ambulatory Visit (HOSPITAL_COMMUNITY): Payer: Self-pay | Admitting: Adult Health

## 2017-01-31 DIAGNOSIS — C50212 Malignant neoplasm of upper-inner quadrant of left female breast: Secondary | ICD-10-CM

## 2017-01-31 DIAGNOSIS — Z170 Estrogen receptor positive status [ER+]: Principal | ICD-10-CM

## 2017-02-01 ENCOUNTER — Telehealth (HOSPITAL_COMMUNITY): Payer: Self-pay | Admitting: Psychiatry

## 2017-02-01 DIAGNOSIS — F331 Major depressive disorder, recurrent, moderate: Secondary | ICD-10-CM

## 2017-02-01 MED ORDER — DULOXETINE HCL 30 MG PO CPEP: ORAL_CAPSULE | ORAL | 0 refills | Status: DC

## 2017-02-01 MED ORDER — DULOXETINE HCL 60 MG PO CPEP: ORAL_CAPSULE | ORAL | 0 refills | Status: DC

## 2017-02-01 NOTE — Telephone Encounter (Signed)
Received a refill request for duloxetine. Ordered for a month. Please contact the patient to make follow up appointment. No more refill without evaluation.

## 2017-02-01 NOTE — Telephone Encounter (Signed)
Dr. Jana Hakim,   It looks like this patient has transferred her care to you. Will you please refill if clinically appropriate?   Thanks, Elzie Rings

## 2017-02-01 NOTE — Telephone Encounter (Signed)
Spoke with & informed patient per Dr Hisada:Received a refill request for duloxetine. Ordered for a month. Please contact the patient to make follow up appointment. No more refill without evaluation.   [facility] appointment has been scheduled for 02/15/17 @ 9:00am

## 2017-02-14 NOTE — Progress Notes (Signed)
[place] MD/PA/NP OP Progress Note  02/15/2017 9:09 AM Lindsay Pacheco  MRN:  [id number]  Chief Complaint:  Chief Complaint    Depression; Follow-up     HPI:  Patient presents for follow-up appointment for depression. She has not contacted for therapy, stating that she has started to do two part-time jobs.  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  She enjoys those.  She states that although her sister came back last October, her sister "blew up" and left again in December.  She believes she is doing better without her sister, who makes her feel like "pins and needles."  She feels frustrated with her mother at home, who does not support the patient, although her mother appears to think that her mother is nurturing to the patient. She also talks about her daughter; although they disagrees, she states that she can do nothing about it as her daughter is 80 year old. Although she does have "moment" due to these stress, she "try my best." Reflected on how she acts in different way this time in the midst of stress.   She endorses insomnia, which she attributes to sleep apnea.  She feels depressed and fatigued at times.  She has fair concentration.  She denies SI.  She feels less anxious.  She has had a few panic attacks.  She uses marijuana twice a months for anxiety.  She is planning to join [facility] for regular exercise.   Visit Diagnosis:    ICD-10-CM   1. Moderate episode of recurrent major depressive disorder ([place]) F33.1     Past Psychiatric History:  I have reviewed the patient's psychiatry history in detail and updated the patient record. Outpatient: used to be seen at [facility] for counseling Psychiatry admission: [facility] in 2012 for depression, SI of overdosing Ativan, IOP Previous suicide attempt:SI of overdosing Ativan in 2012, no SIB Past trials of medication: Paxil (felt numb), Effexor, Depakote (limited effect), Abilify, Klonopin,      Past Medical History:  Past Medical History:  Diagnosis Date  . Anxiety   . Arthritis    "knees" (11/02/2015)  . Breast cancer ([place]) 2017   left breast   . Breast cancer, left breast ([place]) 09/30/2015  . Depression   . Diabetes mellitus, type II ([place])   . Diverticulitis 1987  . Hypertension   . Irritable bowel syndrome (IBS)   . Sleep apnea    does not use CPAP but "I'm suppose to" (11/02/2015)    Past Surgical History:  Procedure Laterality Date  . BREAST BIOPSY Left 09/2015  . BREAST LUMPECTOMY Left 11/02/2015  . BREAST LUMPECTOMY WITH AXILLARY LYMPH NODE BIOPSY Left 11/02/2015   BREAST LUMPECTOMY WITH BRACKETED RADIOACTIVE SEED AND LEFT AXILLARY SENTINEL LYMPH NODE BIOPSY   . BREAST LUMPECTOMY WITH RADIOACTIVE SEED AND SENTINEL LYMPH NODE BIOPSY Left 11/02/2015   Procedure: LEFT BREAST LUMPECTOMY WITH BRACKETED RADIOACTIVE SEED AND LEFT AXILLARY SENTINEL LYMPH NODE BIOPSY;  Surgeon: [name], MD;  Location: [place];  Service: General;  Laterality: Left;  . COLONOSCOPY N/A 12/09/2015   Procedure: COLONOSCOPY;  Surgeon: [name], MD;  [facility];  Service: Endoscopy;  Laterality: N/A;  1:00  . EVACUATION BREAST HEMATOMA Left 11/02/2015  . EVACUATION BREAST HEMATOMA Left 11/02/2015   Procedure: EVACUATION HEMATOMA BREAST;  Surgeon: [name], MD;  Location: [place];  Service: General;  Laterality: Left;  . JOINT REPLACEMENT    .  KNEE ARTHROSCOPY Right 2000s  . LAPAROSCOPIC CHOLECYSTECTOMY  1995  . POLYPECTOMY  12/09/2015   Procedure: POLYPECTOMY;  Surgeon: [name], MD;  [facility];  Service: Endoscopy;;  multiple  . TOTAL KNEE ARTHROPLASTY Right 2011    Family Psychiatric History:  I have reviewed the patient's family history in detail and updated the patient record. On disability for chronic pain secondary to arthritis, depression, anxiety She used to work at [employer]  until 2011 when she was admitted Education: graduated from [employer], college, no degrees,  Divorced in 1997, has one daughter She lives with her mother   Family History:  Family History  Problem Relation Age of Onset  . Anxiety disorder Father   . Depression Father   . Other Father        Abestosis  . Depression Sister   . Diabetes Mother     Social History:  Social History   Socioeconomic History  . Marital status: Divorced    Spouse name: None  . Number of children: None  . Years of education: None  . Highest education level: None  Social Needs  . Financial resource strain: None  . Food insecurity - worry: None  . Food insecurity - inability: None  . Transportation needs - medical: None  . Transportation needs - non-medical: None  Occupational History  . None  Tobacco Use  . Smoking status: Never Smoker  . Smokeless tobacco: Never Used  Substance and Sexual Activity  . Alcohol use: Yes    Comment: 11/02/2015 "maybe 4 drinks/year"  . Drug use: Yes    Types: Marijuana    Comment: 11/02/2015 "drug use in my past; now maybe 2 puffs 3-4 times/year; for social anxiety"  . Sexual activity: Not Currently  Other Topics Concern  . None  Social History Narrative  . None    Allergies:  Allergies  Allergen Reactions  . Adhesive [Tape] Rash  . Sulfa Antibiotics Nausea And Vomiting    Metabolic Disorder Labs: No results found for: HGBA1C, MPG No results found for: PROLACTIN No results found for: CHOL, TRIG, HDL, CHOLHDL, VLDL, LDLCALC No results found for: TSH  Therapeutic Level Labs: No results found for: LITHIUM Lab Results  Component Value Date   VALPROATE 48.0 (L) 07/15/2016   VALPROATE 50.7 04/18/2016   No components found for:  CBMZ  Current Medications: Current Outpatient Medications  Medication Sig Dispense Refill  . acetaminophen (TYLENOL) 500 MG tablet Take 1,000 mg by mouth every 6 (six) hours as needed for moderate pain or headache.    Marland Kitchen  atorvastatin (LIPITOR) 10 MG tablet Take 10 mg by mouth daily.    . ciprofloxacin (CIPRO) 500 MG tablet Take 1 tablet (500 mg total) by mouth 2 (two) times daily. 20 tablet 0  . DULoxetine (CYMBALTA) 30 MG capsule 90 mg (60 mg + 30 mg) 30 capsule 0  . DULoxetine (CYMBALTA) 60 MG capsule 90 mg (60 mg + 30 mg) 30 capsule 0  . gabapentin (NEURONTIN) 300 MG capsule Take 1 capsule (300 mg total) by mouth at bedtime. 30 capsule 3  . letrozole (FEMARA) 2.5 MG tablet Take 1 tablet (2.5 mg total) by mouth daily. 90 tablet 3  . lisinopril-hydrochlorothiazide (PRINZIDE,ZESTORETIC) 20-12.5 MG tablet Take 1 tablet by mouth daily.  6  . metFORMIN (GLUCOPHAGE) 500 MG tablet Take 500 mg by mouth 2 (two) times daily with a meal.     . metroNIDAZOLE (FLAGYL) 500 MG tablet Take 1 tablet (  500 mg total) by mouth 2 (two) times daily. 20 tablet 0   No current facility-administered medications for this visit.      Musculoskeletal: Strength & Muscle Tone: within normal limits Gait & Station: normal Patient leans: N/A  Psychiatric Specialty Exam: Review of Systems  Psychiatric/Behavioral: Positive for depression. Negative for hallucinations, memory loss, substance abuse and suicidal ideas. The patient is nervous/anxious and has insomnia.   All other systems reviewed and are negative.   Blood pressure 114/68, pulse 98, height 5\' 8"  (1.727 m), weight 276 lb (125.2 kg), SpO2 97 %.Body mass index is 41.97 kg/m.  General Appearance: Fairly Groomed  Eye Contact:  Good  Speech:  Clear and Coherent  Volume:  Normal  Mood:  "fine"  Affect:  Appropriate, Congruent and less labile  Thought Process:  Coherent and Goal Directed  Orientation:  Full (Time, Place, and Person)  Thought Content: Logical   Suicidal Thoughts:  No  Homicidal Thoughts:  No  Memory:  Immediate;   Good Recent;   Good Remote;   Good  Judgement:  Good  Insight:  Fair  Psychomotor Activity:  Normal  Concentration:  Concentration: Good and  Attention Span: Good  Recall:  Good  Fund of Knowledge: Good  Language: Good  Akathisia:  No  Handed:  Right  AIMS (if indicated): not done  Assets:  Communication Skills Desire for Improvement  ADL's:  Intact  Cognition: WNL  Sleep:  Poor   Screenings: PHQ2-9     Follow Up Appointment 15 from 03/31/2016 in [place] from 10/21/2015 in [facility]-  PHQ-2 Total Score  0  2  (Pended)   PHQ-9 Total Score  No data  11  (Pended)        Assessment and Plan:  MAKYNLIE ROSSINI is a 57 y.o. year old female with a history of depression, diabetes, hypertension, left breast cancer s/p bracketed lumpectomy, radiotherapy on letrozole since 03/03/2016 and irritable bower syndrome , who presents for follow up appointment for Moderate episode of recurrent major depressive disorder ([place])  # MDD, moderate, recurrent without psychotic features # r/o borderline personality disorder Exam is notable for less labile affect and less rumination on discordance with her family, which coincided with up titration of duloxetine and starting part-time jobs.  Will continue duloxetine to target neurovegetative symptoms and irritability.  Discussed cognitive diffusion. Discussed self compassion.  Discussed behavioral activation. Noted that she does have cluster B traits, which significantly affects her mood symptoms. She is advised again to contact for group therapy.   Plan I have reviewed and updated plans as below 1. Continue duloxetine 90 mg (60 mg + 30 mg) daily 2. Return to clinic in three months for 30 mins [address] [place] in [address], [place], [place] [address]  The patient demonstrates the following risk factors for suicide: Chronic risk factors for suicide include: psychiatric disorder of depression, substance use disorder, previous suicide attempts overdosing  ativanand chronic pain. Acute risk factorsfor suicide include: family or marital conflict, unemployment, social withdrawal/isolation and loss (financial, interpersonal, professional). Protective factorsfor this patient include: coping skills and hope for the future. Considering these factors, the overall suicide risk at this point appears to be low. Patient isappropriate for outpatient follow up.  The duration of this appointment visit was 30 minutes of face-to-face time with the patient.  Greater than 50% of this time was spent in counseling,  explanation of  diagnosis, planning of further management, and coordination of care.  Norman Clay, MD 02/15/2017, 9:09 AM

## 2017-02-15 ENCOUNTER — Encounter (HOSPITAL_COMMUNITY): Payer: Self-pay | Admitting: Psychiatry

## 2017-02-15 ENCOUNTER — Ambulatory Visit (HOSPITAL_COMMUNITY): Payer: Medicare Other | Admitting: Psychiatry

## 2017-02-15 VITALS — BP 114/68 | HR 98 | Ht 68.0 in | Wt 276.0 lb

## 2017-02-15 DIAGNOSIS — G4700 Insomnia, unspecified: Secondary | ICD-10-CM

## 2017-02-15 DIAGNOSIS — F1290 Cannabis use, unspecified, uncomplicated: Secondary | ICD-10-CM

## 2017-02-15 DIAGNOSIS — Z818 Family history of other mental and behavioral disorders: Secondary | ICD-10-CM | POA: Diagnosis not present

## 2017-02-15 DIAGNOSIS — F331 Major depressive disorder, recurrent, moderate: Secondary | ICD-10-CM | POA: Diagnosis not present

## 2017-02-15 DIAGNOSIS — F419 Anxiety disorder, unspecified: Secondary | ICD-10-CM

## 2017-02-15 DIAGNOSIS — R450 Nervousness: Secondary | ICD-10-CM

## 2017-02-15 MED ORDER — DULOXETINE HCL 30 MG PO CPEP: ORAL_CAPSULE | ORAL | 0 refills | Status: DC

## 2017-02-15 MED ORDER — DULOXETINE HCL 60 MG PO CPEP: ORAL_CAPSULE | ORAL | 0 refills | Status: DC

## 2017-02-15 NOTE — Patient Instructions (Signed)
1. Continue duloxetine 90 mg (60 mg + 30 mg) daily 2. Return to clinic in three months for 30 mins 3 [place] in [place] [address] [place], [place], [place] [address]

## 2017-03-13 ENCOUNTER — Other Ambulatory Visit: Payer: Self-pay | Admitting: *Deleted

## 2017-03-13 MED ORDER — GABAPENTIN 300 MG PO CAPS: 300.0000 mg | ORAL_CAPSULE | Freq: Every day | ORAL | 1 refills | Status: DC

## 2017-05-15 ENCOUNTER — Ambulatory Visit (HOSPITAL_COMMUNITY): Payer: Self-pay | Admitting: Psychiatry

## 2017-05-16 NOTE — Progress Notes (Deleted)
BH MD/PA/NP OP Progress Note  05/16/2017 12:41 PM Lindsay Pacheco  MRN:  [id number]  Chief Complaint:  HPI: *** Visit Diagnosis: No diagnosis found.  Past Psychiatric History:  I have reviewed the patient's psychiatry history in detail and updated the patient record. Outpatient: used to be seen at [facility] for counseling Psychiatry admission: [facility] in 2012 for depression, SI of overdosing Ativan, IOP Previous suicide attempt:SI of overdosing Ativan in 2012, no SIB Past trials of medication: Paxil (felt numb), Effexor,Depakote(limited effect),Abilify, Klonopin,    Past Medical History:  Past Medical History:  Diagnosis Date  . Anxiety   . Arthritis    "knees" (11/02/2015)  . Breast cancer ([place]) 2017   left breast   . Breast cancer, left breast ([place]) 09/30/2015  . Depression   . Diabetes mellitus, type II ([place])   . Diverticulitis 1987  . Hypertension   . Irritable bowel syndrome (IBS)   . Sleep apnea    does not use CPAP but "I'm suppose to" (11/02/2015)    Past Surgical History:  Procedure Laterality Date  . BREAST BIOPSY Left 09/2015  . BREAST LUMPECTOMY Left 11/02/2015  . BREAST LUMPECTOMY WITH AXILLARY LYMPH NODE BIOPSY Left 11/02/2015   BREAST LUMPECTOMY WITH BRACKETED RADIOACTIVE SEED AND LEFT AXILLARY SENTINEL LYMPH NODE BIOPSY   . BREAST LUMPECTOMY WITH RADIOACTIVE SEED AND SENTINEL LYMPH NODE BIOPSY Left 11/02/2015   Procedure: LEFT BREAST LUMPECTOMY WITH BRACKETED RADIOACTIVE SEED AND LEFT AXILLARY SENTINEL LYMPH NODE BIOPSY;  Surgeon: [name], MD;  Location: [place];  Service: General;  Laterality: Left;  . COLONOSCOPY N/A 12/09/2015   Procedure: COLONOSCOPY;  Surgeon: [name], MD;  [facility];  Service: Endoscopy;  Laterality: N/A;  1:00  . EVACUATION BREAST HEMATOMA Left 11/02/2015  . EVACUATION BREAST HEMATOMA Left 11/02/2015   Procedure: EVACUATION HEMATOMA BREAST;  Surgeon: [name], MD;   Location: [facility];  Service: General;  Laterality: Left;  . JOINT REPLACEMENT    . KNEE ARTHROSCOPY Right 2000s  . LAPAROSCOPIC CHOLECYSTECTOMY  1995  . POLYPECTOMY  12/09/2015   Procedure: POLYPECTOMY;  Surgeon: [name], MD;  [facility];  Service: Endoscopy;;  multiple  . TOTAL KNEE ARTHROPLASTY Right 2011    Family Psychiatric History: I have reviewed the patient's family history in detail and updated the patient record.  Family History:  Family History  Problem Relation Age of Onset  . Anxiety disorder Father   . Depression Father   . Other Father        Abestosis  . Depression Sister   . Diabetes Mother     Social History:  Social History   Socioeconomic History  . Marital status: Divorced    Spouse name: Not on file  . Number of children: Not on file  . Years of education: Not on file  . Highest education level: Not on file  Occupational History  . Not on file  Social Needs  . Financial resource strain: Not on file  . Food insecurity:    Worry: Not on file    Inability: Not on file  . Transportation needs:    Medical: Not on file    Non-medical: Not on file  Tobacco Use  . Smoking status: Never Smoker  . Smokeless tobacco: Never Used  Substance and Sexual Activity  . Alcohol use: Yes    Comment: 11/02/2015 "maybe 4 drinks/year"  . Drug use: Yes    Types:  Marijuana    Comment: 11/02/2015 "drug use in my past; now maybe 2 puffs 3-4 times/year; for social anxiety"  . Sexual activity: Not Currently  Lifestyle  . Physical activity:    Days per week: Not on file    Minutes per session: Not on file  . Stress: Not on file  Relationships  . Social connections:    Talks on phone: Not on file    Gets together: Not on file    Attends religious service: Not on file    Active member of club or organization: Not on file    Attends meetings of clubs or organizations: Not on file    Relationship status: Not on file  Other  Topics Concern  . Not on file  Social History Narrative  . Not on file    Allergies:  Allergies  Allergen Reactions  . Adhesive [Tape] Rash  . Sulfa Antibiotics Nausea And Vomiting    Metabolic Disorder Labs: No results found for: HGBA1C, MPG No results found for: PROLACTIN No results found for: CHOL, TRIG, HDL, CHOLHDL, VLDL, LDLCALC No results found for: TSH  Therapeutic Level Labs: No results found for: LITHIUM Lab Results  Component Value Date   VALPROATE 48.0 (L) 07/15/2016   VALPROATE 50.7 04/18/2016   No components found for:  CBMZ  Current Medications: Current Outpatient Medications  Medication Sig Dispense Refill  . acetaminophen (TYLENOL) 500 MG tablet Take 1,000 mg by mouth every 6 (six) hours as needed for moderate pain or headache.    Marland Kitchen atorvastatin (LIPITOR) 10 MG tablet Take 10 mg by mouth daily.    . ciprofloxacin (CIPRO) 500 MG tablet Take 1 tablet (500 mg total) by mouth 2 (two) times daily. 20 tablet 0  . DULoxetine (CYMBALTA) 30 MG capsule 90 mg (60 mg + 30 mg) daily 90 capsule 0  . DULoxetine (CYMBALTA) 60 MG capsule 90 mg (60 mg + 30 mg) daily 90 capsule 0  . gabapentin (NEURONTIN) 300 MG capsule Take 1 capsule (300 mg total) by mouth at bedtime. 90 capsule 1  . letrozole (FEMARA) 2.5 MG tablet Take 1 tablet (2.5 mg total) by mouth daily. 90 tablet 3  . lisinopril-hydrochlorothiazide (PRINZIDE,ZESTORETIC) 20-12.5 MG tablet Take 1 tablet by mouth daily.  6  . metFORMIN (GLUCOPHAGE) 500 MG tablet Take 500 mg by mouth 2 (two) times daily with a meal.     . metroNIDAZOLE (FLAGYL) 500 MG tablet Take 1 tablet (500 mg total) by mouth 2 (two) times daily. 20 tablet 0   No current facility-administered medications for this visit.      Musculoskeletal: Strength & Muscle Tone: within normal limits Gait & Station: normal Patient leans: N/A  Psychiatric Specialty Exam: ROS  There were no vitals taken for this visit.There is no height or weight on file to  calculate BMI.  General Appearance: Fairly Groomed  Eye Contact:  Good  Speech:  Clear and Coherent  Volume:  Normal  Mood:  {BHH MOOD:22306}  Affect:  {Affect (PAA):22687}  Thought Process:  Coherent  Orientation:  Full (Time, Place, and Person)  Thought Content: Logical   Suicidal Thoughts:  {ST/HT (PAA):22692}  Homicidal Thoughts:  {ST/HT (PAA):22692}  Memory:  Immediate;   Good  Judgement:  {Judgement (PAA):22694}  Insight:  {Insight (PAA):22695}  Psychomotor Activity:  Normal  Concentration:  Concentration: Good and Attention Span: Good  Recall:  Good  Fund of Knowledge: Good  Language: Good  Akathisia:  No  Handed:  Right  AIMS (  if indicated): not done  Assets:  Communication Skills Desire for Improvement  ADL's:  Intact  Cognition: WNL  Sleep:  {BHH GOOD/FAIR/POOR:22877}   Screenings: PHQ2-9     Follow Up Appointment 15 from 03/31/2016 in [facility] from 10/21/2015 in [facility]  PHQ-2 Total Score  0  2  (Pended)   PHQ-9 Total Score  -  11  (Pended)        Assessment and Plan:  Lindsay Pacheco is a 57 y.o. year old female with a history of depression, diabetes, hypertension, left breast cancer s/p bracketed lumpectomy, radiotherapy on letrozole since 03/03/2016 and irritable bower syndrome, who presents for follow up appointment for No diagnosis found.  # MDD, moderate, recurrent without psychotic features # r/o borderline personality disorder   Exam is notable for less labile affect and less rumination on discordance with her family, which coincided with up titration of duloxetine and starting part-time jobs.  Will continue duloxetine to target neurovegetative symptoms and irritability.  Discussed cognitive diffusion. Discussed self compassion.  Discussed behavioral activation. Noted that she does have cluster B traits, which significantly affects her mood symptoms. She is advised  again to contact for group therapy.   Plan I have reviewed and updated plans as below 1. Continue duloxetine 90 mg(60 mg + 30 mg)daily 2. Return to clinic in three months for 30 mins [facility] in [place], [place], [place] [address]  The patient demonstrates the following risk factors for suicide: Chronic risk factors for suicide include: psychiatric disorder of depression, substance use disorder, previous suicide attempts overdosing ativanand chronic pain. Acute risk factorsfor suicide include: family or marital conflict, unemployment, social withdrawal/isolation and loss (financial, interpersonal, professional). Protective factorsfor this patient include: coping skills and hope for the future. Considering these factors, the overall suicide risk at this point appears to be low. Patient isappropriate for outpatient follow up.    Norman Clay, MD 05/16/2017, 12:41 PM

## 2017-05-18 ENCOUNTER — Ambulatory Visit (HOSPITAL_COMMUNITY): Payer: Self-pay | Admitting: Psychiatry

## 2017-05-25 ENCOUNTER — Other Ambulatory Visit: Payer: Self-pay

## 2017-05-25 DIAGNOSIS — C50212 Malignant neoplasm of upper-inner quadrant of left female breast: Secondary | ICD-10-CM

## 2017-05-25 DIAGNOSIS — Z170 Estrogen receptor positive status [ER+]: Principal | ICD-10-CM

## 2017-05-25 MED ORDER — LETROZOLE 2.5 MG PO TABS: 2.5000 mg | ORAL_TABLET | Freq: Every day | ORAL | 3 refills | Status: DC

## 2017-06-05 ENCOUNTER — Emergency Department (HOSPITAL_COMMUNITY)
Admission: EM | Admit: 2017-06-05 | Discharge: 2017-06-05 | Disposition: A | Discharge status: Other Acute Inpatient Hospital | Payer: Medicare Other | Attending: Emergency Medicine | Admitting: Emergency Medicine

## 2017-06-05 ENCOUNTER — Encounter (HOSPITAL_COMMUNITY): Payer: Self-pay | Admitting: *Deleted

## 2017-06-05 ENCOUNTER — Encounter (HOSPITAL_COMMUNITY): Payer: Self-pay | Admitting: Emergency Medicine

## 2017-06-05 ENCOUNTER — Inpatient Hospital Stay (HOSPITAL_COMMUNITY)
Admission: AD | Admit: 2017-06-05 | Discharge: 2017-06-08 | DRG: 885 | Disposition: A | Discharge status: Home / Self Care | Payer: Medicare Other | Source: Intra-hospital | Attending: Psychiatry | Admitting: Psychiatry

## 2017-06-05 ENCOUNTER — Ambulatory Visit (HOSPITAL_COMMUNITY)
Admission: RE | Admit: 2017-06-05 | Discharge: 2017-06-05 | Disposition: A | Discharge status: Home / Self Care | Payer: Medicare Other | Source: Home / Self Care | Attending: Psychiatry | Admitting: Psychiatry

## 2017-06-05 ENCOUNTER — Other Ambulatory Visit: Payer: Self-pay

## 2017-06-05 DIAGNOSIS — Z882 Allergy status to sulfonamides status: Secondary | ICD-10-CM

## 2017-06-05 DIAGNOSIS — F332 Major depressive disorder, recurrent severe without psychotic features: Secondary | ICD-10-CM | POA: Diagnosis present

## 2017-06-05 DIAGNOSIS — Z833 Family history of diabetes mellitus: Secondary | ICD-10-CM

## 2017-06-05 DIAGNOSIS — Z79890 Hormone replacement therapy (postmenopausal): Secondary | ICD-10-CM

## 2017-06-05 DIAGNOSIS — Z915 Personal history of self-harm: Secondary | ICD-10-CM

## 2017-06-05 DIAGNOSIS — I10 Essential (primary) hypertension: Secondary | ICD-10-CM | POA: Diagnosis present

## 2017-06-05 DIAGNOSIS — Z79899 Other long term (current) drug therapy: Secondary | ICD-10-CM

## 2017-06-05 DIAGNOSIS — Z853 Personal history of malignant neoplasm of breast: Secondary | ICD-10-CM | POA: Diagnosis not present

## 2017-06-05 DIAGNOSIS — R45851 Suicidal ideations: Secondary | ICD-10-CM | POA: Insufficient documentation

## 2017-06-05 DIAGNOSIS — F329 Major depressive disorder, single episode, unspecified: Secondary | ICD-10-CM

## 2017-06-05 DIAGNOSIS — G4730 Sleep apnea, unspecified: Secondary | ICD-10-CM | POA: Diagnosis present

## 2017-06-05 DIAGNOSIS — Z818 Family history of other mental and behavioral disorders: Secondary | ICD-10-CM

## 2017-06-05 DIAGNOSIS — T50902A Poisoning by unspecified drugs, medicaments and biological substances, intentional self-harm, initial encounter: Secondary | ICD-10-CM | POA: Diagnosis present

## 2017-06-05 DIAGNOSIS — Z91048 Other nonmedicinal substance allergy status: Secondary | ICD-10-CM | POA: Diagnosis not present

## 2017-06-05 DIAGNOSIS — E119 Type 2 diabetes mellitus without complications: Secondary | ICD-10-CM | POA: Insufficient documentation

## 2017-06-05 DIAGNOSIS — Z96651 Presence of right artificial knee joint: Secondary | ICD-10-CM | POA: Diagnosis present

## 2017-06-05 DIAGNOSIS — Z7984 Long term (current) use of oral hypoglycemic drugs: Secondary | ICD-10-CM

## 2017-06-05 DIAGNOSIS — Z638 Other specified problems related to primary support group: Secondary | ICD-10-CM | POA: Diagnosis not present

## 2017-06-05 DIAGNOSIS — F419 Anxiety disorder, unspecified: Secondary | ICD-10-CM | POA: Diagnosis not present

## 2017-06-05 DIAGNOSIS — T1491XA Suicide attempt, initial encounter: Secondary | ICD-10-CM | POA: Diagnosis not present

## 2017-06-05 DIAGNOSIS — T424X2A Poisoning by benzodiazepines, intentional self-harm, initial encounter: Secondary | ICD-10-CM | POA: Diagnosis not present

## 2017-06-05 LAB — RAPID URINE DRUG SCREEN, HOSP PERFORMED
Amphetamines: NOT DETECTED
BARBITURATES: NOT DETECTED
BENZODIAZEPINES: POSITIVE — AB
Cocaine: NOT DETECTED
Opiates: NOT DETECTED
TETRAHYDROCANNABINOL: NOT DETECTED

## 2017-06-05 LAB — CBC
HEMATOCRIT: 40.2 % (ref 36.0–46.0)
Hemoglobin: 13.2 g/dL (ref 12.0–15.0)
MCH: 28.3 pg (ref 26.0–34.0)
MCHC: 32.8 g/dL (ref 30.0–36.0)
MCV: 86.1 fL (ref 78.0–100.0)
Platelets: 236 10*3/uL (ref 150–400)
RBC: 4.67 MIL/uL (ref 3.87–5.11)
RDW: 14 % (ref 11.5–15.5)
WBC: 9.5 10*3/uL (ref 4.0–10.5)

## 2017-06-05 LAB — COMPREHENSIVE METABOLIC PANEL
ALBUMIN: 4 g/dL (ref 3.5–5.0)
ALT: 18 U/L (ref 14–54)
ANION GAP: 11 (ref 5–15)
AST: 19 U/L (ref 15–41)
Alkaline Phosphatase: 126 U/L (ref 38–126)
BILIRUBIN TOTAL: 0.6 mg/dL (ref 0.3–1.2)
BUN: 12 mg/dL (ref 6–20)
CHLORIDE: 101 mmol/L (ref 101–111)
CO2: 26 mmol/L (ref 22–32)
Calcium: 9.3 mg/dL (ref 8.9–10.3)
Creatinine, Ser: 0.75 mg/dL (ref 0.44–1.00)
GFR calc Af Amer: >60 mL/min (ref >60)
GFR calc non Af Amer: >60 mL/min (ref >60)
GLUCOSE: 147 mg/dL — AB (ref 65–99)
POTASSIUM: 3.9 mmol/L (ref 3.5–5.1)
SODIUM: 138 mmol/L (ref 135–145)
TOTAL PROTEIN: 7.5 g/dL (ref 6.5–8.1)

## 2017-06-05 LAB — ACETAMINOPHEN LEVEL

## 2017-06-05 LAB — SALICYLATE LEVEL: Salicylate Lvl: <7 mg/dL (ref 2.8–30.0)

## 2017-06-05 LAB — ETHANOL: Alcohol, Ethyl (B): <10 mg/dL (ref <10)

## 2017-06-05 MED ORDER — ACETAMINOPHEN 325 MG PO TABS
650.0000 mg | ORAL_TABLET | Freq: Four times a day (QID) | ORAL | Status: DC | PRN
  Administered 2017-06-06 – 2017-06-07 (×2): 650 mg via ORAL
  Filled 2017-06-05 (×2): qty 2

## 2017-06-05 MED ORDER — DULOXETINE HCL 60 MG PO CPEP
90.0000 mg | ORAL_CAPSULE | Freq: Every day | ORAL | Status: DC
  Administered 2017-06-06: 90 mg via ORAL
  Filled 2017-06-05 (×2): qty 1

## 2017-06-05 MED ORDER — LETROZOLE 2.5 MG PO TABS
2.5000 mg | ORAL_TABLET | Freq: Every day | ORAL | Status: DC
  Administered 2017-06-05: 2.5 mg via ORAL
  Filled 2017-06-05: qty 1

## 2017-06-05 MED ORDER — METFORMIN HCL 500 MG PO TABS
500.0000 mg | ORAL_TABLET | Freq: Two times a day (BID) | ORAL | Status: DC
  Administered 2017-06-05: 500 mg via ORAL
  Filled 2017-06-05: qty 1

## 2017-06-05 MED ORDER — TRAZODONE HCL 50 MG PO TABS
50.0000 mg | ORAL_TABLET | Freq: Every evening | ORAL | Status: DC | PRN
  Administered 2017-06-05 – 2017-06-07 (×3): 50 mg via ORAL
  Filled 2017-06-05 (×11): qty 1

## 2017-06-05 MED ORDER — METFORMIN HCL 500 MG PO TABS
500.0000 mg | ORAL_TABLET | Freq: Two times a day (BID) | ORAL | Status: DC
  Administered 2017-06-06: 500 mg via ORAL
  Filled 2017-06-05 (×3): qty 1

## 2017-06-05 MED ORDER — ATORVASTATIN CALCIUM 10 MG PO TABS
10.0000 mg | ORAL_TABLET | Freq: Every day | ORAL | Status: DC
  Administered 2017-06-05: 10 mg via ORAL
  Filled 2017-06-05: qty 1

## 2017-06-05 MED ORDER — LISINOPRIL-HYDROCHLOROTHIAZIDE 20-12.5 MG PO TABS: 1.0000 | ORAL_TABLET | Freq: Every day | ORAL | Status: DC

## 2017-06-05 MED ORDER — HYDROCHLOROTHIAZIDE 12.5 MG PO CAPS
12.5000 mg | ORAL_CAPSULE | Freq: Every day | ORAL | Status: DC
  Administered 2017-06-05: 12.5 mg via ORAL
  Filled 2017-06-05: qty 1

## 2017-06-05 MED ORDER — GABAPENTIN 300 MG PO CAPS
300.0000 mg | ORAL_CAPSULE | Freq: Every day | ORAL | Status: DC
  Administered 2017-06-05 – 2017-06-07 (×3): 300 mg via ORAL
  Filled 2017-06-05 (×6): qty 1

## 2017-06-05 MED ORDER — ALUM & MAG HYDROXIDE-SIMETH 200-200-20 MG/5ML PO SUSP: 30.0000 mL | ORAL | Status: DC | PRN

## 2017-06-05 MED ORDER — MAGNESIUM HYDROXIDE 400 MG/5ML PO SUSP: 30.0000 mL | Freq: Every day | ORAL | Status: DC | PRN

## 2017-06-05 MED ORDER — GABAPENTIN 300 MG PO CAPS: 300.0000 mg | ORAL_CAPSULE | Freq: Every day | ORAL | Status: DC

## 2017-06-05 MED ORDER — ATORVASTATIN CALCIUM 10 MG PO TABS
10.0000 mg | ORAL_TABLET | Freq: Every day | ORAL | Status: DC
  Administered 2017-06-06 – 2017-06-08 (×3): 10 mg via ORAL
  Filled 2017-06-05 (×6): qty 1

## 2017-06-05 MED ORDER — LISINOPRIL 20 MG PO TABS
20.0000 mg | ORAL_TABLET | Freq: Every day | ORAL | Status: DC
  Administered 2017-06-05: 20 mg via ORAL
  Filled 2017-06-05: qty 1

## 2017-06-05 MED ORDER — LISINOPRIL 20 MG PO TABS
20.0000 mg | ORAL_TABLET | Freq: Every day | ORAL | Status: DC
  Administered 2017-06-06 – 2017-06-08 (×3): 20 mg via ORAL
  Filled 2017-06-05 (×6): qty 1

## 2017-06-05 MED ORDER — DULOXETINE HCL 30 MG PO CPEP
90.0000 mg | ORAL_CAPSULE | Freq: Every day | ORAL | Status: DC
  Administered 2017-06-05: 90 mg via ORAL
  Filled 2017-06-05: qty 3

## 2017-06-05 MED ORDER — HYDROCHLOROTHIAZIDE 12.5 MG PO CAPS
12.5000 mg | ORAL_CAPSULE | Freq: Every day | ORAL | Status: DC
  Filled 2017-06-05 (×3): qty 1

## 2017-06-05 MED ORDER — LETROZOLE 2.5 MG PO TABS
2.5000 mg | ORAL_TABLET | Freq: Every day | ORAL | Status: DC
  Administered 2017-06-06 – 2017-06-08 (×3): 2.5 mg via ORAL
  Filled 2017-06-05 (×5): qty 1

## 2017-06-05 NOTE — ED Notes (Signed)
Pt transported to [facility] by [employer] for continuation of specialized care. Pt left in no acute distress. Belongings signed for and given to [facility] officer. Pt left in no acute distress.

## 2017-06-05 NOTE — ED Triage Notes (Signed)
Pt BIB EMS from home s/p ingestion of 11-13 1mg  tablets of Klonopin at around 1900. Patient states she took the klonopin to get away from her mother. Patient states she is groggy, sleepy and embarrassed but has no other complaints.

## 2017-06-05 NOTE — ED Notes (Signed)
Pt ambulatory to restroom

## 2017-06-05 NOTE — Tx Team (Signed)
Initial Treatment Plan 06/05/2017 9:41 PM Lindsay Pacheco VZD:[id number]    PATIENT STRESSORS: Health problems Marital or family conflict   PATIENT STRENGTHS: Ability for insight Average or above average intelligence Capable of independent living General fund of knowledge   PATIENT IDENTIFIED PROBLEMS: Depression Anxiety Suicidal thoughts "Need to get away from my family"                     DISCHARGE CRITERIA:  Ability to meet basic life and health needs Improved stabilization in mood, thinking, and/or behavior Reduction of life-threatening or endangering symptoms to within safe limits Verbal commitment to aftercare and medication compliance  PRELIMINARY DISCHARGE PLAN: Attend aftercare/continuing care group Return to previous living arrangement  PATIENT/FAMILY INVOLVEMENT: This treatment plan has been presented to and reviewed with the patient, Lindsay Pacheco, and/or family member, .  The patient and family have been given the opportunity to ask questions and make suggestions.  Lindsay Pacheco, [place], [place] 06/05/2017, 9:41 PM

## 2017-06-05 NOTE — BH Assessment (Addendum)
Patient meets criteria for inpatient treatment (400 hall) per Dr. Mariea Clonts and Waylan Boga, DNP. Counselor made [facility] aware of bed placement needs.

## 2017-06-05 NOTE — ED Notes (Signed)
Spoke with [employer] and pt is to be sent to [facility] around 7:30 or 8:00 pm

## 2017-06-05 NOTE — ED Notes (Signed)
Bed: [facility] Expected date:  Expected time:  Means of arrival:  Comments: Hold for res A

## 2017-06-05 NOTE — ED Notes (Signed)
Tried to call report; no answer.

## 2017-06-05 NOTE — Progress Notes (Signed)
Lindsay Pacheco is a 57 year old female pt admitted to [facility] after overdose on klonopin. She reports that she was talking to a suicide hotline because she needed someone to talk to and reports that the person on the line called the law and she was then transported to the hospital. She denies any SI on admission and spoke about how she took the klonopin not in an overdose attempt but rather to sleep the night away so she wouldn't have to deal with her family. She spoke about having family issues with her mother as well as her sister. She reports that she takes her medications as prescribed and denies any current substance abuse issues. She reports that the klonopin she took was from an old prescription that she had and reports that she does not have a current klonopin prescription. She reports that she lives with her mother and reports that she will go back to the same living situation upon discharge. She was oriented to the unit and safety maintained.

## 2017-06-05 NOTE — ED Notes (Signed)
Patient currently denies SI/HI/AVH. Plan of care discussed. Encouragement and support provided and safety maintain. Q 15 min safety checks remain in place and video monitoring.

## 2017-06-05 NOTE — ED Notes (Signed)
Bed: RESA Expected date:  Expected time:  Means of arrival:  Comments: 57 yo F/OD
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
## 2017-06-05 NOTE — ED Notes (Signed)
Tried to call report to [facility]; no answer.

## 2017-06-05 NOTE — BH Assessment (Signed)
[place] Assessment Progress Note Per AC, patient has been accepted to [facility] [id number].

## 2017-06-05 NOTE — ED Notes (Signed)
Pt has stayed in bed, calm and cooperative, but anxious. Would like to leave. Minimizes OD and said that she just wanted to sleep until today because her family hurts her by having get together's and leaving her out. She laments that "People who abuse opiates and Ativan ruin it for others who really need it and use it like they should."  Dr. Mariea Clonts reminded pt that she took too many.  Pt said, "Oh."

## 2017-06-05 NOTE — H&P (Signed)
Behavioral Health Medical Screening Exam  Lindsay Pacheco is an 57 y.o. female presents to [facility] for psychiatric evaluation because she is stressed out by her mother. She is feeling depressed and reportedly took # 10, 1mg  of Klonopin several hours ago. She is feeling sleepy, but otherwise denies any problems breathing, altered mentation, CP, Nausea and or abd pains  Total Time spent with patient: 15 minutes  Psychiatric Specialty Exam: Physical Exam  Constitutional: She is oriented to person, place, and time. She appears well-developed and well-nourished. No distress.  HENT:  Head: Normocephalic.  Eyes: Pupils are equal, round, and reactive to light.  Respiratory: Effort normal and breath sounds normal. No respiratory distress. She has no wheezes.  Neurological: She is alert and oriented to person, place, and time. No cranial nerve deficit.  Skin: Skin is warm and dry. She is not diaphoretic.  Psychiatric: Her speech is delayed. She is withdrawn. Cognition and memory are impaired. She expresses impulsivity. She exhibits a depressed mood. She expresses suicidal ideation. She expresses suicidal plans.    Review of Systems  Constitutional: Negative for chills, fever, malaise/fatigue and weight loss.  Respiratory: Negative for cough and shortness of breath.   Cardiovascular: Negative for chest pain and palpitations.  Gastrointestinal: Negative for abdominal pain, heartburn, nausea and vomiting.  Neurological: Negative for dizziness.  Psychiatric/Behavioral: Positive for depression and suicidal ideas.    There were no vitals taken for this visit.There is no height or weight on file to calculate BMI.  General Appearance: Disheveled  Eye Contact:  Fair  Speech:  Garbled  Volume:  Normal  Mood:  Depressed  Affect:  Congruent  Thought Process:  Goal Directed  Orientation:  Full (Time, Place, and Person)  Thought Content:  Logical  Suicidal Thoughts:  Yes.  with intent/plan  Homicidal  Thoughts:  No  Memory:  Immediate;   Fair  Judgement:  Poor  Insight:  Lacking  Psychomotor Activity:  Decreased  Concentration: Concentration: Fair  Recall:  [place]: Fair  Akathisia:  Negative  Handed:  Right  AIMS (if indicated):     Assets:  Desire for Improvement  Sleep:       Musculoskeletal: Strength & Muscle Tone: decreased Gait & Station: unsteady Patient leans: N/A  There were no vitals taken for this visit.  Recommendations:  Based on my evaluation the patient appears to have an emergency medical condition for which I recommend the patient be transferred to the emergency department for further evaluation.  Laverle Hobby, PA-C 06/05/2017, 2:00 AM

## 2017-06-05 NOTE — ED Notes (Signed)
Gina with poison control called and have cleared pt.

## 2017-06-05 NOTE — ED Provider Notes (Addendum)
[place] DEPT Provider Note   CSN: [id number] Arrival date & time: 06/05/17  0215     History   Chief Complaint Chief Complaint  Patient presents with  . Drug Overdose    HPI Lindsay Pacheco is a 57 y.o. female.  She presents to the emergency department for evaluation after overdose.  Patient reportedly took 10-13 1 mg Klonopin tablets around 7 PM tonight.  She reports that this was a suicide attempt.  Patient states that "I hate my life, I hate my family and I wanted to go to sleep and never wake up".     Past Medical History:  Diagnosis Date  . Anxiety   . Arthritis    "knees" (11/02/2015)  . Breast cancer ([place]) 2017   left breast   . Breast cancer, left breast ([place]) 09/30/2015  . Depression   . Diabetes mellitus, type II ([place])   . Diverticulitis 1987  . Hypertension   . Irritable bowel syndrome (IBS)   . Sleep apnea    does not use CPAP but "I'm suppose to" (11/02/2015)    Patient Active Problem List   Diagnosis Date Noted  . Moderate episode of recurrent major depressive disorder ([place]) 10/22/2015  . Malignant neoplasm of upper-inner quadrant of left breast in female, estrogen receptor positive ([place]) 10/14/2015  . Diabetes ([place]) 09/24/2015  . Hypertension     Past Surgical History:  Procedure Laterality Date  . BREAST BIOPSY Left 09/2015  . BREAST LUMPECTOMY Left 11/02/2015  . BREAST LUMPECTOMY WITH AXILLARY LYMPH NODE BIOPSY Left 11/02/2015   BREAST LUMPECTOMY WITH BRACKETED RADIOACTIVE SEED AND LEFT AXILLARY SENTINEL LYMPH NODE BIOPSY   . BREAST LUMPECTOMY WITH RADIOACTIVE SEED AND SENTINEL LYMPH NODE BIOPSY Left 11/02/2015   Procedure: LEFT BREAST LUMPECTOMY WITH BRACKETED RADIOACTIVE SEED AND LEFT AXILLARY SENTINEL LYMPH NODE BIOPSY;  Surgeon: [name], MD;  Location: [place];  Service: General;  Laterality: Left;  . COLONOSCOPY N/A 12/09/2015   Procedure: COLONOSCOPY;  Surgeon: [name],  MD;  [facility];  Service: Endoscopy;  Laterality: N/A;  1:00  . EVACUATION BREAST HEMATOMA Left 11/02/2015  . EVACUATION BREAST HEMATOMA Left 11/02/2015   Procedure: EVACUATION HEMATOMA BREAST;  Surgeon: [name], MD;  Location: [place];  Service: General;  Laterality: Left;  . JOINT REPLACEMENT    . KNEE ARTHROSCOPY Right 2000s  . LAPAROSCOPIC CHOLECYSTECTOMY  1995  . POLYPECTOMY  12/09/2015   Procedure: POLYPECTOMY;  Surgeon: [name], MD;  [facility];  Service: Endoscopy;;  multiple  . TOTAL KNEE ARTHROPLASTY Right 2011     OB History   None      Home Medications    Prior to Admission medications   Medication Sig Start Date End Date Taking? Authorizing Provider  acetaminophen (TYLENOL) 500 MG tablet Take 1,000 mg by mouth every 6 (six) hours as needed for moderate pain or headache.    [provider]  atorvastatin (LIPITOR) 10 MG tablet Take 10 mg by mouth daily.    [provider]  ciprofloxacin (CIPRO) 500 MG tablet Take 1 tablet (500 mg total) by mouth 2 (two) times daily. 12/06/16   Setzer, Rona Ravens, NP  DULoxetine (CYMBALTA) 30 MG capsule 90 mg (60 mg + 30 mg) daily 02/15/17   Norman Clay, MD  DULoxetine (CYMBALTA) 60 MG capsule 90 mg (60 mg + 30 mg) daily 02/15/17   Norman Clay, MD  gabapentin (NEURONTIN) 300  MG capsule Take 1 capsule (300 mg total) by mouth at bedtime. 03/13/17   Magrinat, Virgie Dad, MD  letrozole [facility]) 2.5 MG tablet Take 1 tablet (2.5 mg total) by mouth daily. 05/25/17   Magrinat, Virgie Dad, MD  lisinopril-hydrochlorothiazide (PRINZIDE,ZESTORETIC) 20-12.5 MG tablet Take 1 tablet by mouth daily. 03/10/15   [provider]  metFORMIN (GLUCOPHAGE) 500 MG tablet Take 500 mg by mouth 2 (two) times daily with a meal.     [provider]  metroNIDAZOLE (FLAGYL) 500 MG tablet Take 1 tablet (500 mg total) by mouth 2 (two) times daily. 12/06/16   Setzer, Rona Ravens, NP     Family History Family History  Problem Relation Age of Onset  . Anxiety disorder Father   . Depression Father   . Other Father        Abestosis  . Depression Sister   . Diabetes Mother     Social History Social History   Tobacco Use  . Smoking status: Never Smoker  . Smokeless tobacco: Never Used  Substance Use Topics  . Alcohol use: Yes    Comment: 11/02/2015 "maybe 4 drinks/year"  . Drug use: Yes    Types: Marijuana    Comment: 11/02/2015 "drug use in my past; now maybe 2 puffs 3-4 times/year; for social anxiety"     Allergies   Adhesive [tape] and Sulfa antibiotics   Review of Systems Review of Systems  Psychiatric/Behavioral: Positive for dysphoric mood and suicidal ideas.  All other systems reviewed and are negative.    Physical Exam Updated Vital Signs BP 110/67 (BP Location: Right Arm)   Pulse 98   Temp 97.6 F (36.4 C) (Oral)   Resp (!) 23   SpO2 97%   Physical Exam  Constitutional: She is oriented to person, place, and time. She appears well-developed and well-nourished. No distress.  HENT:  Head: Normocephalic and atraumatic.  Right Ear: Hearing normal.  Left Ear: Hearing normal.  Nose: Nose normal.  Mouth/Throat: Oropharynx is clear and moist and mucous membranes are normal.  Eyes: Pupils are equal, round, and reactive to light. Conjunctivae and EOM are normal.  Neck: Normal range of motion. Neck supple.  Cardiovascular: Regular rhythm, S1 normal and S2 normal. Exam reveals no gallop and no friction rub.  No murmur heard. Pulmonary/Chest: Effort normal and breath sounds normal. No respiratory distress. She exhibits no tenderness.  Abdominal: Soft. Normal appearance and bowel sounds are normal. There is no hepatosplenomegaly. There is no tenderness. There is no rebound, no guarding, no tenderness at McBurney's point and negative Murphy's sign. No hernia.  Musculoskeletal: Normal range of motion.  Neurological: She is alert and oriented to  person, place, and time. She has normal strength. No cranial nerve deficit or sensory deficit. Coordination normal. GCS eye subscore is 4. GCS verbal subscore is 5. GCS motor subscore is 6.  Skin: Skin is warm, dry and intact. No rash noted. No cyanosis.  Psychiatric: Her speech is slurred. She is slowed. She exhibits a depressed mood. She expresses suicidal ideation.  Nursing note and vitals reviewed.    ED Treatments / Results  Labs (all labs ordered are listed, but only abnormal results are displayed) Labs Reviewed - No data to display  EKG EKG Interpretation  Date/Time:  Monday June 05 2017 02:16:09 EDT Ventricular Rate:  98 PR Interval:    QRS Duration: 99 QT Interval:  359 QTC Calculation: 459 R Axis:   6 Text Interpretation:  Sinus rhythm Low voltage, precordial  leads RSR' in V1 or V2, right VCD or RVH Borderline T abnormalities, anterior leads Confirmed by Orpah Greek [phone]) on 06/05/2017 2:24:57 AM   Radiology No results found.  Procedures Procedures (including critical care time)  Medications Ordered in ED Medications - No data to display   Initial Impression / Assessment and Plan / ED Course  I have reviewed the triage vital signs and the nursing notes.  Pertinent labs & imaging results that were available during my care of the patient were reviewed by me and considered in my medical decision making (see chart for details).     Patient presents to the emergency department for evaluation after overdose.  Patient took an intentional overdose as a suicide attempt.  She has history of depression and suicide attempt.  She will require psychiatric evaluation.  Final Clinical Impressions(s) / ED Diagnoses   Final diagnoses:  Intentional drug overdose, initial encounter [facility])    ED Discharge Orders    None       Emani Morad, Gwenyth Allegra, MD 06/05/17 [id number]    Orpah Greek, MD 06/05/17 0225

## 2017-06-06 DIAGNOSIS — T1491XA Suicide attempt, initial encounter: Secondary | ICD-10-CM

## 2017-06-06 DIAGNOSIS — F332 Major depressive disorder, recurrent severe without psychotic features: Principal | ICD-10-CM

## 2017-06-06 DIAGNOSIS — Z638 Other specified problems related to primary support group: Secondary | ICD-10-CM

## 2017-06-06 DIAGNOSIS — F419 Anxiety disorder, unspecified: Secondary | ICD-10-CM

## 2017-06-06 DIAGNOSIS — T424X2A Poisoning by benzodiazepines, intentional self-harm, initial encounter: Secondary | ICD-10-CM

## 2017-06-06 DIAGNOSIS — Z818 Family history of other mental and behavioral disorders: Secondary | ICD-10-CM

## 2017-06-06 LAB — GLUCOSE, CAPILLARY: Glucose-Capillary: 137 mg/dL — ABNORMAL HIGH (ref 65–99)

## 2017-06-06 MED ORDER — DULOXETINE HCL 60 MG PO CPEP
60.0000 mg | ORAL_CAPSULE | Freq: Two times a day (BID) | ORAL | Status: DC
  Administered 2017-06-06 – 2017-06-08 (×4): 60 mg via ORAL
  Filled 2017-06-06 (×9): qty 1

## 2017-06-06 MED ORDER — METFORMIN HCL 500 MG PO TABS
1000.0000 mg | ORAL_TABLET | Freq: Two times a day (BID) | ORAL | Status: DC
  Administered 2017-06-06 – 2017-06-08 (×4): 1000 mg via ORAL
  Filled 2017-06-06 (×9): qty 2

## 2017-06-06 NOTE — Plan of Care (Signed)
  Problem: Coping: Goal: Ability to verbalize frustrations and anger appropriately will improve Outcome: Progressing   Problem: Safety: Goal: Periods of time without injury will increase Outcome: Progressing   

## 2017-06-06 NOTE — Plan of Care (Signed)
  Problem: Activity: Goal: Sleeping patterns will improve Outcome: Progressing Note:  Slept 6.75 hours last night according to flowsheet.    

## 2017-06-06 NOTE — Progress Notes (Signed)
Report received from admitting RN.  Introduced self to pt and spoke with pt 1:1.  Pt was tearful to find out that she is IVC and not voluntary.  Staff was able to verbally de-escalate her.  Reassurance and support provided.  Medication administered per order.  Pt verbally contracts for safety and reports she will inform staff of needs and concerns.  Will continue to monitor and assess.

## 2017-06-06 NOTE — Progress Notes (Signed)
D: Pt was in the dayroom upon initial approach.  She presents with depressed affect and mood.  She smile with interaction.  She reports her day has "been better" and her goal is to "go to group, get snack, shower, and lay down."  Pt reports she had a good visit with her daughter tonight.  She reports having issues with other members of her family and plans to "stay away from toxic people" after discharge.  She denies SI/HI and hallucinations.  She complains of L knee pain of 8/10.  Pt has been visible in milieu interacting with peers and staff appropriately.    A: Met with pt 1:1 and provided support and encouragement.  Medication administered per order.  PRN medication administered for pain.  Cold packs provided for pain.  Q15 minute safety checks maintained.  R: Pt is compliant with medications.  She verbally contracts for safety and reports she will inform staff of needs and concerns.  Will continue to monitor and assess.

## 2017-06-06 NOTE — [facility] Counselor (Signed)
Adult Comprehensive Assessment  Patient ID: Lindsay Pacheco, female   DOB: August 21, 1960, 57 y.o.   MRN: [id number]  Information Source: Information source: Patient  Current Stressors:  Patient states their primary concerns and needs for treatment are:: Pt reports she hopes to discharge soon, believes she overreacted to the stressful situation.  Patient states their goals for this hospitilization and ongoing recovery are:: feel better about myself Family Relationships: Pt reports her family intentionally excludes her from gatherings, difficult for her.   Financial / Lack of resources (include bankruptcy): Pt reports her finances are tight as she is on a fixed income.  Living/Environment/Situation:  Living Arrangements: Parent(mother) Living conditions (as described by patient or guardian): Pt reports her mother "betrayed" her and she is going to move out. Who else lives in the home?: none How long has patient lived in current situation?: 7 years What is atmosphere in current home: Comfortable  Family History:  Marital status: Divorced Divorced, when?: 32 years What types of issues is patient dealing with in the relationship?: none Are you sexually active?: No What is your sexual orientation?: heterosexual Has your sexual activity been affected by drugs, alcohol, medication, or emotional stress?: na Does patient have children?: Yes How many children?: 1 How is patient's relationship with their children?: daughter: pretty good relationship  Childhood History:  By whom was/is the patient raised?: Both parents Additional childhood history information: Patient was born and raised in [place].  OK, childhood.  Parents "did the best they could" Description of patient's relationship with caregiver when they were a child: Patient reports distant relationship with father who was't very nurturing as her put his parents first. She reports her mother did the best she could and made certain she provided for  them. She wasn't very nurturing per patient 't report.  Patient's description of current relationship with people who raised him/her: mom: conflictual relationship, dad: OK relationship How were you disciplined when you got in trouble as a child/adolescent?: spankings, belt once or twice, grounding Does patient have siblings?: Yes Number of Siblings: 2 Description of patient's current relationship with siblings: Patient reports she and sister aren't as close as they used to be and they have a lot of friction in their relationship. She reports good relationship with her brother but he has been in prison since he was 57 years old.  Did patient suffer any verbal/emotional/physical/sexual abuse as a child?: No Did patient suffer from severe childhood neglect?: No Has patient ever been sexually abused/assaulted/raped as an adolescent or adult?: No Was the patient ever a victim of a crime or a disaster?: No Witnessed domestic violence?: No Has patient been effected by domestic violence as an adult?: No  Education:  Highest grade of school patient has completed: HS diploma.  Some college Currently a student?: No Learning disability?: No  Employment/Work Situation:   Employment situation: On disability Why is patient on disability: Arthritis and mental health issues. How long has patient been on disability: 3 years Patient's job has been impacted by current illness: (na) What is the longest time patient has a held a job?: 10 years Where was the patient employed at that time?: [employer] Did You Receive Any Psychiatric Treatment/Services While in the [employer]?: No(No [employer]) Are There Guns or Other Weapons in [place]?: No  Financial Resources:   Financial resources: [employer], Medicare Does patient have a Programmer, applications or guardian?: No  Alcohol/Substance Abuse:   What has been your use of drugs/alcohol within  the last 12 months?: alcohol: pt denies  current alcohol use.  Marijuana: pt deneis current use If attempted suicide, did drugs/alcohol play a role in this?: No Alcohol/Substance Abuse Treatment Hx: Denies past history Has alcohol/substance abuse ever caused legal problems?: No  Social Support System:   Heritage manager System: Poor Describe Community Support System: daughter Type of faith/religion: none How does patient's faith help to cope with current illness?: na  Leisure/Recreation:   Leisure and Hobbies: motorcycle riding, reading, watching movies, tv.  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--good use of time, pt enjoys it Patient states these barriers may affect/interfere with their treatment: none Patient states these barriers may affect their return to the community: financial Other important information patient would like considered in planning for their treatment: none  Discharge Plan:   Currently receiving community mental health services: Yes (From Whom)([facility] [place], Dr Quincy Carnes) Patient states concerns and preferences for aftercare planning are: would like to continue with current provider Patient states they will know when they are safe and ready for discharge when: my thoughts are back together, have a more positive attitude Does patient have access to transportation?: Yes Does patient have financial barriers related to discharge medications?: No Will patient be returning to same living situation after discharge?: Yes  Summary/Recommendations:   Summary and Recommendations (to be completed by the evaluator): Pt is 57 year old female from [place]. [facility]) Pt is diagnosed with major depressive disorder and was admitted due to an intenional overdose and increased  depression.  Pt reports contflict with her family as her primary stressor.  Recommendations for pt include crisis stabilization, therapeutic milieu, attend and participate in groups, medication management, and development of copmrehensive mental wellness plan.  Joanne Chars. 06/06/2017

## 2017-06-06 NOTE — Progress Notes (Signed)
Pt attend wrap up group. 

## 2017-06-06 NOTE — H&P (Signed)
Psychiatric Admission Assessment Adult  Patient Identification: Lindsay Pacheco MRN:  [id number] Date of Evaluation:  06/06/2017 Chief Complaint:  MDD Principal Diagnosis: <principal problem not specified> Diagnosis:   Patient Active Problem List   Diagnosis Date Noted  . Major depressive disorder, recurrent severe without psychotic features ([place]) [F33.2] 06/05/2017  . Malignant neoplasm of upper-inner quadrant of left breast in female, estrogen receptor positive ([place]) [Q68.341, Z17.0] 10/14/2015  . Diabetes ([place]) [E11.9] 09/24/2015  . Hypertension [I10]    History of Present Illness: Patient is seen and examined.  Patient is a 57 year old female with a past psychiatric history significant for major depression who presented to the [facility] emergency department after an intentional overdose of 10-13 1 mg Klonopin tablets.  The patient stated she got into a degree of disagreement with her family members.  She stated she felt as though her family members always look down on her.  She takes care of her mother, and describes her mother's hateful.  She stated she was unable to cope with everything.  She stated she was overwhelmed and took the Klonopin tablets and in an attempt to "go to sleep".  She stated she just wanted to speak to someone and called the suicide hotline.  The police were sent to the home for the safety check.  They took her to the hospital.  She was upset that she was placed under involuntary commitment.  She stated all she wanted to do was to talk with someone.  She stated her last psychiatric hospitalization was over 10 years ago.  She does have an outpatient psychiatrist as well as a therapist.  She denies suicidal ideation on admission and felt bad about her attempt.  She was admitted to the hospital for evaluation and stabilization. Associated Signs/Symptoms: Depression Symptoms:  depressed mood, anhedonia, insomnia, psychomotor agitation, fatigue, feelings of  worthlessness/guilt, difficulty concentrating, hopelessness, suicidal thoughts without plan, anxiety, loss of energy/fatigue, disturbed sleep, (Hypo) Manic Symptoms:  Impulsivity, Anxiety Symptoms:  Excessive Worry, Psychotic Symptoms:  Denied PTSD Symptoms: Negative Total Time spent with patient: 1 hour  Past Psychiatric History: Patient stated that she had a psychiatric hospitalization 10 years ago, and is an outpatient psychiatric treatment as well as therapy.  Is the patient at risk to self? Yes.    Has the patient been a risk to self in the past 6 months? Yes.    Has the patient been a risk to self within the distant past? No.  Is the patient a risk to others? No.  Has the patient been a risk to others in the past 6 months? No.  Has the patient been a risk to others within the distant past? No.   Prior Inpatient Therapy:   Prior Outpatient Therapy:    Alcohol Screening: 1. How often do you have a drink containing alcohol?: Never 2. How many drinks containing alcohol do you have on a typical day when you are drinking?: 1 or 2 3. How often do you have six or more drinks on one occasion?: Never AUDIT-C Score: 0 4. How often during the last year have you found that you were not able to stop drinking once you had started?: Never 5. How often during the last year have you failed to do what was normally expected from you becasue of drinking?: Never 6. How often during the last year have you needed a first drink in the morning to get yourself going after a heavy drinking session?: Never 7. How often during  the last year have you had a feeling of guilt of remorse after drinking?: Never 8. How often during the last year have you been unable to remember what happened the night before because you had been drinking?: Never 9. Have you or someone else been injured as a result of your drinking?: No 10. Has a relative or friend or a doctor or another health worker been concerned about your  drinking or suggested you cut down?: No Alcohol Use Disorder Identification Test Final Score (AUDIT): 0 Intervention/Follow-up: AUDIT Score <7 follow-up not indicated Substance Abuse History in the last 12 months:  No. Consequences of Substance Abuse: Negative Previous Psychotropic Medications: Yes  Psychological Evaluations: Yes  Past Medical History:  Past Medical History:  Diagnosis Date  . Anxiety   . Arthritis    "knees" (11/02/2015)  . Breast cancer ([place]) 2017   left breast   . Breast cancer, left breast ([place]) 09/30/2015  . Depression   . Diabetes mellitus, type II ([place])   . Diverticulitis 1987  . Hypertension   . Irritable bowel syndrome (IBS)   . Sleep apnea    does not use CPAP but "I'm suppose to" (11/02/2015)    Past Surgical History:  Procedure Laterality Date  . BREAST BIOPSY Left 09/2015  . BREAST LUMPECTOMY Left 11/02/2015  . BREAST LUMPECTOMY WITH AXILLARY LYMPH NODE BIOPSY Left 11/02/2015   BREAST LUMPECTOMY WITH BRACKETED RADIOACTIVE SEED AND LEFT AXILLARY SENTINEL LYMPH NODE BIOPSY   . BREAST LUMPECTOMY WITH RADIOACTIVE SEED AND SENTINEL LYMPH NODE BIOPSY Left 11/02/2015   Procedure: LEFT BREAST LUMPECTOMY WITH BRACKETED RADIOACTIVE SEED AND LEFT AXILLARY SENTINEL LYMPH NODE BIOPSY;  Surgeon: [name], MD;  Location: [facility];  Service: General;  Laterality: Left;  . COLONOSCOPY N/A 12/09/2015   Procedure: COLONOSCOPY;  Surgeon: [name], MD;  [facility];  Service: Endoscopy;  Laterality: N/A;  1:00  . EVACUATION BREAST HEMATOMA Left 11/02/2015  . EVACUATION BREAST HEMATOMA Left 11/02/2015   Procedure: EVACUATION HEMATOMA BREAST;  Surgeon: [name], MD;  Location: [facility];  Service: General;  Laterality: Left;  . JOINT REPLACEMENT    . KNEE ARTHROSCOPY Right 2000s  . LAPAROSCOPIC CHOLECYSTECTOMY  1995  . POLYPECTOMY  12/09/2015   Procedure: POLYPECTOMY;  Surgeon: [name], MD;   [facility];  Service: Endoscopy;;  multiple  . TOTAL KNEE ARTHROPLASTY Right 2011   Family History:  Family History  Problem Relation Age of Onset  . Anxiety disorder Father   . Depression Father   . Other Father        Abestosis  . Depression Sister   . Diabetes Mother    Family Psychiatric  History: She stated that multiple members of her family have psychiatric problems. Tobacco Screening: Have you used any form of tobacco in the last 30 days? (Cigarettes, Smokeless Tobacco, Cigars, and/or Pipes): No Social History:  Social History   Substance and Sexual Activity  Alcohol Use Not Currently     Social History   Substance and Sexual Activity  Drug Use Yes  . Types: Marijuana   Comment: 11/02/2015 "drug use in my past; now maybe 2 puffs 3-4 times/year; for social anxiety"    Additional Social History: Marital status: Divorced Divorced, when?: 20 years What types of issues is patient dealing with in the relationship?: none Are you sexually active?: No What is your sexual orientation?: heterosexual Has your sexual activity been affected by drugs, alcohol, medication,  or emotional stress?: na Does patient have children?: Yes How many children?: 1 How is patient's relationship with their children?: daughter: pretty good relationship                         Allergies:   Allergies  Allergen Reactions  . Adhesive [Tape] Rash  . Sulfa Antibiotics Nausea And Vomiting   Lab Results:  Results for orders placed or performed during the hospital encounter of 06/05/17 (from the past 48 hour(s))  Glucose, capillary     Status: Abnormal   Collection Time: 06/06/17  6:04 AM  Result Value Ref Range   Glucose-Capillary 137 (H) 65 - 99 mg/dL    Blood Alcohol level:  Lab Results  Component Value Date   ETH <10 06/05/2017   ETH  01/20/2010    <5        LOWEST DETECTABLE LIMIT FOR SERUM ALCOHOL IS 5 mg/dL FOR MEDICAL PURPOSES ONLY    Metabolic Disorder  Labs:  No results found for: HGBA1C, MPG No results found for: PROLACTIN No results found for: CHOL, TRIG, HDL, CHOLHDL, VLDL, LDLCALC  Current Medications: Current Facility-Administered Medications  Medication Dose Route Frequency Provider Last Rate Last Dose  . acetaminophen (TYLENOL) tablet 650 mg  650 mg Oral Q6H PRN Patrecia Pour, NP      . alum & mag hydroxide-simeth (MAALOX/MYLANTA) 200-200-20 MG/5ML suspension 30 mL  30 mL Oral Q4H PRN Patrecia Pour, NP      . atorvastatin (LIPITOR) tablet 10 mg  10 mg Oral Daily Patrecia Pour, NP   10 mg at 06/06/17 0759  . DULoxetine (CYMBALTA) DR capsule 60 mg  60 mg Oral BID Sharma Covert, MD      . gabapentin (NEURONTIN) capsule 300 mg  300 mg Oral QHS Patrecia Pour, NP   300 mg at 06/05/17 2140  . letrozole [facility]) tablet 2.5 mg  2.5 mg Oral Daily Lord, Jamison Y, NP      . lisinopril (PRINIVIL,ZESTRIL) tablet 20 mg  20 mg Oral Daily Patrecia Pour, NP   20 mg at 06/06/17 0759  . magnesium hydroxide (MILK OF MAGNESIA) suspension 30 mL  30 mL Oral Daily PRN Patrecia Pour, NP      . metFORMIN (GLUCOPHAGE) tablet 1,000 mg  1,000 mg Oral BID WC Sharma Covert, MD      . traZODone (DESYREL) tablet 50 mg  50 mg Oral QHS,MR X 1 Lindon Romp A, NP   50 mg at 06/05/17 2140   PTA Medications: Medications Prior to Admission  Medication Sig Dispense Refill Last Dose  . acetaminophen (TYLENOL) 500 MG tablet Take 1,000 mg by mouth every 6 (six) hours as needed for moderate pain or headache.   06/04/2017 at Unknown time  . atorvastatin (LIPITOR) 10 MG tablet Take 10 mg by mouth daily.   06/04/2017 at Unknown time  . DULoxetine (CYMBALTA) 30 MG capsule 90 mg (60 mg + 30 mg) daily 90 capsule 0 06/04/2017 at Unknown time  . DULoxetine (CYMBALTA) 60 MG capsule 90 mg (60 mg + 30 mg) daily 90 capsule 0 06/04/2017 at Unknown time  . gabapentin (NEURONTIN) 300 MG capsule Take 1 capsule (300 mg total) by mouth at bedtime. 90 capsule 1 06/04/2017 at Unknown  time  . letrozole (FEMARA) 2.5 MG tablet Take 1 tablet (2.5 mg total) by mouth daily. 90 tablet 3 06/04/2017 at Unknown time  . lisinopril-hydrochlorothiazide (PRINZIDE,ZESTORETIC) 20-12.5 MG  tablet Take 1 tablet by mouth daily.  6 06/04/2017 at Unknown time  . metFORMIN (GLUCOPHAGE) 500 MG tablet Take 1,000 mg by mouth 2 (two) times daily with a meal.    06/04/2017 at Unknown time    Musculoskeletal: Strength & Muscle Tone: within normal limits Gait & Station: normal Patient leans: N/A  Psychiatric Specialty Exam: Physical Exam  Nursing note and vitals reviewed. Constitutional: She appears well-developed and well-nourished.  HENT:  Head: Atraumatic.  Respiratory: Effort normal.  Musculoskeletal: Normal range of motion.    ROS  Blood pressure 100/67, pulse (!) 114, temperature (!) 97.1 F (36.2 C), temperature source Oral, resp. rate 18, height 5\' 7"  (1.702 m), weight 124.7 kg (275 lb).Body mass index is 43.07 kg/m.  General Appearance: Casual  Eye Contact:  Fair  Speech:  Normal Rate  Volume:  Normal  Mood:  Anxious and Depressed  Affect:  Appropriate  Thought Process:  Coherent  Orientation:  Full (Time, Place, and Person)  Thought Content:  Logical  Suicidal Thoughts:  No  Homicidal Thoughts:  No  Memory:  Immediate;   Good Recent;   Fair Remote;   Fair  Judgement:  Intact  Insight:  Fair  Psychomotor Activity:  Increased  Concentration:  Concentration: Fair and Attention Span: Fair  Recall:  [employer] of Knowledge:  Fair  Language:  Fair  Akathisia:  Negative  Handed:  Right  AIMS (if indicated):     Assets:  Communication Skills Desire for Improvement Housing Resilience Social Support  ADL's:  Intact  Cognition:  WNL  Sleep:  Number of Hours: 6.75    Treatment Plan Summary: Daily contact with patient to assess and evaluate symptoms and progress in treatment, Medication management and Plan Patient is a 57 year old female with the above-stated past psychiatric  history who was admitted due to suicidal ideation after an intentional overdose of Klonopin.  She seems to be having no problems at this point with regards to the Klonopin overdose.  She is on duloxetine 90 mg p.o. daily, and I am going to increase this to 60 mg p.o. twice daily.  Her blood pressure is been relatively low during the course of the hospitalization, and I am going to hold her hydrochlorothiazide but continue her lisinopril 20 mg p.o. daily.  She stated her outpatient dosage of Metformin was at thousand milligrams p.o. twice daily, and I will change that.  We will check daily blood sugars on her.  She will be integrated into the milieu.  She will be placed on 15-minute checks.  She will meet with social work both individually and in groups.  She will be encouraged to attend groups to work on her coping skills.  Observation Level/Precautions:  15 minute checks  Laboratory:  Chemistry Profile  Psychotherapy:    Medications:    Consultations:    Discharge Concerns:    Estimated LOS:  Other:     Physician Treatment Plan for Primary Diagnosis: <principal problem not specified> Long Term Goal(s): Improvement in symptoms so as ready for discharge  Short Term Goals: Ability to identify changes in lifestyle to reduce recurrence of condition will improve, Ability to verbalize feelings will improve, Ability to disclose and discuss suicidal ideas, Ability to demonstrate self-control will improve, Ability to identify and develop effective coping behaviors will improve and Ability to maintain clinical measurements within normal limits will improve  Physician Treatment Plan for Secondary Diagnosis: Active Problems:   Major depressive disorder, recurrent severe without psychotic features (  [place])  Long Term Goal(s): Improvement in symptoms so as ready for discharge  Short Term Goals: Ability to identify changes in lifestyle to reduce recurrence of condition will improve, Ability to verbalize feelings  will improve, Ability to disclose and discuss suicidal ideas, Ability to demonstrate self-control will improve, Ability to identify and develop effective coping behaviors will improve and Ability to maintain clinical measurements within normal limits will improve  I certify that inpatient services furnished can reasonably be expected to improve the patient's condition.    Sharma Covert, MD 6/4/20193:43 PM

## 2017-06-06 NOTE — Progress Notes (Signed)
Pt presents with a flat and depressed mood. Pt appeared sad and tearful on approach as she explained to Probation officer that she was IVC'd after she willingly came in for help. Pt verbalized that she called the crisis hotline for help and was told that she either come in for help or will be IVC'd. Pt is unsure as to why she was IVC'd if she willingly came to the hospital for treatment. Pt appears to minimize her symptoms as she denies having any depression or anxiety. Pt denies SI/HI. Pt stated goal for today is "tools to deal with my family that excludes me and go home". Pt b/p low this morning. Writer encouraged fluids and provided pt with Gatorade. Writer reassessed pt's b/p and pt noted to be hypotensive. MD made aware. Pt b/p meds held.   Orders reviewed with pt. Verbal support provided. 15 minute checks performed for safety. Suicide risk assessment completed, low risk. Will continue to monitor b/p.   Pt compliant with tx plan. No concerns verbalized by pt.

## 2017-06-06 NOTE — [facility] Suicide Risk Assessment (Signed)
[name] Admission Suicide Risk Assessment   Nursing information obtained from:  Patient Demographic factors:  Divorced or widowed Current Mental Status:  Suicidal ideation indicated by patient, Self-harm thoughts, Self-harm behaviors Loss Factors:  Decline in physical health Historical Factors:  Prior suicide attempts Risk Reduction Factors:  Sense of responsibility to family, Positive coping skills or problem solving skills  Total Time spent with patient: 30 minutes Principal Problem: <principal problem not specified> Diagnosis:   Patient Active Problem List   Diagnosis Date Noted  . Major depressive disorder, recurrent severe without psychotic features ([place]) [F33.2] 06/05/2017  . Malignant neoplasm of upper-inner quadrant of left breast in female, estrogen receptor positive ([place]) [W09.811, Z17.0] 10/14/2015  . Diabetes ([place]) [E11.9] 09/24/2015  . Hypertension [I10]    Subjective Data: Patient is seen and examined.  Patient is a 57 year old female with a past psychiatric history significant for major depression who presented to the [name] emergency department after an intentional overdose of 10-13 1 mg Klonopin tablets.  The patient stated that she had gotten into a disagreement with her family members.  She feels as though her family members look down on her.  She cares for her mother as well, and she describes her mother's hateful.  She felt as though everything came to ahead last night.  She was overwhelmed, and took the Klonopin tablets and attempt to "go to sleep".  She just wanted to speak to someone and called the suicide hotline.  Police were sent to her home and the safety check.  They took her to the hospital.  She is very upset that she is under involuntary commitment.  Her last psychiatric hospitalization was over 10 years ago.  She does have an outpatient psychiatrist as well as a therapist.  She currently denies suicidal ideation and feels bad about the attempt.  She was admitted  to the hospital for evaluation and stabilization.  Continued Clinical Symptoms:  Alcohol Use Disorder Identification Test Final Score (AUDIT): 0 The "Alcohol Use Disorders Identification Test", Guidelines for Use in Primary Care, Second Edition.  [name]). Score between 0-7:  no or low risk or alcohol related problems. Score between 8-15:  moderate risk of alcohol related problems. Score between 16-19:  high risk of alcohol related problems. Score 20 or above:  warrants further diagnostic evaluation for alcohol dependence and treatment.   CLINICAL FACTORS:   Depression:   Anhedonia Hopelessness Impulsivity Chronic Pain   Musculoskeletal: Strength & Muscle Tone: within normal limits Gait & Station: normal Patient leans: N/A  Psychiatric Specialty Exam: Physical Exam  Nursing note and vitals reviewed. Constitutional: She appears well-developed and well-nourished.  HENT:  Head: Atraumatic.  Respiratory: Effort normal.  Musculoskeletal: Normal range of motion.    ROS  Blood pressure 95/77, pulse (!) 107, temperature (!) 97.1 F (36.2 C), temperature source Oral, resp. rate 18, height 5\' 7"  (1.702 m), weight 124.7 kg (275 lb).Body mass index is 43.07 kg/m.  General Appearance: Disheveled  Eye Contact:  Fair  Speech:  Normal Rate  Volume:  Decreased  Mood:  Depressed  Affect:  Congruent  Thought Process:  Coherent  Orientation:  Full (Time, Place, and Person)  Thought Content:  Logical  Suicidal Thoughts:  No  Homicidal Thoughts:  No  Memory:  Immediate;   Fair Recent;   Fair Remote;   Fair  Judgement:  Intact  Insight:  Lacking  Psychomotor Activity:  Increased  Concentration:  Concentration: Fair and Attention Span: Fair  Recall:  Good  Fund of Knowledge:  Good  Language:  Fair  Akathisia:  Negative  Handed:  Right  AIMS (if indicated):     Assets:  Communication Skills Desire for Improvement Housing Resilience Transportation  ADL's:   Intact  Cognition:  WNL  Sleep:  Number of Hours: 6.75      COGNITIVE FEATURES THAT CONTRIBUTE TO RISK:  None    SUICIDE RISK:   Minimal: No identifiable suicidal ideation.  Patients presenting with no risk factors but with morbid ruminations; may be classified as minimal risk based on the severity of the depressive symptoms  PLAN OF CARE: The patient is seen and examined.  Patient is a 57 year old female with the above-stated past psychiatric history was admitted after an intentional overdose of Klonopin.  She regrets the attempt.  She feels as though it was a "stupid thing to do".  She is tearful and depressed.  I am going to increase her Cymbalta to 60 mg p.o. twice daily.  Additionally her metformin was recently increased to thousand milligrams p.o. twice daily.  We will do that and also add daily blood sugars.  We will continue her blood pressure medicine as well as her medications for her breast cancer.  We will also continue her gabapentin.  We will encourage her to attend groups.  We will encourage her to work on her coping skills.  We will get collateral information from her family.  I certify that inpatient services furnished can reasonably be expected to improve the patient's condition.   Sharma Covert, MD 06/06/2017, 8:14 AM

## 2017-06-06 NOTE — [facility] Group Notes (Signed)
LCSW Group Therapy Note 06/06/2017 1:06 PM  Type of Therapy/Topic: Group Therapy: Feelings about Diagnosis  Participation Level: Active   Description of Group:  This group will allow patients to explore their thoughts and feelings about diagnoses they have received. Patients will be guided to explore their level of understanding and acceptance of these diagnoses. Facilitator will encourage patients to process their thoughts and feelings about the reactions of others to their diagnosis and will guide patients in identifying ways to discuss their diagnosis with significant others in their lives. This group will be process-oriented, with patients participating in exploration of their own experiences, giving and receiving support, and processing challenge from other group members.  Therapeutic Goals: 1. Patient will demonstrate understanding of diagnosis as evidenced by identifying two or more symptoms of the disorder 2. Patient will be able to express two feelings regarding the diagnosis 3. Patient will demonstrate their ability to communicate their needs through discussion and/or role play  Summary of Patient Progress:  Murlene was engaged and participated during her stay in the group session. Keondra reports that although she has a mental health diagnosis, she does not feel that it defines who she is as a person.    Therapeutic Modalities:  Cognitive Behavioral Therapy Brief Therapy Feelings Identification    [place] Clinical Social Worker

## 2017-06-06 NOTE — [facility] Suicide Risk Assessment (Signed)
[place] INPATIENT:  Family/Significant Other Suicide Prevention Education  Suicide Prevention Education:  Education Completed; Kylin Genna, daughter, [phone], has been identified by the patient as the family member/significant other with whom the patient will be residing, and identified as the person(s) who will aid the patient in the event of a mental health crisis (suicidal ideations/suicide attempt).  With written consent from the patient, the family member/significant other has been provided the following suicide prevention education, prior to the and/or following the discharge of the patient.  The suicide prevention education provided includes the following:  Suicide risk factors  Suicide prevention and interventions  National Suicide Hotline telephone number  [facility] assessment telephone number  [facility] Emergency Assistance [place] and/or Residential Mobile Crisis Unit telephone number  Request made of family/significant other to:  Remove weapons (e.g., guns, rifles, knives), all items previously/currently identified as safety concern.  No guns in the home, per [place].  Remove drugs/medications (over-the-counter, prescriptions, illicit drugs), all items previously/currently identified as a safety concern.  The family member/significant other verbalizes understanding of the suicide prevention education information provided.  The family member/significant other agrees to remove the items of safety concern listed above.  Benjie Karvonen confirmed that pt gets very upset regarding family issues, which led to the current overdose.  Benjie Karvonen speaks to her daily and will continue to support.  Joanne Chars, LCSW 06/06/2017, 3:39 PM

## 2017-06-07 LAB — GLUCOSE, CAPILLARY: Glucose-Capillary: 142 mg/dL — ABNORMAL HIGH (ref 65–99)

## 2017-06-07 MED ORDER — LORATADINE 10 MG PO TABS
10.0000 mg | ORAL_TABLET | Freq: Every day | ORAL | Status: DC
  Administered 2017-06-07 – 2017-06-08 (×2): 10 mg via ORAL
  Filled 2017-06-07 (×5): qty 1

## 2017-06-07 NOTE — Progress Notes (Signed)
Recreation Therapy Notes  Date: 6.5.19 Time: 0930 Location: [address] Dayroom  Group Topic: Stress Management  Goal Area(s) Addresses:  Patient will verbalize importance of using healthy stress management.  Patient will identify positive emotions associated with healthy stress management.   Intervention: Stress Management  Activity :  Guided Imagery.  LRT introduced patients to the stress management technique of guided imagery.  LRT read a script that lead patients on mental vacation to their peaceful place.  Patients were to follow along as the script was read to engage in the activity.  Education:  Stress Management, Discharge Planning.   Education Outcome: Acknowledges edcuation/In group clarification offered/Needs additional education  Clinical Observations/Feedback:  Pt did not attend group.     Victorino Sparrow, LRT/CTRS         Ria Comment, Judyth Demarais A 06/07/2017 11:04 AM

## 2017-06-07 NOTE — Tx Team (Signed)
Interdisciplinary Treatment and Diagnostic Plan Update  06/07/2017 Time of Session: [place] MRN: [id number]  Principal Diagnosis: <principal problem not specified>  Secondary Diagnoses: Active Problems:   Major depressive disorder, recurrent severe without psychotic features ([place])   Current Medications:  Current Facility-Administered Medications  Medication Dose Route Frequency Provider Last Rate Last Dose  . acetaminophen (TYLENOL) tablet 650 mg  650 mg Oral Q6H PRN Patrecia Pour, NP   650 mg at 06/06/17 2105  . alum & mag hydroxide-simeth (MAALOX/MYLANTA) 200-200-20 MG/5ML suspension 30 mL  30 mL Oral Q4H PRN Patrecia Pour, NP      . atorvastatin (LIPITOR) tablet 10 mg  10 mg Oral Daily Patrecia Pour, NP   10 mg at 06/07/17 0824  . DULoxetine (CYMBALTA) DR capsule 60 mg  60 mg Oral BID Sharma Covert, MD   60 mg at 06/07/17 0825  . gabapentin (NEURONTIN) capsule 300 mg  300 mg Oral QHS Patrecia Pour, NP   300 mg at 06/06/17 2105  . letrozole [facility]) tablet 2.5 mg  2.5 mg Oral Daily Patrecia Pour, NP   2.5 mg at 06/07/17 0905  . lisinopril (PRINIVIL,ZESTRIL) tablet 20 mg  20 mg Oral Daily Patrecia Pour, NP   20 mg at 06/07/17 0824  . loratadine (CLARITIN) tablet 10 mg  10 mg Oral Daily Sharma Covert, MD   10 mg at 06/07/17 5465  . magnesium hydroxide (MILK OF MAGNESIA) suspension 30 mL  30 mL Oral Daily PRN Patrecia Pour, NP      . metFORMIN (GLUCOPHAGE) tablet 1,000 mg  1,000 mg Oral BID WC Sharma Covert, MD   1,000 mg at 06/07/17 0824  . traZODone (DESYREL) tablet 50 mg  50 mg Oral QHS,MR X 1 Lindon Romp A, NP   50 mg at 06/06/17 2105   PTA Medications: Medications Prior to Admission  Medication Sig Dispense Refill Last Dose  . acetaminophen (TYLENOL) 500 MG tablet Take 1,000 mg by mouth every 6 (six) hours as needed for moderate pain or headache.   06/04/2017 at Unknown time  . atorvastatin (LIPITOR) 10 MG tablet Take 10 mg by mouth daily.    06/04/2017 at Unknown time  . DULoxetine (CYMBALTA) 30 MG capsule 90 mg (60 mg + 30 mg) daily 90 capsule 0 06/04/2017 at Unknown time  . DULoxetine (CYMBALTA) 60 MG capsule 90 mg (60 mg + 30 mg) daily 90 capsule 0 06/04/2017 at Unknown time  . gabapentin (NEURONTIN) 300 MG capsule Take 1 capsule (300 mg total) by mouth at bedtime. 90 capsule 1 06/04/2017 at Unknown time  . letrozole (FEMARA) 2.5 MG tablet Take 1 tablet (2.5 mg total) by mouth daily. 90 tablet 3 06/04/2017 at Unknown time  . lisinopril-hydrochlorothiazide (PRINZIDE,ZESTORETIC) 20-12.5 MG tablet Take 1 tablet by mouth daily.  6 06/04/2017 at Unknown time  . metFORMIN (GLUCOPHAGE) 500 MG tablet Take 1,000 mg by mouth 2 (two) times daily with a meal.    06/04/2017 at Unknown time    Patient Stressors: Health problems Marital or family conflict  Patient Strengths: Ability for insight Average or above average intelligence Capable of independent living General fund of knowledge  Treatment Modalities: Medication Management, Group therapy, Case management,  1 to 1 session with clinician, Psychoeducation, Recreational therapy.   Physician Treatment Plan for Primary Diagnosis: <principal problem not specified> Long Term Goal(s): Improvement in symptoms so as ready for discharge Improvement in symptoms so as ready for discharge  Short Term Goals: Ability to identify changes in lifestyle to reduce recurrence of condition will improve Ability to verbalize feelings will improve Ability to disclose and discuss suicidal ideas Ability to demonstrate self-control will improve Ability to identify and develop effective coping behaviors will improve Ability to maintain clinical measurements within normal limits will improve Ability to identify changes in lifestyle to reduce recurrence of condition will improve Ability to verbalize feelings will improve Ability to disclose and discuss suicidal ideas Ability to demonstrate self-control will  improve Ability to identify and develop effective coping behaviors will improve Ability to maintain clinical measurements within normal limits will improve  Medication Management: Evaluate patient's response, side effects, and tolerance of medication regimen.  Therapeutic Interventions: 1 to 1 sessions, Unit Group sessions and Medication administration.  Evaluation of Outcomes: Progressing  Physician Treatment Plan for Secondary Diagnosis: Active Problems:   Major depressive disorder, recurrent severe without psychotic features ([place])  Long Term Goal(s): Improvement in symptoms so as ready for discharge Improvement in symptoms so as ready for discharge   Short Term Goals: Ability to identify changes in lifestyle to reduce recurrence of condition will improve Ability to verbalize feelings will improve Ability to disclose and discuss suicidal ideas Ability to demonstrate self-control will improve Ability to identify and develop effective coping behaviors will improve Ability to maintain clinical measurements within normal limits will improve Ability to identify changes in lifestyle to reduce recurrence of condition will improve Ability to verbalize feelings will improve Ability to disclose and discuss suicidal ideas Ability to demonstrate self-control will improve Ability to identify and develop effective coping behaviors will improve Ability to maintain clinical measurements within normal limits will improve     Medication Management: Evaluate patient's response, side effects, and tolerance of medication regimen.  Therapeutic Interventions: 1 to 1 sessions, Unit Group sessions and Medication administration.  Evaluation of Outcomes: Progressing   RN Treatment Plan for Primary Diagnosis: <principal problem not specified> Long Term Goal(s): Knowledge of disease and therapeutic regimen to maintain health will improve  Short Term Goals: Ability to identify and develop effective coping  behaviors will improve and Compliance with prescribed medications will improve  Medication Management: RN will administer medications as ordered by provider, will assess and evaluate patient's response and provide education to patient for prescribed medication. RN will report any adverse and/or side effects to prescribing provider.  Therapeutic Interventions: 1 on 1 counseling sessions, Psychoeducation, Medication administration, Evaluate responses to treatment, Monitor vital signs and CBGs as ordered, Perform/monitor CIWA, COWS, AIMS and Fall Risk screenings as ordered, Perform wound care treatments as ordered.  Evaluation of Outcomes: Progressing   LCSW Treatment Plan for Primary Diagnosis: <principal problem not specified> Long Term Goal(s): Safe transition to appropriate next level of care at discharge, Engage patient in therapeutic group addressing interpersonal concerns.  Short Term Goals: Engage patient in aftercare planning with referrals and resources, Increase social support and Increase skills for wellness and recovery  Therapeutic Interventions: Assess for all discharge needs, 1 to 1 time with Social worker, Explore available resources and support systems, Assess for adequacy in community support network, Educate family and significant other(s) on suicide prevention, Complete Psychosocial Assessment, Interpersonal group therapy.  Evaluation of Outcomes: Progressing   Progress in Treatment: Attending groups: Yes. Participating in groups: Yes. Taking medication as prescribed: Yes. Toleration medication: Yes. Family/Significant other contact made: Yes, individual(s) contacted:  daughter Patient understands diagnosis: Yes. Discussing patient identified problems/goals with staff: Yes. Medical problems stabilized or resolved: Yes. Denies suicidal/homicidal  ideation: Yes. Issues/concerns per patient self-inventory: No. Other: none  New problem(s) identified: No, Describe:   none  New Short Term/Long Term Goal(s):  Patient Goals:  "get a refresher on my coping skills"  Discharge Plan or Barriers:   Reason for Continuation of Hospitalization: Depression Medication stabilization  Estimated Length of Stay: 1-2 days.  Attendees: Patient: Lindsay Pacheco 06/07/2017   Physician: Dr Mallie Darting, MD 06/07/2017   Nursing: Alison Murray, RN 06/07/2017   RN Care Manager: 06/07/2017   Social Worker: Lurline Idol, LCSW 06/07/2017   Recreational Therapist:  06/07/2017   Other:  06/07/2017   Other:  06/07/2017   Other: 06/07/2017        Scribe for Treatment Team: Joanne Chars, LCSW 06/07/2017 1:19 PM

## 2017-06-07 NOTE — Progress Notes (Signed)
[facility] MD Progress Note  06/07/2017 1:26 PM Lindsay Pacheco  MRN:  [id number] Subjective: Patient is seen and examined.  Patient is a 57 year old female with past psychiatric history significant for major depression.  She is seen in follow-up.  She is doing much better.  She denied any suicidal ideation.  She is attended groups, and been interacting appropriately with everyone.  She is denied any suicidal ideation throughout the course of the hospitalization.  We discussed today possible discharge in next 1 to 2 days. Principal Problem: <principal problem not specified> Diagnosis:   Patient Active Problem List   Diagnosis Date Noted  . Major depressive disorder, recurrent severe without psychotic features ([place]) [F33.2] 06/05/2017  . Malignant neoplasm of upper-inner quadrant of left breast in female, estrogen receptor positive ([place]) [U44.034, Z17.0] 10/14/2015  . Diabetes ([place]) [E11.9] 09/24/2015  . Hypertension [I10]    Total Time spent with patient: 20 minutes  Past Psychiatric History: See admission H&P  Past Medical History:  Past Medical History:  Diagnosis Date  . Anxiety   . Arthritis    "knees" (11/02/2015)  . Breast cancer ([place]) 2017   left breast   . Breast cancer, left breast ([place]) 09/30/2015  . Depression   . Diabetes mellitus, type II ([place])   . Diverticulitis 1987  . Hypertension   . Irritable bowel syndrome (IBS)   . Sleep apnea    does not use CPAP but "I'm suppose to" (11/02/2015)    Past Surgical History:  Procedure Laterality Date  . BREAST BIOPSY Left 09/2015  . BREAST LUMPECTOMY Left 11/02/2015  . BREAST LUMPECTOMY WITH AXILLARY LYMPH NODE BIOPSY Left 11/02/2015   BREAST LUMPECTOMY WITH BRACKETED RADIOACTIVE SEED AND LEFT AXILLARY SENTINEL LYMPH NODE BIOPSY   . BREAST LUMPECTOMY WITH RADIOACTIVE SEED AND SENTINEL LYMPH NODE BIOPSY Left 11/02/2015   Procedure: LEFT BREAST LUMPECTOMY WITH BRACKETED RADIOACTIVE SEED AND LEFT AXILLARY SENTINEL LYMPH NODE BIOPSY;   Surgeon: [name], MD;  Location: [facility];  Service: General;  Laterality: Left;  . COLONOSCOPY N/A 12/09/2015   Procedure: COLONOSCOPY;  Surgeon: [name], MD;  [facility];  Service: Endoscopy;  Laterality: N/A;  1:00  . EVACUATION BREAST HEMATOMA Left 11/02/2015  . EVACUATION BREAST HEMATOMA Left 11/02/2015   Procedure: EVACUATION HEMATOMA BREAST;  Surgeon: [name], MD;  Location: [facility];  Service: General;  Laterality: Left;  . JOINT REPLACEMENT    . KNEE ARTHROSCOPY Right 2000s  . LAPAROSCOPIC CHOLECYSTECTOMY  1995  . POLYPECTOMY  12/09/2015   Procedure: POLYPECTOMY;  Surgeon: [name], MD;  [facility];  Service: Endoscopy;;  multiple  . TOTAL KNEE ARTHROPLASTY Right 2011   Family History:  Family History  Problem Relation Age of Onset  . Anxiety disorder Father   . Depression Father   . Other Father        Abestosis  . Depression Sister   . Diabetes Mother    Family Psychiatric  History: See admission H&P Social History:  Social History   Substance and Sexual Activity  Alcohol Use Not Currently     Social History   Substance and Sexual Activity  Drug Use Yes  . Types: Marijuana   Comment: 11/02/2015 "drug use in my past; now maybe 2 puffs 3-4 times/year; for social anxiety"    Social History   Socioeconomic History  . Marital status: Divorced    Spouse name: Not on file  . Number of children:  Not on file  . Years of education: Not on file  . Highest education level: Not on file  Occupational History  . Not on file  Social Needs  . Financial resource strain: Not on file  . Food insecurity:    Worry: Not on file    Inability: Not on file  . Transportation needs:    Medical: Not on file    Non-medical: Not on file  Tobacco Use  . Smoking status: Never Smoker  . Smokeless tobacco: Never Used  Substance and Sexual Activity  . Alcohol use: Not Currently  . Drug use:  Yes    Types: Marijuana    Comment: 11/02/2015 "drug use in my past; now maybe 2 puffs 3-4 times/year; for social anxiety"  . Sexual activity: Not Currently  Lifestyle  . Physical activity:    Days per week: Not on file    Minutes per session: Not on file  . Stress: Not on file  Relationships  . Social connections:    Talks on phone: Not on file    Gets together: Not on file    Attends religious service: Not on file    Active member of club or organization: Not on file    Attends meetings of clubs or organizations: Not on file    Relationship status: Not on file  Other Topics Concern  . Not on file  Social History Narrative  . Not on file   Additional Social History:                         Sleep: Good  Appetite:  Fair  Current Medications: Current Facility-Administered Medications  Medication Dose Route Frequency Provider Last Rate Last Dose  . acetaminophen (TYLENOL) tablet 650 mg  650 mg Oral Q6H PRN Patrecia Pour, NP   650 mg at 06/06/17 2105  . alum & mag hydroxide-simeth (MAALOX/MYLANTA) 200-200-20 MG/5ML suspension 30 mL  30 mL Oral Q4H PRN Patrecia Pour, NP      . atorvastatin (LIPITOR) tablet 10 mg  10 mg Oral Daily Patrecia Pour, NP   10 mg at 06/07/17 0824  . DULoxetine (CYMBALTA) DR capsule 60 mg  60 mg Oral BID Sharma Covert, MD   60 mg at 06/07/17 0825  . gabapentin (NEURONTIN) capsule 300 mg  300 mg Oral QHS Patrecia Pour, NP   300 mg at 06/06/17 2105  . letrozole [facility]) tablet 2.5 mg  2.5 mg Oral Daily Patrecia Pour, NP   2.5 mg at 06/07/17 0905  . lisinopril (PRINIVIL,ZESTRIL) tablet 20 mg  20 mg Oral Daily Patrecia Pour, NP   20 mg at 06/07/17 0824  . loratadine (CLARITIN) tablet 10 mg  10 mg Oral Daily Sharma Covert, MD   10 mg at 06/07/17 2409  . magnesium hydroxide (MILK OF MAGNESIA) suspension 30 mL  30 mL Oral Daily PRN Patrecia Pour, NP      . metFORMIN (GLUCOPHAGE) tablet 1,000 mg  1,000 mg Oral BID WC Sharma Covert, MD   1,000 mg at 06/07/17 0824  . traZODone (DESYREL) tablet 50 mg  50 mg Oral QHS,MR X 1 Lindon Romp A, NP   50 mg at 06/06/17 2105    Lab Results:  Results for orders placed or performed during the hospital encounter of 06/05/17 (from the past 48 hour(s))  Glucose, capillary     Status: Abnormal   Collection Time: 06/06/17  6:04 AM  Result Value Ref Range   Glucose-Capillary 137 (H) 65 - 99 mg/dL  Glucose, capillary     Status: Abnormal   Collection Time: 06/07/17  6:10 AM  Result Value Ref Range   Glucose-Capillary 142 (H) 65 - 99 mg/dL    Blood Alcohol level:  Lab Results  Component Value Date   ETH <10 06/05/2017   [facility]  01/20/2010    <5        LOWEST DETECTABLE LIMIT FOR SERUM ALCOHOL IS 5 mg/dL FOR MEDICAL PURPOSES ONLY    Metabolic Disorder Labs: No results found for: HGBA1C, MPG No results found for: PROLACTIN No results found for: CHOL, TRIG, HDL, CHOLHDL, VLDL, LDLCALC  Physical Findings: AIMS: Facial and Oral Movements Muscles of Facial Expression: None, normal Lips and Perioral Area: None, normal Jaw: None, normal Tongue: None, normal,Extremity Movements Upper (arms, wrists, hands, fingers): None, normal Lower (legs, knees, ankles, toes): None, normal, Trunk Movements Neck, shoulders, hips: None, normal, Overall Severity Severity of abnormal movements (highest score from questions above): None, normal Incapacitation due to abnormal movements: None, normal Patient's awareness of abnormal movements (rate only patient's report): No Awareness, Dental Status Current problems with teeth and/or dentures?: No Does patient usually wear dentures?: No  CIWA:    COWS:     Musculoskeletal: Strength & Muscle Tone: within normal limits Gait & Station: normal Patient leans: N/A  Psychiatric Specialty Exam: Physical Exam  Constitutional: She is oriented to person, place, and time. She appears well-developed and well-nourished.  HENT:  Head: Normocephalic  and atraumatic.  Respiratory: Effort normal.  Musculoskeletal: Normal range of motion.  Neurological: She is alert and oriented to person, place, and time.    ROS  Blood pressure 120/76, pulse (!) 108, temperature 97.7 F (36.5 C), temperature source Oral, resp. rate 18, height 5\' 7"  (1.702 m), weight 124.7 kg (275 lb).Body mass index is 43.07 kg/m.  General Appearance: Casual  Eye Contact:  Fair  Speech:  Normal Rate  Volume:  Normal  Mood:  Anxious  Affect:  Congruent  Thought Process:  Coherent  Orientation:  Full (Time, Place, and Person)  Thought Content:  Logical  Suicidal Thoughts:  No  Homicidal Thoughts:  No  Memory:  Immediate;   Fair Recent;   Fair Remote;   Fair  Judgement:  Intact  Insight:  Fair  Psychomotor Activity:  Normal  Concentration:  Concentration: Fair and Attention Span: Fair  Recall:  [employer] of Knowledge:  Fair  Language:  Fair  Akathisia:  Negative  Handed:  Right  AIMS (if indicated):     Assets:  Communication Skills Desire for Improvement Housing Resilience Social Support  ADL's:  Intact  Cognition:  WNL  Sleep:  Number of Hours: 6.75     Treatment Plan Summary: Daily contact with patient to assess and evaluate symptoms and progress in treatment, Medication management and Plan Patient is seen and examined.  Patient is a 57 year old female with the above-stated past psychiatric history seen in follow-up.  She continues to do well.  Her duloxetine was increased to 60 mg p.o. twice daily yesterday.  She is tolerating that well.  Her blood sugar is relatively stable.  She is continued on letrozole as well.  No change in her medications today.  Hopefully if all goes we will be able to get her home tomorrow or the day after.  Sharma Covert, MD 06/07/2017, 1:26 PM

## 2017-06-07 NOTE — Progress Notes (Addendum)
Patient ID: Lindsay Pacheco, female   DOB: Feb 17, 1960, 57 y.o.   MRN: [id number] D) Pt has been appropriate and cooperative on approach. Positive for unit activities with minimal prompting. Pt continues to work on identifying appropriate, positive coping skills. Insight and judgement moderate. Pt denies s.i. Stating her day is a 10/10. No c/o pain. A) level 3 obs for safety, support and encouragement provided. Med ed reinforced. R) Cooperative.

## 2017-06-07 NOTE — Therapy (Signed)
Occupational Therapy Group Treatment Note  Date:  06/07/2017 Time:  3:01 PM  Group Topic/Focus:  Stress Management  Participation Level:  Active  Participation Quality:  Appropriate  Affect:  Flat  Cognitive:  Appropriate  Insight: Improving  Engagement in Group:  Engaged  Modes of Intervention:  Activity, Discussion and Education  Additional Comments:    S: "I do some deep breathing"  O: Stress management group completed to use as productive coping strategy, to help mitigate maladaptive coping to integrate in functional BADL/IADL. Education given on the definition of stress and its cognitive, behavioral, emotional, and physical effects on the body. Stress symptom checklist completed. Stress management tool worksheet discussed to educate on unhealthy vs healthy coping skills to manage stress to improve community integration. Education given on use of progressive muscle relaxation. PMR script delivered with relaxing music to facilitate relaxation response to help increase ability to engage in BADL. Education given on sleep hygiene to increase participation in BADL and overall function in IADL. Adult coloring pages given as option at end of group as another productive coping mechanism.  A: Pt presents to group with falt affect initially, improving at end of session- cracking jokes and smiling with other members. Pt initiating conversation, offering examples, and relating to other group members.  Pt completed stress symptom checklist, showing very high signs of stress. Pt engaged in stress management tools worksheet, offering examples and how to improve current practices. Pt reports she would like to continue using relaxation techniques to manage stress. Pt engaged in [place] script, with much enjoyment and significant relaxation response. She states she is eager to use this in common practice, and stated how much she enjoyed the stress management workshop. Pt in understanding of sleep hygiene  education this date. Pt eager to accept handouts of relaxation and adult coloring.  P: Pt provided with education on stress management activities to implement into daily routine. Handouts given to facilitate carryover when reintegrating into community     [facility], [place], OTR/L  [employer] 06/07/2017, 3:01 PM

## 2017-06-07 NOTE — BHH Group Notes (Signed)
[facility] Group Therapy 06/07/2017 1:15pm  Type of Therapy: Mental Health Association Presentation  Participation Level: Invited. Chose to remain in bed.   Avelina Laine, LCSW 06/07/2017 3:05 PM

## 2017-06-08 LAB — GLUCOSE, CAPILLARY: GLUCOSE-CAPILLARY: 133 mg/dL — AB (ref 65–99)

## 2017-06-08 MED ORDER — LISINOPRIL 20 MG PO TABS: 20.0000 mg | ORAL_TABLET | Freq: Every day | ORAL | 0 refills | Status: AC

## 2017-06-08 MED ORDER — TRAZODONE HCL 50 MG PO TABS: 50.0000 mg | ORAL_TABLET | Freq: Every evening | ORAL | 0 refills | Status: DC | PRN

## 2017-06-08 MED ORDER — DULOXETINE HCL 60 MG PO CPEP: 60.0000 mg | ORAL_CAPSULE | Freq: Two times a day (BID) | ORAL | 0 refills | Status: DC

## 2017-06-08 MED ORDER — GABAPENTIN 300 MG PO CAPS: 300.0000 mg | ORAL_CAPSULE | Freq: Every day | ORAL | 0 refills | Status: DC

## 2017-06-08 MED ORDER — METFORMIN HCL 1000 MG PO TABS: 1000.0000 mg | ORAL_TABLET | Freq: Two times a day (BID) | ORAL | 0 refills | Status: DC

## 2017-06-08 MED ORDER — ATORVASTATIN CALCIUM 10 MG PO TABS: 10.0000 mg | ORAL_TABLET | Freq: Every day | ORAL | 0 refills | Status: DC

## 2017-06-08 NOTE — Progress Notes (Signed)
D: Pt was in her room upon initial approach.  She presents with appropriate affect and mood.  Pt smiles frequently and interacts with others appropriately.  When asked about her day, she states "everything has been better."  She states she is "going home tomorrow afternoon" and reports feeling safe to do so.  She denies SI/HI and hallucinations.  Pt reports L knee pain of 2/10.  She attended evening group tonight.  A: Met with pt 1:1 and actively listened to pt.  Support and encouragement provided.  Medication administered per order.  PRN medication administered for pain.  Cold packs provided for pain.  Q15 minute safety checks maintained.  R: Pt is compliant with medications.  She verbally contracts for safety and reports she will inform staff of needs and concerns.  Will continue to monitor and assess.

## 2017-06-08 NOTE — Progress Notes (Signed)
Patient ID: Lindsay Pacheco, female   DOB: April 27, 1960, 57 y.o.   MRN: [id number] Patient discharged to home/self care in the presence of family.  Patient denies SI, HI and AVH upon discharge.  Patient acknowledges understanding of all discharge instructions and receipt of all personal belongings.

## 2017-06-08 NOTE — Progress Notes (Signed)
D:  Patient's self inventory sheet, patient has fair sleep, sleep medication helpful.  Good appetite, normal energy level, good concentration.  Denied depression, anxiety, hopeless.  Denied withdrawals.  Denied SI.  Physical problems, lip blister.  Worst pain #2 in past 24 hours, L knee.  Did not take medication for L knee.  Goal is discharge.  Plans to attend therapy until discharged.  Does have discharge plans. A:  Medications administered per MD orders.  Emotional support and encouragement given patient. R:  Denied SI and HI, contracts for safety.  Denied A/V hallucinations.  Safety maintained with 15 minute checks. Looking forward to discharge.

## 2017-06-08 NOTE — BHH Suicide Risk Assessment (Signed)
[facility] Discharge Suicide Risk Assessment   Principal Problem: <principal problem not specified> Discharge Diagnoses:  Patient Active Problem List   Diagnosis Date Noted  . Major depressive disorder, recurrent severe without psychotic features ([place]) [F33.2] 06/05/2017  . Malignant neoplasm of upper-inner quadrant of left breast in female, estrogen receptor positive ([place]) [Y19.509, Z17.0] 10/14/2015  . Diabetes ([place]) [E11.9] 09/24/2015  . Hypertension [I10]     Total Time spent with patient: 30 minutes  Musculoskeletal: Strength & Muscle Tone: within normal limits Gait & Station: normal Patient leans: N/A  Psychiatric Specialty Exam: Review of Systems  All other systems reviewed and are negative.   Blood pressure 111/71, pulse (!) 110, temperature 97.7 F (36.5 C), temperature source Oral, resp. rate 18, height 5\' 7"  (1.702 m), weight 124.7 kg (275 lb).Body mass index is 43.07 kg/m.  General Appearance: Casual  Eye Contact::  Fair  Speech:  Normal Rate409  Volume:  Normal  Mood:  Euthymic  Affect:  Congruent  Thought Process:  Coherent  Orientation:  Full (Time, Place, and Person)  Thought Content:  Logical  Suicidal Thoughts:  No  Homicidal Thoughts:  No  Memory:  Immediate;   Good Recent;   Good Remote;   Good  Judgement:  Intact  Insight:  Fair  Psychomotor Activity:  Normal  Concentration:  Good  Recall:  Good  Fund of Knowledge:Good  Language: Good  Akathisia:  Negative  Handed:  Right  AIMS (if indicated):     Assets:  Communication Skills Desire for Improvement Financial Resources/Insurance Housing Resilience Social Support  Sleep:  Number of Hours: 6.25  Cognition: WNL  ADL's:  Intact   Mental Status Per Nursing Assessment::   On Admission:  Suicidal ideation indicated by patient, Self-harm thoughts, Self-harm behaviors  Demographic Factors:  Caucasian and Unemployed  Loss Factors: NA  Historical Factors: Impulsivity  Risk Reduction Factors:    Sense of responsibility to family, Living with another person, especially a relative and Positive social support  Continued Clinical Symptoms:  Depression:   Impulsivity  Cognitive Features That Contribute To Risk:  None    Suicide Risk:  Minimal: No identifiable suicidal ideation.  Patients presenting with no risk factors but with morbid ruminations; may be classified as minimal risk based on the severity of the depressive symptoms  Follow-up [facility]. Go on 06/15/2017.   Specialty:  Behavioral Health Why:  Please attend your medication appt on Thursday, 06/15/17, at 4:30pm. Please attend your therapy appt with Maurice Small on 07/21/17, at 11:00am. Contact information: [address] [place] [phone]          Plan Of Care/Follow-up recommendations:  Activity:  ad lib  Sharma Covert, MD 06/08/2017, 7:31 AM

## 2017-06-08 NOTE — Discharge Summary (Signed)
Physician Discharge Summary Note  Patient:  Lindsay Pacheco is an 57 y.o., female MRN:  [id number] DOB:  [birth year] Patient phone:  [phone] (home)  Patient address:   [address] [place] [address],  Total Time spent with patient: 20 minutes  Date of Admission:  06/05/2017 Date of Discharge: 06/08/17  Reason for Admission:  Worsening depression with intentional overdose  Principal Problem: Major depressive disorder, recurrent severe without psychotic features [facility]) Discharge Diagnoses: Patient Active Problem List   Diagnosis Date Noted  . Major depressive disorder, recurrent severe without psychotic features ([place]) [F33.2] 06/05/2017  . Malignant neoplasm of upper-inner quadrant of left breast in female, estrogen receptor positive ([place]) [X93.716, Z17.0] 10/14/2015  . Diabetes ([place]) [E11.9] 09/24/2015  . Hypertension [I10]     Past Psychiatric History: Patient stated that she had a psychiatric hospitalization 10 years ago, and is an outpatient psychiatric treatment as well as therapy  Past Medical History:  Past Medical History:  Diagnosis Date  . Anxiety   . Arthritis    "knees" (11/02/2015)  . Breast cancer ([place]) 2017   left breast   . Breast cancer, left breast ([place]) 09/30/2015  . Depression   . Diabetes mellitus, type II ([place])   . Diverticulitis 1987  . Hypertension   . Irritable bowel syndrome (IBS)   . Sleep apnea    does not use CPAP but "I'm suppose to" (11/02/2015)    Past Surgical History:  Procedure Laterality Date  . BREAST BIOPSY Left 09/2015  . BREAST LUMPECTOMY Left 11/02/2015  . BREAST LUMPECTOMY WITH AXILLARY LYMPH NODE BIOPSY Left 11/02/2015   BREAST LUMPECTOMY WITH BRACKETED RADIOACTIVE SEED AND LEFT AXILLARY SENTINEL LYMPH NODE BIOPSY   . BREAST LUMPECTOMY WITH RADIOACTIVE SEED AND SENTINEL LYMPH NODE BIOPSY Left 11/02/2015   Procedure: LEFT BREAST LUMPECTOMY WITH BRACKETED RADIOACTIVE SEED AND LEFT AXILLARY SENTINEL LYMPH NODE BIOPSY;  Surgeon:  [name], MD;  Location: [facility];  Service: General;  Laterality: Left;  . COLONOSCOPY N/A 12/09/2015   Procedure: COLONOSCOPY;  Surgeon: [name], MD;  [facility];  Service: Endoscopy;  Laterality: N/A;  1:00  . EVACUATION BREAST HEMATOMA Left 11/02/2015  . EVACUATION BREAST HEMATOMA Left 11/02/2015   Procedure: EVACUATION HEMATOMA BREAST;  Surgeon: [name], MD;  Location: [facility];  Service: General;  Laterality: Left;  . JOINT REPLACEMENT    . KNEE ARTHROSCOPY Right 2000s  . LAPAROSCOPIC CHOLECYSTECTOMY  1995  . POLYPECTOMY  12/09/2015   Procedure: POLYPECTOMY;  Surgeon: [name], MD;  [facility];  Service: Endoscopy;;  multiple  . TOTAL KNEE ARTHROPLASTY Right 2011   Family History:  Family History  Problem Relation Age of Onset  . Anxiety disorder Father   . Depression Father   . Other Father        Abestosis  . Depression Sister   . Diabetes Mother    Family Psychiatric  History: She stated that multiple members of her family have psychiatric problems.  Social History:  Social History   Substance and Sexual Activity  Alcohol Use Not Currently     Social History   Substance and Sexual Activity  Drug Use Yes  . Types: Marijuana   Comment: 11/02/2015 "drug use in my past; now maybe 2 puffs 3-4 times/year; for social anxiety"    Social History   Socioeconomic History  . Marital status: Divorced    Spouse name: Not on file  . Number of  children: Not on file  . Years of education: Not on file  . Highest education level: Not on file  Occupational History  . Not on file  Social Needs  . Financial resource strain: Not on file  . Food insecurity:    Worry: Not on file    Inability: Not on file  . Transportation needs:    Medical: Not on file    Non-medical: Not on file  Tobacco Use  . Smoking status: Never Smoker  . Smokeless tobacco: Never Used  Substance and Sexual  Activity  . Alcohol use: Not Currently  . Drug use: Yes    Types: Marijuana    Comment: 11/02/2015 "drug use in my past; now maybe 2 puffs 3-4 times/year; for social anxiety"  . Sexual activity: Not Currently  Lifestyle  . Physical activity:    Days per week: Not on file    Minutes per session: Not on file  . Stress: Not on file  Relationships  . Social connections:    Talks on phone: Not on file    Gets together: Not on file    Attends religious service: Not on file    Active member of club or organization: Not on file    Attends meetings of clubs or organizations: Not on file    Relationship status: Not on file  Other Topics Concern  . Not on file  Social History Narrative  . Not on file    Hospital Course:   06/06/17 [name] Assessment: Patient is seen and examined.  Patient is a 57 year old female with a past psychiatric history significant for major depression who presented to the [facility] emergency department after an intentional overdose of 10-13 1 mg Klonopin tablets.  The patient stated she got into a degree of disagreement with her family members.  She stated she felt as though her family members always look down on her.  She takes care of her mother, and describes her mother's hateful.  She stated she was unable to cope with everything.  She stated she was overwhelmed and took the Klonopin tablets and in an attempt to "go to sleep".  She stated she just wanted to speak to someone and called the suicide hotline.  The police were sent to the home for the safety check.  They took her to the hospital.  She was upset that she was placed under involuntary commitment.  She stated all she wanted to do was to talk with someone.  She stated her last psychiatric hospitalization was over 10 years ago.  She does have an outpatient psychiatrist as well as a therapist.  She denies suicidal ideation on admission and felt bad about her attempt.  She was admitted to the hospital for evaluation  and stabilization.  Patient remained on the [facility] unit for 2 days. The patient stabilized on medication and therapy. Patient was discharged on Cymbalta 60 mg BID, Neurontin 300 QHS, and Trazodone 50 mg QHS PRN. Patient has shown improvement with improved mood, affect, sleep, appetite, and interaction. Patient has attended group and participated. Patient has been seen in the day room interacting with peers and staff appropriately. Patient denies any SI/HI/AVH and contracts for safety. Patient agrees to follow up at [facility]. Patient is provided with prescriptions for their medications upon discharge.     Physical Findings: AIMS: Facial and Oral Movements Muscles of Facial Expression: None, normal Lips and Perioral Area: None, normal Jaw: None, normal Tongue: None, normal,Extremity Movements  Upper (arms, wrists, hands, fingers): None, normal Lower (legs, knees, ankles, toes): None, normal, Trunk Movements Neck, shoulders, hips: None, normal, Overall Severity Severity of abnormal movements (highest score from questions above): None, normal Incapacitation due to abnormal movements: None, normal Patient's awareness of abnormal movements (rate only patient's report): No Awareness, Dental Status Current problems with teeth and/or dentures?: No Does patient usually wear dentures?: No  CIWA:    COWS:     Musculoskeletal: Strength & Muscle Tone: within normal limits Gait & Station: normal Patient leans: N/A  Psychiatric Specialty Exam: Physical Exam  Nursing note and vitals reviewed. Constitutional: She is oriented to person, place, and time. She appears well-developed and well-nourished.  Respiratory: Effort normal.  Musculoskeletal: Normal range of motion.  Neurological: She is alert and oriented to person, place, and time.  Skin: Skin is warm.    Review of Systems  Constitutional: Negative.   HENT: Negative.   Eyes: Negative.   Respiratory: Negative.    Cardiovascular: Negative.   Gastrointestinal: Negative.   Genitourinary: Negative.   Musculoskeletal: Negative.   Skin: Negative.   Neurological: Negative.   Endo/Heme/Allergies: Negative.   Psychiatric/Behavioral: Negative.     Blood pressure 112/82, pulse (!) 108, temperature 97.7 F (36.5 C), temperature source Oral, resp. rate 20, height 5\' 7"  (1.702 m), weight 124.7 kg (275 lb).Body mass index is 43.07 kg/m.  General Appearance: Casual  Eye Contact:  Good  Speech:  Clear and Coherent and Normal Rate  Volume:  Normal  Mood:  Euthymic  Affect:  Appropriate  Thought Process:  Goal Directed and Descriptions of Associations: Intact  Orientation:  Full (Time, Place, and Person)  Thought Content:  WDL  Suicidal Thoughts:  No  Homicidal Thoughts:  No  Memory:  Immediate;   Good Recent;   Good Remote;   Good  Judgement:  Fair  Insight:  Fair  Psychomotor Activity:  Normal  Concentration:  Concentration: Good and Attention Span: Good  Recall:  Good  Fund of Knowledge:  Good  Language:  Good  Akathisia:  No  Handed:  Right  AIMS (if indicated):     Assets:  Communication Skills Desire for Improvement Financial Resources/Insurance Housing Physical Health Social Support Transportation  ADL's:  Intact  Cognition:  WNL  Sleep:  Number of Hours: 6.25     Have you used any form of tobacco in the last 30 days? (Cigarettes, Smokeless Tobacco, Cigars, and/or Pipes): No  Has this patient used any form of tobacco in the last 30 days? (Cigarettes, Smokeless Tobacco, Cigars, and/or Pipes) Yes, No  Blood Alcohol level:  Lab Results  Component Value Date   ETH <10 06/05/2017   [facility]  01/20/2010    <5        LOWEST DETECTABLE LIMIT FOR SERUM ALCOHOL IS 5 mg/dL FOR MEDICAL PURPOSES ONLY    Metabolic Disorder Labs:  No results found for: HGBA1C, MPG No results found for: PROLACTIN No results found for: CHOL, TRIG, HDL, CHOLHDL, VLDL, LDLCALC  See Psychiatric Specialty Exam  and Suicide Risk Assessment completed by Attending Physician prior to discharge.  Discharge destination:  Home  Is patient on multiple antipsychotic therapies at discharge:  No   Has Patient had three or more failed trials of antipsychotic monotherapy by history:  No  Recommended Plan for Multiple Antipsychotic Therapies: NA   Allergies as of 06/08/2017      Reactions   Adhesive [tape] Rash   Sulfa Antibiotics Nausea And Vomiting  Medication List    STOP taking these medications   acetaminophen 500 MG tablet Commonly known as:  TYLENOL   lisinopril-hydrochlorothiazide 20-12.5 MG tablet Commonly known as:  PRINZIDE,ZESTORETIC     TAKE these medications     Indication  atorvastatin 10 MG tablet Commonly known as:  LIPITOR Take 1 tablet (10 mg total) by mouth daily. For high cholesterol What changed:  additional instructions  Indication:  High Amount of Fats in the Blood   DULoxetine 60 MG capsule Commonly known as:  CYMBALTA Take 1 capsule (60 mg total) by mouth 2 (two) times daily. For mood control What changed:    medication strength  how much to take  how to take this  when to take this  additional instructions  Another medication with the same name was removed. Continue taking this medication, and follow the directions you see here.  Indication:  mood stability   gabapentin 300 MG capsule Commonly known as:  NEURONTIN Take 1 capsule (300 mg total) by mouth at bedtime.  Indication:  Neuropathic Pain   letrozole 2.5 MG tablet Commonly known as:  FEMARA Take 1 tablet (2.5 mg total) by mouth daily.  Indication:  Per PCP   lisinopril 20 MG tablet Commonly known as:  PRINIVIL,ZESTRIL Take 1 tablet (20 mg total) by mouth daily. For high blood pressure  Indication:  High Blood Pressure Disorder   metFORMIN 1000 MG tablet Commonly known as:  GLUCOPHAGE Take 1 tablet (1,000 mg total) by mouth 2 (two) times daily with a meal. What changed:  medication  strength  Indication:  Type 2 Diabetes   traZODone 50 MG tablet Commonly known as:  DESYREL Take 1 tablet (50 mg total) by mouth at bedtime and may repeat dose one time if needed.  Indication:  [facility]. Go on 06/15/2017.   Specialty:  Behavioral Health Why:  Please attend your medication appt on Thursday, 06/15/17, at 4:30pm. Please attend your therapy appt with Maurice Small on 07/21/17, at 11:00am. Contact information: [address] [place] [phone]          Follow-up recommendations:  Continue activity as tolerated. Continue diet as recommended by your PCP. Ensure to keep all appointments with outpatient providers.  Comments:  Patient is instructed prior to discharge to: Take all medications as prescribed by his/her mental healthcare provider. Report any adverse effects and or reactions from the medicines to his/her outpatient provider promptly. Patient has been instructed & cautioned: To not engage in alcohol and or illegal drug use while on prescription medicines. In the event of worsening symptoms, patient is instructed to call the crisis hotline, 911 and or go to the nearest ED for appropriate evaluation and treatment of symptoms. To follow-up with his/her primary care provider for your other medical issues, concerns and or health care needs.    Signed: Lowry Ram Lenoria Narine, FNP 06/08/2017, 9:06 AM

## 2017-06-08 NOTE — Progress Notes (Signed)
  [facility] Adult Case Management Discharge Plan :  Will you be returning to the same living situation after discharge:  Yes,  patient reports she is returning home with her mother At discharge, do you have transportation home?: Yes,  patient reports her father is picking her up at discharge Do you have the ability to pay for your medications: Yes,  [employer]  Release of information consent forms completed and in the chart;  Patient's signature needed at discharge.  Patient to Follow up at: Follow-up Information    [facility]. Go on 06/15/2017.   Specialty:  Behavioral Health Why:  Please attend your medication appt on Thursday, 06/15/17, at 4:30pm. Please attend your therapy appt with Peggy Bynum on 07/21/17, at 11:00am. Contact information: [address] [phone]          Next level of care provider has access to [place] and Suicide Prevention discussed: Yes,  with the patient's daughter  Have you used any form of tobacco in the last 30 days? (Cigarettes, Smokeless Tobacco, Cigars, and/or Pipes): No  Has patient been referred to the Quitline?: N/A patient is not a smoker  Patient has been referred for addiction treatment: N/A  Marylee Floras, LCSWA 06/08/2017, 10:36 AM

## 2017-06-08 NOTE — Plan of Care (Signed)
  Problem: Safety: Goal: Ability to remain free from injury will improve Outcome: Progressing Note:  Pt has not harmed self or others tonight.  She denies SI/HI and verbally contracts for safety.    

## 2017-06-13 NOTE — Progress Notes (Signed)
BH MD/PA/NP OP Progress Note  06/15/2017 5:02 PM Lindsay Pacheco  MRN:  [id number]  Chief Complaint:  Chief Complaint    Follow-up; Depression     HPI:  - She was admitted to [facility] 6/2-6/4 after overdosing clonazepam in a suicide attempt in the context of familial discordance. Duloxetine was uptitrated during the admission.   The patient presents for follow-up appointment for depression. She states that she was dealing with "family crap" before the admission. She was very overwhelmed, distraught, upset while arguing with her parents and sister. She took ten tabs of clonazepam to sleep. She denies this as a suicide attempt. She feels this act as "stupid" now and adamantly denies SI. Her mother stays at her place. She feels that her mother is "driving me insane." She ruminates on this topic for a while. Her sister offered to take care of her mother for a few weeks per month. Her sister commented that her mother's mental status has changed lately. She then talks about her father, who compares the patient with her sister, who went to college and got married to a Chief Executive Officer. Her father accuses her of using substance in the past. She feels that she is not the part of the family. She went to [facility] and will start group session next week. She is also advised to attend anger management class in summer. She feels less depressed. She sleeps well with Trazodone. She has fair appetite and concentration. She denies SI. She feels tense and anxious at times. She used weed a few months ago.   Wt Readings from Last 3 Encounters:  06/15/17 273 lb (123.8 kg)  06/05/17 275 lb (124.7 kg)  02/15/17 276 lb (125.2 kg)    Visit Diagnosis:    ICD-10-CM   1. Moderate episode of recurrent major depressive disorder (HCC) F33.1   2. Borderline personality disorder ([place]) F60.3     Past Psychiatric History: Please see initial evaluation for full details. I have reviewed the history. No updates at this time.     Past Medical  History:  Past Medical History:  Diagnosis Date  . Anxiety   . Arthritis    "knees" (11/02/2015)  . Breast cancer ([place]) 2017   left breast   . Breast cancer, left breast ([place]) 09/30/2015  . Depression   . Diabetes mellitus, type II ([place])   . Diverticulitis 1987  . Hypertension   . Irritable bowel syndrome (IBS)   . Sleep apnea    does not use CPAP but "I'm suppose to" (11/02/2015)    Past Surgical History:  Procedure Laterality Date  . BREAST BIOPSY Left 09/2015  . BREAST LUMPECTOMY Left 11/02/2015  . BREAST LUMPECTOMY WITH AXILLARY LYMPH NODE BIOPSY Left 11/02/2015   BREAST LUMPECTOMY WITH BRACKETED RADIOACTIVE SEED AND LEFT AXILLARY SENTINEL LYMPH NODE BIOPSY   . BREAST LUMPECTOMY WITH RADIOACTIVE SEED AND SENTINEL LYMPH NODE BIOPSY Left 11/02/2015   Procedure: LEFT BREAST LUMPECTOMY WITH BRACKETED RADIOACTIVE SEED AND LEFT AXILLARY SENTINEL LYMPH NODE BIOPSY;  Surgeon: [name], MD;  Location: [place];  Service: General;  Laterality: Left;  . COLONOSCOPY N/A 12/09/2015   Procedure: COLONOSCOPY;  Surgeon: [name], MD;  [facility];  Service: Endoscopy;  Laterality: N/A;  1:00  . EVACUATION BREAST HEMATOMA Left 11/02/2015  . EVACUATION BREAST HEMATOMA Left 11/02/2015   Procedure: EVACUATION HEMATOMA BREAST;  Surgeon: [name], MD;  Location: [place];  Service: General;  Laterality: Left;  . JOINT  REPLACEMENT    . KNEE ARTHROSCOPY Right 2000s  . LAPAROSCOPIC CHOLECYSTECTOMY  1995  . POLYPECTOMY  12/09/2015   Procedure: POLYPECTOMY;  Surgeon: [name], MD;  [facility];  Service: Endoscopy;;  multiple  . TOTAL KNEE ARTHROPLASTY Right 2011    Family Psychiatric History: Please see initial evaluation for full details. I have reviewed the history. No updates at this time.     Family History:  Family History  Problem Relation Age of Onset  . Anxiety disorder Father   . Depression Father    . Other Father        Abestosis  . Depression Sister   . Diabetes Mother     Social History:  Social History   Socioeconomic History  . Marital status: Divorced    Spouse name: Not on file  . Number of children: Not on file  . Years of education: Not on file  . Highest education level: Not on file  Occupational History  . Not on file  Social Needs  . Financial resource strain: Not on file  . Food insecurity:    Worry: Not on file    Inability: Not on file  . Transportation needs:    Medical: Not on file    Non-medical: Not on file  Tobacco Use  . Smoking status: Never Smoker  . Smokeless tobacco: Never Used  Substance and Sexual Activity  . Alcohol use: Not Currently  . Drug use: Yes    Types: Marijuana    Comment: 11/02/2015 "drug use in my past; now maybe 2 puffs 3-4 times/year; for social anxiety"  . Sexual activity: Not Currently  Lifestyle  . Physical activity:    Days per week: Not on file    Minutes per session: Not on file  . Stress: Not on file  Relationships  . Social connections:    Talks on phone: Not on file    Gets together: Not on file    Attends religious service: Not on file    Active member of club or organization: Not on file    Attends meetings of clubs or organizations: Not on file    Relationship status: Not on file  Other Topics Concern  . Not on file  Social History Narrative  . Not on file    Allergies:  Allergies  Allergen Reactions  . Adhesive [Tape] Rash  . Sulfa Antibiotics Nausea And Vomiting    Metabolic Disorder Labs: No results found for: HGBA1C, MPG No results found for: PROLACTIN No results found for: CHOL, TRIG, HDL, CHOLHDL, VLDL, LDLCALC No results found for: TSH  Therapeutic Level Labs: No results found for: LITHIUM Lab Results  Component Value Date   VALPROATE 48.0 (L) 07/15/2016   VALPROATE 50.7 04/18/2016   No components found for:  CBMZ  Current Medications: Current Outpatient Medications   Medication Sig Dispense Refill  . atorvastatin (LIPITOR) 10 MG tablet Take 1 tablet (10 mg total) by mouth daily. For high cholesterol 30 tablet 0  . DULoxetine (CYMBALTA) 60 MG capsule Take 1 capsule (60 mg total) by mouth 2 (two) times daily. For mood control 60 capsule 0  . gabapentin (NEURONTIN) 300 MG capsule Take 1 capsule (300 mg total) by mouth at bedtime. 30 capsule 0  . letrozole (FEMARA) 2.5 MG tablet Take 1 tablet (2.5 mg total) by mouth daily. 90 tablet 3  . lisinopril (PRINIVIL,ZESTRIL) 20 MG tablet Take 1 tablet (20 mg total) by mouth daily. For high blood  pressure 30 tablet 0  . metFORMIN (GLUCOPHAGE) 1000 MG tablet Take 1 tablet (1,000 mg total) by mouth 2 (two) times daily with a meal. 60 tablet 0  . traZODone (DESYREL) 50 MG tablet Take 1 tablet (50 mg total) by mouth at bedtime and may repeat dose one time if needed. 30 tablet 0   No current facility-administered medications for this visit.      Musculoskeletal: Strength & Muscle Tone: within normal limits Gait & Station: normal Patient leans: N/A  Psychiatric Specialty Exam: Review of Systems  Psychiatric/Behavioral: Positive for depression. Negative for hallucinations, memory loss, substance abuse and suicidal ideas. The patient is nervous/anxious and has insomnia.   All other systems reviewed and are negative.   Blood pressure 120/76, pulse (!) 102, height 5\' 7"  (1.702 m), weight 273 lb (123.8 kg), SpO2 97 %.Body mass index is 42.76 kg/m.  General Appearance: Fairly Groomed  Eye Contact:  Good  Speech:  Clear and Coherent  Volume:  Normal  Mood:  "fine"  Affect:  Appropriate, Congruent and Restricted, upset  Thought Process:  Coherent  Orientation:  Full (Time, Place, and Person)  Thought Content: Logical   Suicidal Thoughts:  No  Homicidal Thoughts:  No  Memory:  Immediate;   Good  Judgement:  Fair  Insight:  Shallow  Psychomotor Activity:  Normal  Concentration:  Concentration: Good and Attention  Span: Good  Recall:  Good  Fund of Knowledge: Good  Language: Good  Akathisia:  No  Handed:  Right  AIMS (if indicated): not done  Assets:  Communication Skills Desire for Improvement  ADL's:  Intact  Cognition: WNL  Sleep:  Good   Screenings: AIMS     Admission (Discharged) from 06/05/2017 in [place] [facility]  AIMS Total Score  0    AUDIT     Admission (Discharged) from 06/05/2017 in [place] [facility]  Alcohol Use Disorder Identification Test Final Score (AUDIT)  0    PHQ2-9     Follow Up Appointment 15 from 03/31/2016 in [place] from 10/21/2015 in [facility]  PHQ-2 Total Score  0  2  (Pended)   PHQ-9 Total Score  -  11  (Pended)        Assessment and Plan:  BRIUNNA LEICHT is a 57 y.o. year old female with a history of depression,  diabetes, hypertension, left breast cancer s/p bracketed lumpectomy, radiotherapy on letrozole since 03/03/2016 and irritable bower syndrome , who presents for follow up appointment for Moderate episode of recurrent major depressive disorder ([place])  Borderline personality disorder ([place]).  She was admitted to [facility] 6/2-6/4 after overdosing clonazepam in a suicide attempt in the context of familial discordance.  # Borderline personality disorder # MDD, moderate, recurrent without psychotic features Exam is notable for significant externalization. Her clinical course is consistent with borderline personality disorder. Will continue duloxetine to target depression. Although she may benefit from antipsychotic as adjunctive treatment for depression and for mood dysregulation, she would like to hold this and see if therapy is effective. Will continue trazodone prn for insomnia. Discussed self compassion. Discussed distress tolerance skills. She will greatly benefit from therapy; she is [facility].  Plan I have reviewed and updated plans as below 1. Continue duloxetine 60 mg twice a day  2. Continue Trazodone 50 mg at night as needed for sleep 2. Return to clinic  in one month for 30 mins 3  [facility]  Emergency resources which includes 911, ED, suicide crisis line [phone]) are discussed.   Past trials of medication: Paxil (felt numb), Effexor,Depakote(limited effect),Abilify, Klonopin  I have reviewed suicide assessment in detail. No change in the following assessment.   The patient demonstrates the following risk factors for suicide: Chronic risk factors for suicide include: psychiatric disorder of depression, substance use disorder, previous suicide attempts overdosing ativan/clonazepamand chronic pain. Acute risk factorsfor suicide include: family or marital conflict, unemployment, social withdrawal/isolation and loss (financial, interpersonal, professional), recent hospitalization. Protective factorsfor this patient include: hope for the future and is amenable to the treatment. Considering these factors,  the patient is not at imminent risk of self harm, although she is at chronically elevated risk. Patient isappropriate for outpatient follow up. She denies gun access at home.   The duration of this appointment visit was 30 minutes of face-to-face time with the patient.  Greater than 50% of this time was spent in counseling, explanation of  diagnosis, planning of further management, and coordination of care.   Norman Clay, MD 06/15/2017, 5:02 PM

## 2017-06-15 ENCOUNTER — Encounter (HOSPITAL_COMMUNITY): Payer: Self-pay | Admitting: Psychiatry

## 2017-06-15 ENCOUNTER — Ambulatory Visit (HOSPITAL_COMMUNITY): Payer: Medicare Other | Admitting: Psychiatry

## 2017-06-15 VITALS — BP 120/76 | HR 102 | Ht 67.0 in | Wt 273.0 lb

## 2017-06-15 DIAGNOSIS — F331 Major depressive disorder, recurrent, moderate: Secondary | ICD-10-CM | POA: Diagnosis not present

## 2017-06-15 DIAGNOSIS — F603 Borderline personality disorder: Secondary | ICD-10-CM | POA: Diagnosis not present

## 2017-06-15 MED ORDER — TRAZODONE HCL 50 MG PO TABS: 50.0000 mg | ORAL_TABLET | Freq: Every evening | ORAL | 0 refills | Status: DC | PRN

## 2017-06-15 MED ORDER — DULOXETINE HCL 60 MG PO CPEP: 60.0000 mg | ORAL_CAPSULE | Freq: Two times a day (BID) | ORAL | 0 refills | Status: DC

## 2017-06-15 NOTE — Patient Instructions (Addendum)
1. Continue duloxetine 60 mg twice a day 2. Continue Trazodone 50 mg at night as needed for sleep 3. Return to clinic in one month for 30 mins 4  Continue to go to [place] in [place]

## 2017-07-21 ENCOUNTER — Ambulatory Visit (HOSPITAL_COMMUNITY): Payer: Medicare Other | Admitting: Psychiatry

## 2017-07-21 ENCOUNTER — Encounter (HOSPITAL_COMMUNITY): Payer: Self-pay | Admitting: Psychiatry

## 2017-07-21 ENCOUNTER — Ambulatory Visit (HOSPITAL_COMMUNITY): Payer: Self-pay | Admitting: Psychiatry

## 2017-07-21 DIAGNOSIS — F331 Major depressive disorder, recurrent, moderate: Secondary | ICD-10-CM

## 2017-07-21 DIAGNOSIS — F603 Borderline personality disorder: Secondary | ICD-10-CM
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
## 2017-07-22 NOTE — Progress Notes (Deleted)
[place] MD/PA/NP OP Progress Note  07/22/2017 8:20 AM ROISIN MONES  MRN:  [id number]  Chief Complaint:  HPI: *** Visit Diagnosis: No diagnosis found.  Past Psychiatric History: Please see initial evaluation for full details. I have reviewed the history. No updates at this time.     Past Medical History:  Past Medical History:  Diagnosis Date  . Anxiety   . Arthritis    "knees" (11/02/2015)  . Breast cancer ([place]) 2017   left breast   . Breast cancer, left breast ([place]) 09/30/2015  . Depression   . Diabetes mellitus, type II ([place])   . Diverticulitis 1987  . Hypertension   . Irritable bowel syndrome (IBS)   . Sleep apnea    does not use CPAP but "I'm suppose to" (11/02/2015)    Past Surgical History:  Procedure Laterality Date  . BREAST BIOPSY Left 09/2015  . BREAST LUMPECTOMY Left 11/02/2015  . BREAST LUMPECTOMY WITH AXILLARY LYMPH NODE BIOPSY Left 11/02/2015   BREAST LUMPECTOMY WITH BRACKETED RADIOACTIVE SEED AND LEFT AXILLARY SENTINEL LYMPH NODE BIOPSY   . BREAST LUMPECTOMY WITH RADIOACTIVE SEED AND SENTINEL LYMPH NODE BIOPSY Left 11/02/2015   Procedure: LEFT BREAST LUMPECTOMY WITH BRACKETED RADIOACTIVE SEED AND LEFT AXILLARY SENTINEL LYMPH NODE BIOPSY;  Surgeon: [name], MD;  Location: [place];  Service: General;  Laterality: Left;  . COLONOSCOPY N/A 12/09/2015   Procedure: COLONOSCOPY;  Surgeon: [name], MD;  [facility];  Service: Endoscopy;  Laterality: N/A;  1:00  . EVACUATION BREAST HEMATOMA Left 11/02/2015  . EVACUATION BREAST HEMATOMA Left 11/02/2015   Procedure: EVACUATION HEMATOMA BREAST;  Surgeon: [name], MD;  Location: [place];  Service: General;  Laterality: Left;  . JOINT REPLACEMENT    . KNEE ARTHROSCOPY Right 2000s  . LAPAROSCOPIC CHOLECYSTECTOMY  1995  . POLYPECTOMY  12/09/2015   Procedure: POLYPECTOMY;  Surgeon: [name], MD;  [facility];  Service: Endoscopy;;   multiple  . TOTAL KNEE ARTHROPLASTY Right 2011    Family Psychiatric History: Please see initial evaluation for full details. I have reviewed the history. No updates at this time.     Family History:  Family History  Problem Relation Age of Onset  . Anxiety disorder Father   . Depression Father   . Other Father        Abestosis  . Depression Sister   . Diabetes Mother     Social History:  Social History   Socioeconomic History  . Marital status: Divorced    Spouse name: Not on file  . Number of children: Not on file  . Years of education: Not on file  . Highest education level: Not on file  Occupational History  . Not on file  Social Needs  . Financial resource strain: Not on file  . Food insecurity:    Worry: Not on file    Inability: Not on file  . Transportation needs:    Medical: Not on file    Non-medical: Not on file  Tobacco Use  . Smoking status: Never Smoker  . Smokeless tobacco: Never Used  Substance and Sexual Activity  . Alcohol use: Not Currently  . Drug use: Yes    Types: Marijuana    Comment: last used in 2018  . Sexual activity: Not Currently  Lifestyle  . Physical activity:    Days per week: Not on file    Minutes per session: Not on file  .  Stress: Not on file  Relationships  . Social connections:    Talks on phone: Not on file    Gets together: Not on file    Attends religious service: Not on file    Active member of club or organization: Not on file    Attends meetings of clubs or organizations: Not on file    Relationship status: Not on file  Other Topics Concern  . Not on file  Social History Narrative  . Not on file    Allergies:  Allergies  Allergen Reactions  . Adhesive [Tape] Rash  . Sulfa Antibiotics Nausea And Vomiting    Metabolic Disorder Labs: No results found for: HGBA1C, MPG No results found for: PROLACTIN No results found for: CHOL, TRIG, HDL, CHOLHDL, VLDL, LDLCALC No results found for: TSH  Therapeutic  Level Labs: No results found for: LITHIUM Lab Results  Component Value Date   VALPROATE 48.0 (L) 07/15/2016   VALPROATE 50.7 04/18/2016   No components found for:  CBMZ  Current Medications: Current Outpatient Medications  Medication Sig Dispense Refill  . atorvastatin (LIPITOR) 10 MG tablet Take 1 tablet (10 mg total) by mouth daily. For high cholesterol 30 tablet 0  . DULoxetine (CYMBALTA) 60 MG capsule Take 1 capsule (60 mg total) by mouth 2 (two) times daily. For mood control 60 capsule 0  . gabapentin (NEURONTIN) 300 MG capsule Take 1 capsule (300 mg total) by mouth at bedtime. 30 capsule 0  . letrozole (FEMARA) 2.5 MG tablet Take 1 tablet (2.5 mg total) by mouth daily. 90 tablet 3  . lisinopril (PRINIVIL,ZESTRIL) 20 MG tablet Take 1 tablet (20 mg total) by mouth daily. For high blood pressure 30 tablet 0  . metFORMIN (GLUCOPHAGE) 1000 MG tablet Take 1 tablet (1,000 mg total) by mouth 2 (two) times daily with a meal. 60 tablet 0  . traZODone (DESYREL) 50 MG tablet Take 1 tablet (50 mg total) by mouth at bedtime and may repeat dose one time if needed. 30 tablet 0   No current facility-administered medications for this visit.      Musculoskeletal: Strength & Muscle Tone: within normal limits Gait & Station: normal Patient leans: N/A  Psychiatric Specialty Exam: ROS  There were no vitals taken for this visit.There is no height or weight on file to calculate BMI.  General Appearance: Fairly Groomed  Eye Contact:  Good  Speech:  Clear and Coherent  Volume:  Normal  Mood:  {BHH MOOD:22306}  Affect:  {Affect (PAA):22687}  Thought Process:  Coherent  Orientation:  Full (Time, Place, and Person)  Thought Content: Logical   Suicidal Thoughts:  {ST/HT (PAA):22692}  Homicidal Thoughts:  {ST/HT (PAA):22692}  Memory:  Immediate;   Good  Judgement:  {Judgement (PAA):22694}  Insight:  {Insight (PAA):22695}  Psychomotor Activity:  Normal  Concentration:  Concentration: Good and  Attention Span: Good  Recall:  Good  Fund of Knowledge: Good  Language: Good  Akathisia:  No  Handed:  Right  AIMS (if indicated): not done  Assets:  Communication Skills Desire for Improvement  ADL's:  Intact  Cognition: WNL  Sleep:  {BHH GOOD/FAIR/POOR:22877}   Screenings: AIMS     Admission (Discharged) from 06/05/2017 in [facility]  AIMS Total Score  0    AUDIT     Admission (Discharged) from 06/05/2017 in [facility] [facility]  Alcohol Use Disorder Identification Test Final Score (AUDIT)  0    PHQ2-9  Follow Up Appointment 15 from 03/31/2016 in [facility] from 10/21/2015 in [facility]  PHQ-2 Total Score  0  2  (Pended)   PHQ-9 Total Score  -  11  (Pended)        Assessment and Plan:  KENDALLYN LIPPOLD is a 57 y.o. year old female with a history of depression, diabetes, hypertension, left breast cancer s/p bracketed lumpectomy, radiotherapy on letrozole since 03/03/2016 and irritable bower syndrome, who presents for follow up appointment for No diagnosis found. She was admitted to [facility] 6/2-6/4 after overdosing clonazepam in a suicide attempt in the context of familial discordance.  # Borderline personality disorder # MDD, moderate, recurrent without psychotic features  Exam is notable for significant externalization. Her clinical course is consistent with borderline personality disorder. Will continue duloxetine to target depression. Although she may benefit from antipsychotic as adjunctive treatment for depression and for mood dysregulation, she would like to hold this and see if therapy is effective. Will continue trazodone prn for insomnia. Discussed self compassion. Discussed distress tolerance skills. She will greatly benefit from therapy; she is encouraged to continue to be seen at [facility].  Plan 1. Continue duloxetine 60 mg twice a  day  2. Continue Trazodone 50 mg at night as needed for sleep 2. Return to clinic in one month for 30 mins 3  She goes to [place] in [facility]  Emergency resources which includes 911, ED, suicide crisis line [phone]) are discussed.   Past trials of medication: Paxil (felt numb), Effexor,Depakote(limited effect),Abilify, Klonopin  I have reviewed suicide assessment in detail. No change in the following assessment.   The patient demonstrates the following risk factors for suicide: Chronic risk factors for suicide include: psychiatric disorder of depression, substance use disorder, previous suicide attempts overdosing ativan/clonazepamand chronic pain. Acute risk factorsfor suicide include: family or marital conflict, unemployment, social withdrawal/isolation and loss (financial, interpersonal, professional), recent hospitalization. Protective factorsfor this patient include: hope for the future and is amenable to the treatment. Considering these factors,  the patient is not at imminent risk of self harm, although she is at chronically elevated risk. Patient isappropriate for outpatient follow up. She denies gun access at home.     Norman Clay, MD 07/22/2017, 8:20 AM

## 2017-07-26 ENCOUNTER — Ambulatory Visit (HOSPITAL_COMMUNITY): Payer: Self-pay | Admitting: Psychiatry

## 2017-08-22 NOTE — Progress Notes (Deleted)
[place] MD/PA/NP OP Progress Note  08/22/2017 10:29 AM Lindsay Pacheco  MRN:  [id number]  Chief Complaint:  HPI: *** Visit Diagnosis: No diagnosis found.  Past Psychiatric History: Please see initial evaluation for full details. I have reviewed the history. No updates at this time.     Past Medical History:  Past Medical History:  Diagnosis Date  . Anxiety   . Arthritis    "knees" (11/02/2015)  . Breast cancer ([place]) 2017   left breast   . Breast cancer, left breast ([place]) 09/30/2015  . Depression   . Diabetes mellitus, type II ([place])   . Diverticulitis 1987  . Hypertension   . Irritable bowel syndrome (IBS)   . Sleep apnea    does not use CPAP but "I'm suppose to" (11/02/2015)    Past Surgical History:  Procedure Laterality Date  . BREAST BIOPSY Left 09/2015  . BREAST LUMPECTOMY Left 11/02/2015  . BREAST LUMPECTOMY WITH AXILLARY LYMPH NODE BIOPSY Left 11/02/2015   BREAST LUMPECTOMY WITH BRACKETED RADIOACTIVE SEED AND LEFT AXILLARY SENTINEL LYMPH NODE BIOPSY   . BREAST LUMPECTOMY WITH RADIOACTIVE SEED AND SENTINEL LYMPH NODE BIOPSY Left 11/02/2015   Procedure: LEFT BREAST LUMPECTOMY WITH BRACKETED RADIOACTIVE SEED AND LEFT AXILLARY SENTINEL LYMPH NODE BIOPSY;  Surgeon: [name], MD;  Location: [place];  Service: General;  Laterality: Left;  . COLONOSCOPY N/A 12/09/2015   Procedure: COLONOSCOPY;  Surgeon: [name], MD;  [facility];  Service: Endoscopy;  Laterality: N/A;  1:00  . EVACUATION BREAST HEMATOMA Left 11/02/2015  . EVACUATION BREAST HEMATOMA Left 11/02/2015   Procedure: EVACUATION HEMATOMA BREAST;  Surgeon: [name], MD;  Location: [place];  Service: General;  Laterality: Left;  . JOINT REPLACEMENT    . KNEE ARTHROSCOPY Right 2000s  . LAPAROSCOPIC CHOLECYSTECTOMY  1995  . POLYPECTOMY  12/09/2015   Procedure: POLYPECTOMY;  Surgeon: [name], MD;  [facility];  Service: Endoscopy;;   multiple  . TOTAL KNEE ARTHROPLASTY Right 2011    Family Psychiatric History: Please see initial evaluation for full details. I have reviewed the history. No updates at this time.     Family History:  Family History  Problem Relation Age of Onset  . Anxiety disorder Father   . Depression Father   . Other Father        Abestosis  . Depression Sister   . Diabetes Mother     Social History:  Social History   Socioeconomic History  . Marital status: Divorced    Spouse name: Not on file  . Number of children: Not on file  . Years of education: Not on file  . Highest education level: Not on file  Occupational History  . Not on file  Social Needs  . Financial resource strain: Not on file  . Food insecurity:    Worry: Not on file    Inability: Not on file  . Transportation needs:    Medical: Not on file    Non-medical: Not on file  Tobacco Use  . Smoking status: Never Smoker  . Smokeless tobacco: Never Used  Substance and Sexual Activity  . Alcohol use: Not Currently  . Drug use: Yes    Types: Marijuana    Comment: last used in 2018  . Sexual activity: Not Currently  Lifestyle  . Physical activity:    Days per week: Not on file    Minutes per session: Not on file  .  Stress: Not on file  Relationships  . Social connections:    Talks on phone: Not on file    Gets together: Not on file    Attends religious service: Not on file    Active member of club or organization: Not on file    Attends meetings of clubs or organizations: Not on file    Relationship status: Not on file  Other Topics Concern  . Not on file  Social History Narrative  . Not on file    Allergies:  Allergies  Allergen Reactions  . Adhesive [Tape] Rash  . Sulfa Antibiotics Nausea And Vomiting    Metabolic Disorder Labs: No results found for: HGBA1C, MPG No results found for: PROLACTIN No results found for: CHOL, TRIG, HDL, CHOLHDL, VLDL, LDLCALC No results found for: TSH  Therapeutic  Level Labs: No results found for: LITHIUM Lab Results  Component Value Date   VALPROATE 48.0 (L) 07/15/2016   VALPROATE 50.7 04/18/2016   No components found for:  CBMZ  Current Medications: Current Outpatient Medications  Medication Sig Dispense Refill  . atorvastatin (LIPITOR) 10 MG tablet Take 1 tablet (10 mg total) by mouth daily. For high cholesterol 30 tablet 0  . DULoxetine (CYMBALTA) 60 MG capsule Take 1 capsule (60 mg total) by mouth 2 (two) times daily. For mood control 60 capsule 0  . gabapentin (NEURONTIN) 300 MG capsule Take 1 capsule (300 mg total) by mouth at bedtime. 30 capsule 0  . letrozole (FEMARA) 2.5 MG tablet Take 1 tablet (2.5 mg total) by mouth daily. 90 tablet 3  . lisinopril (PRINIVIL,ZESTRIL) 20 MG tablet Take 1 tablet (20 mg total) by mouth daily. For high blood pressure 30 tablet 0  . metFORMIN (GLUCOPHAGE) 1000 MG tablet Take 1 tablet (1,000 mg total) by mouth 2 (two) times daily with a meal. 60 tablet 0  . traZODone (DESYREL) 50 MG tablet Take 1 tablet (50 mg total) by mouth at bedtime and may repeat dose one time if needed. 30 tablet 0   No current facility-administered medications for this visit.      Musculoskeletal: Strength & Muscle Tone: within normal limits Gait & Station: normal Patient leans: N/A  Psychiatric Specialty Exam: ROS  There were no vitals taken for this visit.There is no height or weight on file to calculate BMI.  General Appearance: Fairly Groomed  Eye Contact:  Good  Speech:  Clear and Coherent  Volume:  Normal  Mood:  {BHH MOOD:22306}  Affect:  {Affect (PAA):22687}  Thought Process:  Coherent  Orientation:  Full (Time, Place, and Person)  Thought Content: Logical   Suicidal Thoughts:  {ST/HT (PAA):22692}  Homicidal Thoughts:  {ST/HT (PAA):22692}  Memory:  Immediate;   Good  Judgement:  {Judgement (PAA):22694}  Insight:  {Insight (PAA):22695}  Psychomotor Activity:  Normal  Concentration:  Concentration: Good and  Attention Span: Good  Recall:  Good  Fund of Knowledge: Good  Language: Good  Akathisia:  No  Handed:  Right  AIMS (if indicated): not done  Assets:  Communication Skills Desire for Improvement  ADL's:  Intact  Cognition: WNL  Sleep:  {BHH GOOD/FAIR/POOR:22877}   Screenings: AIMS     Admission (Discharged) from 06/05/2017 in [place] [facility]  AIMS Total Score  0    AUDIT     Admission (Discharged) from 06/05/2017 in [place] [facility]  Alcohol Use Disorder Identification Test Final Score (AUDIT)  0    PHQ2-9  Follow Up Appointment 15 from 03/31/2016 in [place] from 10/21/2015 in [place] [facility]  PHQ-2 Total Score  0  2  (Pended)   PHQ-9 Total Score  -  11  (Pended)        Assessment and Plan:  Lindsay Pacheco is a 57 y.o. year old female with a history of depression, borderline personality disorder, hypertension,  left breast cancer s/p bracketed lumpectomy, radiotherapy on letrozole since 03/03/2016 and irritable bower syndrome , who presents for follow up appointment for No diagnosis found.  She was admitted to [facility] 6/2-6/4 after overdosing clonazepam in a suicide attempt in the context of familial discordance.  # Borderline personality disorder # MDD, moderate, recurrent without psychotic features  Exam is notable for significant externalization. Her clinical course is consistent with borderline personality disorder. Will continue duloxetine to target depression. Although she may benefit from antipsychotic as adjunctive treatment for depression and for mood dysregulation, she would like to hold this and see if therapy is effective. Will continue trazodone prn for insomnia. Discussed self compassion. Discussed distress tolerance skills. She will greatly benefit from therapy; she is encouraged to continue to be seen at [facility].  Plan  1.  Continue duloxetine 60 mg twice a day  2. Continue Trazodone 50 mg at night as needed for sleep 2. Return to clinic in one month for 30 mins 3  She goes to [place] in [facility]  Emergency resources which includes 911, ED, suicide crisis line [phone]) are discussed.   Past trials of medication: Paxil (felt numb), Effexor,Depakote(limited effect),Abilify, Klonopin  I have reviewed suicide assessment in detail. No change in the following assessment.   The patient demonstrates the following risk factors for suicide: Chronic risk factors for suicide include: psychiatric disorder of depression, substance use disorder, previous suicide attempts overdosing ativan/clonazepamand chronic pain. Acute risk factorsfor suicide include: family or marital conflict, unemployment, social withdrawal/isolation and loss (financial, interpersonal, professional), recent hospitalization. Protective factorsfor this patient include: hope for the future and is amenable to the treatment. Considering these factors,  the patient is not at imminent risk of self harm, although she is at chronically elevated risk. Patient isappropriate for outpatient follow up. She denies gun access at home.   Norman Clay, MD 08/22/2017, 10:29 AM

## 2017-08-25 ENCOUNTER — Ambulatory Visit (HOSPITAL_COMMUNITY): Payer: Medicare Other | Admitting: Psychiatry

## 2017-09-01 ENCOUNTER — Ambulatory Visit (HOSPITAL_COMMUNITY): Payer: Medicare Other | Admitting: Psychiatry

## 2017-09-01 ENCOUNTER — Encounter

## 2017-09-11 ENCOUNTER — Other Ambulatory Visit (HOSPITAL_COMMUNITY): Payer: Self-pay | Admitting: Psychiatry

## 2017-09-11 MED ORDER — DULOXETINE HCL 60 MG PO CPEP: 60.0000 mg | ORAL_CAPSULE | Freq: Two times a day (BID) | ORAL | 0 refills | Status: DC

## 2017-09-11 MED ORDER — TRAZODONE HCL 50 MG PO TABS: 50.0000 mg | ORAL_TABLET | Freq: Every evening | ORAL | 0 refills | Status: DC | PRN

## 2017-09-12 NOTE — Progress Notes (Deleted)
BH MD/PA/NP OP Progress Note  09/12/2017 2:45 PM Lindsay Pacheco  MRN:  [id number]  Chief Complaint:  HPI: *** Visit Diagnosis: No diagnosis found.  Past Psychiatric History: Please see initial evaluation for full details. I have reviewed the history. No updates at this time.     Past Medical History:  Past Medical History:  Diagnosis Date  . Anxiety   . Arthritis    "knees" (11/02/2015)  . Breast cancer ([place]) 2017   left breast   . Breast cancer, left breast ([place]) 09/30/2015  . Depression   . Diabetes mellitus, type II ([place])   . Diverticulitis 1987  . Hypertension   . Irritable bowel syndrome (IBS)   . Sleep apnea    does not use CPAP but "I'm suppose to" (11/02/2015)    Past Surgical History:  Procedure Laterality Date  . BREAST BIOPSY Left 09/2015  . BREAST LUMPECTOMY Left 11/02/2015  . BREAST LUMPECTOMY WITH AXILLARY LYMPH NODE BIOPSY Left 11/02/2015   BREAST LUMPECTOMY WITH BRACKETED RADIOACTIVE SEED AND LEFT AXILLARY SENTINEL LYMPH NODE BIOPSY   . BREAST LUMPECTOMY WITH RADIOACTIVE SEED AND SENTINEL LYMPH NODE BIOPSY Left 11/02/2015   Procedure: LEFT BREAST LUMPECTOMY WITH BRACKETED RADIOACTIVE SEED AND LEFT AXILLARY SENTINEL LYMPH NODE BIOPSY;  Surgeon: [name], MD;  Location: [facility];  Service: General;  Laterality: Left;  . COLONOSCOPY N/A 12/09/2015   Procedure: COLONOSCOPY;  Surgeon: [name], MD;  [facility];  Service: Endoscopy;  Laterality: N/A;  1:00  . EVACUATION BREAST HEMATOMA Left 11/02/2015  . EVACUATION BREAST HEMATOMA Left 11/02/2015   Procedure: EVACUATION HEMATOMA BREAST;  Surgeon: [name], MD;  Location: [facility];  Service: General;  Laterality: Left;  . JOINT REPLACEMENT    . KNEE ARTHROSCOPY Right 2000s  . LAPAROSCOPIC CHOLECYSTECTOMY  1995  . POLYPECTOMY  12/09/2015   Procedure: POLYPECTOMY;  Surgeon: [name], MD;  [facility];  Service: Endoscopy;;   multiple  . TOTAL KNEE ARTHROPLASTY Right 2011    Family Psychiatric History: Please see initial evaluation for full details. I have reviewed the history. No updates at this time.     Family History:  Family History  Problem Relation Age of Onset  . Anxiety disorder Father   . Depression Father   . Other Father        Abestosis  . Depression Sister   . Diabetes Mother     Social History:  Social History   Socioeconomic History  . Marital status: Divorced    Spouse name: Not on file  . Number of children: Not on file  . Years of education: Not on file  . Highest education level: Not on file  Occupational History  . Not on file  Social Needs  . Financial resource strain: Not on file  . Food insecurity:    Worry: Not on file    Inability: Not on file  . Transportation needs:    Medical: Not on file    Non-medical: Not on file  Tobacco Use  . Smoking status: Never Smoker  . Smokeless tobacco: Never Used  Substance and Sexual Activity  . Alcohol use: Not Currently  . Drug use: Yes    Types: Marijuana    Comment: last used in 2018  . Sexual activity: Not Currently  Lifestyle  . Physical activity:    Days per week: Not on file    Minutes per session: Not on file  .  Stress: Not on file  Relationships  . Social connections:    Talks on phone: Not on file    Gets together: Not on file    Attends religious service: Not on file    Active member of club or organization: Not on file    Attends meetings of clubs or organizations: Not on file    Relationship status: Not on file  Other Topics Concern  . Not on file  Social History Narrative  . Not on file    Allergies:  Allergies  Allergen Reactions  . Adhesive [Tape] Rash  . Sulfa Antibiotics Nausea And Vomiting    Metabolic Disorder Labs: No results found for: HGBA1C, MPG No results found for: PROLACTIN No results found for: CHOL, TRIG, HDL, CHOLHDL, VLDL, LDLCALC No results found for: TSH  Therapeutic  Level Labs: No results found for: LITHIUM Lab Results  Component Value Date   VALPROATE 48.0 (L) 07/15/2016   VALPROATE 50.7 04/18/2016   No components found for:  CBMZ  Current Medications: Current Outpatient Medications  Medication Sig Dispense Refill  . atorvastatin (LIPITOR) 10 MG tablet Take 1 tablet (10 mg total) by mouth daily. For high cholesterol 30 tablet 0  . DULoxetine (CYMBALTA) 60 MG capsule Take 1 capsule (60 mg total) by mouth 2 (two) times daily. For mood control 60 capsule 0  . gabapentin (NEURONTIN) 300 MG capsule Take 1 capsule (300 mg total) by mouth at bedtime. 30 capsule 0  . letrozole (FEMARA) 2.5 MG tablet Take 1 tablet (2.5 mg total) by mouth daily. 90 tablet 3  . lisinopril (PRINIVIL,ZESTRIL) 20 MG tablet Take 1 tablet (20 mg total) by mouth daily. For high blood pressure 30 tablet 0  . metFORMIN (GLUCOPHAGE) 1000 MG tablet Take 1 tablet (1,000 mg total) by mouth 2 (two) times daily with a meal. 60 tablet 0  . traZODone (DESYREL) 50 MG tablet Take 1 tablet (50 mg total) by mouth at bedtime and may repeat dose one time if needed. 30 tablet 0   No current facility-administered medications for this visit.      Musculoskeletal: Strength & Muscle Tone: within normal limits Gait & Station: normal Patient leans: N/A  Psychiatric Specialty Exam: ROS  There were no vitals taken for this visit.There is no height or weight on file to calculate BMI.  General Appearance: Fairly Groomed  Eye Contact:  Good  Speech:  Clear and Coherent  Volume:  Normal  Mood:  {BHH MOOD:22306}  Affect:  {Affect (PAA):22687}  Thought Process:  Coherent  Orientation:  Full (Time, Place, and Person)  Thought Content: Logical   Suicidal Thoughts:  {ST/HT (PAA):22692}  Homicidal Thoughts:  {ST/HT (PAA):22692}  Memory:  Immediate;   Good  Judgement:  {Judgement (PAA):22694}  Insight:  {Insight (PAA):22695}  Psychomotor Activity:  Normal  Concentration:  Concentration: Good and  Attention Span: Good  Recall:  Good  Fund of Knowledge: Good  Language: Good  Akathisia:  No  Handed:  Right  AIMS (if indicated): not done  Assets:  Communication Skills Desire for Improvement  ADL's:  Intact  Cognition: WNL  Sleep:  {BHH GOOD/FAIR/POOR:22877}   Screenings: AIMS     Admission (Discharged) from 06/05/2017 in [facility]  AIMS Total Score  0    AUDIT     Admission (Discharged) from 06/05/2017 in [facility]  Alcohol Use Disorder Identification Test Final Score (AUDIT)  0    PHQ2-9  Follow Up Appointment 15 from 03/31/2016 in [facility] from 10/21/2015 in [facility]  PHQ-2 Total Score  0  2  (Pended)   PHQ-9 Total Score  -  11  (Pended)        Assessment and Plan:  TERRENCE PIZANA is a 57 y.o. year old female with a history of depression, borderline personality disorder,diabetes, hypertension, left breast cancer s/p bracketed lumpectomy, radiotherapy on letrozole since 03/03/2016 and irritable bower syndrome , who presents for follow up appointment for No diagnosis found.  She was admitted to [facility] 6/2-6/4 after overdosing clonazepam in a suicide attempt in the context of familial discordance.  # borderline personality disorder # MDD, moderate, recurrent without psychotic features  Exam is notable for significant externalization. Her clinical course is consistent with borderline personality disorder. Will continue duloxetine to target depression. Although she may benefit from antipsychotic as adjunctive treatment for depression and for mood dysregulation, she would like to hold this and see if therapy is effective. Will continue trazodone prn for insomnia. Discussed self compassion. Discussed distress tolerance skills. She will greatly benefit from therapy; she is encouraged to continue to be seen at  [facility].  Plan  1. Continue duloxetine 60 mg twice a day  2. Continue Trazodone 50 mg at night as needed for sleep 2. Return to clinic in one month for 30 mins 3  She goes to [facility] in [facility]  Emergency resources which includes 911, ED, suicide crisis line [phone]) are discussed.   Past trials of medication: Paxil (felt numb), Effexor,Depakote(limited effect),Abilify, Klonopin  I have reviewed suicide assessment in detail. No change in the following assessment.   The patient demonstrates the following risk factors for suicide: Chronic risk factors for suicide include: psychiatric disorder of depression, substance use disorder, previous suicide attempts overdosing ativan/clonazepamand chronic pain. Acute risk factorsfor suicide include: family or marital conflict, unemployment, social withdrawal/isolation and loss (financial, interpersonal, professional), recent hospitalization. Protective factorsfor this patient include: hope for the future and is amenable to the treatment. Considering these factors,  the patient is not at imminent risk of self harm, although she is at chronically elevated risk. Patient isappropriate for outpatient follow up. She denies gun access at home.   Norman Clay, MD 09/12/2017, 2:45 PM

## 2017-09-13 ENCOUNTER — Other Ambulatory Visit: Payer: Self-pay | Admitting: Oncology

## 2017-09-13 DIAGNOSIS — Z9889 Other specified postprocedural states: Secondary | ICD-10-CM

## 2017-09-20 ENCOUNTER — Ambulatory Visit (HOSPITAL_COMMUNITY): Payer: Self-pay | Admitting: Psychiatry

## 2017-09-21 ENCOUNTER — Ambulatory Visit (HOSPITAL_COMMUNITY): Payer: Self-pay | Admitting: Psychiatry

## 2017-09-25 ENCOUNTER — Other Ambulatory Visit: Payer: Self-pay | Admitting: Oncology

## 2017-10-02 NOTE — Progress Notes (Signed)
[facility] MD/PA/NP OP Progress Note  10/09/2017 8:36 AM Lindsay Pacheco  MRN:  [id number]  Chief Complaint:  Chief Complaint    Follow-up; Other; Depression     HPI:  Patient presents for follow-up appointment for depression.  She states that she has started to see Peggy for therapy.  Although she still has family issues,  she has been trying to "cope with it."  She talks about an episode about discordance with her sister.  She was accused of being mad by her sister, when she shared her feelings of being upset when she was told something hurtful.  She now lives by herself, and her mother lives in the neighborhood.  She feels much better today by herself.  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  She states that it has been "therapeutic" to talk with her customers. She denies any issues at work.  She has occasional insomnia.  She feels depressed and has fatigue at times.  She has fair concentration.  She denies SI.  She feels anxious and tense when she is along with her family.  She denies panic attacks.  She denies HI. Discussed no show policy.   Wt Readings from Last 3 Encounters:  10/09/17 267 lb (121.1 kg)  06/15/17 273 lb (123.8 kg)  06/05/17 275 lb (124.7 kg)    Visit Diagnosis:    ICD-10-CM   1. Borderline personality disorder ([place]) F60.3   2. Moderate episode of recurrent major depressive disorder ([place]) F33.1     Past Psychiatric History: Please see initial evaluation for full details. I have reviewed the history. No updates at this time.     Past Medical History:  Past Medical History:  Diagnosis Date  . Anxiety   . Arthritis    "knees" (11/02/2015)  . Breast cancer ([place]) 2017   left breast   . Breast cancer, left breast ([place]) 09/30/2015  . Depression   . Diabetes mellitus, type II ([place])   . Diverticulitis 1987  . Hypertension   . Irritable bowel syndrome (IBS)   . Sleep apnea    does not use CPAP but "I'm suppose to"  (11/02/2015)    Past Surgical History:  Procedure Laterality Date  . BREAST BIOPSY Left 09/2015  . BREAST LUMPECTOMY Left 11/02/2015  . BREAST LUMPECTOMY WITH AXILLARY LYMPH NODE BIOPSY Left 11/02/2015   BREAST LUMPECTOMY WITH BRACKETED RADIOACTIVE SEED AND LEFT AXILLARY SENTINEL LYMPH NODE BIOPSY   . BREAST LUMPECTOMY WITH RADIOACTIVE SEED AND SENTINEL LYMPH NODE BIOPSY Left 11/02/2015   Procedure: LEFT BREAST LUMPECTOMY WITH BRACKETED RADIOACTIVE SEED AND LEFT AXILLARY SENTINEL LYMPH NODE BIOPSY;  Surgeon: [name], MD;  Location: [facility];  Service: General;  Laterality: Left;  . COLONOSCOPY N/A 12/09/2015   Procedure: COLONOSCOPY;  Surgeon: [name], MD;  [facility];  Service: Endoscopy;  Laterality: N/A;  1:00  . EVACUATION BREAST HEMATOMA Left 11/02/2015  . EVACUATION BREAST HEMATOMA Left 11/02/2015   Procedure: EVACUATION HEMATOMA BREAST;  Surgeon: [name], MD;  Location: [facility];  Service: General;  Laterality: Left;  . JOINT REPLACEMENT    . KNEE ARTHROSCOPY Right 2000s  . LAPAROSCOPIC CHOLECYSTECTOMY  1995  . POLYPECTOMY  12/09/2015   Procedure: POLYPECTOMY;  Surgeon: [name], MD;  [facility];  Service: Endoscopy;;  multiple  . TOTAL KNEE ARTHROPLASTY Right 2011    Family Psychiatric History: Please see  initial evaluation for full details. I have reviewed the history. No updates at this time.     Family History:  Family History  Problem Relation Age of Onset  . Anxiety disorder Father   . Depression Father   . Other Father        Abestosis  . Depression Sister   . Diabetes Mother     Social History:  Social History   Socioeconomic History  . Marital status: Divorced    Spouse name: Not on file  . Number of children: Not on file  . Years of education: Not on file  . Highest education level: Not on file  Occupational History  . Not on file  Social Needs  . Financial  resource strain: Not on file  . Food insecurity:    Worry: Not on file    Inability: Not on file  . Transportation needs:    Medical: Not on file    Non-medical: Not on file  Tobacco Use  . Smoking status: Never Smoker  . Smokeless tobacco: Never Used  Substance and Sexual Activity  . Alcohol use: Not Currently  . Drug use: Yes    Types: Marijuana    Comment: last used in 2018  . Sexual activity: Not Currently  Lifestyle  . Physical activity:    Days per week: Not on file    Minutes per session: Not on file  . Stress: Not on file  Relationships  . Social connections:    Talks on phone: Not on file    Gets together: Not on file    Attends religious service: Not on file    Active member of club or organization: Not on file    Attends meetings of clubs or organizations: Not on file    Relationship status: Not on file  Other Topics Concern  . Not on file  Social History Narrative  . Not on file    Allergies:  Allergies  Allergen Reactions  . Adhesive [Tape] Rash  . Sulfa Antibiotics Nausea And Vomiting    Metabolic Disorder Labs: No results found for: HGBA1C, MPG No results found for: PROLACTIN No results found for: CHOL, TRIG, HDL, CHOLHDL, VLDL, LDLCALC No results found for: TSH  Therapeutic Level Labs: No results found for: LITHIUM Lab Results  Component Value Date   VALPROATE 48.0 (L) 07/15/2016   VALPROATE 50.7 04/18/2016   No components found for:  CBMZ  Current Medications: Current Outpatient Medications  Medication Sig Dispense Refill  . atorvastatin (LIPITOR) 10 MG tablet Take 1 tablet (10 mg total) by mouth daily. For high cholesterol 30 tablet 0  . DULoxetine (CYMBALTA) 60 MG capsule Take 1 capsule (60 mg total) by mouth 2 (two) times daily. For mood control 60 capsule 0  . gabapentin (NEURONTIN) 300 MG capsule TAKE ONE CAPSULE BY MOUTH AT BEDTIME. 90 capsule 1  . letrozole (FEMARA) 2.5 MG tablet Take 1 tablet (2.5 mg total) by mouth daily. 90  tablet 3  . lisinopril (PRINIVIL,ZESTRIL) 20 MG tablet Take 1 tablet (20 mg total) by mouth daily. For high blood pressure 30 tablet 0  . metFORMIN (GLUCOPHAGE) 1000 MG tablet Take 1 tablet (1,000 mg total) by mouth 2 (two) times daily with a meal. 60 tablet 0  . traZODone (DESYREL) 50 MG tablet Take 1 tablet (50 mg total) by mouth at bedtime as needed for sleep. 90 tablet 0   No current facility-administered medications for this visit.      Musculoskeletal: Strength &  Muscle Tone: within normal limits Gait & Station: normal Patient leans: N/A  Psychiatric Specialty Exam: Review of Systems  Psychiatric/Behavioral: Positive for depression. Negative for hallucinations, memory loss, substance abuse and suicidal ideas. The patient is nervous/anxious and has insomnia.   All other systems reviewed and are negative.   Blood pressure 113/79, pulse (!) 104, height 5\' 7"  (1.702 m), weight 267 lb (121.1 kg), SpO2 95 %.Body mass index is 41.82 kg/m.  General Appearance: Fairly Groomed  Eye Contact:  Good  Speech:  Clear and Coherent  Volume:  Normal  Mood:  "fine"  Affect:  Appropriate, Congruent and euthymic  Thought Process:  Coherent  Orientation:  Full (Time, Place, and Person)  Thought Content: Logical   Suicidal Thoughts:  No  Homicidal Thoughts:  No  Memory:  Immediate;   Good  Judgement:  Good  Insight:  Fair  Psychomotor Activity:  Normal  Concentration:  Concentration: Good and Attention Span: Good  Recall:  Good  Fund of Knowledge: Good  Language: Good  Akathisia:  No  Handed:  Right  AIMS (if indicated): not done  Assets:  Communication Skills Desire for Improvement  ADL's:  Intact  Cognition: WNL  Sleep:  Fair   Screenings: AIMS     Admission (Discharged) from 06/05/2017 in [facility]  AIMS Total Score  0    AUDIT     Admission (Discharged) from 06/05/2017 in [facility]  Alcohol Use Disorder  Identification Test Final Score (AUDIT)  0    PHQ2-9     Follow Up Appointment 15 from 03/31/2016 in [facility] from 10/21/2015 in [facility] [facility]  PHQ-2 Total Score  0  2  (Pended)   PHQ-9 Total Score  -  11  (Pended)        Assessment and Plan:  PAYTYN MESTA is a 57 y.o. year old female with a history of depression, borderline personality disorder, diabetes, hypertension, left breast cancer s/p bracketed lumpectomy, radiotherapy on letrozole since 03/03/2016 and irritable bower syndrome , who presents for follow up appointment for Borderline personality disorder ([place])  Moderate episode of recurrent major depressive disorder ([place])  She was admitted to [facility] 6/2-6/4 after overdosing clonazepam in a suicide attempt in the context of familial discordance.  # Borderline personality disorder # MDD, moderate, recurrent without psychotic features Exam is notable for significantly calmer affect and she is redirected more easily, while she occasionally ruminates on frustration with her family. Patient denies significant mood episode since the last appointment, which coincided with starting to see a therapist.  Psychosocial stressors including family discordance.  Will continue duloxetine to target depression.  We will continue trazodone as needed for insomnia.  Discussed distress tolerance skills.  Discussed cognitive diffusion.  She is encouraged to continue to see her therapist.   Plan I have reviewed and updated plans as below 1. Continue duloxetine 60 mg twice a day  2. Continue Trazodone 50 mg at night as needed for sleep 2. Return to clinic in three months for 30 mins 3  She goes to [facility] in [place]  - on gabapentin 300 mg at night for hot flushes  Past trials of medication: Paxil (felt numb), Effexor,Depakote(limited effect),Abilify, Klonopin  The patient demonstrates the  following risk factors for suicide: Chronic risk factors for suicide include: psychiatric disorder of depression, substance use disorder, previous suicide attempts overdosing ativan/clonazepamand chronic pain. Acute  risk factorsfor suicide include: family or marital conflict, unemployment, social withdrawal/isolation and loss (financial, interpersonal, professional), recent hospitalization. Protective factorsfor this patient include: hope for the future and is amenable to the treatment. Considering these factors,  the patient is not at imminent risk of self harm, although she is at chronically elevated risk. Patient isappropriate for outpatient follow up. She denies gun access at home.   The duration of this appointment visit was 30 minutes of face-to-face time with the patient.  Greater than 50% of this time was spent in counseling, explanation of  diagnosis, planning of further management, and coordination of care.  Norman Clay, MD 10/09/2017, 8:36 AM

## 2017-10-04 ENCOUNTER — Ambulatory Visit (HOSPITAL_COMMUNITY): Payer: Medicare Other | Admitting: Psychiatry

## 2017-10-04 DIAGNOSIS — F331 Major depressive disorder, recurrent, moderate: Secondary | ICD-10-CM

## 2017-10-04 NOTE — Progress Notes (Signed)
   THERAPIST PROGRESS NOTE  Session Time: Wednesday 10/04/2017 11:20 AM  -12:05 PM  Participation Level: Active  Behavioral Response: CasualAlertDepressed  Type of Therapy: Individual Therapy  Treatment Goals addressed: Establish rapport, learn and implement effective coping skills to manage stress, anxiety, and depression   Interventions: CBT  Summary: Lindsay Pacheco is a 57 y.o. female who presents with a long standing history of symptoms of depression and anxiety. She is a returning patient to this clinician as she was seen briefly in 2017. Patient had a suicide attempt by taking 10 klonopin and was hospitalized at [facility] in June 2019. patient reports the attempt was triggered by conflict with family and states she was tired of feeling like she was left out. She also states her mother was stress in her at the time as they were living together but mother and now resides with sister. Mother has dementia. She also reports financial stress. Patient states daughter stresses her out sometimes because daughter can become hateful when she becomes anxious. Patient reports symptoms have decreased since discharge from hospital. However, she continues to experience anxiety, periods of depressed mood, and crying spells.  Patient last was seen in July 2019. She reports continued stress, periods of depressed mood, and anxiety. She is particularly stressed by the relationship with her family. She reports brother recently violated his parole and expresses worry he may return to prison. She also expresses frustration as her mother and other family members take his side and does not see that he does anything wrong per patient's report. She reports still feeling left out of the family. She reports continued frustration and sadness about her relationship with her daughter. She reports continued conflict as daughter often brings up negative memories about the way patient treated daughter. Patient reports being becoming  upset and having explosive emotional outbursts with daughter including yelling and name-calling.    Suicidal/Homicidal: Nowithout intent/plan  Therapist Response: reviewed symptoms, administered PHQ-9, discussed stressors, facilitated expression of thoughts and feelings, oriented patient to CBT, discussed treatment target and began to identify objectives ( improve relationship with daughter and other family members/ improve emotion regulation skills) and goals (identify clear values and goals regarding relationship with family members, learn and implement ways to improve assertiveness skills, enhance emotional awareness and discrimination of emotions, used cognitive reappraisal)  Plan: Return again in 2 weeks.  Diagnosis: Axis I: MDD, Recurrent, Moderate        Axis II: Borderline Personality Dis.    [place], [place] 10/04/2017

## 2017-10-09 ENCOUNTER — Encounter (HOSPITAL_COMMUNITY): Payer: Self-pay | Admitting: Psychiatry

## 2017-10-09 ENCOUNTER — Ambulatory Visit (HOSPITAL_COMMUNITY): Payer: Medicare Other | Admitting: Psychiatry

## 2017-10-09 VITALS — BP 113/79 | HR 104 | Ht 67.0 in | Wt 267.0 lb

## 2017-10-09 DIAGNOSIS — F331 Major depressive disorder, recurrent, moderate: Secondary | ICD-10-CM

## 2017-10-09 DIAGNOSIS — G4700 Insomnia, unspecified: Secondary | ICD-10-CM

## 2017-10-09 DIAGNOSIS — F419 Anxiety disorder, unspecified: Secondary | ICD-10-CM | POA: Diagnosis not present

## 2017-10-09 DIAGNOSIS — F603 Borderline personality disorder: Secondary | ICD-10-CM | POA: Diagnosis not present

## 2017-10-09 MED ORDER — TRAZODONE HCL 50 MG PO TABS: 50.0000 mg | ORAL_TABLET | Freq: Every evening | ORAL | 0 refills | Status: DC | PRN

## 2017-10-09 MED ORDER — DULOXETINE HCL 60 MG PO CPEP: 60.0000 mg | ORAL_CAPSULE | Freq: Two times a day (BID) | ORAL | 0 refills | Status: DC

## 2017-10-09 NOTE — Patient Instructions (Signed)
1. Continue duloxetine 60 mg twice a day  2. Continue Trazodone 50 mg at night as needed for sleep 2. Return to clinic in three months for 30 mins

## 2017-10-16 ENCOUNTER — Ambulatory Visit
Admission: RE | Admit: 2017-10-16 | Discharge: 2017-10-16 | Disposition: A | Discharge status: Home / Self Care | Payer: Medicare Other | Source: Ambulatory Visit | Attending: Oncology | Admitting: Oncology

## 2017-10-16 DIAGNOSIS — Z9889 Other specified postprocedural states: Secondary | ICD-10-CM

## 2017-10-18 ENCOUNTER — Ambulatory Visit (HOSPITAL_COMMUNITY): Payer: Medicare Other | Admitting: Psychiatry

## 2017-10-18 DIAGNOSIS — F331 Major depressive disorder, recurrent, moderate: Secondary | ICD-10-CM

## 2017-10-18 DIAGNOSIS — F603 Borderline personality disorder: Secondary | ICD-10-CM | POA: Diagnosis not present

## 2017-10-18 NOTE — Progress Notes (Signed)
   THERAPIST PROGRESS NOTE  Session Time: Wednesday 10/18/2017  11:15 AM  - 11:59 AM  Participation Level: Active  Behavioral Response: CasualAlertDepressed  Type of Therapy: Individual Therapy  Treatment Goals addressed: Establish rapport, learn and implement effective coping skills to manage stress, anxiety, and depression   Interventions: CBT  Summary: [name] is a 57 y.o. female who presents with a long standing history of symptoms of depression and anxiety. She is a returning patient to this clinician as she was seen briefly in 2017. Patient had a suicide attempt by taking 10 klonopin and was hospitalized at [facility] in June 2019. patient reports the attempt was triggered by conflict with family and states she was tired of feeling like she was left out. She also states her mother was stress in her at the time as they were living together but mother and now resides with sister. Mother has dementia. She also reports financial stress. Patient states daughter stresses her out sometimes because daughter can become hateful when she becomes anxious. Patient reports symptoms have decreased since discharge from hospital. However, she continues to experience anxiety, periods of depressed mood, and crying spells.  Patient reports things were going pretty good until this past Sunday. She states losing it with daughter after daughter became upset, yelled, and blamed patient for trying to fix her computer. Patient states yelling and name-calling. She reports feeling guilty for her past treatment of daughter and thoughts of having to fix things for daughter so she will be happy. Patient also reports recently having an emotional outburst with her father.   Suicidal/Homicidal: Nowithout intent/plan  Therapist Response: reviewed symptoms,  discussed stressors, facilitated expression of thoughts and feelings, validated feelings, help patient examine her behavioral patterns in relationship with daughter  using ABC behavioral analysis, discussed increasing emotional awareness and improving emotional regulation to avoid yelling/name calling at daughter, assisted patient identify triggers and early signs of anger, assisted patient develop plan to disengage at early signs of anger, use grounding techniques (relaxation breathing, mindfulness activity using walking), assigned patient to continue practicing deep breating 5-10 minutes 2 x per day Plan: Return again in 2 weeks.  Diagnosis: Axis I: MDD, Recurrent, Moderate        Axis II: Borderline Personality Dis.    [place], [place] 10/18/2017

## 2017-11-01 ENCOUNTER — Ambulatory Visit (HOSPITAL_COMMUNITY): Payer: Self-pay | Admitting: Psychiatry

## 2017-11-02 ENCOUNTER — Telehealth: Payer: Self-pay | Admitting: Oncology

## 2017-11-02 NOTE — Telephone Encounter (Signed)
Patient called to schedule, she said that she had called 3 times

## 2017-11-15 ENCOUNTER — Encounter (HOSPITAL_COMMUNITY): Payer: Self-pay | Admitting: Psychiatry

## 2017-11-15 ENCOUNTER — Ambulatory Visit (HOSPITAL_COMMUNITY): Payer: Medicare Other | Admitting: Psychiatry

## 2017-11-15 DIAGNOSIS — F331 Major depressive disorder, recurrent, moderate: Secondary | ICD-10-CM | POA: Diagnosis not present

## 2017-11-15 DIAGNOSIS — F603 Borderline personality disorder: Secondary | ICD-10-CM | POA: Diagnosis not present

## 2017-11-15 NOTE — Progress Notes (Signed)
   THERAPIST PROGRESS NOTE  Session Time: Wednesday 11/15/2017 11:10 AM - 11:57 AM         Participation Level: Active  Behavioral Response: CasualAlert/less depressed,   Type of Therapy: Individual Therapy  Treatment Goals addressed: learn and implement effective coping skills to manage stress, anxiety, and depression   Interventions: CBT  Summary: Lindsay Pacheco is a 57 y.o. female who presents with a long standing history of symptoms of depression and anxiety. She is a returning patient to this clinician as she was seen briefly in 2017. Patient had a suicide attempt by taking 10 klonopin and was hospitalized at [facility] in June 2019. patient reports the attempt was triggered by conflict with family and states she was tired of feeling like she was left out. She also states her mother was stress in her at the time as they were living together but mother and now resides with sister. Mother has dementia. She also reports financial stress. Patient states daughter stresses her out sometimes because daughter can become hateful when she becomes anxious. Patient reports symptoms have decreased since discharge from hospital. However, she continues to experience anxiety, periods of depressed mood, and crying spells.  Patient reports improved interaction with daughter. She reports recent incident with daughter during a phone call but reports using grounding techniques to avoid negative interaction with daughter. She then reports talking to daughter in calm manner while also being assertive. She reports having no anger or emotional outburst with daughter since last session. She reports recent incident with father's girlfriend. Patient reports becoming very angry about incident as well as experiencing anger about previous incidents over the past 30 years. She then sent girlfriend an angry text. Patient expresses anger and sadness as she states father and her brother leave her out of everything.    Suicidal/Homicidal: Nowithout intent/plan  Therapist Response: reviewed symptoms,  discussed stressors, facilitated expression of thoughts and feelings, validated feelings, praised and reinforced patient's use of grounding techniques to manage intense emotions during interaction with daughter, discussed effects on interaction and patient's mood, helped patient examine her behavioral patterns in recent incidents with father and stepmother, assisted patient identify alternative ways she could have managed situation, began to discuss dx of BPD, agreed to assess and discuss more at next session, began to explore options for treatment, assigned patient to continue using grounding techniques and practicing deep breathing  Plan: Return again in 2 weeks.  Diagnosis: Axis I: MDD, Recurrent, Moderate        Axis II: Borderline Personality Dis.    [place], [place] 11/15/2017

## 2017-11-20 ENCOUNTER — Inpatient Hospital Stay: Payer: Medicare Other | Attending: Adult Health

## 2017-11-20 ENCOUNTER — Inpatient Hospital Stay (HOSPITAL_BASED_OUTPATIENT_CLINIC_OR_DEPARTMENT_OTHER): Payer: Medicare Other | Admitting: Adult Health

## 2017-11-20 ENCOUNTER — Encounter: Payer: Self-pay | Admitting: Adult Health

## 2017-11-20 ENCOUNTER — Telehealth: Payer: Self-pay | Admitting: Oncology

## 2017-11-20 VITALS — BP 138/97 | HR 102 | Temp 97.7°F | Resp 19 | Ht 67.0 in | Wt 272.7 lb

## 2017-11-20 DIAGNOSIS — Z170 Estrogen receptor positive status [ER+]: Secondary | ICD-10-CM

## 2017-11-20 DIAGNOSIS — C50212 Malignant neoplasm of upper-inner quadrant of left female breast: Secondary | ICD-10-CM

## 2017-11-20 DIAGNOSIS — Z923 Personal history of irradiation: Secondary | ICD-10-CM | POA: Insufficient documentation

## 2017-11-20 DIAGNOSIS — Z79811 Long term (current) use of aromatase inhibitors: Secondary | ICD-10-CM | POA: Diagnosis not present

## 2017-11-20 DIAGNOSIS — N951 Menopausal and female climacteric states: Secondary | ICD-10-CM | POA: Insufficient documentation

## 2017-11-20 DIAGNOSIS — C50812 Malignant neoplasm of overlapping sites of left female breast: Secondary | ICD-10-CM | POA: Insufficient documentation

## 2017-11-20 DIAGNOSIS — E2839 Other primary ovarian failure: Secondary | ICD-10-CM

## 2017-11-20 LAB — COMPREHENSIVE METABOLIC PANEL
ALT: 14 U/L (ref 0–44)
AST: 13 U/L — ABNORMAL LOW (ref 15–41)
Albumin: 3.5 g/dL (ref 3.5–5.0)
Alkaline Phosphatase: 127 U/L — ABNORMAL HIGH (ref 38–126)
Anion gap: 10 (ref 5–15)
BILIRUBIN TOTAL: 0.5 mg/dL (ref 0.3–1.2)
BUN: 12 mg/dL (ref 6–20)
CALCIUM: 9.5 mg/dL (ref 8.9–10.3)
CO2: 27 mmol/L (ref 22–32)
Chloride: 105 mmol/L (ref 98–111)
Creatinine, Ser: 0.86 mg/dL (ref 0.44–1.00)
GLUCOSE: 246 mg/dL — AB (ref 70–99)
POTASSIUM: 4.1 mmol/L (ref 3.5–5.1)
Sodium: 142 mmol/L (ref 135–145)
TOTAL PROTEIN: 6.9 g/dL (ref 6.5–8.1)

## 2017-11-20 LAB — CBC WITH DIFFERENTIAL/PLATELET
Abs Immature Granulocytes: 0.02 10*3/uL (ref 0.00–0.07)
Basophils Absolute: 0.1 10*3/uL (ref 0.0–0.1)
Basophils Relative: 2 %
EOS ABS: 0.6 10*3/uL — AB (ref 0.0–0.5)
EOS PCT: 8 %
HEMATOCRIT: 41.3 % (ref 36.0–46.0)
Hemoglobin: 12.8 g/dL (ref 12.0–15.0)
Immature Granulocytes: 0 %
LYMPHS ABS: 1.9 10*3/uL (ref 0.7–4.0)
Lymphocytes Relative: 25 %
MCH: 26.9 pg (ref 26.0–34.0)
MCHC: 31 g/dL (ref 30.0–36.0)
MCV: 86.8 fL (ref 80.0–100.0)
Monocytes Absolute: 0.5 10*3/uL (ref 0.1–1.0)
Monocytes Relative: 6 %
Neutro Abs: 4.4 10*3/uL (ref 1.7–7.7)
Neutrophils Relative %: 59 %
Platelets: 263 10*3/uL (ref 150–400)
RBC: 4.76 MIL/uL (ref 3.87–5.11)
RDW: 13.6 % (ref 11.5–15.5)
WBC: 7.6 10*3/uL (ref 4.0–10.5)
nRBC: 0 % (ref 0.0–0.2)

## 2017-11-20 NOTE — Progress Notes (Signed)
[facility]  Telephone:([phone]) [phone] Fax:(336) [phone]     ID: Lindsay Pacheco DOB: Jul 14, 1960  MR#: [id number]  NOB#:[id number]  Patient Care Team: Monico Blitz, MD as PCP - General (Internal Medicine) Magrinat, Virgie Dad, MD as Consulting Physician (Oncology) Kyung Rudd, MD as Consulting Physician (Radiation Oncology) [name], MD as Consulting Physician (General Surgery) [name], MD as Consulting Physician (Gastroenterology) Norman Clay, MD as Consulting Physician (Psychiatry) Gaynelle Arabian, MD as Consulting Physician (Orthopedic Surgery) OTHER MD:  CHIEF COMPLAINT: Estrogen receptor positive breast cancer  CURRENT TREATMENT: Letrozole   BREAST CANCER HISTORY: From the original intake note:  The patient had routine bilateral screening mammography 09/10/2015 showing apparent new right breast calcifications and an area of distortion and possible mass in the left breast. On 09/23/2015 she underwent bilateral diagnostic mammography and left breast ultrasonography. The breast density was category C. The right breast calcification were felt to be benign. However in the left breast there was a persistent area of distortion in the superior subareolar areolar area, with 2 or 3 small masses in the upper inner quadrant. On exam there was an area of firm tissue just above the nipple of the left breast. Ultrasonography confirmed an area of shadowing at the 12:00 radiant 1 cm from the nipple and in addition 3 adjacent hypoechoic masses in the upper inner quadrant of the left breast, the largest measuring 1.1 cm and altogether measuring 2.5 cm. Ultrasound of the left axilla was benign  On 09/30/2015 the patient underwent cyst aspiration and also ultrasound-guided biopsy of 2 areas in the left breast, described as 11:00 and 12:00. The 12:00 area showed invasive ductal carcinoma, grade 1. The 11:00 biopsy showed ductal carcinoma in situ. The invasive tumor was estrogen  receptor positive at greater than 90%, progesterone receptor positive at greater than 90%, both with strong staining intensity. HER-2 was equivocal by immunohistochemistry but negative by FISH with a signals ratio of 1.23 and the number per cell 2.0.  On 10/12/2015 the patient had a breast MRI showing no findings on the right, but on the left at the 12:00 region there was a spiculated enhancing mass measuring 2.2 cm. In the upper inner quadrant of the left breast there was an area of enhancement measuring 1.1 cm. In the lateral aspect of the breast was a benign-appearing intramammary lymph node and there were no abnormal appearing lymph nodes in either axilla.  With this information the patient proceeded to left seed bracketed lumpectomy and sentinel lymph node sampling 11/02/2015. The final pathology ([id number]) confirmed invasive ductal carcinoma, grade 1, measuring 2.2 cm, with all 3 sentinel lymph nodes clear. Margins were clear. The repeat prognostic panel again showed estrogen receptor 100% positivity, progesterone receptor 100% positivity, both with strong staining intensity, and no HER-2 amplification, the signals ratio being 1.06 and the number per cell 1.90.  Oncotype DX score of 18 predicted a 10 year risk of outside the breast recurrence of 12% if the patient's only systemic treatment was tamoxifen for 5 years. It also predicted no significant benefit from chemotherapy The patient then proceeded to adjuvant radiation completed 02/11/2016. She was started on letrozole 03/03/2016 by our survivorship nurse practitioner  Her subsequent history is as detailed below  INTERVAL HISTORY: Lindsay Pacheco reports today with her daughter Lindsay Pacheco, for a follow up of her estrogen positive bresat cancer.  She is taking Letrozole daily.  Recently, she was started on Gabapentin for hot flashes.  Since her last visit, she underwent bilateral  diagnostic mammogram on 10/16/2017 that showed breast density category c,  and no evidence of malignancy with repeat suggested in one year.    Lindsay Pacheco forgot about her genetic testing appointment last month.  She would like this rescheduled.    REVIEW OF SYSTEMS: Lindsay Pacheco has several other health issues such as needing a CPAP, knee replacement, and sciatica.  She is taking Letrozole daily.  She is taking Gabapentin at night for hot flashes.  She takes this at night and thinks this helps.  She is not exercising due to her knee.  She is only able to swim and ride a recumbent bike.  She notes a [employer] came to [place] and she may consider joining after the New Years crowd decreases.  Lindsay Pacheco denies any unusual headaches or vision changes.  She is without any nausea, vomiting, indigestion, bowel/bladder changes.  She isn't having any cough, shortness of breath, chest pain or palpitations.  A detailed ROS was otherwise non contributory today.     PAST MEDICAL HISTORY: Past Medical History:  Diagnosis Date  . Anxiety   . Arthritis    "knees" (11/02/2015)  . Breast cancer ([place]) 2017   left breast   . Breast cancer, left breast ([place]) 09/30/2015  . Depression   . Diabetes mellitus, type II ([place])   . Diverticulitis 1987  . Hypertension   . Irritable bowel syndrome (IBS)   . Sleep apnea    does not use CPAP but "I'm suppose to" (11/02/2015)    PAST SURGICAL HISTORY: Past Surgical History:  Procedure Laterality Date  . BREAST BIOPSY Left 09/2015  . BREAST LUMPECTOMY Left 11/02/2015  . BREAST LUMPECTOMY WITH AXILLARY LYMPH NODE BIOPSY Left 11/02/2015   BREAST LUMPECTOMY WITH BRACKETED RADIOACTIVE SEED AND LEFT AXILLARY SENTINEL LYMPH NODE BIOPSY   . BREAST LUMPECTOMY WITH RADIOACTIVE SEED AND SENTINEL LYMPH NODE BIOPSY Left 11/02/2015   Procedure: LEFT BREAST LUMPECTOMY WITH BRACKETED RADIOACTIVE SEED AND LEFT AXILLARY SENTINEL LYMPH NODE BIOPSY;  Surgeon: [name], MD;  Location: [facility];  Service: General;  Laterality: Left;  .  COLONOSCOPY N/A 12/09/2015   Procedure: COLONOSCOPY;  Surgeon: [name], MD;  [facility];  Service: Endoscopy;  Laterality: N/A;  1:00  . EVACUATION BREAST HEMATOMA Left 11/02/2015  . EVACUATION BREAST HEMATOMA Left 11/02/2015   Procedure: EVACUATION HEMATOMA BREAST;  Surgeon: [name], MD;  Location: [facility];  Service: General;  Laterality: Left;  . JOINT REPLACEMENT    . KNEE ARTHROSCOPY Right 2000s  . LAPAROSCOPIC CHOLECYSTECTOMY  1995  . POLYPECTOMY  12/09/2015   Procedure: POLYPECTOMY;  Surgeon: [name], MD;  [facility];  Service: Endoscopy;;  multiple  . TOTAL KNEE ARTHROPLASTY Right 2011    FAMILY HISTORY Family History  Problem Relation Age of Onset  . Anxiety disorder Father   . Depression Father   . Other Father        Abestosis  . Depression Sister   . Diabetes Mother   The patient's parents are still living, in their mid to late 54s. The patient has one brother and one sister. On the paternal side one uncle had spinal cord cancer and both paternal grandparents had lung cancer in the setting of tobacco abuse. The patient has one paternal aunt diagnosed with breast cancer in her 39s  GYNECOLOGIC HISTORY:  No LMP recorded. Patient is postmenopausal. Menarche age 70, first live birth age 13, she is [place] P1. She went through  menopause in her early 110s. She never took hormone replacement.  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 She is divorced. At home her mother has recently moved in with her. Daughter Benjie Karvonen half graduated from [employer] and is working as Architectural technologist for [employer] but lives locally. The patient is a Tourist information centre manager.   ADVANCED DIRECTIVES: Patient has the paperwork but has not yet completed it   HEALTH MAINTENANCE: Social History   Tobacco Use  . Smoking status: Never Smoker  . Smokeless  tobacco: Never Used  Substance Use Topics  . Alcohol use: Not Currently  . Drug use: Yes    Types: Marijuana    Comment: last used in 2018     Colonoscopy: 12/09/2015/[facility]  PAP:  Bone density: DEC 2017/ [facility]   Allergies  Allergen Reactions  . Adhesive [Tape] Rash  . Sulfa Antibiotics Nausea And Vomiting    Current Outpatient Medications  Medication Sig Dispense Refill  . aspirin 81 MG tablet Take 81 mg by mouth daily.    Marland Kitchen atorvastatin (LIPITOR) 10 MG tablet Take 1 tablet (10 mg total) by mouth daily. For high cholesterol 30 tablet 0  . DULoxetine (CYMBALTA) 60 MG capsule Take 1 capsule (60 mg total) by mouth 2 (two) times daily. For mood control 60 capsule 0  . gabapentin (NEURONTIN) 300 MG capsule TAKE ONE CAPSULE BY MOUTH AT BEDTIME. 90 capsule 1  . letrozole (FEMARA) 2.5 MG tablet Take 1 tablet (2.5 mg total) by mouth daily. 90 tablet 3  . lisinopril (PRINIVIL,ZESTRIL) 20 MG tablet Take 1 tablet (20 mg total) by mouth daily. For high blood pressure 30 tablet 0  . metFORMIN (GLUCOPHAGE) 1000 MG tablet Take 1 tablet (1,000 mg total) by mouth 2 (two) times daily with a meal. 60 tablet 0  . traZODone (DESYREL) 50 MG tablet Take 1 tablet (50 mg total) by mouth at bedtime as needed for sleep. 90 tablet 0   No current facility-administered medications for this visit.     OBJECTIVE:   Vitals:   11/20/17 1015  BP: (!) 138/97  Pulse: (!) 102  Resp: 19  Temp: 97.7 F (36.5 C)  SpO2: 99%     Body mass index is 42.71 kg/m.    ECOG FS:1 - Symptomatic but completely ambulatory GENERAL: Patient is a well appearing female in no acute distress HEENT:  Sclerae anicteric.  Oropharynx clear and moist. No ulcerations or evidence of oropharyngeal candidiasis. Neck is supple.  NODES:  No cervical, supraclavicular, or axillary lymphadenopathy palpated.  BREAST EXAM:  Left breast s/p lumpectomy, no sign of local recurrence, right breast benign.   LUNGS:  Clear to auscultation bilaterally.   No wheezes or rhonchi. HEART:  Regular rate and rhythm. No murmur appreciated. ABDOMEN:  Soft, nontender.  Positive, normoactive bowel sounds. No organomegaly palpated. MSK:  No focal spinal tenderness to palpation. Full range of motion bilaterally in the upper extremities. EXTREMITIES:  No peripheral edema.   SKIN:  Clear with no obvious rashes or skin changes. No nail dyscrasia. NEURO:  Nonfocal. Well oriented.  Appropriate affect.    LAB RESULTS:  CMP     Component Value Date/Time   NA 142 11/20/2017 0940   NA 140 10/13/2016 1222   K 4.1 11/20/2017 0940   K 4.0 10/13/2016 1222   CL 105 11/20/2017 0940   CO2 27 11/20/2017 0940   CO2 27 10/13/2016 1222  GLUCOSE 246 (H) 11/20/2017 0940   GLUCOSE 234 (H) 10/13/2016 1222   BUN 12 11/20/2017 0940   BUN 12.5 10/13/2016 1222   CREATININE 0.86 11/20/2017 0940   CREATININE 0.9 10/13/2016 1222   CALCIUM 9.5 11/20/2017 0940   CALCIUM 10.0 10/13/2016 1222   PROT 6.9 11/20/2017 0940   PROT 7.5 10/13/2016 1222   ALBUMIN 3.5 11/20/2017 0940   ALBUMIN 3.7 10/13/2016 1222   AST 13 (L) 11/20/2017 0940   AST 17 10/13/2016 1222   ALT 14 11/20/2017 0940   ALT 15 10/13/2016 1222   ALKPHOS 127 (H) 11/20/2017 0940   ALKPHOS 139 10/13/2016 1222   BILITOT 0.5 11/20/2017 0940   BILITOT 0.67 10/13/2016 1222   GFRNONAA >60 11/20/2017 0940   GFRAA >60 11/20/2017 0940    No results found for: TOTALPROTELP, ALBUMINELP, A1GS, A2GS, BETS, BETA2SER, GAMS, MSPIKE, SPEI  No results found for: Nils Pyle, [facility]  Lab Results  Component Value Date   WBC 7.6 11/20/2017   NEUTROABS 4.4 11/20/2017   HGB 12.8 11/20/2017   HCT 41.3 11/20/2017   MCV 86.8 11/20/2017   PLT 263 11/20/2017      Chemistry      Component Value Date/Time   NA 142 11/20/2017 0940   NA 140 10/13/2016 1222   K 4.1 11/20/2017 0940   K 4.0 10/13/2016 1222   CL 105 11/20/2017 0940   CO2 27 11/20/2017 0940   CO2 27 10/13/2016 1222   BUN 12 11/20/2017  0940   BUN 12.5 10/13/2016 1222   CREATININE 0.86 11/20/2017 0940   CREATININE 0.9 10/13/2016 1222      Component Value Date/Time   CALCIUM 9.5 11/20/2017 0940   CALCIUM 10.0 10/13/2016 1222   ALKPHOS 127 (H) 11/20/2017 0940   ALKPHOS 139 10/13/2016 1222   AST 13 (L) 11/20/2017 0940   AST 17 10/13/2016 1222   ALT 14 11/20/2017 0940   ALT 15 10/13/2016 1222   BILITOT 0.5 11/20/2017 0940   BILITOT 0.67 10/13/2016 1222       No results found for: LABCA2  No components found for: IONGEX528  No results for input(s): INR in the last 168 hours.  Urinalysis    Component Value Date/Time   COLORURINE YELLOW 01/20/2010 1045   APPEARANCEUR CLEAR 01/20/2010 1045   LABSPEC 1.016 01/20/2010 1045   PHURINE 6.0 01/20/2010 1045   GLUCOSEU NEGATIVE 10/06/2009 1040   HGBUR NEGATIVE 01/20/2010 [place] 01/20/2010 [place] 01/20/2010 1045   PROTEINUR NEGATIVE 01/20/2010 1045   UROBILINOGEN 0.2 01/20/2010 1045   NITRITE NEGATIVE 01/20/2010 1045   LEUKOCYTESUR SMALL (A) 01/20/2010 1045     STUDIES:   ELIGIBLE FOR AVAILABLE RESEARCH PROTOCOL: no   ASSESSMENT: 57 y.o. Lindsay Pacheco, Lindsay Pacheco woman status post left breast biopsy 2 (overlapping sites) 09/30/2015 for invasive ductal carcinoma, grade 1, estrogen receptor and progesterone receptor strongly positive, HER-2 nonamplified.  (1) status post left lumpectomy 11/02/2015 for a pT2 pN0, stage IIA (2017 staging) invasive ductal carcinoma, again estrogen and progesterone receptor positive, HER-2 not amplified, with negative margins  (a) restaged as IB in the 2018 prognostic classification     (2) Oncotype score of 18 predicts a 10 year risk of recurrence outside the breast of 12% if the patient's only systemic therapy is tamoxifen for 5 years. It also predicts no significant benefit from chemotherapy  (3) Adjuvant radiation 12/22/15 - 02/11/16 Site/dose:    1) Left breast: 50.4 Gy in 28 fractions 2)  Left breast  boost: 10 Gy in 5 fractions  (4) letrozole started 03/03/2016  (a) DEXA scan 12/07/2015 at [facility] showed a T score of -0.8  PLAN: Lindsay Pacheco is doing well today.  She has no clinical or radiographic sign of recurrence.  She will continue taking Letrozole daily.  I am happy that the gabapentin is improving her hot flashes at night.  She will also continue this.    Parisha's last bone density was normal.  We will repeat another one two years after the start of her Letrozole, which will be done in 03/2018 at [facility].  I ordered this today.  Rekita will be due for bilateral breast mammogram in 10/2018; orders placed today.  We reviewed bone health, healthy diet, exercise in detail.  I recommended she continue her regular f/u with her PCP for her general health care maintenance.  I also requested her genetic counseling appointment be rescheduled.  Mirielle will return in 1 year for labs and f/u with Dr. Jana Hakim. She knows to call for any problems that may develop before that visit.  A total of (30) minutes of face-to-face time was spent with this patient with greater than 50% of that time in counseling and care-coordination.   Wilber Bihari, NP  11/20/17 10:36 AM Medical Oncology and Hematology [facility] [address] [place], [place] [address] Tel. [phone]    Fax. [phone]

## 2017-11-20 NOTE — Telephone Encounter (Signed)
Gave patinet avs and calendar.   °

## 2017-11-20 NOTE — Patient Instructions (Signed)
Bone Health Bones protect organs, store calcium, and anchor muscles. Good health habits, such as eating nutritious foods and exercising regularly, are important for maintaining healthy bones. They can also help to prevent a condition that causes bones to lose density and become weak and brittle (osteoporosis). Why is bone mass important? Bone mass refers to the amount of bone tissue that you have. The higher your bone mass, the stronger your bones. An important step toward having healthy bones throughout life is to have strong and dense bones during childhood. A young adult who has a high bone mass is more likely to have a high bone mass later in life. Bone mass at its greatest it is called peak bone mass. A large decline in bone mass occurs in older adults. In women, it occurs about the time of menopause. During this time, it is important to practice good health habits, because if more bone is lost than what is replaced, the bones will become less healthy and more likely to break (fracture). If you find that you have a low bone mass, you may be able to prevent osteoporosis or further bone loss by changing your diet and lifestyle. How can I find out if my bone mass is low? Bone mass can be measured with an X-ray test that is called a bone mineral density (BMD) test. This test is recommended for all women who are age 65 or older. It may also be recommended for men who are age 70 or older, or for people who are more likely to develop osteoporosis due to:  Having bones that break easily.  Having a long-term disease that weakens bones, such as kidney disease or rheumatoid arthritis.  Having menopause earlier than normal.  Taking medicine that weakens bones, such as steroids, thyroid hormones, or hormone treatment for breast cancer or prostate cancer.  Smoking.  Drinking three or more alcoholic drinks each day.  What are the nutritional recommendations for healthy bones? To have healthy bones, you  need to get enough of the right minerals and vitamins. Most nutrition experts recommend getting these nutrients from the foods that you eat. Nutritional recommendations vary from person to person. Ask your health care provider what is healthy for you. Here are some general guidelines. Calcium Recommendations Calcium is the most important (essential) mineral for bone health. Most people can get enough calcium from their diet, but supplements may be recommended for people who are at risk for osteoporosis. Good sources of calcium include:  Dairy products, such as low-fat or nonfat milk, cheese, and yogurt.  Dark green leafy vegetables, such as bok choy and broccoli.  Calcium-fortified foods, such as orange juice, cereal, bread, soy beverages, and tofu products.  Nuts, such as almonds.  Follow these recommended amounts for daily calcium intake:  Children, age 1?3: 700 mg.  Children, age 4?8: 1,000 mg.  Children, age 9?13: 1,300 mg.  Teens, age 14?18: 1,300 mg.  Adults, age 19?50: 1,000 mg.  Adults, age 51?70: ? Men: 1,000 mg. ? Women: 1,200 mg.  Adults, age 71 or older: 1,200 mg.  Pregnant and breastfeeding females: ? Teens: 1,300 mg. ? Adults: 1,000 mg.  Vitamin D Recommendations Vitamin D is the most essential vitamin for bone health. It helps the body to absorb calcium. Sunlight stimulates the skin to make vitamin D, so be sure to get enough sunlight. If you live in a cold climate or you do not get outside often, your health care provider may recommend that you take vitamin   D supplements. Good sources of vitamin D in your diet include:  Egg yolks.  Saltwater fish.  Milk and cereal fortified with vitamin D.  Follow these recommended amounts for daily vitamin D intake:  Children and teens, age 1?18: 600 international units.  Adults, age 50 or younger: 400-800 international units.  Adults, age 51 or older: 800-1,000 international units.  Other Nutrients Other nutrients  for bone health include:  Phosphorus. This mineral is found in meat, poultry, dairy foods, nuts, and legumes. The recommended daily intake for adult men and adult women is 700 mg.  Magnesium. This mineral is found in seeds, nuts, dark green vegetables, and legumes. The recommended daily intake for adult men is 400?420 mg. For adult women, it is 310?320 mg.  Vitamin K. This vitamin is found in green leafy vegetables. The recommended daily intake is 120 mg for adult men and 90 mg for adult women.  What type of physical activity is best for building and maintaining healthy bones? Weight-bearing and strength-building activities are important for building and maintaining peak bone mass. Weight-bearing activities cause muscles and bones to work against gravity. Strength-building activities increases muscle strength that supports bones. Weight-bearing and muscle-building activities include:  Walking and hiking.  Jogging and running.  Dancing.  Gym exercises.  Lifting weights.  Tennis and racquetball.  Climbing stairs.  Aerobics.  Adults should get at least 30 minutes of moderate physical activity on most days. Children should get at least 60 minutes of moderate physical activity on most days. Ask your health care provide what type of exercise is best for you. Where can I find more information? For more information, check out the following websites:  National Osteoporosis Foundation: http://nof.org/learn/basics  National Institutes of Health: http://www.niams.nih.gov/Health_Info/Bone/Bone_Health/bone_health_for_life.asp  This information is not intended to replace advice given to you by your health care provider. Make sure you discuss any questions you have with your health care provider. Document Released: 03/12/2003 Document Revised: 07/10/2015 Document Reviewed: 12/25/2013 Elsevier Interactive Patient Education  2018 Elsevier Inc.  

## 2017-11-29 ENCOUNTER — Ambulatory Visit (HOSPITAL_COMMUNITY): Payer: Self-pay | Admitting: Psychiatry

## 2017-12-11 ENCOUNTER — Other Ambulatory Visit: Payer: Self-pay

## 2017-12-11 ENCOUNTER — Encounter: Payer: Self-pay | Admitting: Genetic Counselor

## 2017-12-11 ENCOUNTER — Ambulatory Visit: Payer: Self-pay | Admitting: Oncology

## 2018-01-01 ENCOUNTER — Other Ambulatory Visit: Payer: Self-pay | Admitting: Oncology

## 2018-01-11 ENCOUNTER — Encounter (HOSPITAL_COMMUNITY): Payer: Self-pay | Admitting: Psychiatry

## 2018-01-11 ENCOUNTER — Ambulatory Visit (INDEPENDENT_AMBULATORY_CARE_PROVIDER_SITE_OTHER): Payer: Medicare Other | Admitting: Psychiatry

## 2018-01-11 DIAGNOSIS — F331 Major depressive disorder, recurrent, moderate: Secondary | ICD-10-CM | POA: Diagnosis not present

## 2018-01-11 NOTE — Progress Notes (Signed)
   THERAPIST PROGRESS NOTE  Session Time: Thursday 01/11/2018 11:15 AM -  12:00 PM      Participation Level: Active  Behavioral Response: CasualAlert/less depressed,   Type of Therapy: Individual Therapy          Treatment Goals addressed: learn and implement effective coping skills to manage stress, anxiety, and depression   Interventions: CBT  Summary: Lindsay Pacheco is a 58 y.o. female who presents with a long standing history of symptoms of depression and anxiety. She is a returning patient to this clinician as she was seen briefly in 2017. Patient had a suicide attempt by taking 10 klonopin and was hospitalized at [facility] in June 2019. patient reports the attempt was triggered by conflict with family and states she was tired of feeling like she was left out. She also states her mother was stress in her at the time as they were living together but mother and now resides with sister. Mother has dementia. She also reports financial stress. Patient states daughter stresses her out sometimes because daughter can become hateful when she becomes anxious. Patient reports symptoms have decreased since discharge from hospital. However, she continues to experience anxiety, periods of depressed mood, and crying spells.  Patient reports increased stress, depressed mood, crying spells, and dreading Christmas due to situation with her family. She reports continuing to feel rejected by family and states having no loyalty from family. She states father didn't have contact with her for two months due to her conflict with his girlfriend. She did see father and siblings on Christmas but reports no contact with him since then. She reports continued issues in the relationship with daughter and states losing it with her daughter. Per her report, she asked daughter to leave her home after they had disagreement regarding patient's comment about daughter's involvement with a man patient disapproves of. She expresses fear  daughter will make similar mistakes in relationship that patient has made in the past. When daughter would not leave, patient reports becoming angry. Patient continues to expresses anger and sadness as she continues to state father and her brother leave her out of everything.   Suicidal/Homicidal: Nowithout intent/plan  Therapist Response: reviewed symptoms,  discussed stressors, facilitated expression of thoughts and feelings, validated feelings, discussed diagnosis of BPD, provided psychoeducation, assisted patient identify symptoms patient experiences and how these have affected patient's life, began to explore options for treatment including DBT Plan: Return again in 2 weeks.  Diagnosis: Axis I: MDD, Recurrent, Moderate        Axis II: Borderline Personality Dis.    [place], [place] 01/11/2018

## 2018-01-24 ENCOUNTER — Other Ambulatory Visit (HOSPITAL_COMMUNITY): Payer: Self-pay | Admitting: Psychiatry

## 2018-01-24 MED ORDER — DULOXETINE HCL 60 MG PO CPEP: 60.0000 mg | ORAL_CAPSULE | Freq: Two times a day (BID) | ORAL | 0 refills | Status: DC

## 2018-01-26 NOTE — Progress Notes (Deleted)
[place] MD/PA/NP OP Progress Note  01/26/2018 10:51 AM Lindsay Pacheco  MRN:  [id number]  Chief Complaint:  HPI: *** Visit Diagnosis: No diagnosis found.  Past Psychiatric History: Please see initial evaluation for full details. I have reviewed the history. No updates at this time.     Past Medical History:  Past Medical History:  Diagnosis Date  . Anxiety   . Arthritis    "knees" (11/02/2015)  . Breast cancer ([place]) 2017   left breast   . Breast cancer, left breast ([place]) 09/30/2015  . Depression   . Diabetes mellitus, type II ([place])   . Diverticulitis 1987  . Hypertension   . Irritable bowel syndrome (IBS)   . Sleep apnea    does not use CPAP but "I'm suppose to" (11/02/2015)    Past Surgical History:  Procedure Laterality Date  . BREAST BIOPSY Left 09/2015  . BREAST LUMPECTOMY Left 11/02/2015  . BREAST LUMPECTOMY WITH AXILLARY LYMPH NODE BIOPSY Left 11/02/2015   BREAST LUMPECTOMY WITH BRACKETED RADIOACTIVE SEED AND LEFT AXILLARY SENTINEL LYMPH NODE BIOPSY   . BREAST LUMPECTOMY WITH RADIOACTIVE SEED AND SENTINEL LYMPH NODE BIOPSY Left 11/02/2015   Procedure: LEFT BREAST LUMPECTOMY WITH BRACKETED RADIOACTIVE SEED AND LEFT AXILLARY SENTINEL LYMPH NODE BIOPSY;  Surgeon: [name], MD;  Location: [facility];  Service: General;  Laterality: Left;  . COLONOSCOPY N/A 12/09/2015   Procedure: COLONOSCOPY;  Surgeon: [name], MD;  [facility];  Service: Endoscopy;  Laterality: N/A;  1:00  . EVACUATION BREAST HEMATOMA Left 11/02/2015  . EVACUATION BREAST HEMATOMA Left 11/02/2015   Procedure: EVACUATION HEMATOMA BREAST;  Surgeon: [name], MD;  Location: [place];  Service: General;  Laterality: Left;  . JOINT REPLACEMENT    . KNEE ARTHROSCOPY Right 2000s  . LAPAROSCOPIC CHOLECYSTECTOMY  1995  . POLYPECTOMY  12/09/2015   Procedure: POLYPECTOMY;  Surgeon: [name], MD;  [facility];  Service: Endoscopy;;   multiple  . TOTAL KNEE ARTHROPLASTY Right 2011    Family Psychiatric History: Please see initial evaluation for full details. I have reviewed the history. No updates at this time.     Family History:  Family History  Problem Relation Age of Onset  . Anxiety disorder Father   . Depression Father   . Other Father        Abestosis  . Depression Sister   . Diabetes Mother     Social History:  Social History   Socioeconomic History  . Marital status: Divorced    Spouse name: Not on file  . Number of children: Not on file  . Years of education: Not on file  . Highest education level: Not on file  Occupational History  . Not on file  Social Needs  . Financial resource strain: Not on file  . Food insecurity:    Worry: Not on file    Inability: Not on file  . Transportation needs:    Medical: Not on file    Non-medical: Not on file  Tobacco Use  . Smoking status: Never Smoker  . Smokeless tobacco: Never Used  Substance and Sexual Activity  . Alcohol use: Not Currently  . Drug use: Yes    Types: Marijuana    Comment: last used in 2018  . Sexual activity: Not Currently  Lifestyle  . Physical activity:    Days per week: Not on file    Minutes per session: Not on file  .  Stress: Not on file  Relationships  . Social connections:    Talks on phone: Not on file    Gets together: Not on file    Attends religious service: Not on file    Active member of club or organization: Not on file    Attends meetings of clubs or organizations: Not on file    Relationship status: Not on file  Other Topics Concern  . Not on file  Social History Narrative  . Not on file    Allergies:  Allergies  Allergen Reactions  . Adhesive [Tape] Rash  . Sulfa Antibiotics Nausea And Vomiting    Metabolic Disorder Labs: No results found for: HGBA1C, MPG No results found for: PROLACTIN No results found for: CHOL, TRIG, HDL, CHOLHDL, VLDL, LDLCALC No results found for: TSH  Therapeutic  Level Labs: No results found for: LITHIUM Lab Results  Component Value Date   VALPROATE 48.0 (L) 07/15/2016   VALPROATE 50.7 04/18/2016   No components found for:  CBMZ  Current Medications: Current Outpatient Medications  Medication Sig Dispense Refill  . aspirin 81 MG tablet Take 81 mg by mouth daily.    Marland Kitchen atorvastatin (LIPITOR) 10 MG tablet Take 1 tablet (10 mg total) by mouth daily. For high cholesterol 30 tablet 0  . DULoxetine (CYMBALTA) 60 MG capsule Take 1 capsule (60 mg total) by mouth 2 (two) times daily. For mood control 60 capsule 0  . gabapentin (NEURONTIN) 300 MG capsule TAKE ONE CAPSULE BY MOUTH AT BEDTIME. 90 capsule 1  . letrozole (FEMARA) 2.5 MG tablet Take 1 tablet (2.5 mg total) by mouth daily. 90 tablet 3  . lisinopril (PRINIVIL,ZESTRIL) 20 MG tablet Take 1 tablet (20 mg total) by mouth daily. For high blood pressure 30 tablet 0  . metFORMIN (GLUCOPHAGE) 1000 MG tablet Take 1 tablet (1,000 mg total) by mouth 2 (two) times daily with a meal. 60 tablet 0  . traZODone (DESYREL) 50 MG tablet Take 1 tablet (50 mg total) by mouth at bedtime as needed for sleep. 90 tablet 0   No current facility-administered medications for this visit.      Musculoskeletal: Strength & Muscle Tone: within normal limits Gait & Station: normal Patient leans: N/A  Psychiatric Specialty Exam: ROS  There were no vitals taken for this visit.There is no height or weight on file to calculate BMI.  General Appearance: Fairly Groomed  Eye Contact:  Good  Speech:  Clear and Coherent  Volume:  Normal  Mood:  {BHH MOOD:22306}  Affect:  {Affect (PAA):22687}  Thought Process:  Coherent  Orientation:  Full (Time, Place, and Person)  Thought Content: Logical   Suicidal Thoughts:  {ST/HT (PAA):22692}  Homicidal Thoughts:  {ST/HT (PAA):22692}  Memory:  Immediate;   Good  Judgement:  {Judgement (PAA):22694}  Insight:  {Insight (PAA):22695}  Psychomotor Activity:  Normal  Concentration:   Concentration: Good and Attention Span: Good  Recall:  Good  Fund of Knowledge: Good  Language: Good  Akathisia:  No  Handed:  Right  AIMS (if indicated): not done  Assets:  Communication Skills Desire for Improvement  ADL's:  Intact  Cognition: WNL  Sleep:  {BHH GOOD/FAIR/POOR:22877}   Screenings: AIMS     Admission (Discharged) from 06/05/2017 in [facility]  AIMS Total Score  0    AUDIT     Admission (Discharged) from 06/05/2017 in [facility]  Alcohol Use Disorder Identification Test Final Score (AUDIT)  0  PHQ2-9     Follow Up Appointment 15 from 03/31/2016 in [facility] from 10/21/2015 in [facility]  PHQ-2 Total Score  0  2  (Pended)   PHQ-9 Total Score  -  11  (Pended)        Assessment and Plan:  JOCLYN ALSOBROOK is a 58 y.o. year old female with a history of depression, borderline personality disorder,  diabetes, hypertension, left breast cancer s/p bracketed lumpectomy, radiotherapy on letrozole since 03/03/2016 and irritable bower syndrome , who presents for follow up appointment for No diagnosis found. She was admitted to [facility] 6/2-6/4 after overdosing clonazepam in a suicide attempt in the context of familial discordance.  # borderline personality disorder # MDD, moderate, recurrent without psychotic features  Exam is notable for significantly calmer affect and she is redirected more easily, while she occasionally ruminates on frustration with her family. Patient denies significant mood episode since the last appointment, which coincided with starting to see a therapist.  Psychosocial stressors including family discordance.  Will continue duloxetine to target depression.  We will continue trazodone as needed for insomnia.  Discussed distress tolerance skills.  Discussed cognitive diffusion.  She is encouraged to continue  to see her therapist.   Plan  1. Continue duloxetine60 mg twice a day  2. Continue Trazodone 50 mg at night as needed for sleep 2.Return to clinic in three months for 30 mins [facility] [place] [facility] 300 mg at night for hot flushes  Past trials of medication: Paxil (felt numb), Effexor,Depakote(limited effect),Abilify, Klonopin  The patient demonstrates the following risk factors for suicide: Chronic risk factors for suicide include: psychiatric disorder of depression, substance use disorder, previous suicide attempts overdosing ativan/clonazepamand chronic pain. Acute risk factorsfor suicide include: family or marital conflict, unemployment, social withdrawal/isolation and loss (financial, interpersonal, professional), recent hospitalization.Protective factorsfor this patient include: hope for the future and is amenable to the treatment. Considering these factors,the patient is not at imminent risk of self harm, although she is at chronically elevated risk. Patient isappropriate for outpatient follow up.She denies gun access at home.  Norman Clay, MD 01/26/2018, 10:51 AM

## 2018-01-29 ENCOUNTER — Ambulatory Visit (HOSPITAL_COMMUNITY): Payer: Medicare Other | Admitting: Psychiatry

## 2018-01-30 ENCOUNTER — Other Ambulatory Visit (HOSPITAL_COMMUNITY): Payer: Self-pay | Admitting: Psychiatry

## 2018-01-30 MED ORDER — TRAZODONE HCL 50 MG PO TABS: 50.0000 mg | ORAL_TABLET | Freq: Every evening | ORAL | 0 refills | Status: AC | PRN

## 2018-01-30 MED ORDER — DULOXETINE HCL 60 MG PO CPEP: 60.0000 mg | ORAL_CAPSULE | Freq: Two times a day (BID) | ORAL | 0 refills | Status: AC

## 2018-01-31 ENCOUNTER — Ambulatory Visit (HOSPITAL_COMMUNITY): Payer: Medicare Other | Admitting: Psychiatry

## 2018-02-02 ENCOUNTER — Encounter (HOSPITAL_COMMUNITY): Payer: Self-pay | Admitting: Psychiatry

## 2018-02-02 ENCOUNTER — Ambulatory Visit (INDEPENDENT_AMBULATORY_CARE_PROVIDER_SITE_OTHER): Payer: Medicare Other | Admitting: Psychiatry

## 2018-02-02 DIAGNOSIS — F331 Major depressive disorder, recurrent, moderate: Secondary | ICD-10-CM

## 2018-02-02 DIAGNOSIS — F603 Borderline personality disorder: Secondary | ICD-10-CM

## 2018-02-02 NOTE — Progress Notes (Signed)
   THERAPIST PROGRESS NOTE  Session Time: Friday 02/02/2018 10:14 AM - 10:56 AM  Participation Level: Active  Behavioral Response: CasualAlert/less depressed,   Type of Therapy: Individual Therapy          Treatment Goals addressed: learn and implement effective coping skills to manage stress, anxiety, and depression   Interventions: CBT  Summary: Lindsay Pacheco is a 58 y.o. female who presents with a long standing history of symptoms of depression and anxiety. She is a returning patient to this clinician as she was seen briefly in 2017. Patient had a suicide attempt by taking 10 klonopin and was hospitalized at [facility] in June 2019. patient reports the attempt was triggered by conflict with family and states she was tired of feeling like she was left out. She also states her mother was stress in her at the time as they were living together but mother and now resides with sister. Mother has dementia. She also reports financial stress. Patient states daughter stresses her out sometimes because daughter can become hateful when she becomes anxious. Patient reports symptoms have decreased since discharge from hospital. However, she continues to experience anxiety, periods of depressed mood, and crying spells.  Patient reports continued stress since last session. She reports having conflict with sister regarding a negative text patient sent to brother. Patient reports having anger outburst and becoming hysterical with sister. She also reports again feeling rejected by sister and rest of her family. She coped by taking medication and going to bed. She denies having any suicidal ideations. She reports she later used spirituality and prayed to cope. She reports relationship with daughter has improved. She says she has been trying to control her tone of voice and using deep breathing when stressed by interaction with daughter. She also says daughter has been attending therapy which has been helpful. Patient has  been discharged from seeing psychiatrist [name]. Patient reports last No Show occurred as she overslept and experienced  confusion regarding appointment time. She reports having severe sleep apnea but has not worn CPAP for several years as it feels uncomfortable. She hopes to schedule appointment with a psychiatrist in [place] office for medication management.    Suicidal/Homicidal: Nowithout intent/plan  Therapist Response: reviewed symptoms,  discussed stressors, facilitated expression of thoughts and feelings, validated feelings, praised and reinforced patient's use of prayer in trying to cope, discussed the role of sleep in self-care/coping with depression, and assisted patient develop plan to contact doctor regarding sleep apnea, discussed rationale of  DBT as treatment modality and provided overview of DBT Plan: Return again in 2 weeks.  Diagnosis: Axis I: MDD, Recurrent, Moderate        Axis II: Borderline Personality Dis.    Alonza Smoker, LCSW 02/02/2018

## 2018-03-06 ENCOUNTER — Ambulatory Visit (HOSPITAL_COMMUNITY): Payer: Medicare Other | Admitting: Psychiatry

## 2018-03-20 ENCOUNTER — Ambulatory Visit (HOSPITAL_COMMUNITY): Payer: Medicare Other | Admitting: Psychiatry

## 2018-04-03 ENCOUNTER — Ambulatory Visit (HOSPITAL_COMMUNITY): Payer: Medicare Other | Admitting: Psychiatry

## 2018-04-17 ENCOUNTER — Other Ambulatory Visit: Payer: Self-pay | Admitting: Oncology

## 2018-04-17 DIAGNOSIS — Z170 Estrogen receptor positive status [ER+]: Principal | ICD-10-CM

## 2018-04-17 DIAGNOSIS — C50212 Malignant neoplasm of upper-inner quadrant of left female breast: Secondary | ICD-10-CM

## 2018-05-21 ENCOUNTER — Other Ambulatory Visit: Payer: Self-pay

## 2018-05-21 ENCOUNTER — Encounter (INDEPENDENT_AMBULATORY_CARE_PROVIDER_SITE_OTHER): Payer: Medicare Other | Admitting: Ophthalmology

## 2018-05-21 DIAGNOSIS — H35342 Macular cyst, hole, or pseudohole, left eye: Secondary | ICD-10-CM | POA: Diagnosis not present

## 2018-05-21 DIAGNOSIS — H43813 Vitreous degeneration, bilateral: Secondary | ICD-10-CM | POA: Diagnosis not present

## 2018-05-21 DIAGNOSIS — H35033 Hypertensive retinopathy, bilateral: Secondary | ICD-10-CM

## 2018-05-21 DIAGNOSIS — H2513 Age-related nuclear cataract, bilateral: Secondary | ICD-10-CM

## 2018-05-21 DIAGNOSIS — I10 Essential (primary) hypertension: Secondary | ICD-10-CM | POA: Diagnosis not present

## 2018-05-22 ENCOUNTER — Other Ambulatory Visit: Payer: Self-pay | Admitting: Oncology

## 2018-06-08 NOTE — H&P (Signed)
Lindsay Pacheco is an 58 y.o. female.   Chief Complaint: severe loss of vision left eye HPI: Noted severe vision loss 3 months ago  Past Medical History:  Diagnosis Date  . Anxiety   . Arthritis    "knees" (11/02/2015)  . Breast cancer ([place]) 2017   left breast   . Breast cancer, left breast ([place]) 09/30/2015  . Depression   . Diabetes mellitus, type II ([place])   . Diverticulitis 1987  . Hypertension   . Irritable bowel syndrome (IBS)   . Sleep apnea    does not use CPAP but "I'm suppose to" (11/02/2015)    Past Surgical History:  Procedure Laterality Date  . BREAST BIOPSY Left 09/2015  . BREAST LUMPECTOMY Left 11/02/2015  . BREAST LUMPECTOMY WITH AXILLARY LYMPH NODE BIOPSY Left 11/02/2015   BREAST LUMPECTOMY WITH BRACKETED RADIOACTIVE SEED AND LEFT AXILLARY SENTINEL LYMPH NODE BIOPSY   . BREAST LUMPECTOMY WITH RADIOACTIVE SEED AND SENTINEL LYMPH NODE BIOPSY Left 11/02/2015   Procedure: LEFT BREAST LUMPECTOMY WITH BRACKETED RADIOACTIVE SEED AND LEFT AXILLARY SENTINEL LYMPH NODE BIOPSY;  Surgeon: [name], MD;  Location: [place] [facility];  Service: General;  Laterality: Left;  . COLONOSCOPY N/A 12/09/2015   Procedure: COLONOSCOPY;  Surgeon: [name], MD;  [facility];  Service: Endoscopy;  Laterality: N/A;  1:00  . EVACUATION BREAST HEMATOMA Left 11/02/2015  . EVACUATION BREAST HEMATOMA Left 11/02/2015   Procedure: EVACUATION HEMATOMA BREAST;  Surgeon: [name], MD;  Location: [place];  Service: General;  Laterality: Left;  . JOINT REPLACEMENT    . KNEE ARTHROSCOPY Right 2000s  . LAPAROSCOPIC CHOLECYSTECTOMY  1995  . POLYPECTOMY  12/09/2015   Procedure: POLYPECTOMY;  Surgeon: [name], MD;  [facility];  Service: Endoscopy;;  multiple  . TOTAL KNEE ARTHROPLASTY Right 2011    Family History  Problem Relation Age of Onset  . Anxiety disorder Father   . Depression Father   . Other Father    Abestosis  . Depression Sister   . Diabetes Mother    Social History:  reports that she has never smoked. She has never used smokeless tobacco. She reports previous alcohol use. She reports current drug use. Drug: Marijuana.  Allergies:  Allergies  Allergen Reactions  . Adhesive [Tape] Rash  . Sulfa Antibiotics Nausea And Vomiting    No medications prior to admission.    Review of systems otherwise negative  There were no vitals taken for this visit.  Physical exam: Mental status: oriented x3. Eyes: See eye exam associated with this date of surgery in media tab.  Scanned in by scanning center Ears, Nose, Throat: within normal limits Neck: Within Normal limits General: within normal limits Chest: Within normal limits Breast: deferred Heart: Within normal limits Abdomen: Within normal limits GU: deferred Extremities: within normal limits Skin: within normal limits  Assessment/Plan Macular hole left eye Plan: To [facility] for Pars plana vitrectomy, membrane peel, serum patch, laser, gas injection left eye  Lindsay Pacheco 06/08/2018, 2:50 PM

## 2018-06-29 ENCOUNTER — Other Ambulatory Visit (HOSPITAL_COMMUNITY): Payer: Self-pay

## 2018-06-29 ENCOUNTER — Other Ambulatory Visit (HOSPITAL_COMMUNITY)
Admission: RE | Admit: 2018-06-29 | Discharge: 2018-06-29 | Disposition: A | Discharge status: Home / Self Care | Payer: Medicare Other | Source: Ambulatory Visit | Attending: Ophthalmology | Admitting: Ophthalmology

## 2018-06-29 DIAGNOSIS — Z1159 Encounter for screening for other viral diseases: Secondary | ICD-10-CM | POA: Insufficient documentation

## 2018-06-29 LAB — SARS CORONAVIRUS 2 (TAT 6-24 HRS): SARS Coronavirus 2: NEGATIVE

## 2018-07-02 ENCOUNTER — Other Ambulatory Visit: Payer: Self-pay

## 2018-07-02 ENCOUNTER — Encounter (HOSPITAL_COMMUNITY): Payer: Self-pay | Admitting: *Deleted

## 2018-07-02 MED ORDER — DEXTROSE 5 % IV SOLN
3.0000 g | INTRAVENOUS | Status: AC
  Administered 2018-07-03: 3 g via INTRAVENOUS
  Filled 2018-07-02: qty 3
  Filled 2018-07-02: qty 3000

## 2018-07-02 NOTE — Progress Notes (Signed)
Patient informed of the [place] that is currently in effect.  Patient verbalized understanding.  Patient denies shortness of breath, fever, cough and chest pain.  PCP - [facility] Cardiologist - Denies  Chest x-ray - Denies EKG - DOS 07/03/18 Stress Test - Denies ECHO - Denies Cardiac Cath - Denies  Sleep Study - years ago-unknown location CPAP - Does not use CPAP machine.  Fasting Blood Sugar - 130s Checks Blood Sugar _0-1_ times a day  If your blood sugar is less than 70 mg/dL, you will need to treat for low blood sugar: o Do not take insulin. o Treat a low blood sugar (less than 70 mg/dL) with  cup of clear juice (cranberry or apple), o Recheck blood sugar in 15 minutes after treatment (to make sure it is greater than 70 mg/dL). If your blood sugar is not greater than 70 mg/dL on recheck, call [phone] for further instructions. . Report your blood sugar to the short stay nurse when you get to Short Stay.  Aspirin Instructions:  Last dose Wed. 06/27/18 per MD.  Anesthesia review: yes  STOP Now taking any Aspirin (unless otherwise instructed by your surgeon), Aleve, Naproxen, Ibuprofen, Motrin, Advil, Goody's, BC's, all herbal medications, fish oil, and all vitamins.  Coronavirus Screening Have you your family/experienced the following symptoms:  Cough yes/no: No Fever (>100.94F)  yes/no: No Runny nose yes/no: No Sore throat yes/no: No Difficulty breathing/shortness of breath  yes/no: No  Have you or a family member traveled in the last 14 days and where? yes/no: No

## 2018-07-03 ENCOUNTER — Encounter (HOSPITAL_COMMUNITY)
Admission: RE | Disposition: A | Discharge status: Home Health | Payer: Self-pay | Source: Home / Self Care | Attending: Ophthalmology

## 2018-07-03 ENCOUNTER — Ambulatory Visit (HOSPITAL_COMMUNITY): Payer: Medicare Other | Admitting: Physician Assistant

## 2018-07-03 ENCOUNTER — Ambulatory Visit (HOSPITAL_COMMUNITY)
Admission: RE | Admit: 2018-07-03 | Discharge: 2018-07-04 | Disposition: A | Discharge status: Home Health | Payer: Medicare Other | Attending: Ophthalmology | Admitting: Ophthalmology

## 2018-07-03 ENCOUNTER — Encounter (HOSPITAL_COMMUNITY): Payer: Self-pay | Admitting: Anesthesiology

## 2018-07-03 ENCOUNTER — Encounter (INDEPENDENT_AMBULATORY_CARE_PROVIDER_SITE_OTHER): Payer: Medicare Other | Admitting: Ophthalmology

## 2018-07-03 DIAGNOSIS — H35342 Macular cyst, hole, or pseudohole, left eye: Secondary | ICD-10-CM | POA: Diagnosis present

## 2018-07-03 DIAGNOSIS — G4730 Sleep apnea, unspecified: Secondary | ICD-10-CM | POA: Diagnosis not present

## 2018-07-03 DIAGNOSIS — H35033 Hypertensive retinopathy, bilateral: Secondary | ICD-10-CM

## 2018-07-03 DIAGNOSIS — M170 Bilateral primary osteoarthritis of knee: Secondary | ICD-10-CM | POA: Diagnosis not present

## 2018-07-03 DIAGNOSIS — E119 Type 2 diabetes mellitus without complications: Secondary | ICD-10-CM | POA: Diagnosis not present

## 2018-07-03 DIAGNOSIS — Z882 Allergy status to sulfonamides status: Secondary | ICD-10-CM | POA: Diagnosis not present

## 2018-07-03 DIAGNOSIS — H35343 Macular cyst, hole, or pseudohole, bilateral: Secondary | ICD-10-CM | POA: Diagnosis not present

## 2018-07-03 DIAGNOSIS — I10 Essential (primary) hypertension: Secondary | ICD-10-CM

## 2018-07-03 DIAGNOSIS — Z96651 Presence of right artificial knee joint: Secondary | ICD-10-CM | POA: Diagnosis not present

## 2018-07-03 DIAGNOSIS — Z853 Personal history of malignant neoplasm of breast: Secondary | ICD-10-CM | POA: Insufficient documentation

## 2018-07-03 DIAGNOSIS — H2513 Age-related nuclear cataract, bilateral: Secondary | ICD-10-CM

## 2018-07-03 DIAGNOSIS — F329 Major depressive disorder, single episode, unspecified: Secondary | ICD-10-CM | POA: Diagnosis not present

## 2018-07-03 DIAGNOSIS — Z6841 Body Mass Index (BMI) 40.0 and over, adult: Secondary | ICD-10-CM | POA: Insufficient documentation

## 2018-07-03 DIAGNOSIS — F419 Anxiety disorder, unspecified: Secondary | ICD-10-CM | POA: Diagnosis not present

## 2018-07-03 DIAGNOSIS — H43813 Vitreous degeneration, bilateral: Secondary | ICD-10-CM | POA: Diagnosis not present

## 2018-07-03 HISTORY — PX: GAS/FLUID EXCHANGE: SHX5334

## 2018-07-03 HISTORY — PX: 25 GAUGE PARS PLANA VITRECTOMY WITH 20 GAUGE MVR PORT FOR MACULAR HOLE: SHX6096

## 2018-07-03 HISTORY — DX: Unspecified cataract: H26.9

## 2018-07-03 HISTORY — DX: Hyperlipidemia, unspecified: E78.5

## 2018-07-03 HISTORY — PX: PHOTOCOAGULATION WITH LASER: SHX6027

## 2018-07-03 LAB — BASIC METABOLIC PANEL
Anion gap: 12 (ref 5–15)
BUN: 12 mg/dL (ref 6–20)
CO2: 22 mmol/L (ref 22–32)
Calcium: 9.7 mg/dL (ref 8.9–10.3)
Chloride: 104 mmol/L (ref 98–111)
Creatinine, Ser: 0.85 mg/dL (ref 0.44–1.00)
GFR calc Af Amer: >60 mL/min (ref >60)
GFR calc non Af Amer: >60 mL/min (ref >60)
Glucose, Bld: 170 mg/dL — ABNORMAL HIGH (ref 70–99)
Potassium: 3.5 mmol/L (ref 3.5–5.1)
Sodium: 138 mmol/L (ref 135–145)

## 2018-07-03 LAB — CBC
HCT: 44 % (ref 36.0–46.0)
Hemoglobin: 13.8 g/dL (ref 12.0–15.0)
MCH: 27.2 pg (ref 26.0–34.0)
MCHC: 31.4 g/dL (ref 30.0–36.0)
MCV: 86.8 fL (ref 80.0–100.0)
Platelets: 346 10*3/uL (ref 150–400)
RBC: 5.07 MIL/uL (ref 3.87–5.11)
RDW: 13.8 % (ref 11.5–15.5)
WBC: 8.3 10*3/uL (ref 4.0–10.5)
nRBC: 0 % (ref 0.0–0.2)

## 2018-07-03 LAB — GLUCOSE, CAPILLARY
Glucose-Capillary: 161 mg/dL — ABNORMAL HIGH (ref 70–99)
Glucose-Capillary: 156 mg/dL — ABNORMAL HIGH (ref 70–99)
Glucose-Capillary: 124 mg/dL — ABNORMAL HIGH (ref 70–99)
Glucose-Capillary: 168 mg/dL — ABNORMAL HIGH (ref 70–99)
Glucose-Capillary: 250 mg/dL — ABNORMAL HIGH (ref 70–99)

## 2018-07-03 LAB — AUTOLOGOUS SERUM PATCH PREP

## 2018-07-03 SURGERY — 25 GAUGE PARS PLANA VITRECTOMY WITH 20 GAUGE MVR PORT FOR MACULAR HOLE
Anesthesia: General | Site: Eye | Laterality: Left

## 2018-07-03 SURGERY — 25 GAUGE PARS PLANA VITRECTOMY WITH 20 GAUGE MVR PORT FOR MACULAR HOLE
Anesthesia: General | Laterality: Left

## 2018-07-03 MED ORDER — LIDOCAINE HCL 2 % IJ SOLN
INTRAMUSCULAR | Status: AC
  Filled 2018-07-03: qty 20

## 2018-07-03 MED ORDER — BSS PLUS IO SOLN
INTRAOCULAR | Status: AC
  Filled 2018-07-03: qty 500

## 2018-07-03 MED ORDER — MAGNESIUM HYDROXIDE 400 MG/5ML PO SUSP: 15.0000 mL | Freq: Four times a day (QID) | ORAL | Status: DC | PRN

## 2018-07-03 MED ORDER — PHENYLEPHRINE 40 MCG/ML (10ML) SYRINGE FOR IV PUSH (FOR BLOOD PRESSURE SUPPORT)
PREFILLED_SYRINGE | INTRAVENOUS | Status: AC
  Filled 2018-07-03: qty 10

## 2018-07-03 MED ORDER — BACITRACIN-POLYMYXIN B 500-10000 UNIT/GM OP OINT
1.0000 "application " | TOPICAL_OINTMENT | Freq: Three times a day (TID) | OPHTHALMIC | Status: DC
  Filled 2018-07-03: qty 3.5

## 2018-07-03 MED ORDER — ACETAMINOPHEN 325 MG PO TABS: 325.0000 mg | ORAL_TABLET | ORAL | Status: DC | PRN

## 2018-07-03 MED ORDER — DEXAMETHASONE SODIUM PHOSPHATE 10 MG/ML IJ SOLN
INTRAMUSCULAR | Status: AC
  Filled 2018-07-03: qty 1

## 2018-07-03 MED ORDER — INSULIN ASPART 100 UNIT/ML ~~LOC~~ SOLN
0.0000 [IU] | SUBCUTANEOUS | Status: DC
  Administered 2018-07-03: 5 [IU] via SUBCUTANEOUS
  Administered 2018-07-03 – 2018-07-04 (×2): 3 [IU] via SUBCUTANEOUS
  Administered 2018-07-04: 5 [IU] via SUBCUTANEOUS

## 2018-07-03 MED ORDER — EPINEPHRINE PF 1 MG/ML IJ SOLN
INTRAMUSCULAR | Status: AC
  Filled 2018-07-03: qty 1

## 2018-07-03 MED ORDER — DEXAMETHASONE SODIUM PHOSPHATE 10 MG/ML IJ SOLN
INTRAMUSCULAR | Status: DC | PRN
  Administered 2018-07-03: 5 mg via INTRAVENOUS

## 2018-07-03 MED ORDER — LIDOCAINE 2% (20 MG/ML) 5 ML SYRINGE
INTRAMUSCULAR | Status: AC
  Filled 2018-07-03: qty 5

## 2018-07-03 MED ORDER — BACITRACIN-POLYMYXIN B 500-10000 UNIT/GM OP OINT
TOPICAL_OINTMENT | OPHTHALMIC | Status: AC
  Filled 2018-07-03: qty 3.5

## 2018-07-03 MED ORDER — BSS IO SOLN
INTRAOCULAR | Status: AC
  Filled 2018-07-03: qty 15

## 2018-07-03 MED ORDER — HYDROCODONE-ACETAMINOPHEN 10-325 MG PO TABS: 1.0000 | ORAL_TABLET | Freq: Four times a day (QID) | ORAL | Status: DC | PRN

## 2018-07-03 MED ORDER — GLIMEPIRIDE 4 MG PO TABS: 2.0000 mg | ORAL_TABLET | Freq: Every day | ORAL | Status: DC

## 2018-07-03 MED ORDER — LISINOPRIL-HYDROCHLOROTHIAZIDE 20-12.5 MG PO TABS: 1.0000 | ORAL_TABLET | Freq: Every day | ORAL | Status: DC

## 2018-07-03 MED ORDER — BUPIVACAINE HCL (PF) 0.75 % IJ SOLN
INTRAMUSCULAR | Status: DC | PRN
  Administered 2018-07-03: 10 mL

## 2018-07-03 MED ORDER — ATROPINE SULFATE 1 % OP SOLN
OPHTHALMIC | Status: DC | PRN
  Administered 2018-07-03: 1 [drp] via OPHTHALMIC

## 2018-07-03 MED ORDER — PROPOFOL 10 MG/ML IV BOLUS: INTRAVENOUS | Status: DC | PRN

## 2018-07-03 MED ORDER — DEXAMETHASONE SODIUM PHOSPHATE 10 MG/ML IJ SOLN
INTRAMUSCULAR | Status: DC | PRN
  Administered 2018-07-03: 10 mg

## 2018-07-03 MED ORDER — MORPHINE SULFATE (PF) 2 MG/ML IV SOLN: 1.0000 mg | INTRAVENOUS | Status: DC | PRN

## 2018-07-03 MED ORDER — ONDANSETRON HCL 4 MG/2ML IJ SOLN
4.0000 mg | Freq: Once | INTRAMUSCULAR | Status: AC
  Administered 2018-07-03: 4 mg via INTRAVENOUS
  Filled 2018-07-03: qty 2

## 2018-07-03 MED ORDER — FENTANYL CITRATE (PF) 100 MCG/2ML IJ SOLN
INTRAMUSCULAR | Status: DC | PRN
  Administered 2018-07-03 (×2): 50 ug via INTRAVENOUS

## 2018-07-03 MED ORDER — LATANOPROST 0.005 % OP SOLN
1.0000 [drp] | Freq: Every day | OPHTHALMIC | Status: DC
  Filled 2018-07-03: qty 2.5

## 2018-07-03 MED ORDER — ONDANSETRON HCL 4 MG/2ML IJ SOLN
INTRAMUSCULAR | Status: AC
  Filled 2018-07-03: qty 2

## 2018-07-03 MED ORDER — GATIFLOXACIN 0.5 % OP SOLN
1.0000 [drp] | OPHTHALMIC | Status: AC | PRN
  Administered 2018-07-03 (×3): 1 [drp] via OPHTHALMIC
  Filled 2018-07-03: qty 2.5

## 2018-07-03 MED ORDER — ROCURONIUM BROMIDE 10 MG/ML (PF) SYRINGE
PREFILLED_SYRINGE | INTRAVENOUS | Status: AC
  Filled 2018-07-03: qty 10

## 2018-07-03 MED ORDER — MIDAZOLAM HCL 2 MG/2ML IJ SOLN
INTRAMUSCULAR | Status: AC
  Filled 2018-07-03: qty 2

## 2018-07-03 MED ORDER — SODIUM HYALURONATE 10 MG/ML IO SOLN
INTRAOCULAR | Status: AC
  Filled 2018-07-03: qty 0.85

## 2018-07-03 MED ORDER — LIDOCAINE 2% (20 MG/ML) 5 ML SYRINGE
INTRAMUSCULAR | Status: DC | PRN
  Administered 2018-07-03: 60 mg via INTRAVENOUS

## 2018-07-03 MED ORDER — FENTANYL CITRATE (PF) 250 MCG/5ML IJ SOLN
INTRAMUSCULAR | Status: AC
  Filled 2018-07-03: qty 5

## 2018-07-03 MED ORDER — BISMUTH SUBSALICYLATE 262 MG/15ML PO SUSP
30.0000 mL | Freq: Four times a day (QID) | ORAL | Status: DC | PRN
  Filled 2018-07-03: qty 236

## 2018-07-03 MED ORDER — PHENYLEPHRINE HCL (PRESSORS) 10 MG/ML IV SOLN
INTRAVENOUS | Status: DC | PRN
  Administered 2018-07-03: 120 ug via INTRAVENOUS
  Administered 2018-07-03 (×5): 80 ug via INTRAVENOUS

## 2018-07-03 MED ORDER — ATORVASTATIN CALCIUM 10 MG PO TABS: 10.0000 mg | ORAL_TABLET | Freq: Every day | ORAL | Status: DC

## 2018-07-03 MED ORDER — LETROZOLE 2.5 MG PO TABS
2.5000 mg | ORAL_TABLET | Freq: Every day | ORAL | Status: DC
  Administered 2018-07-03: 2.5 mg via ORAL
  Filled 2018-07-03 (×2): qty 1

## 2018-07-03 MED ORDER — ASPIRIN 81 MG PO CHEW
81.0000 mg | CHEWABLE_TABLET | Freq: Every day | ORAL | Status: DC
  Administered 2018-07-03: 81 mg via ORAL
  Filled 2018-07-03: qty 1

## 2018-07-03 MED ORDER — SCOPOLAMINE 1 MG/3DAYS TD PT72
1.0000 | MEDICATED_PATCH | Freq: Once | TRANSDERMAL | Status: DC
  Administered 2018-07-03: 1.5 mg via TRANSDERMAL
  Filled 2018-07-03: qty 1

## 2018-07-03 MED ORDER — TRAZODONE HCL 50 MG PO TABS
50.0000 mg | ORAL_TABLET | Freq: Every evening | ORAL | Status: DC | PRN
  Administered 2018-07-03: 23:00:00 50 mg via ORAL
  Filled 2018-07-03: qty 1

## 2018-07-03 MED ORDER — HYPROMELLOSE (GONIOSCOPIC) 2.5 % OP SOLN
OPHTHALMIC | Status: AC
  Filled 2018-07-03: qty 15

## 2018-07-03 MED ORDER — ROCURONIUM BROMIDE 50 MG/5ML IV SOSY
PREFILLED_SYRINGE | INTRAVENOUS | Status: DC | PRN
  Administered 2018-07-03: 50 mg via INTRAVENOUS
  Administered 2018-07-03: 20 mg via INTRAVENOUS

## 2018-07-03 MED ORDER — EPHEDRINE 5 MG/ML INJ
INTRAVENOUS | Status: AC
  Filled 2018-07-03: qty 10

## 2018-07-03 MED ORDER — PROMETHAZINE HCL 25 MG/ML IJ SOLN: 6.2500 mg | INTRAMUSCULAR | Status: DC | PRN

## 2018-07-03 MED ORDER — BUPIVACAINE-EPINEPHRINE (PF) 0.25% -1:200000 IJ SOLN
INTRAMUSCULAR | Status: AC
  Filled 2018-07-03: qty 30

## 2018-07-03 MED ORDER — BUPIVACAINE HCL (PF) 0.75 % IJ SOLN
INTRAMUSCULAR | Status: AC
  Filled 2018-07-03: qty 10

## 2018-07-03 MED ORDER — ACETAZOLAMIDE SODIUM 500 MG IJ SOLR
INTRAMUSCULAR | Status: AC
  Filled 2018-07-03: qty 500

## 2018-07-03 MED ORDER — ACETAMINOPHEN 500 MG PO TABS: 1000.0000 mg | ORAL_TABLET | Freq: Every day | ORAL | Status: DC | PRN

## 2018-07-03 MED ORDER — PHENYLEPHRINE 40 MCG/ML (10ML) SYRINGE FOR IV PUSH (FOR BLOOD PRESSURE SUPPORT)
PREFILLED_SYRINGE | INTRAVENOUS | Status: AC
  Filled 2018-07-03: qty 30

## 2018-07-03 MED ORDER — LORATADINE 10 MG PO TABS
10.0000 mg | ORAL_TABLET | Freq: Every day | ORAL | Status: DC
  Administered 2018-07-03: 10 mg via ORAL
  Filled 2018-07-03: qty 1

## 2018-07-03 MED ORDER — TRIAMCINOLONE ACETONIDE 40 MG/ML IJ SUSP
INTRAMUSCULAR | Status: AC
  Filled 2018-07-03: qty 5

## 2018-07-03 MED ORDER — PHENYLEPHRINE HCL 2.5 % OP SOLN
1.0000 [drp] | OPHTHALMIC | Status: AC | PRN
  Administered 2018-07-03 (×3): 1 [drp] via OPHTHALMIC
  Filled 2018-07-03: qty 2

## 2018-07-03 MED ORDER — PROPOFOL 10 MG/ML IV BOLUS
INTRAVENOUS | Status: AC
  Filled 2018-07-03: qty 20

## 2018-07-03 MED ORDER — MIDAZOLAM HCL 2 MG/2ML IJ SOLN: 0.5000 mg | Freq: Once | INTRAMUSCULAR | Status: DC | PRN

## 2018-07-03 MED ORDER — STERILE WATER FOR INJECTION IJ SOLN
INTRAMUSCULAR | Status: AC
  Filled 2018-07-03: qty 20

## 2018-07-03 MED ORDER — ACETAZOLAMIDE SODIUM 500 MG IJ SOLR
500.0000 mg | Freq: Once | INTRAMUSCULAR | Status: AC
  Administered 2018-07-04: 500 mg via INTRAVENOUS
  Filled 2018-07-03: qty 500

## 2018-07-03 MED ORDER — BRIMONIDINE TARTRATE 0.2 % OP SOLN
1.0000 [drp] | Freq: Two times a day (BID) | OPHTHALMIC | Status: DC
  Filled 2018-07-03: qty 5

## 2018-07-03 MED ORDER — ONDANSETRON HCL 4 MG/2ML IJ SOLN
4.0000 mg | Freq: Four times a day (QID) | INTRAMUSCULAR | Status: DC
  Administered 2018-07-03 – 2018-07-04 (×3): 4 mg via INTRAVENOUS
  Filled 2018-07-03 (×3): qty 2

## 2018-07-03 MED ORDER — PROPOFOL 10 MG/ML IV BOLUS
INTRAVENOUS | Status: DC | PRN
  Administered 2018-07-03: 200 mg via INTRAVENOUS
  Administered 2018-07-03: 50 mg via INTRAVENOUS

## 2018-07-03 MED ORDER — ATROPINE SULFATE 1 % OP SOLN
OPHTHALMIC | Status: AC
  Filled 2018-07-03: qty 5

## 2018-07-03 MED ORDER — SUCCINYLCHOLINE CHLORIDE 20 MG/ML IJ SOLN
INTRAMUSCULAR | Status: DC | PRN
  Administered 2018-07-03: 120 mg via INTRAVENOUS

## 2018-07-03 MED ORDER — FENTANYL CITRATE (PF) 100 MCG/2ML IJ SOLN: 25.0000 ug | INTRAMUSCULAR | Status: DC | PRN

## 2018-07-03 MED ORDER — SUCCINYLCHOLINE CHLORIDE 200 MG/10ML IV SOSY
PREFILLED_SYRINGE | INTRAVENOUS | Status: AC
  Filled 2018-07-03: qty 10

## 2018-07-03 MED ORDER — HYDROCODONE-ACETAMINOPHEN 5-325 MG PO TABS: 1.0000 | ORAL_TABLET | ORAL | Status: DC | PRN

## 2018-07-03 MED ORDER — SODIUM HYALURONATE 10 MG/ML IO SOLN
INTRAOCULAR | Status: DC | PRN
  Administered 2018-07-03: 0.85 mL via INTRAOCULAR

## 2018-07-03 MED ORDER — CALCIUM CARBONATE ANTACID 500 MG PO CHEW: 2.0000 | CHEWABLE_TABLET | Freq: Every day | ORAL | Status: DC | PRN

## 2018-07-03 MED ORDER — SUGAMMADEX SODIUM 500 MG/5ML IV SOLN
INTRAVENOUS | Status: AC
  Filled 2018-07-03: qty 5

## 2018-07-03 MED ORDER — EPINEPHRINE PF 1 MG/ML IJ SOLN
INTRAOCULAR | Status: DC | PRN
  Administered 2018-07-03: 14:00:00 500 mL

## 2018-07-03 MED ORDER — SODIUM CHLORIDE 0.45 % IV SOLN
INTRAVENOUS | Status: DC
  Administered 2018-07-03: 17:00:00 via INTRAVENOUS

## 2018-07-03 MED ORDER — SODIUM CHLORIDE (PF) 0.9 % IJ SOLN
INTRAMUSCULAR | Status: AC
  Filled 2018-07-03: qty 10

## 2018-07-03 MED ORDER — GABAPENTIN 300 MG PO CAPS
300.0000 mg | ORAL_CAPSULE | Freq: Every day | ORAL | Status: DC
  Administered 2018-07-03: 300 mg via ORAL
  Filled 2018-07-03: qty 1

## 2018-07-03 MED ORDER — POLYMYXIN B SULFATE 500000 UNITS IJ SOLR
INTRAMUSCULAR | Status: AC
  Filled 2018-07-03: qty 500000

## 2018-07-03 MED ORDER — DORZOLAMIDE HCL 2 % OP SOLN
1.0000 [drp] | Freq: Three times a day (TID) | OPHTHALMIC | Status: DC
  Filled 2018-07-03: qty 10

## 2018-07-03 MED ORDER — BACITRACIN-POLYMYXIN B 500-10000 UNIT/GM OP OINT
TOPICAL_OINTMENT | OPHTHALMIC | Status: DC | PRN
  Administered 2018-07-03: 1

## 2018-07-03 MED ORDER — SODIUM CHLORIDE 0.9 % IV SOLN
INTRAVENOUS | Status: DC
  Administered 2018-07-03 (×2): via INTRAVENOUS

## 2018-07-03 MED ORDER — DULOXETINE HCL 60 MG PO CPEP
60.0000 mg | ORAL_CAPSULE | Freq: Every day | ORAL | Status: DC
  Filled 2018-07-03: qty 1

## 2018-07-03 MED ORDER — CYCLOPENTOLATE HCL 1 % OP SOLN
1.0000 [drp] | OPHTHALMIC | Status: AC | PRN
  Administered 2018-07-03 (×3): 1 [drp] via OPHTHALMIC
  Filled 2018-07-03: qty 2

## 2018-07-03 MED ORDER — PREDNISOLONE ACETATE 1 % OP SUSP
1.0000 [drp] | Freq: Four times a day (QID) | OPHTHALMIC | Status: DC
  Filled 2018-07-03: qty 5

## 2018-07-03 MED ORDER — MEPERIDINE HCL 25 MG/ML IJ SOLN: 6.2500 mg | INTRAMUSCULAR | Status: DC | PRN

## 2018-07-03 MED ORDER — MIDAZOLAM HCL 5 MG/5ML IJ SOLN
INTRAMUSCULAR | Status: DC | PRN
  Administered 2018-07-03: 2 mg via INTRAVENOUS

## 2018-07-03 MED ORDER — SUCCINYLCHOLINE CHLORIDE 20 MG/ML IJ SOLN: INTRAMUSCULAR | Status: DC | PRN

## 2018-07-03 MED ORDER — TROPICAMIDE 1 % OP SOLN
1.0000 [drp] | OPHTHALMIC | Status: AC | PRN
  Administered 2018-07-03 (×3): 1 [drp] via OPHTHALMIC
  Filled 2018-07-03: qty 15

## 2018-07-03 MED ORDER — TETRACAINE HCL 0.5 % OP SOLN
2.0000 [drp] | Freq: Once | OPHTHALMIC | Status: DC
  Filled 2018-07-03: qty 4

## 2018-07-03 MED ORDER — SUGAMMADEX SODIUM 200 MG/2ML IV SOLN
INTRAVENOUS | Status: DC | PRN
  Administered 2018-07-03: 500 mg via INTRAVENOUS

## 2018-07-03 MED ORDER — STERILE WATER FOR INJECTION IJ SOLN
INTRAMUSCULAR | Status: DC | PRN
  Administered 2018-07-03: 20 mL

## 2018-07-03 MED ORDER — HYALURONIDASE HUMAN 150 UNIT/ML IJ SOLN
INTRAMUSCULAR | Status: AC
  Filled 2018-07-03: qty 1

## 2018-07-03 MED ORDER — LISINOPRIL 20 MG PO TABS
20.0000 mg | ORAL_TABLET | Freq: Every day | ORAL | Status: DC
  Administered 2018-07-03: 20 mg via ORAL
  Filled 2018-07-03: qty 1

## 2018-07-03 MED ORDER — GATIFLOXACIN 0.5 % OP SOLN
1.0000 [drp] | Freq: Four times a day (QID) | OPHTHALMIC | Status: DC
  Filled 2018-07-03: qty 2.5

## 2018-07-03 MED ORDER — ONDANSETRON HCL 4 MG/2ML IJ SOLN
INTRAMUSCULAR | Status: DC | PRN
  Administered 2018-07-03: 4 mg via INTRAVENOUS

## 2018-07-03 MED ORDER — HYDROCHLOROTHIAZIDE 12.5 MG PO CAPS
12.5000 mg | ORAL_CAPSULE | Freq: Every day | ORAL | Status: DC
  Administered 2018-07-03: 12.5 mg via ORAL
  Filled 2018-07-03: qty 1

## 2018-07-03 MED ORDER — CEFTAZIDIME 1 G IJ SOLR
INTRAMUSCULAR | Status: AC
  Filled 2018-07-03: qty 1

## 2018-07-03 MED ORDER — ONDANSETRON HCL 4 MG/2ML IJ SOLN
INTRAMUSCULAR | Status: AC
  Administered 2018-07-03: 4 mg
  Filled 2018-07-03: qty 2

## 2018-07-03 MED ORDER — TEMAZEPAM 15 MG PO CAPS: 15.0000 mg | ORAL_CAPSULE | Freq: Every evening | ORAL | Status: DC | PRN

## 2018-07-03 MED ORDER — METFORMIN HCL 500 MG PO TABS
1000.0000 mg | ORAL_TABLET | Freq: Two times a day (BID) | ORAL | Status: DC
  Administered 2018-07-03: 1000 mg via ORAL
  Filled 2018-07-03: qty 2

## 2018-07-03 SURGICAL SUPPLY — 69 items
BALL CTTN LRG ABS STRL LF (GAUZE/BANDAGES/DRESSINGS) ×6
BLADE EYE CATARACT 19 1.4 BEAV (BLADE) IMPLANT
BLADE MVR KNIFE 19G (BLADE) IMPLANT
BLADE MVR KNIFE 20G (BLADE) ×4 IMPLANT
BNDG EYE OVAL (GAUZE/BANDAGES/DRESSINGS) ×2 IMPLANT
CANNULA VLV SOFT TIP 25G (OPHTHALMIC) ×2 IMPLANT
CANNULA VLV SOFT TIP 25GA (OPHTHALMIC) ×8 IMPLANT
CORD BIPOLAR FORCEPS 12FT (ELECTRODE) ×4 IMPLANT
COTTONBALL LRG STERILE PKG (GAUZE/BANDAGES/DRESSINGS) ×12 IMPLANT
COVER MAYO STAND STRL (DRAPES) IMPLANT
COVER WAND RF STERILE (DRAPES) ×4 IMPLANT
DRAPE INCISE 51X51 W/FILM STRL (DRAPES) IMPLANT
DRAPE OPHTHALMIC 77X100 STRL (CUSTOM PROCEDURE TRAY) ×4 IMPLANT
ERASER HMR WETFIELD 23G BP (MISCELLANEOUS) ×2 IMPLANT
FILTER BLUE MILLIPORE (MISCELLANEOUS) IMPLANT
FILTER STRAW FLUID ASPIR (MISCELLANEOUS) ×4 IMPLANT
FORCEPS GRIESHABER ILM 25G A (INSTRUMENTS) ×2 IMPLANT
GAS AUTO FILL CONSTEL (OPHTHALMIC) ×8
GAS AUTO FILL CONSTELLATION (OPHTHALMIC) ×2 IMPLANT
GLOVE SS BIOGEL STRL SZ 6.5 (GLOVE) ×2 IMPLANT
GLOVE SS BIOGEL STRL SZ 7 (GLOVE) ×2 IMPLANT
GLOVE SUPERSENSE BIOGEL SZ 6.5 (GLOVE) ×2
GLOVE SUPERSENSE BIOGEL SZ 7 (GLOVE) ×2
GLOVE SURG 8.5 LATEX PF (GLOVE) ×4 IMPLANT
GOWN STRL REUS W/ TWL LRG LVL3 (GOWN DISPOSABLE) ×6 IMPLANT
GOWN STRL REUS W/TWL LRG LVL3 (GOWN DISPOSABLE) ×12
HANDLE PNEUMATIC FOR CONSTEL (OPHTHALMIC) ×2 IMPLANT
KIT BASIN OR (CUSTOM PROCEDURE TRAY) ×4 IMPLANT
KNIFE GRIESHABER SHARP 2.5MM (MISCELLANEOUS) IMPLANT
MICROPICK 25G (MISCELLANEOUS)
NDL 18GX1X1/2 (RX/OR ONLY) (NEEDLE) ×2 IMPLANT
NDL 25GX 5/8IN NON SAFETY (NEEDLE) ×2 IMPLANT
NDL FILTER BLUNT 18X1 1/2 (NEEDLE) IMPLANT
NDL HYPO 30X.5 LL (NEEDLE) IMPLANT
NEEDLE 18GX1X1/2 (RX/OR ONLY) (NEEDLE) ×8 IMPLANT
NEEDLE 25GX 5/8IN NON SAFETY (NEEDLE) ×4 IMPLANT
NEEDLE FILTER BLUNT 18X 1/2SAF (NEEDLE) ×2
NEEDLE FILTER BLUNT 18X1 1/2 (NEEDLE) ×2 IMPLANT
NEEDLE HYPO 30X.5 LL (NEEDLE) IMPLANT
NS IRRIG 1000ML POUR BTL (IV SOLUTION) ×4 IMPLANT
PACK FRAGMATOME (OPHTHALMIC) IMPLANT
PACK VITRECTOMY CUSTOM (CUSTOM PROCEDURE TRAY) ×4 IMPLANT
PAD ARMBOARD 7.5X6 YLW CONV (MISCELLANEOUS) ×8 IMPLANT
PAK PIK VITRECTOMY CVS 25GA (OPHTHALMIC) ×4 IMPLANT
PIC ILLUMINATED 25G (OPHTHALMIC) ×4
PICK MICROPICK 25G (MISCELLANEOUS) IMPLANT
PIK ILLUMINATED 25G (OPHTHALMIC) ×2 IMPLANT
PROBE LASER ILLUM FLEX CVD 25G (OPHTHALMIC) IMPLANT
REPL STRA BRUSH NDL (NEEDLE) ×2 IMPLANT
REPL STRA BRUSH NEEDLE (NEEDLE) ×4 IMPLANT
RESERVOIR BACK FLUSH (MISCELLANEOUS) ×4 IMPLANT
ROLLS DENTAL (MISCELLANEOUS) ×8 IMPLANT
SCRAPER DIAMOND 25GA (OPHTHALMIC RELATED) IMPLANT
SCRAPER DIAMOND DUST MEMBRANE (MISCELLANEOUS) ×4 IMPLANT
SHIELD EYE LENSE ONLY DISP (GAUZE/BANDAGES/DRESSINGS) ×2 IMPLANT
SPONGE SURGIFOAM ABS GEL 12-7 (HEMOSTASIS) ×6 IMPLANT
STOPCOCK 4 WAY LG BORE MALE ST (IV SETS) IMPLANT
SUT CHROMIC 7 0 TG140 8 (SUTURE) IMPLANT
SUT ETHILON 9 0 TG140 8 (SUTURE) ×4 IMPLANT
SUT POLY NON ABSORB 10-0 8 STR (SUTURE) IMPLANT
SUT VICRYL 7 0 TG140 8 (SUTURE) ×2 IMPLANT
SYR 10ML LL (SYRINGE) IMPLANT
SYR 20CC LL (SYRINGE) ×6 IMPLANT
SYR 5ML LL (SYRINGE) IMPLANT
SYR BULB 3OZ (MISCELLANEOUS) ×4 IMPLANT
SYR TB 1ML LUER SLIP (SYRINGE) ×4 IMPLANT
TUBING HIGH PRESS EXTEN 6IN (TUBING) IMPLANT
WATER STERILE IRR 1000ML POUR (IV SOLUTION) ×4 IMPLANT
WIPE INSTRUMENT VISIWIPE 73X73 (MISCELLANEOUS) IMPLANT

## 2018-07-03 NOTE — Anesthesia Postprocedure Evaluation (Signed)
Anesthesia Post Note  Patient: VENA BASSINGER  Procedure(s) Performed: 25 GAUGE PARS PLANA VITRECTOMY WITH 20 GAUGE MVR PORT FOR MACULAR HOLE (Left ) Photocoagulation With Laser (Left Eye) Gas/Fluid Exchange (Left Eye)     Patient location during evaluation: PACU Anesthesia Type: General Level of consciousness: awake and alert Pain management: pain level controlled Vital Signs Assessment: post-procedure vital signs reviewed and stable Respiratory status: spontaneous breathing, nonlabored ventilation, respiratory function stable and patient connected to nasal cannula oxygen Cardiovascular status: blood pressure returned to baseline and stable Postop Assessment: no apparent nausea or vomiting Anesthetic complications: no    Last Vitals:  Vitals:   07/03/18 1445 07/03/18 1500  BP: (!) 156/91 126/73  Pulse: 88 89  Resp: 18 18  Temp:  36.4 C  SpO2: 97% 96%    Last Pain:  Vitals:   07/03/18 1445  TempSrc:   PainSc: 0-No pain                 Ryan P Ellender

## 2018-07-03 NOTE — Anesthesia Procedure Notes (Signed)
Procedure Name: Intubation Date/Time: 07/03/2018 1:05 PM Performed by: Scheryl Darter, CRNA Pre-anesthesia Checklist: Patient identified, Emergency Drugs available, Suction available and Patient being monitored Patient Re-evaluated:Patient Re-evaluated prior to induction Oxygen Delivery Method: Circle System Utilized Preoxygenation: Pre-oxygenation with 100% oxygen Induction Type: IV induction Ventilation: Mask ventilation without difficulty Grade View: Grade I Tube type: Oral Tube size: 7.5 mm Number of attempts: 1 Airway Equipment and Method: Stylet and Oral airway Placement Confirmation: ETT inserted through vocal cords under direct vision,  positive ETCO2 and breath sounds checked- equal and bilateral Secured at: 23 cm Tube secured with: Tape Dental Injury: Teeth and Oropharynx as per pre-operative assessment

## 2018-07-03 NOTE — Brief Op Note (Signed)
Brief Operative note   Preoperative diagnosis:  macular hole left eye Postoperative diagnosis same   Procedures: repair of macular hole with pars plana vitrectomy, membrane peel, Internal limiting membrane peel, laser, serum patch, gas injection, left eye  Surgeon:  [name], MD...  Assistant:  Deatra Ina SA    Anesthesia: General  Specimen: none  Estimated blood loss:  1cc  Complications: none  Patient sent to PACU in good condition  Composed by [name] MD  Dictation number: [id number]

## 2018-07-03 NOTE — Op Note (Signed)
Lindsay Pacheco, Lindsay Pacheco MEDICAL RECORD [id number] ACCOUNT [id number] DATE OF BIRTH:1960/09/23 FACILITY: [facility] LOCATION: [place], [place]  OPERATIVE REPORT  DATE OF PROCEDURE:  07/03/2018  ADMISSION DIAGNOSIS:  Macular hole, left eye.  PROCEDURES:  Repair of macular hole with pars plana vitrectomy, retinal photocoagulation, membrane peel, gas fluid exchange, ILM peel, serum patch.  C3F8, 14% injection all in the left eye.  SURGEON:  Tempie Hoist, MD  ASSISTANT:  Deatra Ina, SA.  ANESTHESIA:  General.  DESCRIPTION OF PROCEDURE: Usual prep and drape.  The indirect ophthalmoscope laser was moved into place, 679 burns were placed in the retinal periphery around weak areas of retina.  The power was 500 milliwatts 1000 microns each and 0.1 seconds each.  Attention was then carried  to the pars plana area.  A scleral wound was made for MVR incision at 2 o'clock extending from 2 to 5 mm back from the limbus.  A frown incision was made, then MVR incision into the vitreous cavity.  A 25-gauge trocar was placed at 4 and 10 o'clock.   Infusion at 4 o'clock.  Contact lens ring anchored into place at the 3 and 9 o'clock.  Provisc placed on the corneal surface and the flat contact lens was placed.  Pars plana vitrectomy was begun just behind the cataractous lens.  Dense white membranes  were encountered.  These were carefully removed under low suction and rapid cutting.  Partial vitreous detachment was seen and a full vitreous detachment was performed during the procedure.  The posterior vitreous was elevated and lifted into the  vitrectomy instrument.  The prismatic 30-degree lens was brought into place and pars plana vitrectomy was carried out into the mid periphery for 360 degrees and then into the far periphery for 360 degrees.  No retinal breaks were seen.  The flat contact  lens was placed and the diamond dusted membrane scraper was used to engage the internal limiting  membrane and peel it from its attachments to the retinal surface in the macular hole region.  Care was taken to free up the macular hole edges so that they  would lie flat.  No bleeding occurred.  The magnifying contact lens was then placed onto a layer of methylcellulose and onto the cornea.  Additional manipulation of the macular hole and the ILM was then performed.  Internal limiting membrane fragments  were removed with the vitreous cutter.  A total gas fluid exchange was carried out with the silicone tipped suction device.  Sufficient time was allowed for additional fluid to track down the walls of the eye and collect in the posterior segment.  Serum  patch was prepared during this time and C3F8 was prepared to a 14% concentration.  Silicone tipped suction device removed additional fluid which had collected in the posterior segment.  Serum patch was delivered.  Additional serum was removed from the  retinal surface.  A gas fluid exchange was carried out with C3F8, 14% infusion and careful suction with the vitreous cutter.  The instruments were removed from the eye and 9-0 nylon was used to close the sclerotomy site.  It was tested and found to be  secure.  The conjunctiva was closed with wet field cautery.  The 25-gauge instruments were removed and trocars were removed.  These wounds were tested and found to be secure.  Polymyxin and ceftazidime were rinsed around the globe for antibiotic  coverage.  Decadron 10 mg was injected into the lower subconjunctival space.  Marcaine was injected around the globe for postoperative pain.  Atropine solution was applied.  Polysporin ophthalmic ointment, a patch and a shield were placed.  The patient  was awakened and taken to recovery in satisfactory condition.  Closing pressure was 10 mm with the [place] tonometer.  COMPLICATIONS:  None.  DURATION:  One hour.  TN/NUANCE  D:07/03/2018 T:07/03/2018 JOB:[id number]

## 2018-07-03 NOTE — H&P (Signed)
I examined the patient today and there is no change in the medical status 

## 2018-07-03 NOTE — Transfer of Care (Signed)
Immediate Anesthesia Transfer of Care Note  Patient: Lindsay Pacheco  Procedure(s) Performed: 25 GAUGE PARS PLANA VITRECTOMY WITH 20 GAUGE MVR PORT FOR MACULAR HOLE (Left ) Photocoagulation With Laser (Left Eye) Gas/Fluid Exchange (Left Eye)  Patient Location: PACU  Anesthesia Type:General  Level of Consciousness: awake, alert , oriented and sedated  Airway & Oxygen Therapy: Patient Spontanous Breathing and Patient connected to nasal cannula oxygen  Post-op Assessment: Report given to RN, Post -op Vital signs reviewed and stable and Patient moving all extremities  Post vital signs: Reviewed and stable  Last Vitals:  Vitals Value Taken Time  BP 123/52 07/03/18 1417  Temp 36.1 C 07/03/18 1417  Pulse 82 07/03/18 1424  Resp 15 07/03/18 1424  SpO2 98 % 07/03/18 1424  Vitals shown include unvalidated device data.  Last Pain:  Vitals:   07/03/18 1417  TempSrc:   PainSc: 0-No pain         Complications: No apparent anesthesia complications

## 2018-07-03 NOTE — Anesthesia Preprocedure Evaluation (Addendum)
Anesthesia Evaluation  Patient identified by MRN, date of birth, ID band Patient awake    Reviewed: Allergy & Precautions, NPO status , Patient's Chart, lab work & pertinent test results  History of Anesthesia Complications Negative for: history of anesthetic complications  Airway Mallampati: II  TM Distance: >3 FB Neck ROM: Full    Dental  (+) Dental Advisory Given   Pulmonary sleep apnea (does not use CPAP) ,  06/29/2018 SARS coronavirus NEG   breath sounds clear to auscultation       Cardiovascular hypertension, Pt. on medications (-) angina Rhythm:Regular Rate:Normal     Neuro/Psych negative neurological ROS     GI/Hepatic Neg liver ROS, N/v today   Endo/Other  diabetes (glu 170), Oral Hypoglycemic AgentsMorbid obesity  Renal/GU negative Renal ROS     Musculoskeletal  (+) Arthritis ,   Abdominal (+) + obese,   Peds  Hematology negative hematology ROS (+)   Anesthesia Other Findings Breast cancer  Reproductive/Obstetrics                            Anesthesia Physical Anesthesia Plan  ASA: III  Anesthesia Plan: General   Post-op Pain Management:    Induction: Intravenous and Rapid sequence  PONV Risk Score and Plan: 3 and Scopolamine patch - Pre-op, Dexamethasone and Ondansetron  Airway Management Planned: Oral ETT  Additional Equipment:   Intra-op Plan:   Post-operative Plan: Extubation in OR  Informed Consent: I have reviewed the patients History and Physical, chart, labs and discussed the procedure including the risks, benefits and alternatives for the proposed anesthesia with the patient or authorized representative who has indicated his/her understanding and acceptance.     Dental advisory given  Plan Discussed with: CRNA and Surgeon  Anesthesia Plan Comments:        Anesthesia Quick Evaluation

## 2018-07-04 ENCOUNTER — Encounter (HOSPITAL_COMMUNITY): Payer: Self-pay | Admitting: Ophthalmology

## 2018-07-04 DIAGNOSIS — H35342 Macular cyst, hole, or pseudohole, left eye: Secondary | ICD-10-CM | POA: Diagnosis not present

## 2018-07-04 LAB — GLUCOSE, CAPILLARY
Glucose-Capillary: 182 mg/dL — ABNORMAL HIGH (ref 70–99)
Glucose-Capillary: 214 mg/dL — ABNORMAL HIGH (ref 70–99)

## 2018-07-04 MED ORDER — BACITRACIN-POLYMYXIN B 500-10000 UNIT/GM OP OINT: 1.0000 "application " | TOPICAL_OINTMENT | Freq: Three times a day (TID) | OPHTHALMIC | 0 refills | Status: DC

## 2018-07-04 MED ORDER — PREDNISOLONE ACETATE 1 % OP SUSP: 1.0000 [drp] | Freq: Four times a day (QID) | OPHTHALMIC | 0 refills | Status: DC

## 2018-07-04 MED ORDER — BRIMONIDINE TARTRATE 0.2 % OP SOLN: 1.0000 [drp] | Freq: Two times a day (BID) | OPHTHALMIC | 12 refills | Status: DC

## 2018-07-04 MED ORDER — GATIFLOXACIN 0.5 % OP SOLN: 1.0000 [drp] | Freq: Four times a day (QID) | OPHTHALMIC | Status: DC

## 2018-07-04 NOTE — Plan of Care (Signed)
  Problem: Education: Goal: Required Educational Video(s) Outcome: Progressing   Problem: Clinical Measurements: Goal: Postoperative complications will be avoided or minimized Outcome: Progressing   Problem: Skin Integrity: Goal: Demonstration of wound healing without infection will improve Outcome: Progressing   

## 2018-07-04 NOTE — Plan of Care (Signed)
  Problem: Clinical Measurements: Goal: Postoperative complications will be avoided or minimized Outcome: Progressing   Problem: Skin Integrity: Goal: Demonstration of wound healing without infection will improve Outcome: Progressing   

## 2018-07-04 NOTE — Progress Notes (Signed)
07/04/2018, 6:20 AM  Mental Status:  Awake, Alert, Oriented  Anterior segment: Cornea  Clear    Anterior Chamber Clear    Lens:   Cataract  Intra Ocular Pressure 24 mmHg with Tonopen  Vitreous: Clear 95%gas bubble   Retina:  Attached Good laser reaction   Impression: Excellent result Retina attached   Final Diagnosis: Active Problems:   Macular hole, left eye   Plan: start post operative eye drops.  Add Alphagan BID.  Discharge to home.  Give post operative instructions  Lindsay Pacheco 07/04/2018, 6:20 AM

## 2018-07-04 NOTE — Discharge Summary (Signed)
Discharge summary not needed on [facility] patients per medical records. 

## 2018-07-10 ENCOUNTER — Encounter (INDEPENDENT_AMBULATORY_CARE_PROVIDER_SITE_OTHER): Payer: Medicare Other | Admitting: Ophthalmology

## 2018-07-10 ENCOUNTER — Other Ambulatory Visit: Payer: Self-pay

## 2018-07-10 DIAGNOSIS — H35342 Macular cyst, hole, or pseudohole, left eye: Secondary | ICD-10-CM

## 2018-07-27 ENCOUNTER — Other Ambulatory Visit: Payer: Self-pay

## 2018-07-27 ENCOUNTER — Encounter (INDEPENDENT_AMBULATORY_CARE_PROVIDER_SITE_OTHER): Payer: Medicare Other | Admitting: Ophthalmology

## 2018-07-27 DIAGNOSIS — H35342 Macular cyst, hole, or pseudohole, left eye: Secondary | ICD-10-CM

## 2018-10-08 ENCOUNTER — Other Ambulatory Visit: Payer: Self-pay

## 2018-10-08 ENCOUNTER — Encounter (INDEPENDENT_AMBULATORY_CARE_PROVIDER_SITE_OTHER): Payer: Medicare Other | Admitting: Ophthalmology

## 2018-10-08 DIAGNOSIS — H35342 Macular cyst, hole, or pseudohole, left eye: Secondary | ICD-10-CM

## 2018-10-08 DIAGNOSIS — H43813 Vitreous degeneration, bilateral: Secondary | ICD-10-CM

## 2018-10-08 DIAGNOSIS — H35372 Puckering of macula, left eye: Secondary | ICD-10-CM

## 2018-10-08 DIAGNOSIS — I10 Essential (primary) hypertension: Secondary | ICD-10-CM | POA: Diagnosis not present

## 2018-10-08 DIAGNOSIS — H2513 Age-related nuclear cataract, bilateral: Secondary | ICD-10-CM

## 2018-10-08 DIAGNOSIS — H35033 Hypertensive retinopathy, bilateral: Secondary | ICD-10-CM

## 2018-11-09 ENCOUNTER — Ambulatory Visit
Admission: RE | Admit: 2018-11-09 | Discharge: 2018-11-09 | Disposition: A | Discharge status: Home / Self Care | Payer: Medicare Other | Source: Ambulatory Visit | Attending: Adult Health | Admitting: Adult Health

## 2018-11-09 ENCOUNTER — Other Ambulatory Visit: Payer: Self-pay

## 2018-11-09 DIAGNOSIS — C50212 Malignant neoplasm of upper-inner quadrant of left female breast: Secondary | ICD-10-CM

## 2018-11-21 NOTE — Progress Notes (Signed)
[place]  [phone] [phone]     ID: Lindsay Pacheco DOB: 1960/05/17  MR#: [id number]  LNL#:[id number]  Patient Care Team: Monico Blitz, MD as PCP - General (Internal Medicine) Magrinat, Virgie Dad, MD as Consulting Physician (Oncology) Kyung Rudd, MD as Consulting Physician (Radiation Oncology) [name], MD as Consulting Physician (General Surgery) [name], MD as Consulting Physician (Gastroenterology) Norman Clay, MD as Consulting Physician (Psychiatry) Gaynelle Arabian, MD as Consulting Physician (Orthopedic Surgery) OTHER MD:  CHIEF COMPLAINT: Estrogen receptor positive breast cancer  CURRENT TREATMENT: Letrozole   INTERVAL HISTORY: Lindsay Pacheco returns today for follow up of her estrogen positive bresat cancer.    She continues on letrozole.  She is tolerating that moderately well.  She has not had significant problems with arthralgias or myalgias on this medication.  Since her last visit, she underwent bilateral diagnostic mammography with tomography at [facility] on 11/09/2018 showing: breast density category B; no evidence of malignancy in either breast.   She also underwent left eye surgery to repair a macular hole on 07/03/2018 under Dr. Zigmund Daniel.   REVIEW OF SYSTEMS: Lindsay Pacheco is taking appropriate precautions regarding the pandemic.  She is very concerned because she has gained quite a bit of weight.  She says not being able to get out and exercise is part of the problem.  Of course her knees are a problem as well.  She has seen Dr. Wynelle Link regarding this.  She is taking appropriate pandemic precautions.  A detailed review of systems today was otherwise stable   BREAST CANCER HISTORY: From the original intake note:  The patient had routine bilateral screening mammography 09/10/2015 showing apparent new right breast calcifications and an area of distortion and possible mass in the left breast. On 09/23/2015 she  underwent bilateral diagnostic mammography and left breast ultrasonography. The breast density was category C. The right breast calcification were felt to be benign. However in the left breast there was a persistent area of distortion in the superior subareolar areolar area, with 2 or 3 small masses in the upper inner quadrant. On exam there was an area of firm tissue just above the nipple of the left breast. Ultrasonography confirmed an area of shadowing at the 12:00 radiant 1 cm from the nipple and in addition 3 adjacent hypoechoic masses in the upper inner quadrant of the left breast, the largest measuring 1.1 cm and altogether measuring 2.5 cm. Ultrasound of the left axilla was benign  On 09/30/2015 the patient underwent cyst aspiration and also ultrasound-guided biopsy of 2 areas in the left breast, described as 11:00 and 12:00. The 12:00 area showed invasive ductal carcinoma, grade 1. The 11:00 biopsy showed ductal carcinoma in situ. The invasive tumor was estrogen receptor positive at greater than 90%, progesterone receptor positive at greater than 90%, both with strong staining intensity. HER-2 was equivocal by immunohistochemistry but negative by FISH with a signals ratio of 1.23 and the number per cell 2.0.  On 10/12/2015 the patient had a breast MRI showing no findings on the right, but on the left at the 12:00 region there was a spiculated enhancing mass measuring 2.2 cm. In the upper inner quadrant of the left breast there was an area of enhancement measuring 1.1 cm. In the lateral aspect of the breast was a benign-appearing intramammary lymph node and there were no abnormal appearing lymph nodes in either axilla.  With this information the patient proceeded to left seed bracketed lumpectomy and sentinel lymph  node sampling 11/02/2015. The final pathology ([id number]) confirmed invasive ductal carcinoma, grade 1, measuring 2.2 cm, with all 3 sentinel lymph nodes clear. Margins were clear. The  repeat prognostic panel again showed estrogen receptor 100% positivity, progesterone receptor 100% positivity, both with strong staining intensity, and no HER-2 amplification, the signals ratio being 1.06 and the number per cell 1.90.  Oncotype DX score of 18 predicted a 10 year risk of outside the breast recurrence of 12% if the patient's only systemic treatment was tamoxifen for 5 years. It also predicted no significant benefit from chemotherapy The patient then proceeded to adjuvant radiation completed 02/11/2016. She was started on letrozole 03/03/2016 by our survivorship nurse practitioner  Her subsequent history is as detailed below   PAST MEDICAL HISTORY: Past Medical History:  Diagnosis Date  . Anxiety   . Arthritis    "knees" (11/02/2015)  . Breast cancer ([place]) 2017   left breast   . Breast cancer, left breast ([place]) 09/30/2015  . Cataracts, bilateral    just watching right now  . Depression   . Diabetes mellitus, type II ([place])   . Diverticulitis 1987  . Hyperlipidemia   . Hypertension   . Irritable bowel syndrome (IBS)   . Sleep apnea    does not use CPAP but "I'm suppose to" (11/02/2015)  . SVD (spontaneous vaginal delivery)    x 1    PAST SURGICAL HISTORY: Past Surgical History:  Procedure Laterality Date  . [place] VITRECTOMY WITH 20 GAUGE MVR PORT FOR MACULAR HOLE Left 07/03/2018   Procedure: 25 GAUGE PARS PLANA VITRECTOMY WITH 20 GAUGE MVR PORT FOR MACULAR HOLE;  Surgeon: [name], MD;  Location: [facility];  Service: Ophthalmology;  Laterality: Left;  . BREAST BIOPSY Left 09/2015  . BREAST LUMPECTOMY Left 11/02/2015  . BREAST LUMPECTOMY WITH AXILLARY LYMPH NODE BIOPSY Left 11/02/2015   BREAST LUMPECTOMY WITH BRACKETED RADIOACTIVE SEED AND LEFT AXILLARY SENTINEL LYMPH NODE BIOPSY   . BREAST LUMPECTOMY WITH RADIOACTIVE SEED AND SENTINEL LYMPH NODE BIOPSY Left 11/02/2015   Procedure: LEFT BREAST LUMPECTOMY WITH BRACKETED RADIOACTIVE SEED AND LEFT  AXILLARY SENTINEL LYMPH NODE BIOPSY;  Surgeon: [name], MD;  Location: [facility];  Service: General;  Laterality: Left;  . COLONOSCOPY N/A 12/09/2015   Procedure: COLONOSCOPY;  Surgeon: [name], MD;  [facility];  Service: Endoscopy;  Laterality: N/A;  1:00  . EVACUATION BREAST HEMATOMA Left 11/02/2015  . EVACUATION BREAST HEMATOMA Left 11/02/2015   Procedure: EVACUATION HEMATOMA BREAST;  Surgeon: [name], MD;  Location: [facility];  Service: General;  Laterality: Left;  Marland Kitchen GAS/FLUID EXCHANGE Left 07/03/2018   Procedure: Gas/Fluid Exchange;  Surgeon: [name], MD;  Location: [facility];  Service: Ophthalmology;  Laterality: Left;  . JOINT REPLACEMENT    . KNEE ARTHROSCOPY Right 2000s  . LAPAROSCOPIC CHOLECYSTECTOMY  1995  . PHOTOCOAGULATION WITH LASER Left 07/03/2018   Procedure: Photocoagulation With Laser;  Surgeon: [name], MD;  Location: [facility];  Service: Ophthalmology;  Laterality: Left;  . POLYPECTOMY  12/09/2015   Procedure: POLYPECTOMY; colon polyp  Surgeon: [name], MD;  [facility];  Service: Endoscopy;;  multiple  . TOTAL KNEE ARTHROPLASTY Right 2011  . WISDOM TOOTH EXTRACTION      FAMILY HISTORY Family History  Problem Relation Age of Onset  . Anxiety disorder Father   . Depression Father   . Other Father  Abestosis  . Depression Sister   . Diabetes Mother   The patient's parents are still living, in their mid to late 41s. The patient has one brother and one sister. On the paternal side one uncle had spinal cord cancer and both paternal grandparents had lung cancer in the setting of tobacco abuse. The patient has one paternal aunt diagnosed with breast cancer in her 73s   GYNECOLOGIC HISTORY:  No LMP recorded (lmp unknown). Patient is postmenopausal. Menarche age 50, first live birth age 46, she is Lindsay Pacheco. She went through menopause in her early 3s. She never took  hormone replacement.   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  She is divorced. At home her mother has recently moved in with her. Daughter Benjie Karvonen half graduated from [employer] and is working as Architectural technologist for [employer] but lives locally. The patient is a Tourist information centre manager.    ADVANCED DIRECTIVES: Patient has the paperwork but has not yet completed it   HEALTH MAINTENANCE: Social History   Tobacco Use  . Smoking status: Never Smoker  . Smokeless tobacco: Never Used  Substance Use Topics  . Alcohol use: Not Currently    Comment: socially   . Drug use: Yes    Types: Marijuana    Comment: last used in 2018     Colonoscopy: 12/09/2015/[facility]  PAP:  Bone density: DEC 2017/ [facility]   Allergies  Allergen Reactions  . Adhesive [Tape] Rash    Not all adhesives   . Sulfa Antibiotics Nausea And Vomiting    Current Outpatient Medications  Medication Sig Dispense Refill  . acetaminophen (TYLENOL) 500 MG tablet Take 1,000 mg by mouth daily as needed for headache.    Marland Kitchen aspirin 81 MG tablet Take 81 mg by mouth daily.    Marland Kitchen atorvastatin (LIPITOR) 10 MG tablet Take 1 tablet (10 mg total) by mouth daily. For high cholesterol 30 tablet 0  . bacitracin-polymyxin b (POLYSPORIN) ophthalmic ointment Place 1 application into the left eye 3 (three) times daily. apply to eye every 12 hours while awake 3.5 g 0  . bismuth subsalicylate (PEPTO BISMOL) 262 MG/15ML suspension Take 30 mLs by mouth every 6 (six) hours as needed for indigestion or diarrhea or loose stools.    . brimonidine (ALPHAGAN) 0.2 % ophthalmic solution Place 1 drop into the left eye 2 (two) times daily. 5 mL 12  . calcium carbonate (TUMS - DOSED IN MG ELEMENTAL CALCIUM) 500 MG chewable tablet Chew 2 tablets by mouth daily as needed for indigestion or heartburn.    . cetirizine (ZYRTEC) 10 MG tablet Take 10 mg by mouth daily.    .  DULoxetine (CYMBALTA) 60 MG capsule Take 1 capsule (60 mg total) by mouth 2 (two) times daily. For mood control (Patient taking differently: Take 60 mg by mouth daily. For mood control) 60 capsule 0  . gabapentin (NEURONTIN) 300 MG capsule TAKE ONE CAPSULE BY MOUTH AT BEDTIME. (Patient taking differently: Take 300 mg by mouth at bedtime. ) 90 capsule 1  . gatifloxacin (ZYMAXID) 0.5 % SOLN Place 1 drop into the left eye 4 (four) times daily.    Marland Kitchen glimepiride (AMARYL) 2 MG tablet Take 2 mg by mouth daily with breakfast.    . HYDROcodone-acetaminophen (NORCO) 10-325 MG tablet Take 1 tablet by mouth every 6 (six) hours as needed for moderate pain.    Marland Kitchen letrozole [facility])  2.5 MG tablet TAKE ONE TABLET BY MOUTH DAILY (Patient taking differently: Take 2.5 mg by mouth daily. ) 90 tablet 3  . lisinopril (PRINIVIL,ZESTRIL) 20 MG tablet Take 1 tablet (20 mg total) by mouth daily. For high blood pressure (Patient not taking: Reported on 06/26/2018) 30 tablet 0  . lisinopril-hydrochlorothiazide (ZESTORETIC) 20-12.5 MG tablet Take 1 tablet by mouth daily.    . metFORMIN (GLUCOPHAGE) 500 MG tablet Take 1,000 mg by mouth 2 (two) times a day.    . prednisoLONE acetate (PRED FORTE) 1 % ophthalmic suspension Place 1 drop into the left eye 4 (four) times daily. 5 mL 0  . traZODone (DESYREL) 50 MG tablet Take 1 tablet (50 mg total) by mouth at bedtime as needed for sleep. 30 tablet 0   No current facility-administered medications for this visit.     OBJECTIVE: Morbidly obese white woman in no acute distress  Vitals:   11/22/18 1145  BP: (!) 131/99  Pulse: (!) 101  Resp: 18  Temp: 97.9 F (36.6 C)  SpO2: 97%     Body mass index is 44.46 kg/m.    ECOG FS:1 - Symptomatic but completely ambulatory  Sclerae unicteric, EOMs intact Wearing a mask No cervical or supraclavicular adenopathy Lungs no rales or rhonchi Heart regular rate and rhythm Abd soft, nontender, positive bowel sounds MSK no focal spinal  tenderness, no upper extremity lymphedema Neuro: nonfocal, well oriented, appropriate affect Breasts: The right breast is benign per the left breast is status post lumpectomy and radiation.  There is no evidence of local recurrence.  Both axillae are benign.   LAB RESULTS:  CMP     Component Value Date/Time   NA 139 11/22/2018 1116   NA 140 10/13/2016 1222   K 4.2 11/22/2018 1116   K 4.0 10/13/2016 1222   CL 102 11/22/2018 1116   CO2 26 11/22/2018 1116   CO2 27 10/13/2016 1222   GLUCOSE 201 (H) 11/22/2018 1116   GLUCOSE 234 (H) 10/13/2016 1222   BUN 12 11/22/2018 1116   BUN 12.5 10/13/2016 1222   CREATININE 0.83 11/22/2018 1116   CREATININE 0.9 10/13/2016 1222   CALCIUM 9.3 11/22/2018 1116   CALCIUM 10.0 10/13/2016 1222   PROT 7.3 11/22/2018 1116   PROT 7.5 10/13/2016 1222   ALBUMIN 3.8 11/22/2018 1116   ALBUMIN 3.7 10/13/2016 1222   AST 17 11/22/2018 1116   AST 17 10/13/2016 1222   ALT 19 11/22/2018 1116   ALT 15 10/13/2016 1222   ALKPHOS 145 (H) 11/22/2018 1116   ALKPHOS 139 10/13/2016 1222   BILITOT 0.6 11/22/2018 1116   BILITOT 0.67 10/13/2016 1222   GFRNONAA >60 11/22/2018 1116   GFRAA >60 11/22/2018 1116    No results found for: TOTALPROTELP, ALBUMINELP, A1GS, A2GS, BETS, BETA2SER, GAMS, MSPIKE, SPEI  No results found for: KPAFRELGTCHN, LAMBDASER, [facility]  Lab Results  Component Value Date   WBC 8.2 11/22/2018   NEUTROABS 4.6 11/22/2018   HGB 13.2 11/22/2018   HCT 41.3 11/22/2018   MCV 85.9 11/22/2018   PLT 315 11/22/2018      Chemistry      Component Value Date/Time   NA 139 11/22/2018 1116   NA 140 10/13/2016 1222   K 4.2 11/22/2018 1116   K 4.0 10/13/2016 1222   CL 102 11/22/2018 1116   CO2 26 11/22/2018 1116   CO2 27 10/13/2016 1222   BUN 12 11/22/2018 1116   BUN 12.5 10/13/2016 1222   CREATININE 0.83 11/22/2018 1116  CREATININE 0.9 10/13/2016 1222      Component Value Date/Time   CALCIUM 9.3 11/22/2018 1116   CALCIUM 10.0  10/13/2016 1222   ALKPHOS 145 (H) 11/22/2018 1116   ALKPHOS 139 10/13/2016 1222   AST 17 11/22/2018 1116   AST 17 10/13/2016 1222   ALT 19 11/22/2018 1116   ALT 15 10/13/2016 1222   BILITOT 0.6 11/22/2018 1116   BILITOT 0.67 10/13/2016 1222       No results found for: LABCA2  No components found for: [id number]  No results for input(s): INR in the last 168 hours.  Urinalysis    Component Value Date/Time   COLORURINE YELLOW 01/20/2010 1045   APPEARANCEUR CLEAR 01/20/2010 1045   LABSPEC 1.016 01/20/2010 1045   PHURINE 6.0 01/20/2010 1045   GLUCOSEU NEGATIVE 10/06/2009 1040   HGBUR NEGATIVE 01/20/2010 [place] 01/20/2010 [place] 01/20/2010 1045   PROTEINUR NEGATIVE 01/20/2010 1045   UROBILINOGEN 0.2 01/20/2010 1045   NITRITE NEGATIVE 01/20/2010 1045   LEUKOCYTESUR SMALL (A) 01/20/2010 1045     STUDIES: Mm Diag Breast Tomo Bilateral  Result Date: 11/09/2018 CLINICAL DATA:  History of left breast cancer status post lumpectomy in 2017. EXAM: DIGITAL DIAGNOSTIC BILATERAL MAMMOGRAM WITH CAD AND TOMO COMPARISON:  Previous exam(s). ACR Breast Density Category b: There are scattered areas of fibroglandular density. FINDINGS: Stable lumpectomy changes are seen in the left breast. No suspicious mass or malignant type microcalcifications identified in either breast. Mammographic images were processed with CAD. IMPRESSION: No evidence of malignancy in either breast. RECOMMENDATION: Bilateral diagnostic mammogram in 1 year is recommended. I have discussed the findings and recommendations with the patient. If applicable, a reminder letter will be sent to the patient regarding the next appointment. BI-RADS CATEGORY  2: Benign. Electronically Signed   By: Lillia Mountain M.D.   On: 11/09/2018 08:24     ASSESSMENT: 58 y.o. Lindsay Pacheco, Lindsay Pacheco woman status post left breast biopsy 2 (overlapping sites) 09/30/2015 for invasive ductal carcinoma, grade 1, estrogen receptor and  progesterone receptor strongly positive, HER-2 nonamplified.  (1) status post left lumpectomy 11/02/2015 for a pT2 pN0, stage IIA (2017 staging) invasive ductal carcinoma, again estrogen and progesterone receptor positive, HER-2 not amplified, with negative margins  (a) restaged as IB in the 2018 prognostic classification     (2) Oncotype score of 18 predicts a 10 year risk of recurrence outside the breast of 12% if the patient's only systemic therapy is tamoxifen for 5 years. It also predicts no significant benefit from chemotherapy  (3) Adjuvant radiation 12/22/15 - 02/11/16 Site/dose:    1) Left breast: 50.4 Gy in 28 fractions 2) Left breast boost: 10 Gy in 5 fractions  (4) letrozole started 03/03/2016  (a) D[facility] scan 12/07/2015 [facility] showed a T score of -0.8  PLAN: Xoe is now just over 3 years out from definitive surgery for breast cancer with no evidence of disease recurrence.  This is very favorable.  She is tolerating letrozole well and the plan is to continue that to a total of 5 years, which will take [place] through February 2023.  We discussed diet and exercise issues in detail today.  She is taking appropriate pandemic precautions  She will return to see me in 1 year.  She knows to call for any other issues that may develop before the next visit.   Virgie Dad. Magrinat, MD  11/23/18 11:22 AM Medical Oncology and Hematology [facility]  [place], [place] [address] Tel. [phone]    Fax. [phone]   I, Wilburn Mylar, am acting as scribe for Dr. Virgie Dad. Magrinat.  I, Lurline Del MD, have reviewed the above documentation for accuracy and completeness, and I agree with the above.

## 2018-11-22 ENCOUNTER — Inpatient Hospital Stay (HOSPITAL_BASED_OUTPATIENT_CLINIC_OR_DEPARTMENT_OTHER): Payer: Medicare Other | Admitting: Oncology

## 2018-11-22 ENCOUNTER — Other Ambulatory Visit: Payer: Self-pay

## 2018-11-22 ENCOUNTER — Inpatient Hospital Stay: Payer: Medicare Other | Attending: Oncology

## 2018-11-22 VITALS — BP 131/99 | HR 101 | Temp 97.9°F | Resp 18 | Ht 67.5 in | Wt 288.1 lb

## 2018-11-22 DIAGNOSIS — Z79899 Other long term (current) drug therapy: Secondary | ICD-10-CM | POA: Insufficient documentation

## 2018-11-22 DIAGNOSIS — C50212 Malignant neoplasm of upper-inner quadrant of left female breast: Secondary | ICD-10-CM | POA: Diagnosis not present

## 2018-11-22 DIAGNOSIS — I10 Essential (primary) hypertension: Secondary | ICD-10-CM | POA: Insufficient documentation

## 2018-11-22 DIAGNOSIS — Z170 Estrogen receptor positive status [ER+]: Secondary | ICD-10-CM

## 2018-11-22 DIAGNOSIS — Z79811 Long term (current) use of aromatase inhibitors: Secondary | ICD-10-CM | POA: Insufficient documentation

## 2018-11-22 DIAGNOSIS — C50919 Malignant neoplasm of unspecified site of unspecified female breast: Secondary | ICD-10-CM | POA: Insufficient documentation

## 2018-11-22 DIAGNOSIS — E119 Type 2 diabetes mellitus without complications: Secondary | ICD-10-CM | POA: Diagnosis not present

## 2018-11-22 LAB — CBC WITH DIFFERENTIAL/PLATELET
Abs Immature Granulocytes: 0.02 10*3/uL (ref 0.00–0.07)
Basophils Absolute: 0.2 10*3/uL — ABNORMAL HIGH (ref 0.0–0.1)
Basophils Relative: 2 %
Eosinophils Absolute: 0.7 10*3/uL — ABNORMAL HIGH (ref 0.0–0.5)
Eosinophils Relative: 8 %
HCT: 41.3 % (ref 36.0–46.0)
Hemoglobin: 13.2 g/dL (ref 12.0–15.0)
Immature Granulocytes: 0 %
Lymphocytes Relative: 28 %
Lymphs Abs: 2.3 10*3/uL (ref 0.7–4.0)
MCH: 27.4 pg (ref 26.0–34.0)
MCHC: 32 g/dL (ref 30.0–36.0)
MCV: 85.9 fL (ref 80.0–100.0)
Monocytes Absolute: 0.5 10*3/uL (ref 0.1–1.0)
Monocytes Relative: 6 %
Neutro Abs: 4.6 10*3/uL (ref 1.7–7.7)
Neutrophils Relative %: 56 %
Platelets: 315 10*3/uL (ref 150–400)
RBC: 4.81 MIL/uL (ref 3.87–5.11)
RDW: 13.9 % (ref 11.5–15.5)
WBC: 8.2 10*3/uL (ref 4.0–10.5)
nRBC: 0 % (ref 0.0–0.2)

## 2018-11-22 LAB — COMPREHENSIVE METABOLIC PANEL
ALT: 19 U/L (ref 0–44)
AST: 17 U/L (ref 15–41)
Albumin: 3.8 g/dL (ref 3.5–5.0)
Alkaline Phosphatase: 145 U/L — ABNORMAL HIGH (ref 38–126)
Anion gap: 11 (ref 5–15)
BUN: 12 mg/dL (ref 6–20)
CO2: 26 mmol/L (ref 22–32)
Calcium: 9.3 mg/dL (ref 8.9–10.3)
Chloride: 102 mmol/L (ref 98–111)
Creatinine, Ser: 0.83 mg/dL (ref 0.44–1.00)
GFR calc Af Amer: >60 mL/min (ref >60)
GFR calc non Af Amer: >60 mL/min (ref >60)
Glucose, Bld: 201 mg/dL — ABNORMAL HIGH (ref 70–99)
Potassium: 4.2 mmol/L (ref 3.5–5.1)
Sodium: 139 mmol/L (ref 135–145)
Total Bilirubin: 0.6 mg/dL (ref 0.3–1.2)
Total Protein: 7.3 g/dL (ref 6.5–8.1)

## 2018-11-27 ENCOUNTER — Encounter (INDEPENDENT_AMBULATORY_CARE_PROVIDER_SITE_OTHER): Payer: Self-pay | Admitting: *Deleted

## 2018-12-22 ENCOUNTER — Other Ambulatory Visit: Payer: Self-pay | Admitting: Oncology

## 2018-12-24 ENCOUNTER — Other Ambulatory Visit: Payer: Self-pay

## 2018-12-24 ENCOUNTER — Ambulatory Visit: Payer: Medicare Other | Attending: Internal Medicine

## 2018-12-24 DIAGNOSIS — Z20822 Contact with and (suspected) exposure to covid-19: Secondary | ICD-10-CM

## 2018-12-25 LAB — NOVEL CORONAVIRUS, NAA: SARS-CoV-2, NAA: NOT DETECTED

## 2019-02-08 ENCOUNTER — Encounter (INDEPENDENT_AMBULATORY_CARE_PROVIDER_SITE_OTHER): Payer: Medicare Other | Admitting: Ophthalmology

## 2019-02-08 ENCOUNTER — Other Ambulatory Visit: Payer: Self-pay

## 2019-02-08 DIAGNOSIS — H35033 Hypertensive retinopathy, bilateral: Secondary | ICD-10-CM

## 2019-02-08 DIAGNOSIS — I10 Essential (primary) hypertension: Secondary | ICD-10-CM | POA: Diagnosis not present

## 2019-02-08 DIAGNOSIS — H35342 Macular cyst, hole, or pseudohole, left eye: Secondary | ICD-10-CM | POA: Diagnosis not present

## 2019-02-08 DIAGNOSIS — H2513 Age-related nuclear cataract, bilateral: Secondary | ICD-10-CM | POA: Diagnosis not present

## 2019-02-08 DIAGNOSIS — H43813 Vitreous degeneration, bilateral: Secondary | ICD-10-CM | POA: Diagnosis not present

## 2019-02-19 ENCOUNTER — Telehealth (HOSPITAL_COMMUNITY): Payer: Self-pay | Admitting: Psychiatry

## 2019-02-19 NOTE — Telephone Encounter (Signed)
Therapist returned call to patient who requested names of therapists who may be able to help her daughter who has emotional and behavioral issues.  Therapist recommended Valora Piccolo and Christianne Dolin and provided patient with contact information.

## 2019-02-25 ENCOUNTER — Other Ambulatory Visit: Payer: Self-pay

## 2019-02-25 ENCOUNTER — Encounter: Payer: Self-pay | Admitting: Pulmonary Disease

## 2019-02-25 ENCOUNTER — Ambulatory Visit: Payer: Medicare Other | Admitting: Pulmonary Disease

## 2019-02-25 DIAGNOSIS — G4733 Obstructive sleep apnea (adult) (pediatric): Secondary | ICD-10-CM | POA: Diagnosis not present

## 2019-02-25 NOTE — Addendum Note (Signed)
Addended by: Jannette Spanner on: 02/25/2019 02:42 PM   Modules accepted: Orders

## 2019-02-25 NOTE — Patient Instructions (Signed)
Schedule home sleep test  This will be followed by a CPAP titration study at [facility] If for some reason, we cannot do titration study, we will set you up with an auto machine

## 2019-02-25 NOTE — Progress Notes (Signed)
Subjective:    Patient ID: Lindsay Pacheco, female    DOB: 1960-10-28, 59 y.o.   MRN: [id number]  HPI  Chief Complaint  Patient presents with  . Sleep Consult    59 year old diabetic presents to establish care for obstructive sleep apnea. She was diagnosed about 15 years ago by studies done at [facility].  She was started on CPAP machine initially with a full facemask and then with nasal pillows but could not tolerate the high pressures which would give her a headache, and stopped using her machine.   Since then she has been told by multiple providers that she has OSA including recently by her breast cancer surgeon and while undergoing therapy due to multiple episodes of witnessed apneas.  Her daughter has observed gasping and choking episodes during sleep.  She reports nonrefreshing sleep.  Epworth sleepiness score is 10, she denies any problems driving but reports sleepiness while watching TV, sitting inactive in a public place. Bedtime is between 11 and midnight, sleep latency is about 30 minutes, she sleeps on her back but will roll over onto her left side with 2 pillows.  She reports awakenings almost every hour including due to gasping episodes or for dryness of mouth and is out of bed latest by 9 AM feeling tired with headaches and dryness of mouth. She has gained 30 pounds over the last 2 years. She reports hot flashes and says that she is always feeling warm even in very cold weather  There is no history suggestive of cataplexy, sleep paralysis or parasomnias   She drinks alcohol socially and admits to using marijuana for anxiety. She is divorced and disabled   No bed partner history available-daughter has observed  Past Medical History:  Diagnosis Date  . Anxiety   . Arthritis    "knees" (11/02/2015)  . Breast cancer ([place]) 2017   left breast   . Breast cancer, left breast ([place]) 09/30/2015  . Cataracts, bilateral    just watching right now  . Depression   .  Diabetes mellitus, type II ([place])   . Diverticulitis 1987  . Hyperlipidemia   . Hypertension   . Irritable bowel syndrome (IBS)   . Sleep apnea    does not use CPAP but "I'm suppose to" (11/02/2015)  . SVD (spontaneous vaginal delivery)    x 1   Past Surgical History:  Procedure Laterality Date  . [place] VITRECTOMY WITH 20 GAUGE MVR PORT FOR MACULAR HOLE Left 07/03/2018   Procedure: 25 GAUGE PARS PLANA VITRECTOMY WITH 20 GAUGE MVR PORT FOR MACULAR HOLE;  Surgeon: [name], MD;  Location: [place];  Service: Ophthalmology;  Laterality: Left;  . BREAST BIOPSY Left 09/2015  . BREAST LUMPECTOMY Left 11/02/2015  . BREAST LUMPECTOMY WITH AXILLARY LYMPH NODE BIOPSY Left 11/02/2015   BREAST LUMPECTOMY WITH BRACKETED RADIOACTIVE SEED AND LEFT AXILLARY SENTINEL LYMPH NODE BIOPSY   . BREAST LUMPECTOMY WITH RADIOACTIVE SEED AND SENTINEL LYMPH NODE BIOPSY Left 11/02/2015   Procedure: LEFT BREAST LUMPECTOMY WITH BRACKETED RADIOACTIVE SEED AND LEFT AXILLARY SENTINEL LYMPH NODE BIOPSY;  Surgeon: [name], MD;  Location: [facility];  Service: General;  Laterality: Left;  . COLONOSCOPY N/A 12/09/2015   Procedure: COLONOSCOPY;  Surgeon: [name], MD;  [facility];  Service: Endoscopy;  Laterality: N/A;  1:00  . EVACUATION BREAST HEMATOMA Left 11/02/2015  . EVACUATION BREAST HEMATOMA Left 11/02/2015   Procedure: EVACUATION HEMATOMA BREAST;  Surgeon: Rodman Key  Donne Hazel, MD;  Location: [facility];  Service: General;  Laterality: Left;  Mar[place] Kitchen GAS/FLUID EXCHANGE Left 07/03/2018   Procedure: Gas/Fluid Exchange;  Surgeon: [name], MD;  Location: [facility];  Service: Ophthalmology;  Laterality: Left;  . JOINT REPLACEMENT    . KNEE ARTHROSCOPY Right 2000s  . LAPAROSCOPIC CHOLECYSTECTOMY  1995  . PHOTOCOAGULATION WITH LASER Left 07/03/2018   Procedure: Photocoagulation With Laser;  Surgeon: [name], MD;  Location: [facility];  Service:  Ophthalmology;  Laterality: Left;  . POLYPECTOMY  12/09/2015   Procedure: POLYPECTOMY; colon polyp  Surgeon: [name], MD;  [facility];  Service: Endoscopy;;  multiple  . TOTAL KNEE ARTHROPLASTY Right 2011  . WISDOM TOOTH EXTRACTION      Allergies  Allergen Reactions  . Adhesive [Tape] Rash    Not all adhesives   . Sulfa Antibiotics Nausea And Vomiting    Social History   Socioeconomic History  . Marital status: Divorced    Spouse name: Not on file  . Number of children: 1  . Years of education: Not on file  . Highest education level: Not on file  Occupational History  . Not on file  Tobacco Use  . Smoking status: Never Smoker  . Smokeless tobacco: Never Used  Substance and Sexual Activity  . Alcohol use: Not Currently    Comment: socially   . Drug use: Yes    Types: Marijuana    Comment: last used in 2018  . Sexual activity: Not Currently    Birth control/protection: Post-menopausal  Other Topics Concern  . Not on file  Social History Narrative  . Not on file   Social Determinants of Health   Financial Resource Strain:   . Difficulty of Paying Living Expenses: Not on file  Food Insecurity:   . Worried About Charity fundraiser in the Last Year: Not on file  . Ran Out of Food in the Last Year: Not on file  Transportation Needs:   . Lack of Transportation (Medical): Not on file  . Lack of Transportation (Non-Medical): Not on file  Physical Activity:   . Days of Exercise per Week: Not on file  . Minutes of Exercise per Session: Not on file  Stress:   . Feeling of Stress : Not on file  Social Connections:   . Frequency of Communication with Friends and Family: Not on file  . Frequency of Social Gatherings with Friends and Family: Not on file  . Attends Religious Services: Not on file  . Active Member of Clubs or Organizations: Not on file  . Attends Archivist Meetings: Not on file  . Marital Status: Not on file  Intimate Partner  Violence:   . Fear of Current or Ex-Partner: Not on file  . Emotionally Abused: Not on file  . Physically Abused: Not on file  . Sexually Abused: Not on file    Family History  Problem Relation Age of Onset  . Anxiety disorder Father   . Depression Father   . Other Father        Abestosis  . Depression Sister   . Diabetes Mother      Review of Systems  Constitutional: Positive for unexpected weight change. Negative for fever.  HENT: Negative for congestion, dental problem, ear pain, nosebleeds, postnasal drip, rhinorrhea, sinus pressure, sneezing, sore throat and trouble swallowing.   Eyes: Negative for redness and itching.  Respiratory: Positive for shortness of breath. Negative  for cough, chest tightness and wheezing.   Cardiovascular: Negative for palpitations and leg swelling.  Gastrointestinal: Negative for nausea and vomiting.  Genitourinary: Negative for dysuria.  Musculoskeletal: Positive for joint swelling.  Skin: Negative for rash.  Neurological: Negative for headaches.  Hematological: Does not bruise/bleed easily.  Psychiatric/Behavioral: Negative for dysphoric mood. The patient is nervous/anxious.        Objective:   Physical Exam  Gen. Pleasant, obese, in no distress, normal affect ENT - no pallor,icterus, no post nasal drip, class 2-3 airway Neck: No JVD, no thyromegaly, no carotid bruits Lungs: no use of accessory muscles, no dullness to percussion, decreased without rales or rhonchi  Cardiovascular: Rhythm regular, heart sounds  normal, no murmurs or gallops, no peripheral edema Abdomen: soft and non-tender, no hepatosplenomegaly, BS normal. Musculoskeletal: No deformities, no cyanosis or clubbing Neuro:  alert, non focal, no tremors       Assessment & Plan:

## 2019-02-25 NOTE — Assessment & Plan Note (Addendum)
Given excessive daytime somnolence, narrow pharyngeal exam, witnessed apneas & loud snoring, obstructive sleep apnea is very likely & an overnight polysomnogram will be scheduled as a home study. The pathophysiology of obstructive sleep apnea , it's cardiovascular consequences & modes of treatment including CPAP were discused with the patient in detail & they evidenced understanding.  Pretest probability is high  This will be followed by a CPAP titration study at [facility] -I would prefer an attended titration given her previous CPAP failure attempt If for some reason, we cannot do titration study, we will set you up with an auto machine with nasal mask

## 2019-02-28 DIAGNOSIS — H2512 Age-related nuclear cataract, left eye: Secondary | ICD-10-CM | POA: Diagnosis not present

## 2019-02-28 DIAGNOSIS — H2513 Age-related nuclear cataract, bilateral: Secondary | ICD-10-CM | POA: Diagnosis not present

## 2019-02-28 DIAGNOSIS — H35342 Macular cyst, hole, or pseudohole, left eye: Secondary | ICD-10-CM | POA: Diagnosis not present

## 2019-02-28 DIAGNOSIS — H25042 Posterior subcapsular polar age-related cataract, left eye: Secondary | ICD-10-CM | POA: Diagnosis not present

## 2019-02-28 DIAGNOSIS — H25013 Cortical age-related cataract, bilateral: Secondary | ICD-10-CM | POA: Diagnosis not present

## 2019-02-28 LAB — HM DIABETES EYE EXAM

## 2019-03-12 DIAGNOSIS — R809 Proteinuria, unspecified: Secondary | ICD-10-CM | POA: Diagnosis not present

## 2019-03-12 DIAGNOSIS — I10 Essential (primary) hypertension: Secondary | ICD-10-CM | POA: Diagnosis not present

## 2019-03-12 DIAGNOSIS — E1129 Type 2 diabetes mellitus with other diabetic kidney complication: Secondary | ICD-10-CM | POA: Diagnosis not present

## 2019-03-12 DIAGNOSIS — Z299 Encounter for prophylactic measures, unspecified: Secondary | ICD-10-CM | POA: Diagnosis not present

## 2019-03-12 DIAGNOSIS — E1165 Type 2 diabetes mellitus with hyperglycemia: Secondary | ICD-10-CM | POA: Diagnosis not present

## 2019-03-13 DIAGNOSIS — H25812 Combined forms of age-related cataract, left eye: Secondary | ICD-10-CM | POA: Diagnosis not present

## 2019-03-13 DIAGNOSIS — H2512 Age-related nuclear cataract, left eye: Secondary | ICD-10-CM | POA: Diagnosis not present

## 2019-03-25 ENCOUNTER — Ambulatory Visit: Payer: Medicare Other

## 2019-03-25 ENCOUNTER — Other Ambulatory Visit: Payer: Self-pay

## 2019-03-25 DIAGNOSIS — G4733 Obstructive sleep apnea (adult) (pediatric): Secondary | ICD-10-CM

## 2019-03-27 ENCOUNTER — Other Ambulatory Visit (HOSPITAL_BASED_OUTPATIENT_CLINIC_OR_DEPARTMENT_OTHER): Payer: Self-pay

## 2019-03-27 ENCOUNTER — Telehealth: Payer: Self-pay | Admitting: Pulmonary Disease

## 2019-03-27 DIAGNOSIS — G4733 Obstructive sleep apnea (adult) (pediatric): Secondary | ICD-10-CM | POA: Diagnosis not present

## 2019-03-27 NOTE — Telephone Encounter (Signed)
Home sleep test shows severe OSA, AHI 67/hour. Given her prior problems with CPAP, best approach would be to go through CPAP titration.  This would give her 1 night of attended study so that we can troubleshoot problems as they arise.  If she does not want this, alternatively we can proceed with auto CPAP prescription and have her use this at home

## 2019-03-27 NOTE — Telephone Encounter (Signed)
I have already given pt appt info.  Nothing further needed.

## 2019-03-27 NOTE — Telephone Encounter (Signed)
Spoke with patient. I explained to her what a cpap titration is and why it may be necessary in her situation. She has agreed to the cpap titration. Will let the PCCs know.

## 2019-04-09 ENCOUNTER — Other Ambulatory Visit: Payer: Self-pay

## 2019-04-09 ENCOUNTER — Other Ambulatory Visit (HOSPITAL_COMMUNITY)
Admission: RE | Admit: 2019-04-09 | Discharge: 2019-04-09 | Disposition: A | Discharge status: Home / Self Care | Payer: Medicare Other | Source: Ambulatory Visit | Attending: Pulmonary Disease | Admitting: Pulmonary Disease

## 2019-04-09 DIAGNOSIS — Z01812 Encounter for preprocedural laboratory examination: Secondary | ICD-10-CM | POA: Insufficient documentation

## 2019-04-09 DIAGNOSIS — Z20822 Contact with and (suspected) exposure to covid-19: Secondary | ICD-10-CM | POA: Insufficient documentation

## 2019-04-10 LAB — SARS CORONAVIRUS 2 (TAT 6-24 HRS): SARS Coronavirus 2: NEGATIVE

## 2019-04-11 ENCOUNTER — Other Ambulatory Visit: Payer: Self-pay

## 2019-04-11 ENCOUNTER — Ambulatory Visit: Payer: Medicare Other | Attending: Pulmonary Disease | Admitting: Pulmonary Disease

## 2019-04-11 DIAGNOSIS — G4733 Obstructive sleep apnea (adult) (pediatric): Secondary | ICD-10-CM | POA: Insufficient documentation

## 2019-04-18 DIAGNOSIS — H35033 Hypertensive retinopathy, bilateral: Secondary | ICD-10-CM | POA: Diagnosis not present

## 2019-04-18 DIAGNOSIS — H35342 Macular cyst, hole, or pseudohole, left eye: Secondary | ICD-10-CM | POA: Diagnosis not present

## 2019-04-23 ENCOUNTER — Telehealth: Payer: Self-pay | Admitting: Pulmonary Disease

## 2019-04-23 DIAGNOSIS — Z299 Encounter for prophylactic measures, unspecified: Secondary | ICD-10-CM | POA: Diagnosis not present

## 2019-04-23 DIAGNOSIS — G4733 Obstructive sleep apnea (adult) (pediatric): Secondary | ICD-10-CM | POA: Diagnosis not present

## 2019-04-23 DIAGNOSIS — G5790 Unspecified mononeuropathy of unspecified lower limb: Secondary | ICD-10-CM | POA: Diagnosis not present

## 2019-04-23 DIAGNOSIS — C50919 Malignant neoplasm of unspecified site of unspecified female breast: Secondary | ICD-10-CM | POA: Diagnosis not present

## 2019-04-23 DIAGNOSIS — E1165 Type 2 diabetes mellitus with hyperglycemia: Secondary | ICD-10-CM | POA: Diagnosis not present

## 2019-04-23 DIAGNOSIS — I10 Essential (primary) hypertension: Secondary | ICD-10-CM | POA: Diagnosis not present

## 2019-04-23 NOTE — Telephone Encounter (Signed)
Please send Rx for   Auto-BiPAP with PS 4 cm H2O, IPAP max 16 cm, EPAP min 8 cm   OV in 6 wks

## 2019-04-23 NOTE — Procedures (Signed)
Patient Name: Lindsay Pacheco, Lindsay Pacheco Date: 04/11/2019 Gender: Female D.O.B: 1960/06/24 Age (years): 30 Referring Provider: Kara Mead MD, ABSM Height (inches): 68 Interpreting Physician: Kara Mead MD, ABSM Weight (lbs): 283 RPSGT: Peak, Robert BMI: 44 MRN: [id number] Neck Size: 15.00 <br> <br> CLINICAL INFORMATION The patient is referred for a BiPAP titration to treat sleep apnea.    Date of  HST: 03/2019 severe OSA, AHI 67/hour  SLEEP STUDY TECHNIQUE As per the AASM Manual for the Scoring of Sleep and Associated Events v2.3 (April 2016) with a hypopnea requiring 4% desaturations.  The channels recorded and monitored were frontal, central and occipital EEG, electrooculogram (EOG), submentalis EMG (chin), nasal and oral airflow, thoracic and abdominal wall motion, anterior tibialis EMG, snore microphone, electrocardiogram, and pulse oximetry. Bilevel positive airway pressure (BPAP) was initiated at the beginning of the study and titrated to treat sleep-disordered breathing.  MEDICATIONS Medications self-administered by patient taken the night of the study : N/A  RESPIRATORY PARAMETERS Optimal IPAP Pressure (cm): 16 AHI at Optimal Pressure (/hr) 7.9 Optimal EPAP Pressure (cm): 12   Overall Minimal O2 (%): 79.0 Minimal O2 at Optimal Pressure (%): 85 SLEEP ARCHITECTURE Start Time: 10:24:06 PM Stop Time: 5:39:44 AM Total Time (min): 435.6 Total Sleep Time (min): 292.5 Sleep Latency (min): 102.6 Sleep Efficiency (%): 67.1% REM Latency (min): 49.5 WASO (min): 40.5 Stage N1 (%): 4.4% Stage N2 (%): 38.5% Stage N3 (%): 16.8% Stage R (%): 40.3 Supine (%): 20.34 Arousal Index (/hr): 11.9     CARDIAC DATA The 2 lead EKG demonstrated sinus rhythm. The mean heart rate was 40.2 beats per minute. Other EKG findings include: None.   LEG MOVEMENT DATA The total Periodic Limb Movements of Sleep (PLMS) were 0. The PLMS index was 0.0. A PLMS index of <15 is considered normal in  adults.  IMPRESSIONS - An optimal BiPAP pressure of 16/12 cm was selected for this patient based on the available study data. - Central sleep apnea was not noted during this titration (CAI = 1.0/h). - Severe oxygen desaturations were observed during this titration (min O2 = 79.0%). - No snoring was audible during this study. - No cardiac abnormalities were observed during this study. - Clinically significant periodic limb movements were not noted during this study. Arousals associated with PLMs were rare.   DIAGNOSIS - Obstructive Sleep Apnea (327.23 [G47.33 ICD-10])   RECOMMENDATIONS - Recommend a trial of Auto-BiPAP with PS 4 cm H2O, IPAP max 16 cm She did not tolerate CPAP due to pressure - Avoid alcohol, sedatives and other CNS depressants that may worsen sleep apnea and disrupt normal sleep architecture. - Sleep hygiene should be reviewed to assess factors that may improve sleep quality. - Weight management and regular exercise should be initiated or continued. - Return to [facility] for re-evaluation after 4 weeks of therapy   Kara Mead MD Board Certified in [place]

## 2019-04-25 NOTE — Telephone Encounter (Signed)
Pt called back-- please return call   [phone] anytime today

## 2019-04-25 NOTE — Telephone Encounter (Signed)
I left pt a detailed message letting her know I sent her order to have BIpap setting changed per Dr. Elsworth Soho. No need for her to call back.

## 2019-04-25 NOTE — Telephone Encounter (Signed)
Order placed.   ATC pt, no answer. Left message for pt to call back to let her know order was placed.

## 2019-04-26 ENCOUNTER — Telehealth: Payer: Self-pay | Admitting: Pulmonary Disease

## 2019-04-26 NOTE — Telephone Encounter (Signed)
Spoke with pt, answered several questions regarding her bipap to best of my ability.  Nothing further needed at this time- will close encounter.

## 2019-05-09 DIAGNOSIS — G4733 Obstructive sleep apnea (adult) (pediatric): Secondary | ICD-10-CM | POA: Diagnosis not present

## 2019-05-10 DIAGNOSIS — G4733 Obstructive sleep apnea (adult) (pediatric): Secondary | ICD-10-CM | POA: Diagnosis not present

## 2019-06-09 DIAGNOSIS — G4733 Obstructive sleep apnea (adult) (pediatric): Secondary | ICD-10-CM | POA: Diagnosis not present

## 2019-06-19 DIAGNOSIS — Z1211 Encounter for screening for malignant neoplasm of colon: Secondary | ICD-10-CM | POA: Diagnosis not present

## 2019-06-19 DIAGNOSIS — Z7189 Other specified counseling: Secondary | ICD-10-CM | POA: Diagnosis not present

## 2019-06-19 DIAGNOSIS — R5383 Other fatigue: Secondary | ICD-10-CM | POA: Diagnosis not present

## 2019-06-19 DIAGNOSIS — Z299 Encounter for prophylactic measures, unspecified: Secondary | ICD-10-CM | POA: Diagnosis not present

## 2019-06-19 DIAGNOSIS — Z0000 Encounter for general adult medical examination without abnormal findings: Secondary | ICD-10-CM | POA: Diagnosis not present

## 2019-06-19 DIAGNOSIS — I10 Essential (primary) hypertension: Secondary | ICD-10-CM | POA: Diagnosis not present

## 2019-06-19 DIAGNOSIS — E1165 Type 2 diabetes mellitus with hyperglycemia: Secondary | ICD-10-CM | POA: Diagnosis not present

## 2019-07-01 DIAGNOSIS — Z79899 Other long term (current) drug therapy: Secondary | ICD-10-CM | POA: Diagnosis not present

## 2019-07-04 ENCOUNTER — Other Ambulatory Visit: Payer: Self-pay | Admitting: Oncology

## 2019-07-04 DIAGNOSIS — C50212 Malignant neoplasm of upper-inner quadrant of left female breast: Secondary | ICD-10-CM

## 2019-07-09 ENCOUNTER — Telehealth: Payer: Self-pay | Admitting: Pulmonary Disease

## 2019-07-09 DIAGNOSIS — G4733 Obstructive sleep apnea (adult) (pediatric): Secondary | ICD-10-CM | POA: Diagnosis not present

## 2019-07-09 NOTE — Telephone Encounter (Signed)
Left patient a VM that I would get her Bipap issues to Dr. Elsworth Soho and follow up with her after we heard back from him. Dr. Elsworth Soho, Please see telephone note regarding problems with bipap for the last month and advise.  Thank you.

## 2019-07-10 NOTE — Telephone Encounter (Signed)
Called and spoke with pt letting her know the info stated by RA and she verbalized understanding. Order placed for bipap setting change and also scheduled pt an appt with RA 7/13 in [place]. Nothing further needed.

## 2019-07-10 NOTE — Telephone Encounter (Signed)
Change auto BiPAP settings. Drop EPAP minimum to 5 cm  -Please arrange for office visit  with APP/ me at [facility] to troubleshoot

## 2019-07-11 ENCOUNTER — Telehealth: Payer: Self-pay | Admitting: Pulmonary Disease

## 2019-07-11 NOTE — Telephone Encounter (Signed)
Order sent to [facility] 

## 2019-07-12 NOTE — Telephone Encounter (Signed)
Spoke with pt. She is aware that this has been changed. Nothing further was needed.

## 2019-07-16 ENCOUNTER — Other Ambulatory Visit: Payer: Self-pay

## 2019-07-16 ENCOUNTER — Encounter: Payer: Self-pay | Admitting: Pulmonary Disease

## 2019-07-16 ENCOUNTER — Ambulatory Visit: Payer: Medicare Other | Admitting: Pulmonary Disease

## 2019-07-16 DIAGNOSIS — I10 Essential (primary) hypertension: Secondary | ICD-10-CM | POA: Diagnosis not present

## 2019-07-16 DIAGNOSIS — G4733 Obstructive sleep apnea (adult) (pediatric): Secondary | ICD-10-CM

## 2019-07-16 NOTE — Patient Instructions (Addendum)
BiPAP pressure seems to be okay Trial of nasal mask.  Please call [phone] and make an appointment for mask fitting session

## 2019-07-16 NOTE — Assessment & Plan Note (Signed)
Cardiovascular implications of OSA were discussed

## 2019-07-16 NOTE — Assessment & Plan Note (Signed)
She is having adjustment issues with full facemask. We discussed using nasal mask, if she develops mild leak, she can trial a chinstrap.  Alternatively she can try different type of fullface mask.  I do feel that eventually we can get her comfortable and improve her compliance she is very close to the 4-hour mark BiPAP pressure seems to be adequate, average pressure 15/11 and events seem to be well controlled.  We discussed alternative therapies including oral appliance which would be inadequate for her and inspire for which she would need to decrease BMI to 32

## 2019-07-16 NOTE — Progress Notes (Signed)
   Subjective:    Patient ID: Lindsay Pacheco, female    DOB: August 31, 1960, 59 y.o.   MRN: [id number]  HPI  Chief Complaint  Patient presents with  . Follow-up    still waking up every 1-2 hours using BIPAP. She finds the mask and headgear uncomfortable.    59 year old for follow-up of severe OSA. She could not tolerate high pressure during CPAP titration study.  She was set up with bilevel.  She is trying to adjust to full facemask but seems to have leak from around the chin.  She is not sure what is waking her up, pressure seems to be okay She does report dry mouth  I reviewed BiPAP download which control of events on 15/11 average pressure with minimal leak  She has called DME numerous times and seems to be well supported there.  She would also like to discuss alternative therapies for OSA   Significant tests/ events reviewed  HST 03/2019 severe OSA, AHI 67/h  Titration 04/2019 >>did not tolerate CPAP due to pressure, optimal16/12  Review of Systems Patient denies significant dyspnea,cough, hemoptysis,  chest pain, palpitations, pedal edema, orthopnea, paroxysmal nocturnal dyspnea, lightheadedness, nausea, vomiting, abdominal or  leg pains      Objective:   Physical Exam  Gen. Pleasant, obese, in no distress ENT - no lesions, no post nasal drip Neck: No JVD, no thyromegaly, no carotid bruits Lungs: no use of accessory muscles, no dullness to percussion, decreased without rales or rhonchi  Cardiovascular: Rhythm regular, heart sounds  normal, no murmurs or gallops, no peripheral edema Musculoskeletal: No deformities, no cyanosis or clubbing , no tremors       Assessment & Plan:

## 2019-07-23 ENCOUNTER — Telehealth: Payer: Self-pay | Admitting: Pulmonary Disease

## 2019-07-23 DIAGNOSIS — G4733 Obstructive sleep apnea (adult) (pediatric): Secondary | ICD-10-CM

## 2019-07-23 NOTE — Telephone Encounter (Signed)
Order has been placed for mask fit. Routing to [place] as an Pharmacist, hospital.

## 2019-08-02 DIAGNOSIS — E785 Hyperlipidemia, unspecified: Secondary | ICD-10-CM | POA: Diagnosis not present

## 2019-08-02 DIAGNOSIS — E1165 Type 2 diabetes mellitus with hyperglycemia: Secondary | ICD-10-CM | POA: Diagnosis not present

## 2019-08-02 DIAGNOSIS — I10 Essential (primary) hypertension: Secondary | ICD-10-CM | POA: Diagnosis not present

## 2019-08-05 ENCOUNTER — Ambulatory Visit (HOSPITAL_BASED_OUTPATIENT_CLINIC_OR_DEPARTMENT_OTHER): Payer: Medicare Other | Attending: Pulmonary Disease | Admitting: Internal Medicine

## 2019-08-05 DIAGNOSIS — G4733 Obstructive sleep apnea (adult) (pediatric): Secondary | ICD-10-CM

## 2019-08-06 ENCOUNTER — Other Ambulatory Visit: Payer: Self-pay

## 2019-08-09 ENCOUNTER — Ambulatory Visit: Payer: Medicare Other | Admitting: Neurology

## 2019-08-09 DIAGNOSIS — G4733 Obstructive sleep apnea (adult) (pediatric): Secondary | ICD-10-CM | POA: Diagnosis not present

## 2019-08-13 DIAGNOSIS — D485 Neoplasm of uncertain behavior of skin: Secondary | ICD-10-CM | POA: Diagnosis not present

## 2019-08-13 DIAGNOSIS — D225 Melanocytic nevi of trunk: Secondary | ICD-10-CM | POA: Diagnosis not present

## 2019-08-13 DIAGNOSIS — Z1283 Encounter for screening for malignant neoplasm of skin: Secondary | ICD-10-CM | POA: Diagnosis not present

## 2019-08-13 DIAGNOSIS — L218 Other seborrheic dermatitis: Secondary | ICD-10-CM | POA: Diagnosis not present

## 2019-08-14 DIAGNOSIS — G4733 Obstructive sleep apnea (adult) (pediatric): Secondary | ICD-10-CM | POA: Diagnosis not present

## 2019-08-16 DIAGNOSIS — H20042 Secondary noninfectious iridocyclitis, left eye: Secondary | ICD-10-CM | POA: Diagnosis not present

## 2019-08-23 DIAGNOSIS — C50919 Malignant neoplasm of unspecified site of unspecified female breast: Secondary | ICD-10-CM | POA: Diagnosis not present

## 2019-08-23 DIAGNOSIS — E1165 Type 2 diabetes mellitus with hyperglycemia: Secondary | ICD-10-CM | POA: Diagnosis not present

## 2019-08-23 DIAGNOSIS — J069 Acute upper respiratory infection, unspecified: Secondary | ICD-10-CM | POA: Diagnosis not present

## 2019-08-23 DIAGNOSIS — Z299 Encounter for prophylactic measures, unspecified: Secondary | ICD-10-CM | POA: Diagnosis not present

## 2019-08-23 DIAGNOSIS — E119 Type 2 diabetes mellitus without complications: Secondary | ICD-10-CM | POA: Diagnosis not present

## 2019-08-26 DIAGNOSIS — J069 Acute upper respiratory infection, unspecified: Secondary | ICD-10-CM | POA: Diagnosis not present

## 2019-08-26 DIAGNOSIS — Z299 Encounter for prophylactic measures, unspecified: Secondary | ICD-10-CM | POA: Diagnosis not present

## 2019-08-26 DIAGNOSIS — I10 Essential (primary) hypertension: Secondary | ICD-10-CM | POA: Diagnosis not present

## 2019-08-28 DIAGNOSIS — R05 Cough: Secondary | ICD-10-CM | POA: Diagnosis not present

## 2019-08-30 DIAGNOSIS — E119 Type 2 diabetes mellitus without complications: Secondary | ICD-10-CM | POA: Diagnosis not present

## 2019-08-30 DIAGNOSIS — Z299 Encounter for prophylactic measures, unspecified: Secondary | ICD-10-CM | POA: Diagnosis not present

## 2019-08-30 DIAGNOSIS — G4733 Obstructive sleep apnea (adult) (pediatric): Secondary | ICD-10-CM | POA: Diagnosis not present

## 2019-08-30 DIAGNOSIS — J069 Acute upper respiratory infection, unspecified: Secondary | ICD-10-CM | POA: Diagnosis not present

## 2019-08-30 DIAGNOSIS — I10 Essential (primary) hypertension: Secondary | ICD-10-CM | POA: Diagnosis not present

## 2019-09-09 DIAGNOSIS — G4733 Obstructive sleep apnea (adult) (pediatric): Secondary | ICD-10-CM | POA: Diagnosis not present

## 2019-09-18 DIAGNOSIS — Z299 Encounter for prophylactic measures, unspecified: Secondary | ICD-10-CM | POA: Diagnosis not present

## 2019-09-18 DIAGNOSIS — E1165 Type 2 diabetes mellitus with hyperglycemia: Secondary | ICD-10-CM | POA: Diagnosis not present

## 2019-09-18 DIAGNOSIS — I10 Essential (primary) hypertension: Secondary | ICD-10-CM | POA: Diagnosis not present

## 2019-09-18 DIAGNOSIS — Z789 Other specified health status: Secondary | ICD-10-CM | POA: Diagnosis not present

## 2019-09-18 DIAGNOSIS — R197 Diarrhea, unspecified: Secondary | ICD-10-CM | POA: Diagnosis not present

## 2019-09-19 DIAGNOSIS — H20042 Secondary noninfectious iridocyclitis, left eye: Secondary | ICD-10-CM | POA: Diagnosis not present

## 2019-09-25 DIAGNOSIS — G4733 Obstructive sleep apnea (adult) (pediatric): Secondary | ICD-10-CM | POA: Diagnosis not present

## 2019-09-26 DIAGNOSIS — I10 Essential (primary) hypertension: Secondary | ICD-10-CM | POA: Diagnosis not present

## 2019-09-26 DIAGNOSIS — Z299 Encounter for prophylactic measures, unspecified: Secondary | ICD-10-CM | POA: Diagnosis not present

## 2019-09-26 DIAGNOSIS — E1165 Type 2 diabetes mellitus with hyperglycemia: Secondary | ICD-10-CM | POA: Diagnosis not present

## 2019-09-26 DIAGNOSIS — C50919 Malignant neoplasm of unspecified site of unspecified female breast: Secondary | ICD-10-CM | POA: Diagnosis not present

## 2019-09-26 DIAGNOSIS — G4733 Obstructive sleep apnea (adult) (pediatric): Secondary | ICD-10-CM | POA: Diagnosis not present

## 2019-10-07 ENCOUNTER — Other Ambulatory Visit: Payer: Self-pay | Admitting: Oncology

## 2019-10-07 DIAGNOSIS — Z9889 Other specified postprocedural states: Secondary | ICD-10-CM

## 2019-10-07 DIAGNOSIS — Z853 Personal history of malignant neoplasm of breast: Secondary | ICD-10-CM

## 2019-10-09 DIAGNOSIS — G4733 Obstructive sleep apnea (adult) (pediatric): Secondary | ICD-10-CM | POA: Diagnosis not present

## 2019-10-15 ENCOUNTER — Encounter: Payer: Self-pay | Admitting: Neurology

## 2019-10-15 ENCOUNTER — Ambulatory Visit: Payer: Medicare Other | Admitting: Neurology

## 2019-10-15 ENCOUNTER — Telehealth: Payer: Self-pay | Admitting: Neurology

## 2019-10-15 VITALS — BP 120/84 | HR 84 | Ht 67.0 in | Wt 277.0 lb

## 2019-10-15 DIAGNOSIS — M5431 Sciatica, right side: Secondary | ICD-10-CM | POA: Diagnosis not present

## 2019-10-15 DIAGNOSIS — M5432 Sciatica, left side: Secondary | ICD-10-CM

## 2019-10-15 MED ORDER — GABAPENTIN 300 MG PO CAPS: ORAL_CAPSULE | ORAL | 1 refills | Status: DC

## 2019-10-15 NOTE — Progress Notes (Signed)
Reason for visit: Back pain, bilateral sciatica  Referring physician: Dr. Flossie Dibble is a 59 y.o. female  History of present illness:  Lindsay Pacheco is a 59 year old right-handed white female with a history of obesity, diabetes, sleep apnea, irritable bowel syndrome, and degenerative arthritis of the knees.  She has had a prior right total knee replacement and has been told that she needs a similar procedure on the left.  She has had a 3-year history of some back pain and pain down both legs.  The patient has been seen through [facility], Dr. Wynelle Link is her surgeon for her knees, she was seen by Dr. Nelva Bush for her back problem.  The patient has undergone epidural injection in her back a year or year and a half ago which offered good improvement of the pain down the legs.  The patient never had MRI of the lumbar spine done.  The patient has some numbness of the big toe on the right foot, not on the left.  She has discomfort primarily in the knees down to the ankles in the evening hours, she has difficulty sleeping because of this.  She is on Cymbalta taking 60 mg twice daily and gabapentin 300 mg at night.  The patient feels more discomfort when she is up on her feet, she feels better with sitting or lying down with her legs propped up.  She believes that there is some mild weakness in the legs, she has not had any falls but does have some mild gait instability.  She denies issues controlling the bowels or the bladder.  She does have some occasional neck discomfort without pain down the arms on either side.  Her hemoglobin A1c tends to run around 7.1.  She indicates that she needs to lose weight prior to her knee surgery.  She is sent to this office for further evaluation.  Past Medical History:  Diagnosis Date  . Anxiety   . Arthritis    "knees" (11/02/2015)  . Breast cancer ([place]) 2017   left breast   . Breast cancer, left breast ([place]) 09/30/2015  . Cataracts, bilateral     just watching right now  . Depression   . Diabetes mellitus, type II ([place])   . Diverticulitis 1987  . Hyperlipidemia   . Hypertension   . Irritable bowel syndrome (IBS)   . Sleep apnea    does not use CPAP but "I'm suppose to" (11/02/2015)  . SVD (spontaneous vaginal delivery)    x 1    Past Surgical History:  Procedure Laterality Date  . [place] VITRECTOMY WITH 20 GAUGE MVR PORT FOR MACULAR HOLE Left 07/03/2018   Procedure: 25 GAUGE PARS PLANA VITRECTOMY WITH 20 GAUGE MVR PORT FOR MACULAR HOLE;  Surgeon: [name], MD;  Location: [place];  Service: Ophthalmology;  Laterality: Left;  . BREAST BIOPSY Left 09/2015  . BREAST LUMPECTOMY Left 11/02/2015  . BREAST LUMPECTOMY WITH AXILLARY LYMPH NODE BIOPSY Left 11/02/2015   BREAST LUMPECTOMY WITH BRACKETED RADIOACTIVE SEED AND LEFT AXILLARY SENTINEL LYMPH NODE BIOPSY   . BREAST LUMPECTOMY WITH RADIOACTIVE SEED AND SENTINEL LYMPH NODE BIOPSY Left 11/02/2015   Procedure: LEFT BREAST LUMPECTOMY WITH BRACKETED RADIOACTIVE SEED AND LEFT AXILLARY SENTINEL LYMPH NODE BIOPSY;  Surgeon: [name], MD;  Location: [facility];  Service: General;  Laterality: Left;  . COLONOSCOPY N/A 12/09/2015   Procedure: COLONOSCOPY;  Surgeon: [name], MD;  [facility];  Service: Endoscopy;  Laterality: N/A;  1:00  . EVACUATION BREAST HEMATOMA Left 11/02/2015  . EVACUATION BREAST HEMATOMA Left 11/02/2015   Procedure: EVACUATION HEMATOMA BREAST;  Surgeon: [name], MD;  Location: [facility];  Service: General;  Laterality: Left;  Marland Kitchen GAS/FLUID EXCHANGE Left 07/03/2018   Procedure: Gas/Fluid Exchange;  Surgeon: [name], MD;  Location: [facility];  Service: Ophthalmology;  Laterality: Left;  . JOINT REPLACEMENT    . KNEE ARTHROSCOPY Right 2000s  . LAPAROSCOPIC CHOLECYSTECTOMY  1995  . PHOTOCOAGULATION WITH LASER Left 07/03/2018   Procedure: Photocoagulation With Laser;  Surgeon:  [name], MD;  Location: [facility];  Service: Ophthalmology;  Laterality: Left;  . POLYPECTOMY  12/09/2015   Procedure: POLYPECTOMY; colon polyp  Surgeon: [name], MD;  [facility];  Service: Endoscopy;;  multiple  . TOTAL KNEE ARTHROPLASTY Right 2011  . WISDOM TOOTH EXTRACTION      Family History  Problem Relation Age of Onset  . Anxiety disorder Father   . Depression Father   . Other Father        Abestosis  . Depression Sister   . Diabetes Mother     Social history:  reports that she has never smoked. She has never used smokeless tobacco. She reports previous alcohol use. She reports current drug use. Drug: Marijuana.  Medications:  Prior to Admission medications   Medication Sig Start Date End Date Taking? Authorizing Provider  acetaminophen (TYLENOL) 500 MG tablet Take 1,000 mg by mouth daily as needed for headache.    [provider]  aspirin 81 MG tablet Take 81 mg by mouth daily.    [provider]  atorvastatin (LIPITOR) 10 MG tablet Take 1 tablet (10 mg total) by mouth daily. For high cholesterol 06/08/17   Money, Lowry Ram, FNP  bismuth subsalicylate (PEPTO BISMOL) 262 MG/15ML suspension Take 30 mLs by mouth every 6 (six) hours as needed for indigestion or diarrhea or loose stools.    [provider]  calcium carbonate (TUMS - DOSED IN MG ELEMENTAL CALCIUM) 500 MG chewable tablet Chew 2 tablets by mouth daily as needed for indigestion or heartburn.    [provider]  cetirizine (ZYRTEC) 10 MG tablet Take 10 mg by mouth daily.    [provider]  DULoxetine (CYMBALTA) 60 MG capsule Take 1 capsule (60 mg total) by mouth 2 (two) times daily. For mood control Patient taking differently: Take 60 mg by mouth daily. For mood control 02/23/18   Norman Clay, MD  gabapentin (NEURONTIN) 300 MG capsule TAKE ONE CAPSULE BY MOUTH AT BEDTIME. 12/24/18   Magrinat, Virgie Dad, MD  glimepiride (AMARYL) 2 MG tablet Take 2 mg by  mouth daily with breakfast.    [provider]  HYDROcodone-acetaminophen (NORCO) 10-325 MG tablet Take 1 tablet by mouth every 6 (six) hours as needed for moderate pain.    [provider]  letrozole [facility]) 2.5 MG tablet TAKE ONE TABLET BY MOUTH EVERY DAY 07/05/19   Magrinat, Virgie Dad, MD  lisinopril (PRINIVIL,ZESTRIL) 20 MG tablet Take 1 tablet (20 mg total) by mouth daily. For high blood pressure 06/08/17   Money, Lowry Ram, FNP  lisinopril-hydrochlorothiazide (ZESTORETIC) 20-12.5 MG tablet Take 1 tablet by mouth daily.    [provider]  metFORMIN (GLUCOPHAGE) 500 MG tablet Take 1,000 mg by mouth 2 (two) times a day.    [provider]  traZODone (DESYREL) 50 MG tablet Take 1 tablet (50  mg total) by mouth at bedtime as needed for sleep. 01/30/18   Norman Clay, MD      Allergies  Allergen Reactions  . Adhesive [Tape] Rash    Not all adhesives   . Sulfa Antibiotics Nausea And Vomiting    ROS:  Out of a complete 14 system review of symptoms, the patient complains only of the following symptoms, and all other reviewed systems are negative.  Knee discomfort Back pain, leg pain  Blood pressure 120/84, pulse 84, height 5\' 7"  (1.702 m), weight 277 lb (125.6 kg).  Physical Exam  General: The patient is alert and cooperative at the time of the examination.  The patient is markedly obese.  Eyes: Pupils are equal, round, and reactive to light. Discs are flat bilaterally.  Neck: The neck is supple, no carotid bruits are noted.  Respiratory: The respiratory examination is clear.  Cardiovascular: The cardiovascular examination reveals a regular rate and rhythm, no obvious murmurs or rubs are noted.  Skin: Extremities are without significant edema.  Neurologic Exam  Mental status: The patient is alert and oriented x 3 at the time of the examination. The patient has apparent normal recent and remote memory, with an apparently normal attention span and  concentration ability.  Cranial nerves: Facial symmetry is present. There is good sensation of the face to pinprick and soft touch bilaterally. The strength of the facial muscles and the muscles to head turning and shoulder shrug are normal bilaterally. Speech is well enunciated, no aphasia or dysarthria is noted. Extraocular movements are full. Visual fields are full. The tongue is midline, and the patient has symmetric elevation of the soft palate. No obvious hearing deficits are noted.  Motor: The motor testing reveals 5 over 5 strength of all 4 extremities. Good symmetric motor tone is noted throughout.  Sensory: Sensory testing is intact to pinprick, soft touch, vibration sensation, and position sense on all 4 extremities.  The patient does have a sensory deficit across the ankles bilaterally to pinprick.  No evidence of extinction is noted.  Coordination: Cerebellar testing reveals good finger-nose-finger and heel-to-shin bilaterally.  Gait and station: Gait is normal. Tandem gait is slightly unsteady.  Romberg is negative. No drift is seen.  The patient is able to walk on heels and the toes bilaterally.  Reflexes: Deep tendon reflexes are symmetric and normal bilaterally.  The ankle jerk reflexes are well-maintained bilaterally.  Toes are downgoing bilaterally.   Assessment/Plan:  1.  Chronic low back pain, bilateral sciatica  2.  Degenerative arthritis, left knee  The patient likely does not have a significant diabetic peripheral neuropathy.  She does have some low back pain and pain down both legs, with some more discomfort and numbness in the right foot as compared to the left.  The patient has benefited from an epidural steroid injection in the past.  The patient will be set up for MRI of the lumbar spine, she will be set up for nerve conductions on both legs and EMG on the right leg.  The gabapentin dose will be increased to 300 mg in the morning and 600 mg in the evening.  She will  follow-up here otherwise in about 4 months.  Jill Alexanders MD 10/15/2019 8:08 AM  [facility] [address] [place], [place] [address]  Phone [phone] Fax [phone]

## 2019-10-15 NOTE — Telephone Encounter (Signed)
[employer] medicare order sent to GI. No auth they will reach out to the patient to schedule.  

## 2019-10-15 NOTE — Patient Instructions (Signed)
We will go up on the gabapentin to 300 mg in the morning and 600 mg in the evening.  Neurontin (gabapentin) may result in drowsiness, ankle swelling, gait instability, or possibly dizziness. Please contact our office if significant side effects occur with this medication.

## 2019-10-20 ENCOUNTER — Inpatient Hospital Stay: Admission: RE | Admit: 2019-10-20 | Payer: Medicare Other | Source: Ambulatory Visit

## 2019-10-22 DIAGNOSIS — H2511 Age-related nuclear cataract, right eye: Secondary | ICD-10-CM | POA: Diagnosis not present

## 2019-10-22 DIAGNOSIS — H524 Presbyopia: Secondary | ICD-10-CM | POA: Diagnosis not present

## 2019-10-22 DIAGNOSIS — E119 Type 2 diabetes mellitus without complications: Secondary | ICD-10-CM | POA: Diagnosis not present

## 2019-10-22 DIAGNOSIS — H25011 Cortical age-related cataract, right eye: Secondary | ICD-10-CM | POA: Diagnosis not present

## 2019-10-22 DIAGNOSIS — H35342 Macular cyst, hole, or pseudohole, left eye: Secondary | ICD-10-CM | POA: Diagnosis not present

## 2019-10-25 DIAGNOSIS — L258 Unspecified contact dermatitis due to other agents: Secondary | ICD-10-CM | POA: Diagnosis not present

## 2019-10-28 ENCOUNTER — Ambulatory Visit: Payer: Self-pay | Admitting: Pulmonary Disease

## 2019-11-05 ENCOUNTER — Other Ambulatory Visit (INDEPENDENT_AMBULATORY_CARE_PROVIDER_SITE_OTHER): Payer: Self-pay | Admitting: *Deleted

## 2019-11-09 DIAGNOSIS — G4733 Obstructive sleep apnea (adult) (pediatric): Secondary | ICD-10-CM | POA: Diagnosis not present

## 2019-11-11 ENCOUNTER — Ambulatory Visit
Admission: RE | Admit: 2019-11-11 | Discharge: 2019-11-11 | Disposition: A | Discharge status: Home / Self Care | Payer: Medicare Other | Source: Ambulatory Visit | Attending: Oncology | Admitting: Oncology

## 2019-11-11 ENCOUNTER — Encounter: Payer: Self-pay | Admitting: Neurology

## 2019-11-11 ENCOUNTER — Ambulatory Visit: Payer: Medicare Other | Admitting: Neurology

## 2019-11-11 ENCOUNTER — Ambulatory Visit (INDEPENDENT_AMBULATORY_CARE_PROVIDER_SITE_OTHER): Payer: Medicare Other | Admitting: Neurology

## 2019-11-11 ENCOUNTER — Other Ambulatory Visit: Payer: Self-pay

## 2019-11-11 DIAGNOSIS — E119 Type 2 diabetes mellitus without complications: Secondary | ICD-10-CM

## 2019-11-11 DIAGNOSIS — Z853 Personal history of malignant neoplasm of breast: Secondary | ICD-10-CM | POA: Diagnosis not present

## 2019-11-11 DIAGNOSIS — M5431 Sciatica, right side: Secondary | ICD-10-CM

## 2019-11-11 DIAGNOSIS — Z9889 Other specified postprocedural states: Secondary | ICD-10-CM

## 2019-11-11 DIAGNOSIS — M5432 Sciatica, left side: Secondary | ICD-10-CM

## 2019-11-11 HISTORY — DX: Personal history of irradiation: Z92.3

## 2019-11-11 NOTE — Procedures (Signed)
     HISTORY:  Lindsay Pacheco is a 59 year old patient with a history of back pain and right leg discomfort down to the big toe, on the left she has knee pain and some numbness in the left thigh.  The patient is being evaluated for a possible lumbosacral radiculopathy.  NERVE CONDUCTION STUDIES:  Nerve conduction studies were performed on both lower extremities. The distal motor latencies and motor amplitudes for the peroneal and posterior tibial nerves were within normal limits. The nerve conduction velocities for these nerves were also normal. The sensory latencies for the peroneal and sural nerves were within normal limits. The F wave latencies for the posterior tibial nerves were within normal limits.   EMG STUDIES:  EMG study was performed on the right lower extremity:  The tibialis anterior muscle reveals 2 to 4K motor units with full recruitment. No fibrillations or positive waves were seen. The peroneus tertius muscle reveals 2 to 4K motor units with full recruitment. No fibrillations or positive waves were seen. The medial gastrocnemius muscle reveals 1 to 3K motor units with full recruitment. 2+ positive waves were seen. The vastus lateralis muscle reveals 2 to 4K motor units with full recruitment. No fibrillations or positive waves were seen. The iliopsoas muscle reveals 2 to 4K motor units with full recruitment. No fibrillations or positive waves were seen. The biceps femoris muscle (long head) reveals 2 to 4K motor units with full recruitment. No fibrillations or positive waves were seen. The lumbosacral paraspinal muscles were tested at 3 levels, and revealed no abnormalities of insertional activity at all 3 levels tested. There was good relaxation.   IMPRESSION:  Nerve conduction studies done on both lower extremities were within limits.  No evidence of a neuropathy was seen.  EMG evaluation of the right lower extremity shows isolated acute denervation in the gastrocnemius  muscle.  The etiology of this is not clear but could be consistent with an overlying S1 radiculopathy.  A radiculopathy cannot be fully confirmed by the study however.  Clinical correlation is required.  Jill Alexanders MD 11/11/2019 1:58 PM  [facility] [address] [place], [place] [address]  Phone [phone] Fax [phone]

## 2019-11-11 NOTE — Progress Notes (Signed)
Please refer to EMG and nerve conduction procedure note.  

## 2019-11-11 NOTE — Progress Notes (Addendum)
Patient comes in for EMG nerve conduction study evaluation today.  The study shows isolated denervation of the right gastrocnemius muscle, the possibility of an overlying S1 radiculopathy is entertained but cannot be confirmed.  MRI of the lumbar spine was ordered but the patient never had the study done, she will try to get this scheduled.  She is on gabapentin for pain.     [place]    Nerve / Sites Muscle Latency Ref. Amplitude Ref. Rel Amp Segments Distance Velocity Ref. Area    ms ms mV mV %  cm m/s m/s mVms  L Peroneal - EDB     Ankle EDB 4.5 ?6.5 2.9 ?2.0 100 Ankle - EDB 9   11.3     Fib head EDB 10.4  2.6  90.6 Fib head - Ankle 27 46 ?44 10.8     Pop fossa EDB 12.7  2.7  104 Pop fossa - Fib head 10 44 ?44 12.4         Pop fossa - Ankle      R Peroneal - EDB     Ankle EDB 4.3 ?6.5 3.8 ?2.0 100 Ankle - EDB 9   15.0     Fib head EDB 11.0  3.3  87.9 Fib head - Ankle 29 44 ?44 14.1     Pop fossa EDB 13.3  3.4  102 Pop fossa - Fib head 10 44 ?44 14.4         Pop fossa - Ankle      L Tibial - AH     Ankle AH 3.9 ?5.8 9.7 ?4.0 100 Ankle - AH 9   19.3     Pop fossa AH 13.2  4.7  48.6 Pop fossa - Ankle 39 42 ?41 14.6  R Tibial - AH     Ankle AH 4.2 ?5.8 8.1 ?4.0 100 Ankle - AH 9   15.8     Pop fossa AH 13.6  5.3  65.8 Pop fossa - Ankle 39 41 ?41 17.8             SNC    Nerve / Sites Rec. Site Peak Lat Ref.  Amp Ref. Segments Distance    ms ms V V  cm  L Sural - Ankle (Calf)     Calf Ankle 3.6 ?4.4 10 ?6 Calf - Ankle 14  R Sural - Ankle (Calf)     Calf Ankle 3.4 ?4.4 12 ?6 Calf - Ankle 14  L Superficial peroneal - Ankle     Lat leg Ankle 3.8 ?4.4 8 ?6 Lat leg - Ankle 14  R Superficial peroneal - Ankle     Lat leg Ankle 3.7 ?4.4 10 ?6 Lat leg - Ankle 14             F  Wave    Nerve F Lat Ref.   ms ms  L Tibial - AH 54.2 ?56.0  R Tibial - AH 53.2 ?56.0

## 2019-11-12 ENCOUNTER — Other Ambulatory Visit (INDEPENDENT_AMBULATORY_CARE_PROVIDER_SITE_OTHER): Payer: Self-pay

## 2019-11-12 DIAGNOSIS — Z86010 Personal history of colonic polyps: Secondary | ICD-10-CM

## 2019-11-13 ENCOUNTER — Ambulatory Visit
Admission: RE | Admit: 2019-11-13 | Discharge: 2019-11-13 | Disposition: A | Discharge status: Home / Self Care | Payer: Medicare Other | Source: Ambulatory Visit | Attending: Neurology | Admitting: Neurology

## 2019-11-13 DIAGNOSIS — M5431 Sciatica, right side: Secondary | ICD-10-CM

## 2019-11-13 DIAGNOSIS — M5432 Sciatica, left side: Secondary | ICD-10-CM | POA: Diagnosis not present

## 2019-11-14 ENCOUNTER — Telehealth (INDEPENDENT_AMBULATORY_CARE_PROVIDER_SITE_OTHER): Payer: Self-pay | Admitting: *Deleted

## 2019-11-14 ENCOUNTER — Encounter (INDEPENDENT_AMBULATORY_CARE_PROVIDER_SITE_OTHER): Payer: Self-pay | Admitting: *Deleted

## 2019-11-14 ENCOUNTER — Telehealth: Payer: Self-pay | Admitting: Neurology

## 2019-11-14 DIAGNOSIS — M5431 Sciatica, right side: Secondary | ICD-10-CM

## 2019-11-14 DIAGNOSIS — M5432 Sciatica, left side: Secondary | ICD-10-CM

## 2019-11-14 NOTE — Telephone Encounter (Signed)
Patient needs Plenvu (copay card) ° °

## 2019-11-14 NOTE — Telephone Encounter (Signed)
I called the patient.  The MRI of the low back does not show any compression ever explain her sciatic type pain.  In the past, the patient has got benefit from an epidural steroid injection, I will get this set up.  It is possible that the diabetes itself is the etiology of her radicular type pain.   MRI lumbar 11/14/19:  IMPRESSION:   MRI lumbar spine (without) demonstrating: -Mild facet hypertrophy at L4-5, L5-S1 levels.  -Degenerative endplate disease at V1-O6, with mild LEFT foraminal stenosis at L5-S1.

## 2019-11-18 ENCOUNTER — Telehealth (INDEPENDENT_AMBULATORY_CARE_PROVIDER_SITE_OTHER): Payer: Self-pay | Admitting: *Deleted

## 2019-11-18 ENCOUNTER — Encounter (INDEPENDENT_AMBULATORY_CARE_PROVIDER_SITE_OTHER): Payer: Self-pay | Admitting: Gastroenterology

## 2019-11-18 ENCOUNTER — Other Ambulatory Visit: Payer: Self-pay

## 2019-11-18 ENCOUNTER — Other Ambulatory Visit: Payer: Self-pay | Admitting: Neurology

## 2019-11-18 ENCOUNTER — Telehealth: Payer: Self-pay | Admitting: Neurology

## 2019-11-18 ENCOUNTER — Ambulatory Visit (INDEPENDENT_AMBULATORY_CARE_PROVIDER_SITE_OTHER): Payer: Medicare Other | Admitting: Gastroenterology

## 2019-11-18 VITALS — BP 120/85 | HR 116 | Temp 98.0°F | Ht 67.0 in | Wt 274.4 lb

## 2019-11-18 DIAGNOSIS — M5431 Sciatica, right side: Secondary | ICD-10-CM

## 2019-11-18 DIAGNOSIS — R197 Diarrhea, unspecified: Secondary | ICD-10-CM | POA: Diagnosis not present

## 2019-11-18 DIAGNOSIS — M79605 Pain in left leg: Secondary | ICD-10-CM

## 2019-11-18 DIAGNOSIS — M79604 Pain in right leg: Secondary | ICD-10-CM

## 2019-11-18 MED ORDER — PLENVU 140 G PO SOLR: 1.0000 | Freq: Once | ORAL | 0 refills | Status: AC

## 2019-11-18 MED ORDER — DICYCLOMINE HCL 10 MG PO CAPS: 10.0000 mg | ORAL_CAPSULE | Freq: Three times a day (TID) | ORAL | 1 refills | Status: DC | PRN

## 2019-11-18 NOTE — Telephone Encounter (Signed)
Pt. called & asked for Dr. to call her back. She states she wants to talk about epidural injection, that she didn't know she was scheduled for that & she wants to discuss MRI results. Please advise.

## 2019-11-18 NOTE — Telephone Encounter (Signed)
Referring MD/PCP: shah   Procedure: tcs with propofol  Reason/Indication:  Hx polyps  Has patient had this procedure before?  Yes, 2017  If so, when, by whom and where?    Is there a family history of colon cancer?  no  Who?  What age when diagnosed?    Is patient diabetic?   yes      Does patient have prosthetic heart valve or mechanical valve?  no  Do you have a pacemaker/defibrillator?  no  Has patient ever had endocarditis/atrial fibrillation? no  Does patient use oxygen? no  Has patient had joint replacement within last 12 months?  no  Is patient constipated or do they take laxatives? no  Does patient have a history of alcohol/drug use?  no  Is patient on blood thinner such as Coumadin, Plavix and/or Aspirin? yes  Medications: asa 81 mg daily, metformin 500 mg qid, duloxetine 60 mg bid, glimepiride 2 mg daily, letrozole 2.5 mg daily, lisinopril/hctz 20/12.5 mg daily, atorvastatin 10 mg daily, gabapentin 300 mg tid, trazodone 50 mg daily  Allergies: sulfur  Medication Adjustment per Dr Rehman/Dr Jenetta Downer asa 2 days, hold diabetic meds evening before and morning of  Procedure date & time: 12/18/19

## 2019-11-18 NOTE — Telephone Encounter (Signed)
Attempted to call back and LM, however, patient's voice mail said it was full and unable to leave message.  Will send to work in doctor.

## 2019-11-18 NOTE — Patient Instructions (Signed)
You can try dicyclomine as needed, particularly prior to eating out this may help diarrhea. We will call w/ lab and stool study results

## 2019-11-18 NOTE — Progress Notes (Signed)
Patient profile: Lindsay Pacheco is a 59 y.o. female seen for evaluation of diarrhea  History of Present Illness: Lindsay Pacheco is seen today for evaluation of diarrhea - she reports issues with diarrhea since childhood but these are worse over the past 3 to 4 months.  She is currently having 5-6 loose liquid stools daily.  She has some nocturnal stooling but also notes she wakes up routinely to urinate and has a BM at that time. She denies having any formed stools. She denies any blood in stool.  Does have some rectal pain due to the diarrhea frequency and is using a nitroglycerin cream as needed.  She denies any medication changes when symptoms began.  Has tried Imodium without much benefit  He has very occasional GERD symptoms.  She denies any dysphagia.  Mild nausea but no vomiting.  Very occasional epigastric pain since [facility] but this is fairly infrequent  Notes her father who is in his 31s was recently diagnosed with celiac disease.  She also has a brother age 51 he had a partial colectomy for colon polyps.  Wt Readings from Last 3 Encounters:  11/18/19 274 lb 6.4 oz (124.5 kg)  10/15/19 277 lb (125.6 kg)  07/16/19 283 lb (128.4 kg)     Last Colonoscopy: 12/2015- One small polyp in the cecum. Biopsied. - Five 5 mm polyps in the ascending colon, removed with a cold snare. Resected and retrieved. - Diverticulosis in the sigmoid colon. - One 30 mm polyp in the rectum, removed with a hot snare. Resected and retrieved. - External hemorrhoids.  Biopsy results reviewed with patient. Rectal polyp is sessile serrated adenoma without dysplasia. 2 other small polyps are tubular adenomas.    Last Endoscopy: none prior    Past Medical History:  Past Medical History:  Diagnosis Date   Anxiety    Arthritis    "knees" (11/02/2015)   Breast cancer ([place]) 2017   left breast    Breast cancer, left breast ([place]) 09/30/2015   Cataracts, bilateral    just watching right now    Depression    Diabetes mellitus, type II ([place])    Diverticulitis 1987   Hyperlipidemia    Hypertension    Irritable bowel syndrome (IBS)    Personal history of radiation therapy    Sleep apnea    does not use CPAP but "I'm suppose to" (11/02/2015)   SVD (spontaneous vaginal delivery)    x 1    Problem List: Patient Active Problem List   Diagnosis Date Noted   OSA (obstructive sleep apnea) 02/25/2019   Macular hole, left eye 07/03/2018   Borderline personality disorder ([place]) 06/15/2017   Moderate episode of recurrent major depressive disorder ([place]) 10/22/2015   Malignant neoplasm of upper-inner quadrant of left breast in female, estrogen receptor positive ([place]) 10/14/2015   Diabetes ([place]) 09/24/2015   Hypertension     Past Surgical History: Past Surgical History:  Procedure Laterality Date   66 GAUGE PARS PLANA VITRECTOMY WITH 20 GAUGE MVR PORT FOR MACULAR HOLE Left 07/03/2018   Procedure: 25 GAUGE PARS PLANA VITRECTOMY WITH 20 GAUGE MVR PORT FOR MACULAR HOLE;  Surgeon: [name], MD;  Location: [facility];  Service: Ophthalmology;  Laterality: Left;   BREAST BIOPSY Left 09/2015   BREAST LUMPECTOMY Left 11/02/2015   BREAST LUMPECTOMY WITH AXILLARY LYMPH NODE BIOPSY Left 11/02/2015   BREAST LUMPECTOMY WITH BRACKETED RADIOACTIVE SEED AND LEFT AXILLARY SENTINEL LYMPH NODE BIOPSY    BREAST LUMPECTOMY  WITH RADIOACTIVE SEED AND SENTINEL LYMPH NODE BIOPSY Left 11/02/2015   Procedure: LEFT BREAST LUMPECTOMY WITH BRACKETED RADIOACTIVE SEED AND LEFT AXILLARY SENTINEL LYMPH NODE BIOPSY;  Surgeon: [name], MD;  Location: [facility];  Service: General;  Laterality: Left;   COLONOSCOPY N/A 12/09/2015   Procedure: COLONOSCOPY;  Surgeon: [name], MD;  [facility];  Service: Endoscopy;  Laterality: N/A;  1:00   EVACUATION BREAST HEMATOMA Left 11/02/2015   EVACUATION BREAST HEMATOMA Left 11/02/2015   Procedure: EVACUATION HEMATOMA  BREAST;  Surgeon: [name], MD;  Location: [facility];  Service: General;  Laterality: Left;   GAS/FLUID EXCHANGE Left 07/03/2018   Procedure: Gas/Fluid Exchange;  Surgeon: [name], MD;  Location: [facility];  Service: Ophthalmology;  Laterality: Left;   JOINT REPLACEMENT     KNEE ARTHROSCOPY Right 2000s   LAPAROSCOPIC CHOLECYSTECTOMY  1995   PHOTOCOAGULATION WITH LASER Left 07/03/2018   Procedure: Photocoagulation With Laser;  Surgeon: [name], MD;  Location: [facility];  Service: Ophthalmology;  Laterality: Left;   POLYPECTOMY  12/09/2015   Procedure: POLYPECTOMY; colon polyp  Surgeon: [name], MD;  [facility];  Service: Endoscopy;;  multiple   TOTAL KNEE ARTHROPLASTY Right 2011   WISDOM TOOTH EXTRACTION      Allergies: Allergies  Allergen Reactions   Adhesive [Tape] Rash    Not all adhesives    Sulfa Antibiotics Nausea And Vomiting      Home Medications:  Current Outpatient Medications:    acetaminophen (TYLENOL) 500 MG tablet, Take 1,000 mg by mouth daily as needed for headache., Disp: , Rfl:    aspirin 81 MG tablet, Take 81 mg by mouth daily., Disp: , Rfl:    atorvastatin (LIPITOR) 10 MG tablet, Take 1 tablet (10 mg total) by mouth daily. For high cholesterol, Disp: 30 tablet, Rfl: 0   bismuth subsalicylate (PEPTO BISMOL) 262 MG/15ML suspension, Take 30 mLs by mouth every 6 (six) hours as needed for indigestion or diarrhea or loose stools., Disp: , Rfl:    calcium carbonate (TUMS - DOSED IN MG ELEMENTAL CALCIUM) 500 MG chewable tablet, Chew 2 tablets by mouth daily as needed for indigestion or heartburn., Disp: , Rfl:    cetirizine (ZYRTEC) 10 MG tablet, Take 10 mg by mouth daily., Disp: , Rfl:    DULoxetine (CYMBALTA) 60 MG capsule, Take 1 capsule (60 mg total) by mouth 2 (two) times daily. For mood control (Patient taking differently: Take 120 mg by mouth. For mood control), Disp: 60 capsule, Rfl: 0    gabapentin (NEURONTIN) 300 MG capsule, One capsule in the morning and 2 in the evening, Disp: 270 capsule, Rfl: 1   glimepiride (AMARYL) 2 MG tablet, Take 2 mg by mouth daily with breakfast., Disp: , Rfl:    letrozole (FEMARA) 2.5 MG tablet, TAKE ONE TABLET BY MOUTH EVERY DAY, Disp: 90 tablet, Rfl: 3   lisinopril (PRINIVIL,ZESTRIL) 20 MG tablet, Take 1 tablet (20 mg total) by mouth daily. For high blood pressure, Disp: 30 tablet, Rfl: 0   metFORMIN (GLUCOPHAGE) 500 MG tablet, Take 1,000 mg by mouth 2 (two) times daily with a meal. , Disp: , Rfl:    traZODone (DESYREL) 50 MG tablet, Take 1 tablet (50 mg total) by mouth at bedtime as needed for sleep., Disp: 30 tablet, Rfl: 0   dicyclomine (BENTYL) 10 MG capsule, Take 1 capsule (10 mg total) by mouth 3 (three) times daily as needed (diarrhea).,  Disp: 60 capsule, Rfl: 1   lisinopril-hydrochlorothiazide (ZESTORETIC) 20-12.5 MG tablet, Take 1 tablet by mouth daily., Disp: , Rfl:    PEG-KCl-NaCl-NaSulf-Na Asc-C (PLENVU) 140 g SOLR, Take 1 kit by mouth once for 1 dose. (Patient not taking: Reported on 11/18/2019), Disp: 1 each, Rfl: 0   Family History: family history includes Anxiety disorder in her father; Breast cancer in her paternal aunt; Depression in her father and sister; Diabetes in her mother; Other in her father.    Social History:   reports that she has never smoked. She has never used smokeless tobacco. She reports previous alcohol use. She reports previous drug use.   Review of Systems: Constitutional: Denies weight loss/weight gain  Eyes: No changes in vision. ENT: No oral lesions, sore throat.  GI: see HPI.  Heme/Lymph: No easy bruising.  CV: No chest pain.  GU: No hematuria.  Integumentary: No rashes.  Neuro: No headaches.  Psych: No depression/anxiety.  Endocrine: No heat/cold intolerance.  Allergic/Immunologic: No urticaria.  Resp: No cough, SOB.  Musculoskeletal: No joint swelling.    Physical Examination: BP  120/85 (BP Location: Right Arm, Patient Position: Sitting, Cuff Size: Large)    Pulse (!) 116    Temp 98 F (36.7 C) (Oral)    Ht '5\' 7"'  (1.702 m)    Wt 274 lb 6.4 oz (124.5 kg)    LMP  (LMP Unknown)    BMI 42.98 kg/m  Gen: NAD, alert and oriented x 4 HEENT: PEERLA, EOMI, Neck: supple, no JVD Chest: CTA bilaterally, no wheezes, crackles, or other adventitious sounds CV: RRR, no m/g/c/r Abd: soft, NT, ND, +BS in all four quadrants; no HSM, guarding, ridigity, or rebound tenderness Ext: no edema, well perfused with 2+ pulses, Skin: no rash or lesions noted on observed skin Lymph: no noted LAD  Data Reviewed:  Reports PCP monitors routine labs which have been normal   Assessment/Plan: Ms. Pho is a 59 y.o. female seen for evaluation of diarrhea  1.  Diarrhea-patient is already scheduled for colonoscopy for history of colon polyps with a 30 mm polyp in 2017.  Will check stool studies for infection although given chronicity less likely.  Would recommend random biopsies for exclusion of microscopic colitis.  She notes her father was recently diagnosed with celiac disease and will check a celiac panel today.  We will give her dicyclomine to use in interim as she denies significant improvement with Imodium.  2.  History of colon polyps in herself and her brother-overdue for repeat colonoscopy  Patient denies CP, SOB, and use of blood thinners. I discussed the risks and benefits of procedure including bleeding, perforation, infection, missed lesions, medication reactions and possible hospitalization or surgery if complications. All questions answered.  She denies prior issues with sedation.  She wears CPAP some nights but has had issues with machine recently.   Phung was seen today for diarrhea.  Diagnoses and all orders for this visit:  Diarrhea, unspecified type -     Celiac Disease Panel -     C. difficile GDH and Toxin A/B -     Gastrointestinal Pathogen Panel PCR  Other orders -      dicyclomine (BENTYL) 10 MG capsule; Take 1 capsule (10 mg total) by mouth 3 (three) times daily as needed (diarrhea).      I personally performed the service, non-incident to. (WP)  Laurine Blazer, [facility]

## 2019-11-19 ENCOUNTER — Ambulatory Visit
Admission: RE | Admit: 2019-11-19 | Discharge: 2019-11-19 | Disposition: A | Discharge status: Home / Self Care | Payer: Medicare Other | Source: Ambulatory Visit | Attending: Neurology | Admitting: Neurology

## 2019-11-19 DIAGNOSIS — M79604 Pain in right leg: Secondary | ICD-10-CM

## 2019-11-19 DIAGNOSIS — M5431 Sciatica, right side: Secondary | ICD-10-CM

## 2019-11-19 DIAGNOSIS — M79605 Pain in left leg: Secondary | ICD-10-CM

## 2019-11-19 DIAGNOSIS — M47817 Spondylosis without myelopathy or radiculopathy, lumbosacral region: Secondary | ICD-10-CM | POA: Diagnosis not present

## 2019-11-19 DIAGNOSIS — M5432 Sciatica, left side: Secondary | ICD-10-CM

## 2019-11-19 LAB — CELIAC DISEASE PANEL
(tTG) Ab, IgA: <1 U/mL
(tTG) Ab, IgG: <1 U/mL
Gliadin IgA: <1 U/mL
Gliadin IgG: <1 U/mL
Immunoglobulin A: 354 mg/dL — ABNORMAL HIGH (ref 47–310)

## 2019-11-19 MED ORDER — IOPAMIDOL (ISOVUE-M 200) INJECTION 41%
1.0000 mL | Freq: Once | INTRAMUSCULAR | Status: AC
  Administered 2019-11-19: 1 mL via EPIDURAL

## 2019-11-19 MED ORDER — METHYLPREDNISOLONE ACETATE 40 MG/ML INJ SUSP (RADIOLOG
120.0000 mg | Freq: Once | INTRAMUSCULAR | Status: AC
  Administered 2019-11-19: 120 mg via EPIDURAL

## 2019-11-24 NOTE — Progress Notes (Signed)
[facility]  [phone] [phone]     ID: Lindsay Pacheco DOB: 11/28/1960  MR#: [id number]  CSN#:[id number]  Patient Care Team: Shah, Ashish, MD as PCP - General (Internal Medicine) ,  C, MD as Consulting Physician (Oncology) Moody, John, MD as Consulting Physician (Radiation Oncology) Wakefield, Matthew, MD as Consulting Physician (General Surgery) Rehman, Najeeb U, MD as Consulting Physician (Gastroenterology) Hisada, Reina, MD as Consulting Physician (Psychiatry) Aluisio, Frank, MD as Consulting Physician (Orthopedic Surgery) Willis, Charles K, MD as Consulting Physician (Neurology) OTHER MD:  CHIEF COMPLAINT: Estrogen receptor positive breast cancer  CURRENT TREATMENT: Letrozole   INTERVAL HISTORY: Lindsay Pacheco returns today for follow up of her estrogen positive bresat cancer.    She continues on letrozole.  She is tolerating that moderately well.  She has not had significant problems with arthralgias or myalgias on this medication.  Since her last visit, she underwent bilateral diagnostic mammography with tomography at [facility] on 11/11/2019 showing: breast density category B; no evidence of malignancy in either breast.   MRI of the lumbar spine 11/13/2019 for evaluation of bilateral leg pain and numbness showed some degenerative disease but no evidence of metastatic disease.  She had an epidural injection 11/19/2019  Of note, she is scheduled for colonoscopy on 12/18/2019 under Dr. Rehman.   REVIEW OF SYSTEMS: Lindsay Pacheco has been very limited because of her back pain. Now that she is getting shots she thinks she may be able to walk a little but she is walking with a bit limp today. She still gets some hot flashes from the letrozole particularly around her hairline she says. Vaginal dryness is not an issue. She has had some unusual patterns in bowel movements and is planning colonoscopy as noted but she does have a history of  irritable bowel. There has not been any melena that she is aware of. A detailed review of systems today was otherwise stable   COVID 19 VACCINATION STATUS: s/p Moderna x2, most recently March 2021, with booster pending   BREAST CANCER HISTORY: From the original intake note:  The patient had routine bilateral screening mammography 09/10/2015 showing apparent new right breast calcifications and an area of distortion and possible mass in the left breast. On 09/23/2015 she underwent bilateral diagnostic mammography and left breast ultrasonography. The breast density was category C. The right breast calcification were felt to be benign. However in the left breast there was a persistent area of distortion in the superior subareolar areolar area, with 2 or 3 small masses in the upper inner quadrant. On exam there was an area of firm tissue just above the nipple of the left breast. Ultrasonography confirmed an area of shadowing at the 12:00 radiant 1 cm from the nipple and in addition 3 adjacent hypoechoic masses in the upper inner quadrant of the left breast, the largest measuring 1.1 cm and altogether measuring 2.5 cm. Ultrasound of the left axilla was benign  On 09/30/2015 the patient underwent cyst aspiration and also ultrasound-guided biopsy of 2 areas in the left breast, described as 11:00 and 12:00. The 12:00 area showed invasive ductal carcinoma, grade 1. The 11:00 biopsy showed ductal carcinoma in situ. The invasive tumor was estrogen receptor positive at greater than 90%, progesterone receptor positive at greater than 90%, both with strong staining intensity. HER-2 was equivocal by immunohistochemistry but negative by FISH with a signals ratio of 1.23 and the number per cell 2.0.  On 10/12/2015 the patient had a breast MRI showing   no findings on the right, but on the left at the 12:00 region there was a spiculated enhancing mass measuring 2.2 cm. In the upper inner quadrant of the left breast there was  an area of enhancement measuring 1.1 cm. In the lateral aspect of the breast was a benign-appearing intramammary lymph node and there were no abnormal appearing lymph nodes in either axilla.  With this information the patient proceeded to left seed bracketed lumpectomy and sentinel lymph node sampling 11/02/2015. The final pathology ([id number]) confirmed invasive ductal carcinoma, grade 1, measuring 2.2 cm, with all 3 sentinel lymph nodes clear. Margins were clear. The repeat prognostic panel again showed estrogen receptor 100% positivity, progesterone receptor 100% positivity, both with strong staining intensity, and no HER-2 amplification, the signals ratio being 1.06 and the number per cell 1.90.  Oncotype DX score of 18 predicted a 10 year risk of outside the breast recurrence of 12% if the patient's only systemic treatment was tamoxifen for 5 years. It also predicted no significant benefit from chemotherapy The patient then proceeded to adjuvant radiation completed 02/11/2016. She was started on letrozole 03/03/2016 by our survivorship nurse practitioner  Her subsequent history is as detailed below   PAST MEDICAL HISTORY: Past Medical History:  Diagnosis Date  . Anxiety   . Arthritis    "knees" (11/02/2015)  . Breast cancer (HCC) 2017   left breast   . Breast cancer, left breast (HCC) 09/30/2015  . Cataracts, bilateral    just watching right now  . Depression   . Diabetes mellitus, type II (HCC)   . Diverticulitis 1987  . Hyperlipidemia   . Hypertension   . Irritable bowel syndrome (IBS)   . Personal history of radiation therapy   . Sleep apnea    does not use CPAP but "I'm suppose to" (11/02/2015)  . SVD (spontaneous vaginal delivery)    x 1    PAST SURGICAL HISTORY: Past Surgical History:  Procedure Laterality Date  . 25 GAUGE PARS PLANA VITRECTOMY WITH 20 GAUGE MVR PORT FOR MACULAR HOLE Left 07/03/2018   Procedure: 25 GAUGE PARS PLANA VITRECTOMY WITH 20 GAUGE MVR PORT  FOR MACULAR HOLE;  Surgeon: Matthews, John D, MD;  Location: [facility] OR;  Service: Ophthalmology;  Laterality: Left;  . BREAST BIOPSY Left 09/2015  . BREAST LUMPECTOMY Left 11/02/2015  . BREAST LUMPECTOMY WITH AXILLARY LYMPH NODE BIOPSY Left 11/02/2015   BREAST LUMPECTOMY WITH BRACKETED RADIOACTIVE SEED AND LEFT AXILLARY SENTINEL LYMPH NODE BIOPSY   . BREAST LUMPECTOMY WITH RADIOACTIVE SEED AND SENTINEL LYMPH NODE BIOPSY Left 11/02/2015   Procedure: LEFT BREAST LUMPECTOMY WITH BRACKETED RADIOACTIVE SEED AND LEFT AXILLARY SENTINEL LYMPH NODE BIOPSY;  Surgeon: Matthew Wakefield, MD;  Location: [facility];  Service: General;  Laterality: Left;  . COLONOSCOPY N/A 12/09/2015   Procedure: COLONOSCOPY;  Surgeon: Najeeb U Rehman, MD;  Location: [facility];  Service: Endoscopy;  Laterality: N/A;  1:00  . EVACUATION BREAST HEMATOMA Left 11/02/2015  . EVACUATION BREAST HEMATOMA Left 11/02/2015   Procedure: EVACUATION HEMATOMA BREAST;  Surgeon: Matthew Wakefield, MD;  Location:  SURGERY CENTER;  Service: General;  Laterality: Left;  . GAS/FLUID EXCHANGE Left 07/03/2018   Procedure: Gas/Fluid Exchange;  Surgeon: Matthews, John D, MD;  Location: [facility] OR;  Service: Ophthalmology;  Laterality: Left;  . JOINT REPLACEMENT    . KNEE ARTHROSCOPY Right 2000s  . LAPAROSCOPIC CHOLECYSTECTOMY  1995  . PHOTOCOAGULATION WITH LASER Left 07/03/2018   Procedure: Photocoagulation With Laser;  Surgeon:   Matthews, John D, MD;  Location: [facility] OR;  Service: Ophthalmology;  Laterality: Left;  . POLYPECTOMY  12/09/2015   Procedure: POLYPECTOMY; colon polyp  Surgeon: Najeeb U Rehman, MD;  [facility];  Service: Endoscopy;;  multiple  . TOTAL KNEE ARTHROPLASTY Right 2011  . WISDOM TOOTH EXTRACTION      FAMILY HISTORY Family History  Problem Relation Age of Onset  . Anxiety disorder Father   . Depression Father   . Other Father        Abestosis  . Depression Sister   . Diabetes Mother   . Breast  cancer Paternal Aunt   The patient's parents are still living, in their mid to late 70s. The patient has one brother and one sister. On the paternal side one uncle had spinal cord cancer and both paternal grandparents had lung cancer in the setting of tobacco abuse. The patient has one paternal aunt diagnosed with breast cancer in her 40s   GYNECOLOGIC HISTORY:  No LMP recorded (lmp unknown). Patient is postmenopausal. Menarche age 14, first live birth age 32, she is GX P1. She went through menopause in her early 40s. She never took hormone replacement.   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  She is divorced. She lives alone though her mother lives next door and given her mother's cognitive difficulties Lindsay Pacheco thinks she may be moving in with her soon. Daughter Blair graduated from [employer] and is working in HR for the [employer]. The patient is a [employer].    ADVANCED DIRECTIVES: Patient has the paperwork but has not yet completed it   HEALTH MAINTENANCE: Social History   Tobacco Use  . Smoking status: Never Smoker  . Smokeless tobacco: Never Used  Vaping Use  . Vaping Use: Never used  Substance Use Topics  . Alcohol use: Not Currently    Comment: socially   . Drug use: Not Currently    Comment: last used in 2018     Colonoscopy: 12/09/2015/Rehman  PAP:  Bone density: DEC 2017/ [facility]   Allergies  Allergen Reactions  . Adhesive [Tape] Rash    Not all adhesives   . Sulfa Antibiotics Nausea And Vomiting    Current Outpatient Medications  Medication Sig Dispense Refill  . acetaminophen (TYLENOL) 500 MG tablet Take 1,000 mg by mouth daily as needed for headache.    . aspirin 81 MG tablet Take 81 mg by mouth daily.    . atorvastatin (LIPITOR) 10 MG tablet Take 1 tablet (10 mg total) by mouth daily. For high cholesterol 30 tablet 0  .  bismuth subsalicylate (PEPTO BISMOL) 262 MG/15ML suspension Take 30 mLs by mouth every 6 (six) hours as needed for indigestion or diarrhea or loose stools.    . calcium carbonate (TUMS - DOSED IN MG ELEMENTAL CALCIUM) 500 MG chewable tablet Chew 2 tablets by mouth daily as needed for indigestion or heartburn.    . cetirizine (ZYRTEC) 10 MG tablet Take 10 mg by mouth daily.    . dicyclomine (BENTYL) 10 MG capsule Take 1 capsule (10 mg total) by mouth 3 (three) times daily as needed (diarrhea). 60 capsule 1  . DULoxetine (CYMBALTA) 60 MG capsule Take 1 capsule (60 mg total) by mouth 2 (two) times daily. For mood control (Patient taking differently: Take 120 mg by mouth. For mood control) 60 capsule 0  . gabapentin (NEURONTIN) 300   MG capsule One capsule in the morning and 2 in the evening 270 capsule 1  . glimepiride (AMARYL) 2 MG tablet Take 2 mg by mouth daily with breakfast.    . letrozole (FEMARA) 2.5 MG tablet TAKE ONE TABLET BY MOUTH EVERY DAY 90 tablet 3  . lisinopril (PRINIVIL,ZESTRIL) 20 MG tablet Take 1 tablet (20 mg total) by mouth daily. For high blood pressure 30 tablet 0  . lisinopril-hydrochlorothiazide (ZESTORETIC) 20-12.5 MG tablet Take 1 tablet by mouth daily.    . metFORMIN (GLUCOPHAGE) 500 MG tablet Take 1,000 mg by mouth 2 (two) times daily with a meal.     . traZODone (DESYREL) 50 MG tablet Take 1 tablet (50 mg total) by mouth at bedtime as needed for sleep. 30 tablet 0   No current facility-administered medications for this visit.    OBJECTIVE: White woman in no acute distress  Vitals:   11/25/19 0930  BP: 126/88  Pulse: (!) 101  Resp: 18  Temp: 97.9 F (36.6 C)  SpO2: 95%     Body mass index is 42.29 kg/m.    ECOG FS:1 - Symptomatic but completely ambulatory  Sclerae unicteric, EOMs intact Wearing a mask No cervical or supraclavicular adenopathy Lungs no rales or rhonchi Heart regular rate and rhythm Abd soft, nontender, positive bowel sounds MSK no focal  spinal tenderness, no upper extremity lymphedema Neuro: nonfocal, well oriented, appropriate affect Breasts: The right breast is unremarkable. The left breast is status post lumpectomy and radiation. The cosmetic result is excellent. There is no evidence of local recurrence. Both axillae are benign.   LAB RESULTS:  CMP     Component Value Date/Time   NA 139 11/22/2018 1116   NA 140 10/13/2016 1222   K 4.2 11/22/2018 1116   K 4.0 10/13/2016 1222   CL 102 11/22/2018 1116   CO2 26 11/22/2018 1116   CO2 27 10/13/2016 1222   GLUCOSE 201 (H) 11/22/2018 1116   GLUCOSE 234 (H) 10/13/2016 1222   BUN 12 11/22/2018 1116   BUN 12.5 10/13/2016 1222   CREATININE 0.83 11/22/2018 1116   CREATININE 0.9 10/13/2016 1222   CALCIUM 9.3 11/22/2018 1116   CALCIUM 10.0 10/13/2016 1222   PROT 7.3 11/22/2018 1116   PROT 7.5 10/13/2016 1222   ALBUMIN 3.8 11/22/2018 1116   ALBUMIN 3.7 10/13/2016 1222   AST 17 11/22/2018 1116   AST 17 10/13/2016 1222   ALT 19 11/22/2018 1116   ALT 15 10/13/2016 1222   ALKPHOS 145 (H) 11/22/2018 1116   ALKPHOS 139 10/13/2016 1222   BILITOT 0.6 11/22/2018 1116   BILITOT 0.67 10/13/2016 1222   GFRNONAA >60 11/22/2018 1116   GFRAA >60 11/22/2018 1116    No results found for: TOTALPROTELP, ALBUMINELP, A1GS, A2GS, BETS, BETA2SER, GAMS, MSPIKE, SPEI  No results found for: Nils Pyle, [facility]  Lab Results  Component Value Date   WBC 14.9 (H) 11/25/2019   NEUTROABS 10.5 (H) 11/25/2019   HGB 13.7 11/25/2019   HCT 43.4 11/25/2019   MCV 86.3 11/25/2019   PLT 368 11/25/2019      Chemistry      Component Value Date/Time   NA 139 11/22/2018 1116   NA 140 10/13/2016 1222   K 4.2 11/22/2018 1116   K 4.0 10/13/2016 1222   CL 102 11/22/2018 1116   CO2 26 11/22/2018 1116   CO2 27 10/13/2016 1222   BUN 12 11/22/2018 1116   BUN 12.5 10/13/2016 1222   CREATININE 0.83 11/22/2018 1116  CREATININE 0.9 10/13/2016 1222      Component Value Date/Time     CALCIUM 9.3 11/22/2018 1116   CALCIUM 10.0 10/13/2016 1222   ALKPHOS 145 (H) 11/22/2018 1116   ALKPHOS 139 10/13/2016 1222   AST 17 11/22/2018 1116   AST 17 10/13/2016 1222   ALT 19 11/22/2018 1116   ALT 15 10/13/2016 1222   BILITOT 0.6 11/22/2018 1116   BILITOT 0.67 10/13/2016 1222       No results found for: LABCA2  No components found for: [id number]  No results for input(s): INR in the last 168 hours.  Urinalysis    Component Value Date/Time   COLORURINE YELLOW 01/20/2010 1045   APPEARANCEUR CLEAR 01/20/2010 1045   LABSPEC 1.016 01/20/2010 1045   PHURINE 6.0 01/20/2010 1045   GLUCOSEU NEGATIVE 10/06/2009 1040   HGBUR NEGATIVE 01/20/2010 [place] 01/20/2010 [place] 01/20/2010 1045   PROTEINUR NEGATIVE 01/20/2010 1045   UROBILINOGEN 0.2 01/20/2010 1045   NITRITE NEGATIVE 01/20/2010 1045   LEUKOCYTESUR SMALL (A) 01/20/2010 1045     STUDIES: MR LUMBAR SPINE WO CONTRAST  Result Date: 11/14/2019 [facility] NEUROIMAGING REPORT STUDY DATE: 11/13/19 PATIENT NAME: ADALINA DOPSON DOB: [birth year] MRN: [id number] ORDERING CLINICIAN: Kathrynn Ducking, MD CLINICAL HISTORY: 59 year old female with bilateral leg pain and numbness. EXAM: MR LUMBAR SPINE WO CONTRAST TECHNIQUE: MRI of the lumbar spine was obtained utilizing 4 mm sagittal slices from C58-52 down to the lower sacrum with T1, T2 and inversion recovery views. In addition 4 mm axial slices from D7-8 down to L5-S1 level were included with T1 and T2 weighted views. CONTRAST: None COMPARISON: None IMAGING SITE: [facility] [address] (1.5 Tesla MRI)  FINDINGS: On sagittal views the vertebral bodies have normal height and alignment.  Degenerative endplate disease with fatty marrow degeneration at L5-S1.  Mild disc bulging at L1-2.  The conus medullaris terminates at the level of L1.  On axial views: L1-2: disc bulging with no spinal stenosis or foraminal  narrowing L2-3: no spinal stenosis or foraminal narrowing L3-4: no spinal stenosis or foraminal narrowing L4-5: facet hypertrophy with no spinal stenosis or foraminal narrowing L5-S1: disc bulging and facet hypertrophy with mild left foraminal stenosis Limited views of the aorta, kidneys, iliopsoas muscles and sacroiliac joints are unremarkable except for 4 mm simple cyst of right kidney and inferior paraspinal muscle atrophy.   MRI lumbar spine (without) demonstrating: -Mild facet hypertrophy at L4-5, L5-S1 levels. -Degenerative endplate disease at E4-M3, with mild LEFT foraminal stenosis at L5-S1. INTERPRETING PHYSICIAN: Penni Bombard, MD Certified in Neurology, Neurophysiology and Neuroimaging [facility] [address], [place], [place] [address] [phone]   NCV with EMG(electromyography)  Result Date: 11/11/2019 Kathrynn Ducking, MD     11/11/2019  2:00 PM HISTORY: Lorayne Getchell is a 59 year old patient with a history of back pain and right leg discomfort down to the big toe, on the left she has knee pain and some numbness in the left thigh.  The patient is being evaluated for a possible lumbosacral radiculopathy. NERVE CONDUCTION STUDIES: Nerve conduction studies were performed on both lower extremities. The distal motor latencies and motor amplitudes for the peroneal and posterior tibial nerves were within normal limits. The nerve conduction velocities for these nerves were also normal. The sensory latencies for the peroneal and sural nerves were within normal limits. The F wave latencies for the posterior tibial nerves were within  normal limits. EMG STUDIES: EMG study was performed on the right lower extremity: The tibialis anterior muscle reveals 2 to 4K motor units with full recruitment. No fibrillations or positive waves were seen. The peroneus tertius muscle reveals 2 to 4K motor units with full recruitment. No fibrillations or positive waves were seen. The medial  gastrocnemius muscle reveals 1 to 3K motor units with full recruitment. 2+ positive waves were seen. The vastus lateralis muscle reveals 2 to 4K motor units with full recruitment. No fibrillations or positive waves were seen. The iliopsoas muscle reveals 2 to 4K motor units with full recruitment. No fibrillations or positive waves were seen. The biceps femoris muscle (long head) reveals 2 to 4K motor units with full recruitment. No fibrillations or positive waves were seen. The lumbosacral paraspinal muscles were tested at 3 levels, and revealed no abnormalities of insertional activity at all 3 levels tested. There was good relaxation. IMPRESSION: Nerve conduction studies done on both lower extremities were within limits.  No evidence of a neuropathy was seen.  EMG evaluation of the right lower extremity shows isolated acute denervation in the gastrocnemius muscle.  The etiology of this is not clear but could be consistent with an overlying S1 radiculopathy.  A radiculopathy cannot be fully confirmed by the study however.  Clinical correlation is required. C. Keith Willis MD 11/11/2019 1:58 PM [facility] [address] [place], [place] [address] Phone [phone] Fax [phone]   MM DIAG BREAST TOMO BILATERAL  Result Date: 11/11/2019 CLINICAL DATA:  History of left breast cancer status post lumpectomy in 2017. EXAM: DIGITAL DIAGNOSTIC BILATERAL MAMMOGRAM WITH TOMO AND CAD COMPARISON:  Previous exam(s). ACR Breast Density Category b: There are scattered areas of fibroglandular density. FINDINGS: Stable lumpectomy changes are seen in the left breast. No suspicious mass or malignant type microcalcifications identified in either breast. Mammographic images were processed with CAD. IMPRESSION: No evidence of malignancy in either breast. RECOMMENDATION: Bilateral diagnostic mammogram in 1 year is recommended. I have discussed the findings and recommendations with the patient. If  applicable, a reminder letter will be sent to the patient regarding the next appointment. BI-RADS CATEGORY  2: Benign. Electronically Signed   By: Dina  Arceo M.D.   On: 11/11/2019 10:32   DG INJECT DIAG/THERA/INC NEEDLE/CATH/PLC EPI/LUMB/SAC W/IMG  Result Date: 11/19/2019 CLINICAL DATA:  Lumbosacral spondylosis without myelopathy. Low back pain, left thigh numbness, and right lower leg pain. FLUOROSCOPY TIME:  Fluoroscopy Time: 13 seconds Radiation Exposure Index: 23.78 microGray*m^2 PROCEDURE: The procedure, risks, benefits, and alternatives were explained to the patient. Questions regarding the procedure were encouraged and answered. The patient understands and consents to the procedure. LUMBAR EPIDURAL INJECTION: An interlaminar approach was performed on the left at L5-S1. The overlying skin was cleansed and anesthetized. A 20 gauge epidural needle was advanced using loss-of-resistance technique. A 6 inch needle was used, however a 3.5 inch needle would have sufficed. DIAGNOSTIC EPIDURAL INJECTION: Injection of Isovue-M 200 shows a good epidural pattern with spread above and below the level of needle placement, primarily on the left. No vascular opacification is seen. THERAPEUTIC EPIDURAL INJECTION: 120 mg of Depo-Medrol mixed with 3 mL of 1% lidocaine were instilled. The procedure was well-tolerated, and the patient was discharged thirty minutes following the injection in good condition. COMPLICATIONS: None IMPRESSION: Technically successful interlaminar epidural injection on the left at L5-S1. Electronically Signed   By: Allen  Grady M.D.   On: 11/19/2019 12:45     ASSESSMENT: 59 y.o. [place],   [place] woman status post left breast biopsy 2 (overlapping sites) 09/30/2015 for invasive ductal carcinoma, grade 1, estrogen receptor and progesterone receptor strongly positive, HER-2 nonamplified.  (1) status post left lumpectomy 11/02/2015 for a pT2 pN0, stage IIA (2017 staging) invasive ductal carcinoma, again  estrogen and progesterone receptor positive, HER-2 not amplified, with negative margins  (a) restaged as IB in the 2018 prognostic classification     (2) Oncotype score of 18 predicts a 10 year risk of recurrence outside the breast of 12% if the patient's only systemic therapy is tamoxifen for 5 years. It also predicts no significant benefit from chemotherapy  (3) Adjuvant radiation 12/22/15 - 02/11/16 Site/dose:    1) Left breast: 50.4 Gy in 28 fractions 2) Left breast boost: 10 Gy in 5 fractions  (4) letrozole started 03/03/2016  (a) DEXA scan 12/07/2015 at [facility] showed a T score of -0.8   PLAN: Arabela is now just over 4 years out from definitive surgery for her breast cancer with no evidence of disease recurrence. This is very favorable.  She is 3-1/2 years into her antiestrogen therapy. When she sees me a year from now she will likely "graduate" from follow-up.  We discussed her mammography which shows her breast density is currently category capital B. This is very favorable.  We will obtain a DEXA scan with her next mammography at [facility]  Total encounter time 25 minutes.*    C. , MD  11/25/19 9:46 AM Medical Oncology and Hematology [facility] [address] [place], [place] [address] Tel. [phone]    Fax. [phone]   I, Katie Daubenspeck, am acting as scribe for Dr.  C. .  I,   MD, have reviewed the above documentation for accuracy and completeness, and I agree with the above.   *Total Encounter Time as defined by the Centers for Medicare and Medicaid Services includes, in addition to the face-to-face time of a patient visit (documented in the note above) non-face-to-face time: obtaining and reviewing outside history, ordering and reviewing medications, tests or procedures, care coordination (communications with other health care professionals or caregivers) and documentation in the medical  record.  

## 2019-11-25 ENCOUNTER — Inpatient Hospital Stay: Payer: Medicare Other | Attending: Oncology | Admitting: Oncology

## 2019-11-25 ENCOUNTER — Other Ambulatory Visit: Payer: Self-pay

## 2019-11-25 ENCOUNTER — Inpatient Hospital Stay: Payer: Medicare Other

## 2019-11-25 ENCOUNTER — Telehealth: Payer: Self-pay | Admitting: Oncology

## 2019-11-25 VITALS — BP 126/88 | HR 101 | Temp 97.9°F | Resp 18 | Ht 67.0 in | Wt 270.0 lb

## 2019-11-25 DIAGNOSIS — Z803 Family history of malignant neoplasm of breast: Secondary | ICD-10-CM | POA: Diagnosis not present

## 2019-11-25 DIAGNOSIS — Z923 Personal history of irradiation: Secondary | ICD-10-CM | POA: Insufficient documentation

## 2019-11-25 DIAGNOSIS — C50212 Malignant neoplasm of upper-inner quadrant of left female breast: Secondary | ICD-10-CM

## 2019-11-25 DIAGNOSIS — Z780 Asymptomatic menopausal state: Secondary | ICD-10-CM | POA: Diagnosis not present

## 2019-11-25 DIAGNOSIS — Z801 Family history of malignant neoplasm of trachea, bronchus and lung: Secondary | ICD-10-CM | POA: Insufficient documentation

## 2019-11-25 DIAGNOSIS — Z170 Estrogen receptor positive status [ER+]: Secondary | ICD-10-CM

## 2019-11-25 DIAGNOSIS — C50812 Malignant neoplasm of overlapping sites of left female breast: Secondary | ICD-10-CM | POA: Insufficient documentation

## 2019-11-25 DIAGNOSIS — Z79811 Long term (current) use of aromatase inhibitors: Secondary | ICD-10-CM | POA: Insufficient documentation

## 2019-11-25 DIAGNOSIS — Z808 Family history of malignant neoplasm of other organs or systems: Secondary | ICD-10-CM | POA: Insufficient documentation

## 2019-11-25 LAB — CBC WITH DIFFERENTIAL/PLATELET
Abs Immature Granulocytes: 0.07 10*3/uL (ref 0.00–0.07)
Basophils Absolute: 0.2 10*3/uL — ABNORMAL HIGH (ref 0.0–0.1)
Basophils Relative: 1 %
Eosinophils Absolute: 0.4 10*3/uL (ref 0.0–0.5)
Eosinophils Relative: 3 %
HCT: 43.4 % (ref 36.0–46.0)
Hemoglobin: 13.7 g/dL (ref 12.0–15.0)
Immature Granulocytes: 1 %
Lymphocytes Relative: 21 %
Lymphs Abs: 3.1 10*3/uL (ref 0.7–4.0)
MCH: 27.2 pg (ref 26.0–34.0)
MCHC: 31.6 g/dL (ref 30.0–36.0)
MCV: 86.3 fL (ref 80.0–100.0)
Monocytes Absolute: 0.7 10*3/uL (ref 0.1–1.0)
Monocytes Relative: 5 %
Neutro Abs: 10.5 10*3/uL — ABNORMAL HIGH (ref 1.7–7.7)
Neutrophils Relative %: 69 %
Platelets: 368 10*3/uL (ref 150–400)
RBC: 5.03 MIL/uL (ref 3.87–5.11)
RDW: 14 % (ref 11.5–15.5)
WBC: 14.9 10*3/uL — ABNORMAL HIGH (ref 4.0–10.5)
nRBC: 0 % (ref 0.0–0.2)

## 2019-11-25 NOTE — Telephone Encounter (Signed)
Scheduled appts per 11/22 los. Gave pt a print out of AVS.  

## 2019-11-27 DIAGNOSIS — R197 Diarrhea, unspecified: Secondary | ICD-10-CM | POA: Diagnosis not present

## 2019-12-01 ENCOUNTER — Other Ambulatory Visit: Payer: Medicare Other

## 2019-12-06 LAB — C. DIFFICILE GDH AND TOXIN A/B
GDH ANTIGEN: NOT DETECTED
MICRO NUMBER:: 11246362
SPECIMEN QUALITY:: ADEQUATE
TOXIN A AND B: NOT DETECTED

## 2019-12-06 LAB — GASTROINTESTINAL PATHOGEN PANEL PCR

## 2019-12-09 DIAGNOSIS — G4733 Obstructive sleep apnea (adult) (pediatric): Secondary | ICD-10-CM | POA: Diagnosis not present

## 2019-12-12 ENCOUNTER — Other Ambulatory Visit (INDEPENDENT_AMBULATORY_CARE_PROVIDER_SITE_OTHER): Payer: Self-pay | Admitting: Gastroenterology

## 2019-12-12 DIAGNOSIS — R197 Diarrhea, unspecified: Secondary | ICD-10-CM

## 2019-12-12 NOTE — Progress Notes (Signed)
g

## 2019-12-13 NOTE — Patient Instructions (Signed)
HABIBA TRELOAR  12/13/2019     @PREFPERIOPPHARMACY @   Your procedure is scheduled on   12/18/2019  Report to [facility] at  0900  A.M.  Call this number if you have problems the morning of surgery:  [phone]   Remember:  Follow the diet and prep instructions given to you by the office                        Take these medicines the morning of surgery with A SIP OF WATER  Zyrtec, cymbalta, gabapentin, femara.    Do not wear jewelry, make-up or nail polish.  Do not wear lotions, powders, or perfumes. Please wear deodorant and brush your teeth.  Do not shave 48 hours prior to surgery.  Men may shave face and neck.  Do not bring valuables to the hospital.  [facility] is not responsible for any belongings or valuables.  Contacts, dentures or bridgework may not be worn into surgery.  Leave your suitcase in the car.  After surgery it may be brought to your room.  For patients admitted to the hospital, discharge time will be determined by your treatment team.  Patients discharged the day of surgery will not be allowed to drive home.   Name and phone number of your driver:   family Special instructions:  DO NOT smoke the morning of your procedure.  Please read over the following fact sheets that you were given. Anesthesia Post-op Instructions and Care and Recovery After Surgery       Colonoscopy, Adult, Care After This sheet gives you information about how to care for yourself after your procedure. Your health care provider may also give you more specific instructions. If you have problems or questions, contact your health care provider. What can I expect after the procedure? After the procedure, it is common to have:  A small amount of blood in your stool for 24 hours after the procedure.  Some gas.  Mild cramping or bloating of your abdomen. Follow these instructions at home: Eating and drinking   Drink enough fluid to keep your urine pale yellow.  Follow  instructions from your health care provider about eating or drinking restrictions.  Resume your normal diet as instructed by your health care provider. Avoid heavy or fried foods that are hard to digest. Activity  Rest as told by your health care provider.  Avoid sitting for a long time without moving. Get up to take short walks every 1-2 hours. This is important to improve blood flow and breathing. Ask for help if you feel weak or unsteady.  Return to your normal activities as told by your health care provider. Ask your health care provider what activities are safe for you. Managing cramping and bloating   Try walking around when you have cramps or feel bloated.  Apply heat to your abdomen as told by your health care provider. Use the heat source that your health care provider recommends, such as a moist heat pack or a heating pad. ? Place a towel between your skin and the heat source. ? Leave the heat on for 20-30 minutes. ? Remove the heat if your skin turns bright red. This is especially important if you are unable to feel pain, heat, or cold. You may have a greater risk of getting burned. General instructions  For the first 24 hours after the procedure: ? Do not drive or use machinery. ?  Do not sign important documents. ? Do not drink alcohol. ? Do your regular daily activities at a slower pace than normal. ? Eat soft foods that are easy to digest.  Take over-the-counter and prescription medicines only as told by your health care provider.  Keep all follow-up visits as told by your health care provider. This is important. Contact a health care provider if:  You have blood in your stool 2-3 days after the procedure. Get help right away if you have:  More than a small spotting of blood in your stool.  Large blood clots in your stool.  Swelling of your abdomen.  Nausea or vomiting.  A fever.  Increasing pain in your abdomen that is not relieved with  medicine. Summary  After the procedure, it is common to have a small amount of blood in your stool. You may also have mild cramping and bloating of your abdomen.  For the first 24 hours after the procedure, do not drive or use machinery, sign important documents, or drink alcohol.  Get help right away if you have a lot of blood in your stool, nausea or vomiting, a fever, or increased pain in your abdomen. This information is not intended to replace advice given to you by your health care provider. Make sure you discuss any questions you have with your health care provider. Document Revised: 07/16/2018 Document Reviewed: 07/16/2018 Elsevier Patient Education  [place] After These instructions provide you with information about caring for yourself after your procedure. Your health care provider may also give you more specific instructions. Your treatment has been planned according to current medical practices, but problems sometimes occur. Call your health care provider if you have any problems or questions after your procedure. What can I expect after the procedure? After your procedure, you may:  Feel sleepy for several hours.  Feel clumsy and have poor balance for several hours.  Feel forgetful about what happened after the procedure.  Have poor judgment for several hours.  Feel nauseous or vomit.  Have a sore throat if you had a breathing tube during the procedure. Follow these instructions at home: For at least 24 hours after the procedure:      Have a responsible adult stay with you. It is important to have someone help care for you until you are awake and alert.  Rest as needed.  Do not: ? Participate in activities in which you could fall or become injured. ? Drive. ? Use heavy machinery. ? Drink alcohol. ? Take sleeping pills or medicines that cause drowsiness. ? Make important decisions or sign legal documents. ? Take care  of children on your own. Eating and drinking  Follow the diet that is recommended by your health care provider.  If you vomit, drink water, juice, or soup when you can drink without vomiting.  Make sure you have little or no nausea before eating solid foods. General instructions  Take over-the-counter and prescription medicines only as told by your health care provider.  If you have sleep apnea, surgery and certain medicines can increase your risk for breathing problems. Follow instructions from your health care provider about wearing your sleep device: ? Anytime you are sleeping, including during daytime naps. ? While taking prescription pain medicines, sleeping medicines, or medicines that make you drowsy.  If you smoke, do not smoke without supervision.  Keep all follow-up visits as told by your health care provider. This is important. Contact a health  care provider if:  You keep feeling nauseous or you keep vomiting.  You feel light-headed.  You develop a rash.  You have a fever. Get help right away if:  You have trouble breathing. Summary  For several hours after your procedure, you may feel sleepy and have poor judgment.  Have a responsible adult stay with you for at least 24 hours or until you are awake and alert. This information is not intended to replace advice given to you by your health care provider. Make sure you discuss any questions you have with your health care provider. Document Revised: 03/20/2017 Document Reviewed: 04/12/2015 Elsevier Patient Education  [place].

## 2019-12-16 ENCOUNTER — Telehealth (INDEPENDENT_AMBULATORY_CARE_PROVIDER_SITE_OTHER): Payer: Self-pay

## 2019-12-16 DIAGNOSIS — Z86010 Personal history of colonic polyps: Secondary | ICD-10-CM

## 2019-12-16 MED ORDER — NA SULFATE-K SULFATE-MG SULF 17.5-3.13-1.6 GM/177ML PO SOLN: 354.0000 mL | Freq: Once | ORAL | 0 refills | Status: AC

## 2019-12-16 NOTE — Telephone Encounter (Signed)
Lindsay Pacheco, CMA  

## 2019-12-17 ENCOUNTER — Other Ambulatory Visit: Payer: Self-pay

## 2019-12-17 ENCOUNTER — Encounter (HOSPITAL_COMMUNITY)
Admission: RE | Admit: 2019-12-17 | Discharge: 2019-12-17 | Disposition: A | Discharge status: Home / Self Care | Payer: Medicare Other | Source: Ambulatory Visit | Attending: Internal Medicine | Admitting: Internal Medicine

## 2019-12-17 ENCOUNTER — Other Ambulatory Visit (HOSPITAL_COMMUNITY)
Admission: RE | Admit: 2019-12-17 | Discharge: 2019-12-17 | Disposition: A | Discharge status: Home / Self Care | Payer: Medicare Other | Source: Ambulatory Visit | Attending: Internal Medicine | Admitting: Internal Medicine

## 2019-12-17 DIAGNOSIS — Z01818 Encounter for other preprocedural examination: Secondary | ICD-10-CM | POA: Insufficient documentation

## 2019-12-17 DIAGNOSIS — Z20822 Contact with and (suspected) exposure to covid-19: Secondary | ICD-10-CM | POA: Insufficient documentation

## 2019-12-17 DIAGNOSIS — Z86010 Personal history of colonic polyps: Secondary | ICD-10-CM | POA: Insufficient documentation

## 2019-12-17 LAB — BASIC METABOLIC PANEL
Anion gap: 10 (ref 5–15)
BUN: 10 mg/dL (ref 6–20)
CO2: 26 mmol/L (ref 22–32)
Calcium: 9.1 mg/dL (ref 8.9–10.3)
Chloride: 101 mmol/L (ref 98–111)
Creatinine, Ser: 0.73 mg/dL (ref 0.44–1.00)
GFR, Estimated: >60 mL/min (ref >60)
Glucose, Bld: 157 mg/dL — ABNORMAL HIGH (ref 70–99)
Potassium: 3.7 mmol/L (ref 3.5–5.1)
Sodium: 137 mmol/L (ref 135–145)

## 2019-12-17 LAB — SARS CORONAVIRUS 2 (TAT 6-24 HRS): SARS Coronavirus 2: NEGATIVE

## 2019-12-18 ENCOUNTER — Other Ambulatory Visit: Payer: Self-pay

## 2019-12-18 ENCOUNTER — Ambulatory Visit (HOSPITAL_COMMUNITY): Payer: Medicare Other | Admitting: Anesthesiology

## 2019-12-18 ENCOUNTER — Encounter (HOSPITAL_COMMUNITY): Payer: Self-pay | Admitting: Internal Medicine

## 2019-12-18 ENCOUNTER — Ambulatory Visit (HOSPITAL_COMMUNITY)
Admission: RE | Admit: 2019-12-18 | Discharge: 2019-12-18 | Disposition: A | Discharge status: Home / Self Care | Payer: Medicare Other | Attending: Internal Medicine | Admitting: Internal Medicine

## 2019-12-18 ENCOUNTER — Encounter (HOSPITAL_COMMUNITY)
Admission: RE | Disposition: A | Discharge status: Home / Self Care | Payer: Self-pay | Source: Home / Self Care | Attending: Internal Medicine

## 2019-12-18 DIAGNOSIS — Z7984 Long term (current) use of oral hypoglycemic drugs: Secondary | ICD-10-CM | POA: Diagnosis not present

## 2019-12-18 DIAGNOSIS — Z1211 Encounter for screening for malignant neoplasm of colon: Secondary | ICD-10-CM | POA: Diagnosis not present

## 2019-12-18 DIAGNOSIS — Z803 Family history of malignant neoplasm of breast: Secondary | ICD-10-CM | POA: Insufficient documentation

## 2019-12-18 DIAGNOSIS — Z7982 Long term (current) use of aspirin: Secondary | ICD-10-CM | POA: Insufficient documentation

## 2019-12-18 DIAGNOSIS — G4730 Sleep apnea, unspecified: Secondary | ICD-10-CM | POA: Diagnosis not present

## 2019-12-18 DIAGNOSIS — K6289 Other specified diseases of anus and rectum: Secondary | ICD-10-CM | POA: Diagnosis not present

## 2019-12-18 DIAGNOSIS — Z818 Family history of other mental and behavioral disorders: Secondary | ICD-10-CM | POA: Insufficient documentation

## 2019-12-18 DIAGNOSIS — Z853 Personal history of malignant neoplasm of breast: Secondary | ICD-10-CM | POA: Insufficient documentation

## 2019-12-18 DIAGNOSIS — K644 Residual hemorrhoidal skin tags: Secondary | ICD-10-CM | POA: Insufficient documentation

## 2019-12-18 DIAGNOSIS — Z882 Allergy status to sulfonamides status: Secondary | ICD-10-CM | POA: Insufficient documentation

## 2019-12-18 DIAGNOSIS — Z833 Family history of diabetes mellitus: Secondary | ICD-10-CM | POA: Insufficient documentation

## 2019-12-18 DIAGNOSIS — Z09 Encounter for follow-up examination after completed treatment for conditions other than malignant neoplasm: Secondary | ICD-10-CM

## 2019-12-18 DIAGNOSIS — Z86010 Personal history of colonic polyps: Secondary | ICD-10-CM | POA: Insufficient documentation

## 2019-12-18 DIAGNOSIS — Z8371 Family history of colonic polyps: Secondary | ICD-10-CM | POA: Insufficient documentation

## 2019-12-18 DIAGNOSIS — I10 Essential (primary) hypertension: Secondary | ICD-10-CM | POA: Insufficient documentation

## 2019-12-18 DIAGNOSIS — E119 Type 2 diabetes mellitus without complications: Secondary | ICD-10-CM | POA: Diagnosis not present

## 2019-12-18 DIAGNOSIS — K5730 Diverticulosis of large intestine without perforation or abscess without bleeding: Secondary | ICD-10-CM | POA: Insufficient documentation

## 2019-12-18 DIAGNOSIS — K589 Irritable bowel syndrome without diarrhea: Secondary | ICD-10-CM | POA: Diagnosis not present

## 2019-12-18 DIAGNOSIS — Z79899 Other long term (current) drug therapy: Secondary | ICD-10-CM | POA: Insufficient documentation

## 2019-12-18 DIAGNOSIS — Z923 Personal history of irradiation: Secondary | ICD-10-CM | POA: Insufficient documentation

## 2019-12-18 HISTORY — PX: COLONOSCOPY WITH PROPOFOL: SHX5780

## 2019-12-18 HISTORY — PX: BIOPSY: SHX5522

## 2019-12-18 LAB — HM COLONOSCOPY

## 2019-12-18 LAB — GLUCOSE, CAPILLARY
Glucose-Capillary: 117 mg/dL — ABNORMAL HIGH (ref 70–99)
Glucose-Capillary: 83 mg/dL (ref 70–99)

## 2019-12-18 SURGERY — COLONOSCOPY WITH PROPOFOL
Anesthesia: General

## 2019-12-18 MED ORDER — PROPOFOL 500 MG/50ML IV EMUL
INTRAVENOUS | Status: DC | PRN
  Administered 2019-12-18: 150 ug/kg/min via INTRAVENOUS

## 2019-12-18 MED ORDER — LACTATED RINGERS IV SOLN: INTRAVENOUS | Status: DC

## 2019-12-18 MED ORDER — LIDOCAINE HCL (CARDIAC) PF 100 MG/5ML IV SOSY
PREFILLED_SYRINGE | INTRAVENOUS | Status: DC | PRN
  Administered 2019-12-18: 50 mg via INTRATRACHEAL

## 2019-12-18 MED ORDER — KETAMINE HCL 50 MG/5ML IJ SOSY
PREFILLED_SYRINGE | INTRAMUSCULAR | Status: AC
  Filled 2019-12-18: qty 5

## 2019-12-18 MED ORDER — LACTATED RINGERS IV SOLN: INTRAVENOUS | Status: DC | PRN

## 2019-12-18 MED ORDER — KETAMINE HCL 10 MG/ML IJ SOLN
INTRAMUSCULAR | Status: DC | PRN
  Administered 2019-12-18: 30 mg via INTRAVENOUS

## 2019-12-18 NOTE — H&P (Signed)
Lindsay Pacheco is an 59 y.o. female.   Chief Complaint: Patient is here for colonoscopy for HPI: Patient is 59 year old Caucasian female who underwent colonoscopy in December 2017 with removal of a tubular adenoma and a large polyp from rectosigmoid junction about 30 mm.  Sessile serrated polyp without any dysplasia.  She is advised to return for follow-up exam in 3 years.  She is 1 year late.  She complains of intermittent diarrhea and pain across her upper abdomen for the last 4 months.  Stool studies have been negative.  She does have history of IBS.  She was given dicyclomine but she does not use it on a regular basis.  She denies rectal bleeding. Her brother who is in his 67s had part of his colon removed for large polyp with high-grade dysplasia but no cancer.  He has local recurrence of polyp at the anastomosis  Past Medical History:  Diagnosis Date  . Anxiety   . Arthritis    "knees" (11/02/2015)  . Breast cancer ([place]) 2017   left breast   . Breast cancer, left breast ([place]) 09/30/2015  . Cataracts, bilateral    just watching right now  . Depression   . Diabetes mellitus, type II ([place])   . Diverticulitis 1987  . Hyperlipidemia   . Hypertension   . Irritable bowel syndrome (IBS)   . Personal history of radiation therapy   . Sleep apnea    does not use CPAP but "I'm suppose to" (11/02/2015)  . SVD (spontaneous vaginal delivery)    x 1    Past Surgical History:  Procedure Laterality Date  . [place] VITRECTOMY WITH 20 GAUGE MVR PORT FOR MACULAR HOLE Left 07/03/2018   Procedure: 25 GAUGE PARS PLANA VITRECTOMY WITH 20 GAUGE MVR PORT FOR MACULAR HOLE;  Surgeon: [name], MD;  Location: [place];  Service: Ophthalmology;  Laterality: Left;  . BREAST BIOPSY Left 09/2015  . BREAST LUMPECTOMY Left 11/02/2015  . BREAST LUMPECTOMY WITH AXILLARY LYMPH NODE BIOPSY Left 11/02/2015   BREAST LUMPECTOMY WITH BRACKETED RADIOACTIVE SEED AND LEFT AXILLARY SENTINEL LYMPH NODE  BIOPSY   . BREAST LUMPECTOMY WITH RADIOACTIVE SEED AND SENTINEL LYMPH NODE BIOPSY Left 11/02/2015   Procedure: LEFT BREAST LUMPECTOMY WITH BRACKETED RADIOACTIVE SEED AND LEFT AXILLARY SENTINEL LYMPH NODE BIOPSY;  Surgeon: [name], MD;  Location: [place];  Service: General;  Laterality: Left;  . COLONOSCOPY N/A 12/09/2015   Procedure: COLONOSCOPY;  Surgeon: [name], MD;  [facility];  Service: Endoscopy;  Laterality: N/A;  1:00  . EVACUATION BREAST HEMATOMA Left 11/02/2015  . EVACUATION BREAST HEMATOMA Left 11/02/2015   Procedure: EVACUATION HEMATOMA BREAST;  Surgeon: [name], MD;  Location: [place];  Service: General;  Laterality: Left;  Marland Kitchen GAS/FLUID EXCHANGE Left 07/03/2018   Procedure: Gas/Fluid Exchange;  Surgeon: [name], MD;  Location: [place];  Service: Ophthalmology;  Laterality: Left;  . JOINT REPLACEMENT    . KNEE ARTHROSCOPY Right 2000s  . LAPAROSCOPIC CHOLECYSTECTOMY  1995  . PHOTOCOAGULATION WITH LASER Left 07/03/2018   Procedure: Photocoagulation With Laser;  Surgeon: [name], MD;  Location: [place];  Service: Ophthalmology;  Laterality: Left;  . POLYPECTOMY  12/09/2015   Procedure: POLYPECTOMY; colon polyp  Surgeon: [name], MD;  [facility];  Service: Endoscopy;;  multiple  . TOTAL KNEE ARTHROPLASTY Right 2011  . WISDOM TOOTH EXTRACTION      Family History  Problem Relation Age of Onset  . Anxiety disorder Father   . Depression Father   . Other Father        Abestosis  . Depression Sister   . Diabetes Mother   . Breast cancer Paternal Aunt    Social History:  reports that she has never smoked. She has never used smokeless tobacco. She reports previous alcohol use. She reports previous drug use.  Allergies:  Allergies  Allergen Reactions  . Sulfa Antibiotics Nausea And Vomiting    Medications Prior to Admission  Medication Sig Dispense Refill  . acetaminophen  (TYLENOL) 500 MG tablet Take 1,000 mg by mouth daily as needed for headache.    Marland Kitchen aspirin 81 MG tablet Take 81 mg by mouth daily.    Marland Kitchen atorvastatin (LIPITOR) 10 MG tablet Take 1 tablet (10 mg total) by mouth daily. For high cholesterol 30 tablet 0  . bismuth subsalicylate (PEPTO BISMOL) 262 MG/15ML suspension Take 30 mLs by mouth every 6 (six) hours as needed for indigestion or diarrhea or loose stools.    . DULoxetine (CYMBALTA) 60 MG capsule Take 1 capsule (60 mg total) by mouth 2 (two) times daily. For mood control (Patient taking differently: Take 120 mg by mouth daily. For mood control) 60 capsule 0  . gabapentin (NEURONTIN) 300 MG capsule One capsule in the morning and 2 in the evening (Patient taking differently: Take 300-600 mg by mouth See admin instructions. One capsule (300 mg) in the morning and 2 (600 mg) in the evening) 270 capsule 1  . glimepiride (AMARYL) 2 MG tablet Take 2 mg by mouth daily with breakfast.    . letrozole (FEMARA) 2.5 MG tablet TAKE ONE TABLET BY MOUTH EVERY DAY (Patient taking differently: Take 2.5 mg by mouth daily.) 90 tablet 3  . lisinopril (PRINIVIL,ZESTRIL) 20 MG tablet Take 1 tablet (20 mg total) by mouth daily. For high blood pressure 30 tablet 0  . metFORMIN (GLUCOPHAGE) 500 MG tablet Take 1,000 mg by mouth 2 (two) times daily with a meal.     . traZODone (DESYREL) 50 MG tablet Take 1 tablet (50 mg total) by mouth at bedtime as needed for sleep. 30 tablet 0  . calcium carbonate (TUMS - DOSED IN MG ELEMENTAL CALCIUM) 500 MG chewable tablet Chew 2 tablets by mouth daily as needed for indigestion or heartburn.    . cetirizine (ZYRTEC) 10 MG tablet Take 10 mg by mouth daily as needed for allergies.     Marland Kitchen dicyclomine (BENTYL) 10 MG capsule Take 1 capsule (10 mg total) by mouth 3 (three) times daily as needed (diarrhea). 60 capsule 1    Results for orders placed or performed during the hospital encounter of 12/18/19 (from the past 48 hour(s))  Glucose, capillary      Status: Abnormal   Collection Time: 12/18/19  9:59 AM  Result Value Ref Range   Glucose-Capillary 117 (H) 70 - 99 mg/dL    Comment: Glucose reference range applies only to samples taken after fasting for at least 8 hours.   No results found.  Review of Systems  Blood pressure 125/76, pulse 94, temperature 97.9 F (36.6 C), resp. rate 13, SpO2 98 %. Physical Exam HENT:     Mouth/Throat:     Mouth: Mucous membranes are moist.     Pharynx: Oropharynx is clear.  Eyes:     Conjunctiva/sclera: Conjunctivae normal.  Cardiovascular:     Rate and Rhythm: Normal rate and regular rhythm.  Heart sounds: Normal heart sounds. No murmur heard.   Pulmonary:     Effort: Pulmonary effort is normal.     Breath sounds: Normal breath sounds.  Abdominal:     Comments: Abdomen is obese.  She has mild tenderness in midepigastric region.  No organomegaly or masses.  Musculoskeletal:     Comments: She has pitting and nonpitting edema to both ankles.  Lymphadenopathy:     Cervical: No cervical adenopathy.  Skin:    General: Skin is warm and dry.  Neurological:     Mental Status: She is alert.      Assessment/Plan  History of advanced adenoma. History of diarrhea Surveillance colonoscopy.  Hildred Laser, MD 12/18/2019, 11:08 AM

## 2019-12-18 NOTE — Op Note (Signed)
[facility] Patient Name: Lindsay Pacheco Procedure Date: 12/18/2019 10:54 AM MRN: [id number] Date of Birth: Sep 05, 1960 Attending MD: Hildred Laser , MD CSN: [id number] Age: 59 Admit Type: Outpatient Procedure:                Colonoscopy Indications:              High risk colon cancer surveillance: Personal                            history of colonic polyps Providers:                Hildred Laser, MD, Otis Peak B. Sharon Seller, RN, Dereck Leep, Technician, Nelma Rothman, Technician Referring MD:             Monico Blitz, MD Medicines:                Propofol per Anesthesia Complications:            No immediate complications. Estimated Blood Loss:     Estimated blood loss was minimal. Procedure:                Pre-Anesthesia Assessment:                           - Prior to the procedure, a History and Physical                            was performed, and patient medications and                            allergies were reviewed. The patient's tolerance of                            previous anesthesia was also reviewed. The risks                            and benefits of the procedure and the sedation                            options and risks were discussed with the patient.                            All questions were answered, and informed consent                            was obtained. Prior Anticoagulants: The patient has                            taken no previous anticoagulant or antiplatelet                            agents except for aspirin. ASA Grade Assessment:  III - A patient with severe systemic disease. After                            reviewing the risks and benefits, the patient was                            deemed in satisfactory condition to undergo the                            procedure.                           After obtaining informed consent, the colonoscope                            was  passed under direct vision. Throughout the                            procedure, the patient's blood pressure, pulse, and                            oxygen saturations were monitored continuously. The                            [id number]-[id number] ([id number]) scope was introduced through                            the anus and advanced to the the cecum, identified                            by appendiceal orifice and ileocecal valve. The                            colonoscopy was performed without difficulty. The                            patient tolerated the procedure well. The quality                            of the bowel preparation was good. The ileocecal                            valve, appendiceal orifice, and rectum were                            photographed. Scope In: 11:24:25 AM Scope Out: 11:38:50 AM Scope Withdrawal Time: 0 hours 8 minutes 39 seconds  Total Procedure Duration: 0 hours 14 minutes 25 seconds  Findings:      The perianal and digital rectal examinations were normal.      Multiple small and large-mouthed diverticula were found in the entire       colon.      The exam was otherwise normal throughout the examined colon.      There is no endoscopic evidence of inflammation in the entire colon.  Biopsies for histology were taken with a cold forceps from the ascending       colon and sigmoid colon for evaluation of microscopic colitis.      External hemorrhoids were found during retroflexion. The hemorrhoids       were medium-sized. Impression:               - Diverticulosis in the entire examined colon.                           - External hemorrhoids. Moderate Sedation:      Per Anesthesia Care Recommendation:           - Patient has a contact number available for                            emergencies. The signs and symptoms of potential                            delayed complications were discussed with the                            patient. Return to normal  activities tomorrow.                            Written discharge instructions were provided to the                            patient.                           - Resume previous diet today.                           - Continue present medications.                           - Resume antiplatelet medication at prior dose                            tomorrow.                           - Await pathology results.                           - Repeat colonoscopy in 5 years for surveillance. Procedure Code(s):        --- Professional ---                           [phone], Colonoscopy, flexible; with biopsy, single                            or multiple Diagnosis Code(s):        --- Professional ---                           Z86.010, Personal history of colonic polyps  K64.4, Residual hemorrhoidal skin tags                           K57.30, Diverticulosis of large intestine without                            perforation or abscess without bleeding CPT copyright 2019 American Medical Association. All rights reserved. The codes documented in this report are preliminary and upon coder review may  be revised to meet current compliance requirements. Hildred Laser, MD Hildred Laser, MD 12/18/2019 11:48:41 AM This report has been signed electronically. Number of Addenda: 0

## 2019-12-18 NOTE — Anesthesia Preprocedure Evaluation (Signed)
Anesthesia Evaluation  Patient identified by MRN, date of birth, ID band Patient awake    Reviewed: Allergy & Precautions, H&P , NPO status , Patient's Chart, lab work & pertinent test results, reviewed documented beta blocker date and time   Airway Mallampati: III  TM Distance: >3 FB Neck ROM: full    Dental no notable dental hx. (+) Teeth Intact   Pulmonary sleep apnea ,    Pulmonary exam normal breath sounds clear to auscultation       Cardiovascular Exercise Tolerance: Good hypertension, negative cardio ROS   Rhythm:regular Rate:Normal     Neuro/Psych PSYCHIATRIC DISORDERS Anxiety Depression negative neurological ROS     GI/Hepatic negative GI ROS, Neg liver ROS,   Endo/Other  negative endocrine ROSdiabetes  Renal/GU negative Renal ROS  negative genitourinary   Musculoskeletal   Abdominal   Peds  Hematology negative hematology ROS (+)   Anesthesia Other Findings   Reproductive/Obstetrics negative OB ROS                             Anesthesia Physical Anesthesia Plan  ASA: III  Anesthesia Plan: General   Post-op Pain Management:    Induction:   PONV Risk Score and Plan: Propofol infusion  Airway Management Planned:   Additional Equipment:   Intra-op Plan:   Post-operative Plan:   Informed Consent: I have reviewed the patients History and Physical, chart, labs and discussed the procedure including the risks, benefits and alternatives for the proposed anesthesia with the patient or authorized representative who has indicated his/her understanding and acceptance.     Dental Advisory Given  Plan Discussed with: CRNA  Anesthesia Plan Comments:         Anesthesia Quick Evaluation

## 2019-12-18 NOTE — Anesthesia Postprocedure Evaluation (Signed)
Anesthesia Post Note  Patient: Lindsay Pacheco  Procedure(s) Performed: COLONOSCOPY WITH PROPOFOL (N/A ) BIOPSY  Patient location during evaluation: PACU Anesthesia Type: General Level of consciousness: awake and alert Pain management: pain level controlled Vital Signs Assessment: post-procedure vital signs reviewed and stable Respiratory status: spontaneous breathing Cardiovascular status: stable Postop Assessment: no apparent nausea or vomiting Anesthetic complications: no   No complications documented.   Last Vitals:  Vitals:   12/18/19 1217 12/18/19 1218  BP:  132/88  Pulse: 80   Resp: 18   Temp: 36.5 C   SpO2: 100%     Last Pain:  Vitals:   12/18/19 1217  PainSc: 0-No pain                 Dainelle Hun Hristova

## 2019-12-18 NOTE — Discharge Instructions (Signed)
Resume aspirin on 12/19/2019. Resume other medications as before. Please take dicyclomine on schedule.  Take 10 mg before each meal for at least 2 doses.  Before breakfast and lunch daily Resume usual diet. No driving for 24 hours. Physician will call with biopsy results Colonoscopy in 5 years    Colonoscopy, Adult, Care After This sheet gives you information about how to care for yourself after your procedure. Your doctor may also give you more specific instructions. If you have problems or questions, call your doctor. What can I expect after the procedure? After the procedure, it is common to have:  A small amount of blood in your poop (stool) for 24 hours.  Some gas.  Mild cramping or bloating in your belly (abdomen). Follow these instructions at home: Eating and drinking   Drink enough fluid to keep your pee (urine) pale yellow.  Follow instructions from your doctor about what you cannot eat or drink.  Return to your normal diet as told by your doctor. Avoid heavy or fried foods that are hard to digest. Activity  Rest as told by your doctor.  Do not sit for a long time without moving. Get up to take short walks every 1-2 hours. This is important. Ask for help if you feel weak or unsteady.  Return to your normal activities as told by your doctor. Ask your doctor what activities are safe for you. To help cramping and bloating:   Try walking around.  Put heat on your belly as told by your doctor. Use the heat source that your doctor recommends, such as a moist heat pack or a heating pad. ? Put a towel between your skin and the heat source. ? Leave the heat on for 20-30 minutes. ? Remove the heat if your skin turns bright red. This is very important if you are unable to feel pain, heat, or cold. You may have a greater risk of getting burned. General instructions  For the first 24 hours after the procedure: ? Do not drive or use machinery. ? Do not sign important  documents. ? Do not drink alcohol. ? Do your daily activities more slowly than normal. ? Eat foods that are soft and easy to digest.  Take over-the-counter or prescription medicines only as told by your doctor.  Keep all follow-up visits as told by your doctor. This is important. Contact a doctor if:  You have blood in your poop 2-3 days after the procedure. Get help right away if:  You have more than a small amount of blood in your poop.  You see large clumps of tissue (blood clots) in your poop.  Your belly is swollen.  You feel like you may vomit (nauseous).  You vomit.  You have a fever.  You have belly pain that gets worse, and medicine does not help your pain. Summary  After the procedure, it is common to have a small amount of blood in your poop. You may also have mild cramping and bloating in your belly.  For the first 24 hours after the procedure, do not drive or use machinery, do not sign important documents, and do not drink alcohol.  Get help right away if you have a lot of blood in your poop, feel like you may vomit, have a fever, or have more belly pain. This information is not intended to replace advice given to you by your health care provider. Make sure you discuss any questions you have with your health care provider.  Document Revised: 07/16/2018 Document Reviewed: 07/16/2018 Elsevier Patient Education  [place] After These instructions provide you with information about caring for yourself after your procedure. Your health care provider may also give you more specific instructions. Your treatment has been planned according to current medical practices, but problems sometimes occur. Call your health care provider if you have any problems or questions after your procedure. What can I expect after the procedure? After your procedure, you may:  Feel sleepy for several hours.  Feel clumsy and have poor balance  for several hours.  Feel forgetful about what happened after the procedure.  Have poor judgment for several hours.  Feel nauseous or vomit.  Have a sore throat if you had a breathing tube during the procedure. Follow these instructions at home: For at least 24 hours after the procedure:      Have a responsible adult stay with you. It is important to have someone help care for you until you are awake and alert.  Rest as needed.  Do not: ? Participate in activities in which you could fall or become injured. ? Drive. ? Use heavy machinery. ? Drink alcohol. ? Take sleeping pills or medicines that cause drowsiness. ? Make important decisions or sign legal documents. ? Take care of children on your own. Eating and drinking  Follow the diet that is recommended by your health care provider.  If you vomit, drink water, juice, or soup when you can drink without vomiting.  Make sure you have little or no nausea before eating solid foods. General instructions  Take over-the-counter and prescription medicines only as told by your health care provider.  If you have sleep apnea, surgery and certain medicines can increase your risk for breathing problems. Follow instructions from your health care provider about wearing your sleep device: ? Anytime you are sleeping, including during daytime naps. ? While taking prescription pain medicines, sleeping medicines, or medicines that make you drowsy.  If you smoke, do not smoke without supervision.  Keep all follow-up visits as told by your health care provider. This is important. Contact a health care provider if:  You keep feeling nauseous or you keep vomiting.  You feel light-headed.  You develop a rash.  You have a fever. Get help right away if:  You have trouble breathing. Summary  For several hours after your procedure, you may feel sleepy and have poor judgment.  Have a responsible adult stay with you for at least 24 hours  or until you are awake and alert. This information is not intended to replace advice given to you by your health care provider. Make sure you discuss any questions you have with your health care provider. Document Revised: 03/20/2017 Document Reviewed: 04/12/2015 Elsevier Patient Education  [place].    Hemorrhoids Hemorrhoids are swollen veins in and around the rectum or anus. There are two types of hemorrhoids:  Internal hemorrhoids. These occur in the veins that are just inside the rectum. They may poke through to the outside and become irritated and painful.  External hemorrhoids. These occur in the veins that are outside the anus and can be felt as a painful swelling or hard lump near the anus. Most hemorrhoids do not cause serious problems, and they can be managed with home treatments such as diet and lifestyle changes. If home treatments do not help the symptoms, procedures can be done to shrink or remove the hemorrhoids. What  are the causes? This condition is caused by increased pressure in the anal area. This pressure may result from various things, including:  Constipation.  Straining to have a bowel movement.  Diarrhea.  Pregnancy.  Obesity.  Sitting for long periods of time.  Heavy lifting or other activity that causes you to strain.  Anal sex.  Riding a bike for a long period of time. What are the signs or symptoms? Symptoms of this condition include:  Pain.  Anal itching or irritation.  Rectal bleeding.  Leakage of stool (feces).  Anal swelling.  One or more lumps around the anus. How is this diagnosed? This condition can often be diagnosed through a visual exam. Other exams or tests may also be done, such as:  An exam that involves feeling the rectal area with a gloved hand (digital rectal exam).  An exam of the anal canal that is done using a small tube (anoscope).  A blood test, if you have lost a significant amount of blood.  A test  to look inside the colon using a flexible tube with a camera on the end (sigmoidoscopy or colonoscopy). How is this treated? This condition can usually be treated at home. However, various procedures may be done if dietary changes, lifestyle changes, and other home treatments do not help your symptoms. These procedures can help make the hemorrhoids smaller or remove them completely. Some of these procedures involve surgery, and others do not. Common procedures include:  Rubber band ligation. Rubber bands are placed at the base of the hemorrhoids to cut off their blood supply.  Sclerotherapy. Medicine is injected into the hemorrhoids to shrink them.  Infrared coagulation. A type of light energy is used to get rid of the hemorrhoids.  Hemorrhoidectomy surgery. The hemorrhoids are surgically removed, and the veins that supply them are tied off.  Stapled hemorrhoidopexy surgery. The surgeon staples the base of the hemorrhoid to the rectal wall. Follow these instructions at home: Eating and drinking   Eat foods that have a lot of fiber in them, such as whole grains, beans, nuts, fruits, and vegetables.  Ask your health care provider about taking products that have added fiber (fiber supplements).  Reduce the amount of fat in your diet. You can do this by eating low-fat dairy products, eating less red meat, and avoiding processed foods.  Drink enough fluid to keep your urine pale yellow. Managing pain and swelling   Take warm sitz baths for 20 minutes, 3-4 times a day to ease pain and discomfort. You may do this in a bathtub or using a portable sitz bath that fits over the toilet.  If directed, apply ice to the affected area. Using ice packs between sitz baths may be helpful. ? Put ice in a plastic bag. ? Place a towel between your skin and the bag. ? Leave the ice on for 20 minutes, 2-3 times a day. General instructions  Take over-the-counter and prescription medicines only as told by  your health care provider.  Use medicated creams or suppositories as told.  Get regular exercise. Ask your health care provider how much and what kind of exercise is best for you. In general, you should do moderate exercise for at least 30 minutes on most days of the week (150 minutes each week). This can include activities such as walking, biking, or yoga.  Go to the bathroom when you have the urge to have a bowel movement. Do not wait.  Avoid straining to have bowel  movements.  Keep the anal area dry and clean. Use wet toilet paper or moist towelettes after a bowel movement.  Do not sit on the toilet for long periods of time. This increases blood pooling and pain.  Keep all follow-up visits as told by your health care provider. This is important. Contact a health care provider if you have:  Increasing pain and swelling that are not controlled by treatment or medicine.  Difficulty having a bowel movement, or you are unable to have a bowel movement.  Pain or inflammation outside the area of the hemorrhoids. Get help right away if you have:  Uncontrolled bleeding from your rectum. Summary  Hemorrhoids are swollen veins in and around the rectum or anus.  Most hemorrhoids can be managed with home treatments such as diet and lifestyle changes.  Taking warm sitz baths can help ease pain and discomfort.  In severe cases, procedures or surgery can be done to shrink or remove the hemorrhoids. This information is not intended to replace advice given to you by your health care provider. Make sure you discuss any questions you have with your health care provider. Document Revised: 05/18/2018 Document Reviewed: 05/11/2017 Elsevier Patient Education  [place].     Diverticulosis  Diverticulosis is a condition that develops when small pouches (diverticula) form in the wall of the large intestine (colon). The colon is where water is absorbed and stool (feces) is formed. The  pouches form when the inside layer of the colon pushes through weak spots in the outer layers of the colon. You may have a few pouches or many of them. The pouches usually do not cause problems unless they become inflamed or infected. When this happens, the condition is called diverticulitis. What are the causes? The cause of this condition is not known. What increases the risk? The following factors may make you more likely to develop this condition:  Being older than age 79. Your risk for this condition increases with age. Diverticulosis is rare among people younger than age 24. By age 7, many people have it.  Eating a low-fiber diet.  Having frequent constipation.  Being overweight.  Not getting enough exercise.  Smoking.  Taking over-the-counter pain medicines, like aspirin and ibuprofen.  Having a family history of diverticulosis. What are the signs or symptoms? In most people, there are no symptoms of this condition. If you do have symptoms, they may include:  Bloating.  Cramps in the abdomen.  Constipation or diarrhea.  Pain in the lower left side of the abdomen. How is this diagnosed? Because diverticulosis usually has no symptoms, it is most often diagnosed during an exam for other colon problems. The condition may be diagnosed by:  Using a flexible scope to examine the colon (colonoscopy).  Taking an X-ray of the colon after dye has been put into the colon (barium enema).  Having a CT scan. How is this treated? You may not need treatment for this condition. Your health care provider may recommend treatment to prevent problems. You may need treatment if you have symptoms or if you previously had diverticulitis. Treatment may include:  Eating a high-fiber diet.  Taking a fiber supplement.  Taking a live bacteria supplement (probiotic).  Taking medicine to relax your colon. Follow these instructions at home: Medicines  Take over-the-counter and  prescription medicines only as told by your health care provider.  If told by your health care provider, take a fiber supplement or probiotic. Constipation prevention Your condition may  cause constipation. To prevent or treat constipation, you may need to:  Drink enough fluid to keep your urine pale yellow.  Take over-the-counter or prescription medicines.  Eat foods that are high in fiber, such as beans, whole grains, and fresh fruits and vegetables.  Limit foods that are high in fat and processed sugars, such as fried or sweet foods.  General instructions  Try not to strain when you have a bowel movement.  Keep all follow-up visits as told by your health care provider. This is important. Contact a health care provider if you:  Have pain in your abdomen.  Have bloating.  Have cramps.  Have not had a bowel movement in 3 days. Get help right away if:  Your pain gets worse.  Your bloating becomes very bad.  You have a fever or chills, and your symptoms suddenly get worse.  You vomit.  You have bowel movements that are bloody or black.  You have bleeding from your rectum. Summary  Diverticulosis is a condition that develops when small pouches (diverticula) form in the wall of the large intestine (colon).  You may have a few pouches or many of them.  This condition is most often diagnosed during an exam for other colon problems.  Treatment may include increasing the fiber in your diet, taking supplements, or taking medicines. This information is not intended to replace advice given to you by your health care provider. Make sure you discuss any questions you have with your health care provider. Document Revised: 07/19/2018 Document Reviewed: 07/19/2018 Elsevier Patient Education  [place].

## 2019-12-18 NOTE — Transfer of Care (Signed)
Immediate Anesthesia Transfer of Care Note  Patient: Lindsay Pacheco  Procedure(s) Performed: COLONOSCOPY WITH PROPOFOL (N/A ) BIOPSY  Patient Location: PACU  Anesthesia Type:General  Level of Consciousness: awake  Airway & Oxygen Therapy: Patient Spontanous Breathing  Post-op Assessment: Report given to RN and Post -op Vital signs reviewed and stable  Post vital signs: Reviewed and stable  Last Vitals:  Vitals Value Taken Time  BP    Temp    Pulse 97 12/18/19 1145  Resp 19 12/18/19 1145  SpO2 96 % 12/18/19 1145  Vitals shown include unvalidated device data.  Last Pain:  Vitals:   12/18/19 1117  PainSc: 2          Complications: No complications documented.

## 2019-12-19 LAB — SURGICAL PATHOLOGY

## 2019-12-23 ENCOUNTER — Ambulatory Visit: Payer: Medicare Other | Admitting: Pulmonary Disease

## 2019-12-25 ENCOUNTER — Encounter (HOSPITAL_COMMUNITY): Payer: Self-pay | Admitting: Internal Medicine

## 2019-12-25 ENCOUNTER — Encounter (INDEPENDENT_AMBULATORY_CARE_PROVIDER_SITE_OTHER): Payer: Self-pay | Admitting: *Deleted

## 2020-01-03 DIAGNOSIS — E7849 Other hyperlipidemia: Secondary | ICD-10-CM | POA: Diagnosis not present

## 2020-01-03 DIAGNOSIS — E1129 Type 2 diabetes mellitus with other diabetic kidney complication: Secondary | ICD-10-CM | POA: Diagnosis not present

## 2020-01-03 DIAGNOSIS — I10 Essential (primary) hypertension: Secondary | ICD-10-CM | POA: Diagnosis not present

## 2020-01-09 DIAGNOSIS — G4733 Obstructive sleep apnea (adult) (pediatric): Secondary | ICD-10-CM | POA: Diagnosis not present

## 2020-01-09 DIAGNOSIS — E1165 Type 2 diabetes mellitus with hyperglycemia: Secondary | ICD-10-CM | POA: Diagnosis not present

## 2020-01-09 DIAGNOSIS — Z789 Other specified health status: Secondary | ICD-10-CM | POA: Diagnosis not present

## 2020-01-09 DIAGNOSIS — Z299 Encounter for prophylactic measures, unspecified: Secondary | ICD-10-CM | POA: Diagnosis not present

## 2020-01-09 DIAGNOSIS — C50919 Malignant neoplasm of unspecified site of unspecified female breast: Secondary | ICD-10-CM | POA: Diagnosis not present

## 2020-01-09 DIAGNOSIS — I10 Essential (primary) hypertension: Secondary | ICD-10-CM | POA: Diagnosis not present

## 2020-01-17 ENCOUNTER — Telehealth: Payer: Self-pay | Admitting: Licensed Clinical Social Worker

## 2020-01-17 NOTE — Telephone Encounter (Signed)
Contacted patient to verify mychart video visit for pre reg 

## 2020-01-20 ENCOUNTER — Telehealth: Payer: Medicare Other | Admitting: Licensed Clinical Social Worker

## 2020-01-20 ENCOUNTER — Inpatient Hospital Stay: Payer: Medicare Other | Attending: Oncology | Admitting: Licensed Clinical Social Worker

## 2020-01-20 ENCOUNTER — Other Ambulatory Visit: Payer: Medicare Other

## 2020-01-20 ENCOUNTER — Encounter: Payer: Self-pay | Admitting: Licensed Clinical Social Worker

## 2020-01-20 DIAGNOSIS — E119 Type 2 diabetes mellitus without complications: Secondary | ICD-10-CM | POA: Insufficient documentation

## 2020-01-20 DIAGNOSIS — C50212 Malignant neoplasm of upper-inner quadrant of left female breast: Secondary | ICD-10-CM

## 2020-01-20 DIAGNOSIS — Z170 Estrogen receptor positive status [ER+]: Secondary | ICD-10-CM | POA: Diagnosis not present

## 2020-01-20 DIAGNOSIS — Z923 Personal history of irradiation: Secondary | ICD-10-CM | POA: Insufficient documentation

## 2020-01-20 DIAGNOSIS — Z8371 Family history of colonic polyps: Secondary | ICD-10-CM

## 2020-01-20 DIAGNOSIS — Z803 Family history of malignant neoplasm of breast: Secondary | ICD-10-CM | POA: Diagnosis not present

## 2020-01-20 DIAGNOSIS — C50812 Malignant neoplasm of overlapping sites of left female breast: Secondary | ICD-10-CM | POA: Insufficient documentation

## 2020-01-20 DIAGNOSIS — Z83719 Family history of colon polyps, unspecified: Secondary | ICD-10-CM

## 2020-01-20 NOTE — Progress Notes (Signed)
REFERRING PROVIDER: Chauncey Cruel, MD [address] [place],  [place] [address]  PRIMARY PROVIDER:  Monico Blitz, MD  PRIMARY REASON FOR VISIT:  1. Malignant neoplasm of upper-inner quadrant of left breast in female, estrogen receptor positive ([place])   2. Family history of breast cancer   3. Family history of polyps in the colon    I connected with Lindsay Pacheco on 01/20/2020 at 1:00 PM EDT by MyChart video conference and verified that I am speaking with the correct person using two identifiers.    Patient location: home Provider location: [place]:   Lindsay Pacheco, a 60 y.o. female, was seen for a [place] cancer genetics consultation at the request of Dr. Jana Hakim due to a family history of cancer.  Lindsay Pacheco presents to clinic today to discuss the possibility of a hereditary predisposition to cancer, genetic testing, and to further clarify her future cancer risks, as well as potential cancer risks for family members.   In 2017, at the age of 71, Lindsay Pacheco was diagnosed with invasive ductal carcinoma of the left breast, ER/PR+, Her2-. The treatment plan included lumpectomy, radiation and antiestrogen therapy.   CANCER HISTORY:  Oncology History  Malignant neoplasm of upper-inner quadrant of left breast in female, estrogen receptor positive ([place])  09/23/2015 Mammogram   Digital diagnostic mammogram and Ultrasound. Area of shadowing L breast on U/S at 12:00 1 cm from nipple corresponds to distortion on mammography. 3 adjacent masses in L breast at 11:00 could represent benign cysts, indeterminate. These are in the UI quadrant of L breast   09/30/2015 Initial Biopsy   Ultrasound guided biopsy at 12 o clock and 11 o clock position   09/30/2015 Pathology Results   12 o clock position invasive ductal carcinoma low grade, localized DCIS, cribriform type with calcifications. 11 o clock fibrotic and inflamed breast tissue DCIS  ER> 90%, PR > 90%, HER 2 IHC  2+. FISH negative   10/14/2015 Imaging   MRI breasts 2.2 cm irregular enhancing mass in the 12 o'clock region of the left breast corresponding with the biopsied low grade invasive ductal carcinoma and ductal carcinoma in-situ. 1.1 cm irregular enhancement in the 11 o'clock region of the left breast corresponding with the biopsied ductal carcinoma in-situ with fibrotic and inflamed breast tissue.   11/02/2015 Surgery   Procedure: 1. Left breast seed bracketed lumpectomy 2. Left axillary sentinel nodebiopsy Surgeon Dr Serita Grammes   11/02/2015 Pathology Results   Breast, lumpectomy, Left - INVASIVE DUCTAL CARCINOMA, GRADE I/III SPANNING AT LEAST 2.2 CM. Negative sentinel nodes X 3   12/22/2015 - 02/11/2016 Radiation Therapy   Adjuvant breast radiation [facility]): The Left breast was treated to 50.4 Gy in 28 fractions at 1.8 Gy per fraction.  Left breast was boosted to 10 Gy in 5 fractions at 2 Gy per fraction.      RISK FACTORS:  Menarche was at age 65.  First live birth at age 12.  Ovaries intact: yes.  Hysterectomy: no.  Menopausal status: postmenopausal.  HRT use: 0 years. Colonoscopy: yes; normal; has had a few polyps in past. Mammogram within the last year: yes. Number of breast biopsies: 1.   Past Medical History:  Diagnosis Date  . Anxiety   . Arthritis    "knees" (11/02/2015)  . Breast cancer ([place]) 2017   left breast   . Breast cancer, left breast ([place]) 09/30/2015  . Cataracts, bilateral    just watching right  now  . Depression   . Diabetes mellitus, type II ([place])   . Diverticulitis 1987  . Family history of breast cancer   . Family history of polyps in the colon   . Hyperlipidemia   . Hypertension   . Irritable bowel syndrome (IBS)   . Personal history of radiation therapy   . Sleep apnea    does not use CPAP but "I'm suppose to" (11/02/2015)  . SVD (spontaneous vaginal delivery)    x 1    Past Surgical History:  Procedure Laterality Date  . [place] VITRECTOMY WITH 20 GAUGE MVR PORT FOR MACULAR HOLE Left 07/03/2018   Procedure: 25 GAUGE PARS PLANA VITRECTOMY WITH 20 GAUGE MVR PORT FOR MACULAR HOLE;  Surgeon: [name], MD;  Location: [place];  Service: Ophthalmology;  Laterality: Left;  . BIOPSY  12/18/2019   Procedure: BIOPSY;  Surgeon: [name], MD;  [facility];  Service: Endoscopy;;  colon  . BREAST BIOPSY Left 09/2015  . BREAST LUMPECTOMY Left 11/02/2015  . BREAST LUMPECTOMY WITH AXILLARY LYMPH NODE BIOPSY Left 11/02/2015   BREAST LUMPECTOMY WITH BRACKETED RADIOACTIVE SEED AND LEFT AXILLARY SENTINEL LYMPH NODE BIOPSY   . BREAST LUMPECTOMY WITH RADIOACTIVE SEED AND SENTINEL LYMPH NODE BIOPSY Left 11/02/2015   Procedure: LEFT BREAST LUMPECTOMY WITH BRACKETED RADIOACTIVE SEED AND LEFT AXILLARY SENTINEL LYMPH NODE BIOPSY;  Surgeon: [name], MD;  Location: [place];  Service: General;  Laterality: Left;  . COLONOSCOPY N/A 12/09/2015   Procedure: COLONOSCOPY;  Surgeon: [name], MD;  [facility];  Service: Endoscopy;  Laterality: N/A;  1:00  . COLONOSCOPY WITH PROPOFOL N/A 12/18/2019   Procedure: COLONOSCOPY WITH PROPOFOL;  Surgeon: [name], MD;  [facility];  Service: Endoscopy;  Laterality: N/A;  1030  . EVACUATION BREAST HEMATOMA Left 11/02/2015  . EVACUATION BREAST HEMATOMA Left 11/02/2015   Procedure: EVACUATION HEMATOMA BREAST;  Surgeon: [name], MD;  Location: [place];  Service: General;  Laterality: Left;  Marland Kitchen GAS/FLUID EXCHANGE Left 07/03/2018   Procedure: Gas/Fluid Exchange;  Surgeon: [name], MD;  Location: [facility];  Service: Ophthalmology;  Laterality: Left;  . JOINT REPLACEMENT    . KNEE ARTHROSCOPY Right 2000s  . LAPAROSCOPIC CHOLECYSTECTOMY  1995  . PHOTOCOAGULATION WITH LASER Left 07/03/2018   Procedure: Photocoagulation With Laser;  Surgeon: [name], MD;  Location: [facility];   Service: Ophthalmology;  Laterality: Left;  . POLYPECTOMY  12/09/2015   Procedure: POLYPECTOMY; colon polyp  Surgeon: [name], MD;  [facility];  Service: Endoscopy;;  multiple  . TOTAL KNEE ARTHROPLASTY Right 2011  . WISDOM TOOTH EXTRACTION      Social History   Socioeconomic History  . Marital status: Divorced    Spouse name: Not on file  . Number of children: 1  . Years of education: Not on file  . Highest education level: Not on file  Occupational History  . Occupation: disabled/retired  Tobacco Use  . Smoking status: Never Smoker  . Smokeless tobacco: Never Used  Vaping Use  . Vaping Use: Never used  Substance and Sexual Activity  . Alcohol use: Not Currently    Comment: socially   . Drug use: Not Currently    Comment: last used in 2018  . Sexual activity: Not Currently    Birth control/protection: Post-menopausal  Other Topics Concern  . Not on file  Social History  Narrative   Lives Alone   Right Handed   Drinks 2 cups of caffeine daily   Social Determinants of Health   Financial Resource Strain: Not on file  Food Insecurity: Not on file  Transportation Needs: Not on file  Physical Activity: Not on file  Stress: Not on file  Social Connections: Not on file     FAMILY HISTORY:  We obtained a detailed, 4-generation family history.  Significant diagnoses are listed below: Family History  Problem Relation Age of Onset  . Anxiety disorder Father   . Depression Father   . Other Father        Abestosis  . Depression Sister   . Diabetes Mother   . Breast cancer Paternal Aunt    Ms. Yerby has 1 daughter. She has 1 brother and 1 sister. Her brother has had a partial colectomy due to the number of polyps, she reports there were so many it looked like a "shag carpet." He has not had genetic testing to her knowledge.   Lindsay Pacheco mother is living at 12 with no history of cancer. No history of cancer on this side of the family except for patient's  great aunt who had a tumor on her femur in her 61s.  Lindsay Pacheco father is living at 55. He may have had skin cancer, he has had lots of sun exposure. Patient had 1 paternal uncle, 2 paternal aunts. Her uncle had a spinal cord tumor in his 20s and died in his 38s of a recurrence. Her aunt had breast cancer in her 32s and died in her late 37s-50s of a recurrence. Her other aunt also had breast cancer, diagnosed in her 18s, and is living in her late 84s-70s.  Lindsay Pacheco is unaware of previous family history of genetic testing for hereditary cancer risks. Patient's maternal ancestors are of [place] descent, and paternal ancestors are of [place] descent. There is no reported Ashkenazi Jewish ancestry. There is no known consanguinity.    GENETIC COUNSELING ASSESSMENT: Lindsay Pacheco is a 60 y.o. female with a personal and family history which is somewhat suggestive of a hereditary cancer syndrome and predisposition to cancer. We, therefore, discussed and recommended the following at today's visit.   DISCUSSION: We discussed that approximately 5-10% of breast cancer is hereditary  Most cases of hereditary breast cancer are associated with BRCA1/BRCA2 genes, although there are other genes associated with hereditary breast cancer as well .  We discussed that testing is beneficial for several reasons including knowing about other cancer risks, identifying potential screening and risk-reduction options that may be appropriate, and to understand if other family members could be at risk for cancer and allow them to undergo genetic testing.   We reviewed the characteristics, features and inheritance patterns of hereditary cancer syndromes. We also discussed genetic testing, including the appropriate family members to test, the process of testing, insurance coverage and turn-around-time for results. We discussed the implications of a negative, positive and/or variant of uncertain significant result. We recommended  Lindsay Pacheco pursue genetic testing for the [facility] Multi-Cancer Panel +RNA gene panel.   The Multi-Cancer Panel offered by [facility] includes sequencing and/or deletion duplication testing of the following 84 genes: AIP, ALK, APC, ATM, AXIN2,BAP1,  BARD1, BLM, BMPR1A, BRCA1, BRCA2, BRIP1, CASR, CDC73, CDH1, CDK4, CDKN1B, CDKN1C, CDKN2A (p14ARF), CDKN2A (p16INK4a), CEBPA, CHEK2, CTNNA1, DICER1, DIS3L2, EGFR (c.2369C>T, p.Thr790Met variant only), EPCAM (Deletion/duplication testing only), FH, FLCN, GATA2, GPC3, GREM1 (Promoter region deletion/duplication testing only), HOXB13 (c.251G>A,  p.Gly84Glu), HRAS, KIT, MAX, MEN1, MET, MITF (c.952G>A, p.Glu318Lys variant only), MLH1, MSH2, MSH3, MSH6, MUTYH, NBN, NF1, NF2, NTHL1, PALB2, PDGFRA, PHOX2B, PMS2, POLD1, POLE, POT1, PRKAR1A, PTCH1, PTEN, RAD50, RAD51C, RAD51D, RB1, RECQL4, RET,  RUNX1, SDHAF2, SDHA (sequence changes only), SDHB, SDHC, SDHD, SMAD4, SMARCA4, SMARCB1, SMARCE1, STK11, SUFU, TERC, TERT, TMEM127, TP53, TSC1, TSC2, VHL, WRN and WT1.  Based on Lindsay Pacheco's personal and family history of cancer, she meets medical criteria for genetic testing. Despite that she meets criteria, she may still have an out of pocket cost.   PLAN: After considering the risks, benefits, and limitations, Lindsay Pacheco provided informed consent to pursue genetic testing and the blood sample was sent to [facility] for analysis of the Multi-Cancer Panel. Results should be available within approximately 2-3 weeks' time, at which point they will be disclosed by telephone to Lindsay Pacheco, as will any additional recommendations warranted by these results. Lindsay Pacheco will receive a summary of her genetic counseling visit and a copy of her results once available. This information will also be available in Epic.   Based on Lindsay Pacheco family history, we recommended her brother have genetic counseling and testing. Lindsay Pacheco will let us know if we can be of any assistance in coordinating genetic  counseling and/or testing for this family member.   Lindsay Pacheco questions were answered to her satisfaction today. Our contact information was provided should additional questions or concerns arise. Thank you for the referral and allowing [place] to share in the care of your patient.   Faith Rogue, MS, [facility] Genetic Counselor [email or web address] Phone: [phone]  The patient was seen for a total of 40 minutes in virtual genetic counseling. [employer] Intern Fredrik Rigger was also present and assisted with this case. Dr. Grayland Ormond was available for discussion regarding this case.   _______________________________________________________________________ For Office Staff:  Number of people involved in session: 2 Was an Intern/ student involved with case: yes

## 2020-01-21 ENCOUNTER — Inpatient Hospital Stay: Payer: Medicare Other

## 2020-01-21 ENCOUNTER — Other Ambulatory Visit: Payer: Self-pay

## 2020-01-21 DIAGNOSIS — Z170 Estrogen receptor positive status [ER+]: Secondary | ICD-10-CM | POA: Diagnosis not present

## 2020-01-21 DIAGNOSIS — Z8371 Family history of colonic polyps: Secondary | ICD-10-CM | POA: Diagnosis not present

## 2020-01-21 DIAGNOSIS — C50812 Malignant neoplasm of overlapping sites of left female breast: Secondary | ICD-10-CM | POA: Diagnosis not present

## 2020-01-21 DIAGNOSIS — Z923 Personal history of irradiation: Secondary | ICD-10-CM | POA: Diagnosis not present

## 2020-01-21 DIAGNOSIS — Z803 Family history of malignant neoplasm of breast: Secondary | ICD-10-CM

## 2020-01-21 DIAGNOSIS — C50212 Malignant neoplasm of upper-inner quadrant of left female breast: Secondary | ICD-10-CM | POA: Diagnosis not present

## 2020-01-21 DIAGNOSIS — E119 Type 2 diabetes mellitus without complications: Secondary | ICD-10-CM | POA: Diagnosis not present

## 2020-01-21 LAB — GENETIC SCREENING ORDER

## 2020-01-21 LAB — CBC WITH DIFFERENTIAL/PLATELET
Abs Immature Granulocytes: 0.04 10*3/uL (ref 0.00–0.07)
Basophils Absolute: 0.2 10*3/uL — ABNORMAL HIGH (ref 0.0–0.1)
Basophils Relative: 2 %
Eosinophils Absolute: 0.9 10*3/uL — ABNORMAL HIGH (ref 0.0–0.5)
Eosinophils Relative: 9 %
HCT: 39.8 % (ref 36.0–46.0)
Hemoglobin: 12.6 g/dL (ref 12.0–15.0)
Immature Granulocytes: 0 %
Lymphocytes Relative: 29 %
Lymphs Abs: 3 10*3/uL (ref 0.7–4.0)
MCH: 27.5 pg (ref 26.0–34.0)
MCHC: 31.7 g/dL (ref 30.0–36.0)
MCV: 86.7 fL (ref 80.0–100.0)
Monocytes Absolute: 0.7 10*3/uL (ref 0.1–1.0)
Monocytes Relative: 6 %
Neutro Abs: 5.7 10*3/uL (ref 1.7–7.7)
Neutrophils Relative %: 54 %
Platelets: 297 10*3/uL (ref 150–400)
RBC: 4.59 MIL/uL (ref 3.87–5.11)
RDW: 13.7 % (ref 11.5–15.5)
WBC: 10.4 10*3/uL (ref 4.0–10.5)
nRBC: 0 % (ref 0.0–0.2)

## 2020-02-09 DIAGNOSIS — G4733 Obstructive sleep apnea (adult) (pediatric): Secondary | ICD-10-CM | POA: Diagnosis not present

## 2020-02-10 ENCOUNTER — Encounter (INDEPENDENT_AMBULATORY_CARE_PROVIDER_SITE_OTHER): Payer: Medicare Other | Admitting: Ophthalmology

## 2020-02-11 ENCOUNTER — Telehealth: Payer: Self-pay | Admitting: Licensed Clinical Social Worker

## 2020-02-11 ENCOUNTER — Other Ambulatory Visit: Payer: Self-pay

## 2020-02-11 ENCOUNTER — Encounter: Payer: Self-pay | Admitting: Licensed Clinical Social Worker

## 2020-02-11 ENCOUNTER — Ambulatory Visit: Payer: Self-pay | Admitting: Licensed Clinical Social Worker

## 2020-02-11 ENCOUNTER — Encounter (INDEPENDENT_AMBULATORY_CARE_PROVIDER_SITE_OTHER): Payer: Medicare Other | Admitting: Ophthalmology

## 2020-02-11 DIAGNOSIS — H43811 Vitreous degeneration, right eye: Secondary | ICD-10-CM | POA: Diagnosis not present

## 2020-02-11 DIAGNOSIS — C50212 Malignant neoplasm of upper-inner quadrant of left female breast: Secondary | ICD-10-CM

## 2020-02-11 DIAGNOSIS — H35033 Hypertensive retinopathy, bilateral: Secondary | ICD-10-CM

## 2020-02-11 DIAGNOSIS — I10 Essential (primary) hypertension: Secondary | ICD-10-CM

## 2020-02-11 DIAGNOSIS — H35342 Macular cyst, hole, or pseudohole, left eye: Secondary | ICD-10-CM

## 2020-02-11 DIAGNOSIS — Z8371 Family history of colonic polyps: Secondary | ICD-10-CM

## 2020-02-11 DIAGNOSIS — Z1379 Encounter for other screening for genetic and chromosomal anomalies: Secondary | ICD-10-CM

## 2020-02-11 DIAGNOSIS — Z803 Family history of malignant neoplasm of breast: Secondary | ICD-10-CM

## 2020-02-11 NOTE — Progress Notes (Signed)
HPI:  [name] was previously seen in the [place] clinic due to a personal and family history of cancer and concerns regarding a hereditary predisposition to cancer. Please refer to our prior cancer genetics clinic note for more information regarding our discussion, assessment and recommendations, at the time. [name] recent genetic test results were disclosed to her, as were recommendations warranted by these results. These results and recommendations are discussed in more detail below.  CANCER HISTORY:  Oncology History  Malignant neoplasm of upper-inner quadrant of left breast in female, estrogen receptor positive ([place])  09/23/2015 Mammogram   Digital diagnostic mammogram and Ultrasound. Area of shadowing L breast on U/S at 12:00 1 cm from nipple corresponds to distortion on mammography. 3 adjacent masses in L breast at 11:00 could represent benign cysts, indeterminate. These are in the UI quadrant of L breast   09/30/2015 Initial Biopsy   Ultrasound guided biopsy at 12 o clock and 11 o clock position   09/30/2015 Pathology Results   12 o clock position invasive ductal carcinoma low grade, localized DCIS, cribriform type with calcifications. 11 o clock fibrotic and inflamed breast tissue DCIS  ER> 90%, PR > 90%, HER 2 IHC 2+. FISH negative   10/14/2015 Imaging   MRI breasts 2.2 cm irregular enhancing mass in the 12 o'clock region of the left breast corresponding with the biopsied low grade invasive ductal carcinoma and ductal carcinoma in-situ. 1.1 cm irregular enhancement in the 11 o'clock region of the left breast corresponding with the biopsied ductal carcinoma in-situ with fibrotic and inflamed breast tissue.   11/02/2015 Surgery   Procedure: 1. Left breast seed bracketed lumpectomy 2. Left axillary sentinel nodebiopsy Surgeon Dr Serita Grammes   11/02/2015 Pathology Results   Breast, lumpectomy, Left - INVASIVE DUCTAL CARCINOMA, GRADE I/III SPANNING AT LEAST  2.2 CM. Negative sentinel nodes X 3   12/22/2015 - 02/11/2016 Radiation Therapy   Adjuvant breast radiation [facility]): The Left breast was treated to 50.4 Gy in 28 fractions at 1.8 Gy per fraction.  Left breast was boosted to 10 Gy in 5 fractions at 2 Gy per fraction.     FAMILY HISTORY:  We obtained a detailed, 4-generation family history.  Significant diagnoses are listed below: Family History  Problem Relation Age of Onset  . Anxiety disorder Father   . Depression Father   . Other Father        Abestosis  . Depression Sister   . Diabetes Mother   . Breast cancer Paternal Aunt     [name] has 1 daughter. She has 1 brother and 1 sister. Her brother has had a partial colectomy due to the number of polyps, she reports there were so many it looked like a "shag carpet." He has not had genetic testing to her knowledge.   [name] mother is living at 75 with no history of cancer. No history of cancer on this side of the family except for patient's great aunt who had a tumor on her femur in her 68s.  [name] father is living at 70. He may have had skin cancer, he has had lots of sun exposure. Patient had 1 paternal uncle, 2 paternal aunts. Her uncle had a spinal cord tumor in his 60s and died in his 54s of a recurrence. Her aunt had breast cancer in her 59s and died in her late 92s-50s of a recurrence. Her other aunt also had breast cancer, diagnosed in her 82s, and is  living in her late 57s-70s.  [name] is unaware of previous family history of genetic testing for hereditary cancer risks. Patient's maternal ancestors are of [place] descent, and paternal ancestors are of [place] descent. There is no reported Ashkenazi Jewish ancestry. There is no known consanguinity.    GENETIC TEST RESULTS: Genetic testing reported out on 02/10/2020 through the [facility] Multi-Cancer Panel +RNA panel found no pathogenic mutations.   The Multi-Cancer Panel + RNA offered by [facility] includes  sequencing and/or deletion duplication testing of the following 84 genes: AIP, ALK, APC*, ATM*, AXIN2, BAP1, BARD1, BLM, BMPR1A, BRCA1, BRCA2, BRIP1, CASR, CDC73, CDH1, CDK4, CDKN1B, CDKN1C, CDKN2A (p14ARF), CDKN2A (p16INK4a), CEBPA, CHEK2, CTNNA1, DICER1*, DIS3L2, EGFR, EPCAM*, FH*, FLCN, GATA2, GPC3*, GREM1*, HOXB13, HRAS, KIT, MAX*, MEN1*, MET*, MITF*, MLH1*, MSH2*, MSH3*, MSH6*, MUTYH, NBN, NF1*, NF2, NTHL1, PALB2, PDGFRA, PHOX2B*, PMS2*, POLD1*, POLE, POT1, PRKAR1A, PTCH1, PTEN*, RAD50, RAD51C, RAD51D, RB1*, RECQL4*, RET, RUNX1, SDHA*, SDHAF2, SDHB, SDHC*, SDHD, SMAD4, SMARCA4, SMARCB1, SMARCE1, STK11, SUFU, TERC, TERT, TMEM127, TP53, TSC1*, TSC2, VHL, WRN*, WT1    RNA genes analyzed AIP, APC*, ATM*, AXIN2, BAP1, BARD1, BLM, BMPR1A, BRCA1, BRCA2, BRIP1, CDC73, CDH1, CDKN1B, CDKN1C, CHEK2, CTNNA1, DICER1*, DIS3L2, FH*, FLCN, GATA2, MAX*, MEN1*, MLH1*, MSH2*, MSH3*, MSH6*, MUTYH, NBN, NF1*, NF2, NTHL1, PALB2, PMS2*, POLD1*, POLE, POT1, PRKAR1A, PTCH1, PTEN*, RAD50, RAD51C, RAD51D, RB1*, RUNX1, SDHA*, SDHAF2, SDHB, SDHC*, SDHD, SMAD4, SMARCA4, SMARCB1, SMARCE1, STK11, SUFU, TMEM127, TP53, TSC1*, TSC2, VHL, WRN*  The test report has been scanned into EPIC and is located under the Molecular Pathology section of the Results Review tab.  A portion of the result report is included below for reference.     We discussed with [name] that because current genetic testing is not perfect, it is possible there may be a gene mutation in one of these genes that current testing cannot detect, but that chance is small.  We also discussed, that there could be another gene that has not yet been discovered, or that we have not yet tested, that is responsible for the cancer diagnoses in the family. It is also possible there is a hereditary cause for the cancer in the family that [name] did not inherit and therefore was not identified in her testing.  Therefore, it is important to remain in touch with cancer genetics in  the future so that we can continue to offer [name] the most up to date genetic testing.   Genetic testing did identify a variant of uncertain significance (VUS) in the MET gene called c.3865G>A.  At this time, it is unknown if this variant is associated with increased cancer risk or if this is a normal finding, but most variants such as this get reclassified to being inconsequential. It should not be used to make medical management decisions. With time, we suspect the lab will determine the significance of this variant, if any. If we do learn more about it, we will try to contact [name] to discuss it further. However, it is important to stay in touch with [place] periodically and keep the address and phone number up to date.  ADDITIONAL GENETIC TESTING: We discussed with [name] that her genetic testing was fairly extensive.  If there are genes identified to increase cancer risk that can be analyzed in the future, we would be happy to discuss and coordinate this testing at that time.    CANCER SCREENING RECOMMENDATIONS: [name]'s test result is considered negative (normal).  This means that we have  not identified a hereditary cause for her  personal and family history of cancer at this time. Most cancers happen by chance and this negative test suggests that her cancer may fall into this category.    While reassuring, this does not definitively rule out a hereditary predisposition to cancer. It is still possible that there could be genetic mutations that are undetectable by current technology. There could be genetic mutations in genes that have not been tested or identified to increase cancer risk.  Therefore, it is recommended she continue to follow the cancer management and screening guidelines provided by her oncology and primary healthcare provider.   An individual's cancer risk and medical management are not determined by genetic test results alone. Overall cancer risk assessment incorporates  additional factors, including personal medical history, family history, and any available genetic information that may result in a personalized plan for cancer prevention and surveillance.  RECOMMENDATIONS FOR FAMILY MEMBERS:  Relatives in this family might be at some increased risk of developing cancer, over the general population risk, simply due to the family history of cancer.  We recommended female relatives in this family have a yearly mammogram beginning at age 67, or 75 years younger than the earliest onset of cancer, an annual clinical breast exam, and perform monthly breast self-exams. Female relatives in this family should also have a gynecological exam as recommended by their primary provider.  All family members should be referred for colonoscopy starting at age 69.    It is also possible there is a hereditary cause for the cancer in [name] family that she did not inherit and therefore was not identified in her.  Based on [name]'s family history, we recommended paternal relatives, especially those related to her aunt who was diagnosed with breast cancer at a young age and her brother with polyps, have genetic counseling and testing. [name] will let us know if we can be of any assistance in coordinating genetic counseling and/or testing for these family members.  FOLLOW-UP: Lastly, we discussed with [name] that cancer genetics is a rapidly advancing field and it is possible that new genetic tests will be appropriate for her and/or her family members in the future. We encouraged her to remain in contact with cancer genetics on an annual basis so we can update her personal and family histories and let her know of advances in cancer genetics that may benefit this family.   Our contact number was provided. [name] questions were answered to her satisfaction, and she knows she is welcome to call us at anytime with additional questions or concerns.   Faith Rogue, MS, [facility] Genetic  Counselor [email or web address] Phone: [phone]

## 2020-02-11 NOTE — Telephone Encounter (Signed)
Revealed negative genetic testing.  Revealed that a VUS in MET was identified. This normal result is reassuring and indicates that it is unlikely Lindsay Pacheco's cancer is due to a hereditary cause.  It is unlikely that there is an increased risk of another cancer due to a mutation in one of these genes.  However, genetic testing is not perfect, and cannot definitively rule out a hereditary cause.  It will be important for her to keep in contact with genetics to learn if any additional testing may be needed in the future.

## 2020-02-18 ENCOUNTER — Ambulatory Visit: Payer: Medicare Other | Admitting: Neurology

## 2020-03-02 DIAGNOSIS — I10 Essential (primary) hypertension: Secondary | ICD-10-CM | POA: Diagnosis not present

## 2020-03-02 DIAGNOSIS — E119 Type 2 diabetes mellitus without complications: Secondary | ICD-10-CM | POA: Diagnosis not present

## 2020-03-23 DIAGNOSIS — E1165 Type 2 diabetes mellitus with hyperglycemia: Secondary | ICD-10-CM | POA: Diagnosis not present

## 2020-03-23 DIAGNOSIS — E1129 Type 2 diabetes mellitus with other diabetic kidney complication: Secondary | ICD-10-CM | POA: Diagnosis not present

## 2020-03-23 DIAGNOSIS — I10 Essential (primary) hypertension: Secondary | ICD-10-CM | POA: Diagnosis not present

## 2020-03-23 DIAGNOSIS — Z299 Encounter for prophylactic measures, unspecified: Secondary | ICD-10-CM | POA: Diagnosis not present

## 2020-03-23 DIAGNOSIS — R42 Dizziness and giddiness: Secondary | ICD-10-CM | POA: Diagnosis not present

## 2020-03-31 ENCOUNTER — Other Ambulatory Visit: Payer: Self-pay | Admitting: Neurology

## 2020-04-15 DIAGNOSIS — E1165 Type 2 diabetes mellitus with hyperglycemia: Secondary | ICD-10-CM | POA: Diagnosis not present

## 2020-04-15 DIAGNOSIS — I10 Essential (primary) hypertension: Secondary | ICD-10-CM | POA: Diagnosis not present

## 2020-04-15 DIAGNOSIS — G4733 Obstructive sleep apnea (adult) (pediatric): Secondary | ICD-10-CM | POA: Diagnosis not present

## 2020-04-15 DIAGNOSIS — Z299 Encounter for prophylactic measures, unspecified: Secondary | ICD-10-CM | POA: Diagnosis not present

## 2020-05-26 ENCOUNTER — Other Ambulatory Visit: Payer: Self-pay | Admitting: Oncology

## 2020-05-26 DIAGNOSIS — Z170 Estrogen receptor positive status [ER+]: Secondary | ICD-10-CM

## 2020-05-26 DIAGNOSIS — C50212 Malignant neoplasm of upper-inner quadrant of left female breast: Secondary | ICD-10-CM

## 2020-06-01 DIAGNOSIS — I10 Essential (primary) hypertension: Secondary | ICD-10-CM | POA: Diagnosis not present

## 2020-06-01 DIAGNOSIS — E1129 Type 2 diabetes mellitus with other diabetic kidney complication: Secondary | ICD-10-CM | POA: Diagnosis not present

## 2020-06-01 DIAGNOSIS — E7849 Other hyperlipidemia: Secondary | ICD-10-CM | POA: Diagnosis not present

## 2020-06-26 DIAGNOSIS — Z7189 Other specified counseling: Secondary | ICD-10-CM | POA: Diagnosis not present

## 2020-06-26 DIAGNOSIS — Z299 Encounter for prophylactic measures, unspecified: Secondary | ICD-10-CM | POA: Diagnosis not present

## 2020-06-26 DIAGNOSIS — Z0000 Encounter for general adult medical examination without abnormal findings: Secondary | ICD-10-CM | POA: Diagnosis not present

## 2020-06-26 DIAGNOSIS — I10 Essential (primary) hypertension: Secondary | ICD-10-CM | POA: Diagnosis not present

## 2020-06-29 DIAGNOSIS — R5383 Other fatigue: Secondary | ICD-10-CM | POA: Diagnosis not present

## 2020-06-29 DIAGNOSIS — Z79899 Other long term (current) drug therapy: Secondary | ICD-10-CM | POA: Diagnosis not present

## 2020-06-29 DIAGNOSIS — E7800 Pure hypercholesterolemia, unspecified: Secondary | ICD-10-CM | POA: Diagnosis not present

## 2020-07-21 DIAGNOSIS — I10 Essential (primary) hypertension: Secondary | ICD-10-CM | POA: Diagnosis not present

## 2020-07-21 DIAGNOSIS — E1165 Type 2 diabetes mellitus with hyperglycemia: Secondary | ICD-10-CM | POA: Diagnosis not present

## 2020-07-21 DIAGNOSIS — Z713 Dietary counseling and surveillance: Secondary | ICD-10-CM | POA: Diagnosis not present

## 2020-07-21 DIAGNOSIS — Z299 Encounter for prophylactic measures, unspecified: Secondary | ICD-10-CM | POA: Diagnosis not present

## 2020-10-05 ENCOUNTER — Other Ambulatory Visit: Payer: Self-pay | Admitting: Oncology

## 2020-10-05 DIAGNOSIS — C50212 Malignant neoplasm of upper-inner quadrant of left female breast: Secondary | ICD-10-CM

## 2020-10-05 DIAGNOSIS — Z170 Estrogen receptor positive status [ER+]: Secondary | ICD-10-CM

## 2020-10-20 ENCOUNTER — Other Ambulatory Visit: Payer: Self-pay | Admitting: Oncology

## 2020-10-20 ENCOUNTER — Other Ambulatory Visit: Payer: Self-pay | Admitting: *Deleted

## 2020-10-20 DIAGNOSIS — Z170 Estrogen receptor positive status [ER+]: Secondary | ICD-10-CM

## 2020-10-20 DIAGNOSIS — C50212 Malignant neoplasm of upper-inner quadrant of left female breast: Secondary | ICD-10-CM

## 2020-10-22 DIAGNOSIS — E119 Type 2 diabetes mellitus without complications: Secondary | ICD-10-CM | POA: Diagnosis not present

## 2020-10-22 DIAGNOSIS — H524 Presbyopia: Secondary | ICD-10-CM | POA: Diagnosis not present

## 2020-10-22 DIAGNOSIS — H2511 Age-related nuclear cataract, right eye: Secondary | ICD-10-CM | POA: Diagnosis not present

## 2020-10-22 DIAGNOSIS — H35033 Hypertensive retinopathy, bilateral: Secondary | ICD-10-CM | POA: Diagnosis not present

## 2020-10-22 DIAGNOSIS — H35342 Macular cyst, hole, or pseudohole, left eye: Secondary | ICD-10-CM | POA: Diagnosis not present

## 2020-10-22 LAB — HM DIABETES EYE EXAM

## 2020-10-29 DIAGNOSIS — Z23 Encounter for immunization: Secondary | ICD-10-CM | POA: Diagnosis not present

## 2020-11-11 DIAGNOSIS — J329 Chronic sinusitis, unspecified: Secondary | ICD-10-CM | POA: Diagnosis not present

## 2020-11-11 DIAGNOSIS — I10 Essential (primary) hypertension: Secondary | ICD-10-CM | POA: Diagnosis not present

## 2020-11-11 DIAGNOSIS — E7800 Pure hypercholesterolemia, unspecified: Secondary | ICD-10-CM | POA: Diagnosis not present

## 2020-11-11 DIAGNOSIS — Z2821 Immunization not carried out because of patient refusal: Secondary | ICD-10-CM | POA: Diagnosis not present

## 2020-11-11 DIAGNOSIS — Z299 Encounter for prophylactic measures, unspecified: Secondary | ICD-10-CM | POA: Diagnosis not present

## 2020-11-11 DIAGNOSIS — E1165 Type 2 diabetes mellitus with hyperglycemia: Secondary | ICD-10-CM | POA: Diagnosis not present

## 2020-11-12 ENCOUNTER — Other Ambulatory Visit: Payer: Self-pay

## 2020-11-12 ENCOUNTER — Ambulatory Visit
Admission: RE | Admit: 2020-11-12 | Discharge: 2020-11-12 | Disposition: A | Discharge status: Home / Self Care | Payer: Medicare Other | Source: Ambulatory Visit | Attending: Oncology | Admitting: Oncology

## 2020-11-12 DIAGNOSIS — Z170 Estrogen receptor positive status [ER+]: Secondary | ICD-10-CM

## 2020-11-12 DIAGNOSIS — Z853 Personal history of malignant neoplasm of breast: Secondary | ICD-10-CM | POA: Diagnosis not present

## 2020-11-12 DIAGNOSIS — R922 Inconclusive mammogram: Secondary | ICD-10-CM | POA: Diagnosis not present

## 2020-11-12 DIAGNOSIS — C50212 Malignant neoplasm of upper-inner quadrant of left female breast: Secondary | ICD-10-CM

## 2020-11-18 ENCOUNTER — Ambulatory Visit (INDEPENDENT_AMBULATORY_CARE_PROVIDER_SITE_OTHER): Payer: Medicare Other | Admitting: Gastroenterology

## 2020-11-25 ENCOUNTER — Inpatient Hospital Stay: Payer: Medicare Other

## 2020-11-25 ENCOUNTER — Inpatient Hospital Stay: Payer: Medicare Other | Attending: Oncology | Admitting: Oncology

## 2020-11-25 VITALS — BP 143/92 | HR 112 | Temp 97.5°F | Resp 18 | Wt 267.3 lb

## 2020-11-25 DIAGNOSIS — Z170 Estrogen receptor positive status [ER+]: Secondary | ICD-10-CM | POA: Diagnosis not present

## 2020-11-25 DIAGNOSIS — C50212 Malignant neoplasm of upper-inner quadrant of left female breast: Secondary | ICD-10-CM

## 2020-11-25 DIAGNOSIS — Z803 Family history of malignant neoplasm of breast: Secondary | ICD-10-CM | POA: Diagnosis not present

## 2020-11-25 DIAGNOSIS — Z79810 Long term (current) use of selective estrogen receptor modulators (SERMs): Secondary | ICD-10-CM | POA: Diagnosis not present

## 2020-11-25 DIAGNOSIS — C50812 Malignant neoplasm of overlapping sites of left female breast: Secondary | ICD-10-CM | POA: Diagnosis not present

## 2020-11-25 DIAGNOSIS — Z780 Asymptomatic menopausal state: Secondary | ICD-10-CM | POA: Diagnosis not present

## 2020-11-25 DIAGNOSIS — M1712 Unilateral primary osteoarthritis, left knee: Secondary | ICD-10-CM | POA: Diagnosis not present

## 2020-11-25 DIAGNOSIS — Z923 Personal history of irradiation: Secondary | ICD-10-CM | POA: Insufficient documentation

## 2020-11-25 LAB — CBC WITH DIFFERENTIAL (CANCER CENTER ONLY)
Abs Immature Granulocytes: 0.02 10*3/uL (ref 0.00–0.07)
Basophils Absolute: 0.1 10*3/uL (ref 0.0–0.1)
Basophils Relative: 2 %
Eosinophils Absolute: 0.4 10*3/uL (ref 0.0–0.5)
Eosinophils Relative: 4 %
HCT: 38.9 % (ref 36.0–46.0)
Hemoglobin: 12.6 g/dL (ref 12.0–15.0)
Immature Granulocytes: 0 %
Lymphocytes Relative: 29 %
Lymphs Abs: 2.6 10*3/uL (ref 0.7–4.0)
MCH: 27 pg (ref 26.0–34.0)
MCHC: 32.4 g/dL (ref 30.0–36.0)
MCV: 83.3 fL (ref 80.0–100.0)
Monocytes Absolute: 0.7 10*3/uL (ref 0.1–1.0)
Monocytes Relative: 7 %
Neutro Abs: 5.3 10*3/uL (ref 1.7–7.7)
Neutrophils Relative %: 58 %
Platelet Count: 330 10*3/uL (ref 150–400)
RBC: 4.67 MIL/uL (ref 3.87–5.11)
RDW: 14.1 % (ref 11.5–15.5)
WBC Count: 9.2 10*3/uL (ref 4.0–10.5)
nRBC: 0 % (ref 0.0–0.2)

## 2020-11-25 LAB — CMP (CANCER CENTER ONLY)
ALT: 17 U/L (ref 0–44)
AST: 19 U/L (ref 15–41)
Albumin: 3.8 g/dL (ref 3.5–5.0)
Alkaline Phosphatase: 139 U/L — ABNORMAL HIGH (ref 38–126)
Anion gap: 10 (ref 5–15)
BUN: 14 mg/dL (ref 6–20)
CO2: 25 mmol/L (ref 22–32)
Calcium: 9.6 mg/dL (ref 8.9–10.3)
Chloride: 103 mmol/L (ref 98–111)
Creatinine: 0.89 mg/dL (ref 0.44–1.00)
GFR, Estimated: >60 mL/min (ref >60)
Glucose, Bld: 254 mg/dL — ABNORMAL HIGH (ref 70–99)
Potassium: 3.7 mmol/L (ref 3.5–5.1)
Sodium: 138 mmol/L (ref 135–145)
Total Bilirubin: 0.6 mg/dL (ref 0.3–1.2)
Total Protein: 7.6 g/dL (ref 6.5–8.1)

## 2020-11-25 MED ORDER — LETROZOLE 2.5 MG PO TABS: 2.5000 mg | ORAL_TABLET | Freq: Every day | ORAL | 3 refills | Status: DC

## 2020-11-25 NOTE — Progress Notes (Signed)
[facility]  [phone]) [phone] [phone]) [phone]     ID: Lindsay Pacheco DOB: 11/26/1960  MR#: [id number]  YBO#:[id number]  Patient Care Team: Monico Blitz, MD as PCP - General (Internal Medicine) Alexah Kivett, Virgie Dad, MD as Consulting Physician (Oncology) Kyung Rudd, MD as Consulting Physician (Radiation Oncology) [name], MD as Consulting Physician (General Surgery) [name], MD as Consulting Physician (Gastroenterology) Norman Clay, MD as Consulting Physician (Psychiatry) Kathrynn Ducking, MD as Consulting Physician (Neurology) OTHER MD:  CHIEF COMPLAINT: Estrogen receptor positive breast cancer  CURRENT TREATMENT: Completing 5 years of letrozole   INTERVAL HISTORY: Lindsay Pacheco returns today for follow up of her estrogen positive bresat cancer.    She continues on letrozole.  She does have hot flashes but they are bearable she says.  She has arthritis particularly in her left knee but no significant arthralgias or myalgias associated to the medication.  Since her last visit, she underwent bilateral diagnostic mammography with tomography at [facility] on 11/13/2019 showing: breast density category B; no evidence of malignancy in either breast.   She is scheduled for bone density screening on 03/15/2021.  Of note, she underwent colonoscopy on 12/18/2019 under Dr. Laural Golden. Pathology from the procedure ([id number] ) showed colonic mucosa with no significant pathologic findings, active inflammation, or other abnormalities. Next colonoscopy will be in 5 years.   REVIEW OF SYSTEMS: Lindsay Pacheco continues to be very concerned about her mother who is declining rapidly and is currently at [facility] long hospital.  She is "not yet under hospice".  Lindsay Pacheco is trying to lose enough weight that she can have her left knee repaired.  She is unable to exercise as a result of that problem.  Otherwise a detailed review of systems today was stable   COVID 19  VACCINATION STATUS: s/p Moderna x2, most recently March 2021, with booster pending   BREAST CANCER HISTORY: From the original intake note:  The patient had routine bilateral screening mammography 09/10/2015 showing apparent new right breast calcifications and an area of distortion and possible mass in the left breast. On 09/23/2015 she underwent bilateral diagnostic mammography and left breast ultrasonography. The breast density was category C. The right breast calcification were felt to be benign. However in the left breast there was a persistent area of distortion in the superior subareolar areolar area, with 2 or 3 small masses in the upper inner quadrant. On exam there was an area of firm tissue just above the nipple of the left breast. Ultrasonography confirmed an area of shadowing at the 12:00 radiant 1 cm from the nipple and in addition 3 adjacent hypoechoic masses in the upper inner quadrant of the left breast, the largest measuring 1.1 cm and altogether measuring 2.5 cm. Ultrasound of the left axilla was benign  On 09/30/2015 the patient underwent cyst aspiration and also ultrasound-guided biopsy of 2 areas in the left breast, described as 11:00 and 12:00. The 12:00 area showed invasive ductal carcinoma, grade 1. The 11:00 biopsy showed ductal carcinoma in situ. The invasive tumor was estrogen receptor positive at greater than 90%, progesterone receptor positive at greater than 90%, both with strong staining intensity. HER-2 was equivocal by immunohistochemistry but negative by FISH with a signals ratio of 1.23 and the number per cell 2.0.  On 10/12/2015 the patient had a breast MRI showing no findings on the right, but on the left at the 12:00 region there was a spiculated enhancing mass measuring 2.2 cm. In the upper inner quadrant  of the left breast there was an area of enhancement measuring 1.1 cm. In the lateral aspect of the breast was a benign-appearing intramammary lymph node and there  were no abnormal appearing lymph nodes in either axilla.  With this information the patient proceeded to left seed bracketed lumpectomy and sentinel lymph node sampling 11/02/2015. The final pathology ([id number]) confirmed invasive ductal carcinoma, grade 1, measuring 2.2 cm, with all 3 sentinel lymph nodes clear. Margins were clear. The repeat prognostic panel again showed estrogen receptor 100% positivity, progesterone receptor 100% positivity, both with strong staining intensity, and no HER-2 amplification, the signals ratio being 1.06 and the number per cell 1.90.  Oncotype DX score of 18 predicted a 10 year risk of outside the breast recurrence of 12% if the patient's only systemic treatment was tamoxifen for 5 years. It also predicted no significant benefit from chemotherapy The patient then proceeded to adjuvant radiation completed 02/11/2016. She was started on letrozole 03/03/2016 by our survivorship nurse practitioner  Her subsequent history is as detailed below   PAST MEDICAL HISTORY: Past Medical History:  Diagnosis Date   Anxiety    Arthritis    "knees" (11/02/2015)   Breast cancer ([place]) 2017   left breast    Breast cancer, left breast ([place]) 09/30/2015   Cataracts, bilateral    just watching right now   Depression    Diabetes mellitus, type II ([place])    Diverticulitis 1987   Family history of breast cancer    Family history of polyps in the colon    Hyperlipidemia    Hypertension    Irritable bowel syndrome (IBS)    Personal history of radiation therapy    Sleep apnea    does not use CPAP but "I'm suppose to" (11/02/2015)   SVD (spontaneous vaginal delivery)    x 1    PAST SURGICAL HISTORY: Past Surgical History:  Procedure Laterality Date   25 GAUGE PARS PLANA VITRECTOMY WITH 20 GAUGE MVR PORT FOR MACULAR HOLE Left 07/03/2018   Procedure: 25 GAUGE PARS PLANA VITRECTOMY WITH 20 GAUGE MVR PORT FOR MACULAR HOLE;  Surgeon: [name], MD;  Location: [facility];   Service: Ophthalmology;  Laterality: Left;   BIOPSY  12/18/2019   Procedure: BIOPSY;  Surgeon: [name], MD;  [facility];  Service: Endoscopy;;  colon   BREAST BIOPSY Left 09/2015   BREAST LUMPECTOMY Left 11/02/2015   BREAST LUMPECTOMY WITH AXILLARY LYMPH NODE BIOPSY Left 11/02/2015   BREAST LUMPECTOMY WITH BRACKETED RADIOACTIVE SEED AND LEFT AXILLARY SENTINEL LYMPH NODE BIOPSY    BREAST LUMPECTOMY WITH RADIOACTIVE SEED AND SENTINEL LYMPH NODE BIOPSY Left 11/02/2015   Procedure: LEFT BREAST LUMPECTOMY WITH BRACKETED RADIOACTIVE SEED AND LEFT AXILLARY SENTINEL LYMPH NODE BIOPSY;  Surgeon: [name], MD;  Location: [facility];  Service: General;  Laterality: Left;   COLONOSCOPY N/A 12/09/2015   Procedure: COLONOSCOPY;  Surgeon: [name], MD;  [facility];  Service: Endoscopy;  Laterality: N/A;  1:00   COLONOSCOPY WITH PROPOFOL N/A 12/18/2019   Procedure: COLONOSCOPY WITH PROPOFOL;  Surgeon: [name], MD;  [facility];  Service: Endoscopy;  Laterality: N/A;  1030   EVACUATION BREAST HEMATOMA Left 11/02/2015   EVACUATION BREAST HEMATOMA Left 11/02/2015   Procedure: EVACUATION HEMATOMA BREAST;  Surgeon: [name], MD;  Location: [facility];  Service: General;  Laterality: Left;   GAS/FLUID EXCHANGE Left 07/03/2018   Procedure: Gas/Fluid Exchange;  Surgeon: [name], MD;  Location: [facility];  Service: Ophthalmology;  Laterality: Left;   JOINT REPLACEMENT     KNEE ARTHROSCOPY Right 2000s   LAPAROSCOPIC CHOLECYSTECTOMY  1995   PHOTOCOAGULATION WITH LASER Left 07/03/2018   Procedure: Photocoagulation With Laser;  Surgeon: [name], MD;  Location: [facility];  Service: Ophthalmology;  Laterality: Left;   POLYPECTOMY  12/09/2015   Procedure: POLYPECTOMY; colon polyp  Surgeon: [name], MD;  [facility];  Service: Endoscopy;;  multiple   TOTAL KNEE ARTHROPLASTY Right 2011    WISDOM TOOTH EXTRACTION      FAMILY HISTORY Family History  Problem Relation Age of Onset   Anxiety disorder Father    Depression Father    Other Father        Abestosis   Depression Sister    Diabetes Mother    Breast cancer Paternal Aunt   The patient's parents are still living, in their mid to late 69s. The patient has one brother and one sister. On the paternal side one uncle had spinal cord cancer and both paternal grandparents had lung cancer in the setting of tobacco abuse. The patient has one paternal aunt diagnosed with breast cancer in her 18s   GYNECOLOGIC HISTORY:  No LMP recorded (lmp unknown). Patient is postmenopausal. Menarche age 67, first live birth age 7, she is [place] P1. She went through menopause in her early 66s. She never took hormone replacement.   SOCIAL HISTORY: (Updated November 2021.) Lindsay Pacheco used to work in [employer], applications but she is disabled secondary to knee problems and psychiatry issues.  She is divorced. She lives alone though her mother lives next door and given her mother's cognitive difficulties Sybel thinks she may be moving in with her soon. Daughter Benjie Karvonen graduated from [employer] and is working in [place] for the [employer]. The patient is a [employer].    ADVANCED DIRECTIVES: Patient has the paperwork but has not yet completed it   HEALTH MAINTENANCE: Social History   Tobacco Use   Smoking status: Never   Smokeless tobacco: Never  Vaping Use   Vaping Use: Never used  Substance Use Topics   Alcohol use: Not Currently    Comment: socially    Drug use: Not Currently    Comment: last used in 2018     Colonoscopy: 12/2019 (Dr. Laural Golden), recall 2026  PAP:  Bone density: DEC 2017/ [facility]   Allergies  Allergen Reactions   Sulfa Antibiotics Nausea And Vomiting    Current Outpatient Medications  Medication Sig Dispense Refill   acetaminophen (TYLENOL) 500 MG tablet Take 1,000 mg by mouth daily as needed for  headache.     aspirin 81 MG tablet Take 1 tablet (81 mg total) by mouth daily. 30 tablet    atorvastatin (LIPITOR) 10 MG tablet Take 1 tablet (10 mg total) by mouth daily. For high cholesterol 30 tablet 0   bismuth subsalicylate (PEPTO BISMOL) 262 MG/15ML suspension Take 30 mLs by mouth every 6 (six) hours as needed for indigestion or diarrhea or loose stools.     calcium carbonate (TUMS - DOSED IN MG ELEMENTAL CALCIUM) 500 MG chewable tablet Chew 2 tablets by mouth daily as needed for indigestion or heartburn.     cetirizine (ZYRTEC) 10 MG tablet Take 10 mg by mouth daily as needed for allergies.      dicyclomine (BENTYL) 10 MG capsule Take 1 capsule (10 mg total) by mouth 3 (three) times daily  as needed (diarrhea). 60 capsule 1   DULoxetine (CYMBALTA) 60 MG capsule Take 1 capsule (60 mg total) by mouth 2 (two) times daily. For mood control (Patient taking differently: Take 120 mg by mouth daily. For mood control) 60 capsule 0   gabapentin (NEURONTIN) 300 MG capsule One capsule in the morning and 2 in the evening (Patient taking differently: Take 300-600 mg by mouth See admin instructions. One capsule (300 mg) in the morning and 2 (600 mg) in the evening) 270 capsule 1   glimepiride (AMARYL) 2 MG tablet Take 2 mg by mouth daily with breakfast.     letrozole (FEMARA) 2.5 MG tablet TAKE ONE TABLET BY MOUTH EVERY DAY 90 tablet 3   lisinopril (PRINIVIL,ZESTRIL) 20 MG tablet Take 1 tablet (20 mg total) by mouth daily. For high blood pressure 30 tablet 0   metFORMIN (GLUCOPHAGE) 500 MG tablet Take 1,000 mg by mouth 2 (two) times daily with a meal.      traZODone (DESYREL) 50 MG tablet Take 1 tablet (50 mg total) by mouth at bedtime as needed for sleep. 30 tablet 0   No current facility-administered medications for this visit.    OBJECTIVE: White woman in no acute distress  Vitals:   11/25/20 1407  BP: (!) 143/92  Pulse: (!) 112  Resp: 18  Temp: (!) 97.5 F (36.4 C)  SpO2: 93%      Body mass  index is 41.25 kg/m.    ECOG FS:1 - Symptomatic but completely ambulatory  Sclerae unicteric, EOMs intact Wearing a mask No cervical or supraclavicular adenopathy Lungs no rales or rhonchi Heart regular rate and rhythm Abd soft, nontender, positive bowel sounds MSK no focal spinal tenderness, no upper extremity lymphedema Neuro: nonfocal, well oriented, appropriate affect Breasts: The right breast is benign.  The left breast has undergone lumpectomy followed by radiation, with no evidence of residual or recurrent disease.  Both axillae are benign.   LAB RESULTS:  CMP     Component Value Date/Time   NA 138 11/25/2020 1318   NA 140 10/13/2016 1222   K 3.7 11/25/2020 1318   K 4.0 10/13/2016 1222   CL 103 11/25/2020 1318   CO2 25 11/25/2020 1318   CO2 27 10/13/2016 1222   GLUCOSE 254 (H) 11/25/2020 1318   GLUCOSE 234 (H) 10/13/2016 1222   BUN 14 11/25/2020 1318   BUN 12.5 10/13/2016 1222   CREATININE 0.89 11/25/2020 1318   CREATININE 0.9 10/13/2016 1222   CALCIUM 9.6 11/25/2020 1318   CALCIUM 10.0 10/13/2016 1222   PROT 7.6 11/25/2020 1318   PROT 7.5 10/13/2016 1222   ALBUMIN 3.8 11/25/2020 1318   ALBUMIN 3.7 10/13/2016 1222   AST 19 11/25/2020 1318   AST 17 10/13/2016 1222   ALT 17 11/25/2020 1318   ALT 15 10/13/2016 1222   ALKPHOS 139 (H) 11/25/2020 1318   ALKPHOS 139 10/13/2016 1222   BILITOT 0.6 11/25/2020 1318   BILITOT 0.67 10/13/2016 1222   GFRNONAA >60 11/25/2020 1318   GFRAA >60 11/22/2018 1116    No results found for: TOTALPROTELP, ALBUMINELP, A1GS, A2GS, BETS, BETA2SER, GAMS, MSPIKE, SPEI  No results found for: KPAFRELGTCHN, LAMBDASER, KAPLAMBRATIO  Lab Results  Component Value Date   WBC 9.2 11/25/2020   NEUTROABS 5.3 11/25/2020   HGB 12.6 11/25/2020   HCT 38.9 11/25/2020   MCV 83.3 11/25/2020   PLT 330 11/25/2020      Chemistry      Component Value Date/Time   NA  138 11/25/2020 1318   NA 140 10/13/2016 1222   K 3.7 11/25/2020 1318   K 4.0  10/13/2016 1222   CL 103 11/25/2020 1318   CO2 25 11/25/2020 1318   CO2 27 10/13/2016 1222   BUN 14 11/25/2020 1318   BUN 12.5 10/13/2016 1222   CREATININE 0.89 11/25/2020 1318   CREATININE 0.9 10/13/2016 1222      Component Value Date/Time   CALCIUM 9.6 11/25/2020 1318   CALCIUM 10.0 10/13/2016 1222   ALKPHOS 139 (H) 11/25/2020 1318   ALKPHOS 139 10/13/2016 1222   AST 19 11/25/2020 1318   AST 17 10/13/2016 1222   ALT 17 11/25/2020 1318   ALT 15 10/13/2016 1222   BILITOT 0.6 11/25/2020 1318   BILITOT 0.67 10/13/2016 1222       No results found for: LABCA2  No components found for: BWLSLH734  No results for input(s): INR in the last 168 hours.  Urinalysis    Component Value Date/Time   COLORURINE YELLOW 01/20/2010 1045   APPEARANCEUR CLEAR 01/20/2010 1045   LABSPEC 1.016 01/20/2010 1045   PHURINE 6.0 01/20/2010 1045   GLUCOSEU NEGATIVE 10/06/2009 1040   HGBUR NEGATIVE 01/20/2010 [place] 01/20/2010 [place] 01/20/2010 1045   PROTEINUR NEGATIVE 01/20/2010 1045   UROBILINOGEN 0.2 01/20/2010 1045   NITRITE NEGATIVE 01/20/2010 1045   LEUKOCYTESUR SMALL (A) 01/20/2010 1045    STUDIES: MM DIAG BREAST TOMO BILATERAL  Result Date: 11/12/2020 CLINICAL DATA:  History of LEFT breast cancer 2017 status post lumpectomy and radiation therapy. EXAM: DIGITAL DIAGNOSTIC BILATERAL MAMMOGRAM WITH TOMOSYNTHESIS AND CAD TECHNIQUE: Bilateral digital diagnostic mammography and breast tomosynthesis was performed. The images were evaluated with computer-aided detection. COMPARISON:  Previous exam(s). ACR Breast Density Category b: There are scattered areas of fibroglandular density. FINDINGS: There are stable postsurgical changes within the LEFT breast. There are no new dominant masses, suspicious calcifications or secondary signs of malignancy within either breast. IMPRESSION: No evidence of malignancy within either breast. Stable postsurgical changes  within the LEFT breast. RECOMMENDATION: 1.  Screening mammogram in one year.(Code:[id number]) 2. Per protocol, as the patient is now 5 years status post lumpectomy, she may return to annual screening mammography in 1 year. However, given the history of breast cancer, the patient remains eligible for annual diagnostic mammography if preferred. I have discussed the findings and recommendations with the patient. If applicable, a reminder letter will be sent to the patient regarding the next appointment. BI-RADS CATEGORY  2: Benign. Electronically Signed   By: Franki Cabot M.D.   On: 11/12/2020 15:04      ASSESSMENT: 60 y.o. Lindsay Pacheco, Lindsay Pacheco woman status post left breast biopsy 2 (overlapping sites) 09/30/2015 for invasive ductal carcinoma, grade 1, estrogen receptor and progesterone receptor strongly positive, HER-2 nonamplified.  (1) status post left lumpectomy 11/02/2015 for a pT2 pN0, stage IIA (2017 staging) invasive ductal carcinoma, again estrogen and progesterone receptor positive, HER-2 not amplified, with negative margins  (a) restaged as IB in the 2018 prognostic classification     (2) Oncotype score of 18 predicts a 10 year risk of recurrence outside the breast of 12% if the patient's only systemic therapy is tamoxifen for 5 years. It also predicts no significant benefit from chemotherapy  (3) Adjuvant radiation 12/22/15 - 02/11/16  Site/dose:    1) Left breast: 50.4 Gy in 28 fractions 2) Left breast boost: 10 Gy in 5 fractions  (4) letrozole started 03/03/2016  (a) DEXA scan 12/07/2015  at [facility] showed a T score of -0.8   PLAN: Lindsay Pacheco is now a little over 5 years out from definitive surgery for her breast cancer with no evidence of disease recurrence.  This is very favorable.  She will continue Femara/letrozole through February 2023 and then discontinue that medication.  She would like to maintain contact with [place] particularly since she is currently without a primary care  physician.  She will see my nurse practitioner in November 2023 in survivorship, review her mammography and her bone density from next year  Total encounter time 25 minutes.Sarajane Jews C. Lauraine Crespo, MD  11/25/20 2:27 PM Medical Oncology and Hematology [facility] [place],  [address] Tel. [phone]    Fax. [phone]   I, Wilburn Mylar, am acting as scribe for Dr. Virgie Dad. Jyasia Markoff.  I, Lurline Del MD, have reviewed the above documentation for accuracy and completeness, and I agree with the above.   *Total Encounter Time as defined by the Centers for Medicare and Medicaid Services includes, in addition to the face-to-face time of a patient visit (documented in the note above) non-face-to-face time: obtaining and reviewing outside history, ordering and reviewing medications, tests or procedures, care coordination (communications with other health care professionals or caregivers) and documentation in the medical record.

## 2020-12-01 DIAGNOSIS — Z713 Dietary counseling and surveillance: Secondary | ICD-10-CM | POA: Diagnosis not present

## 2020-12-01 DIAGNOSIS — U071 COVID-19: Secondary | ICD-10-CM | POA: Diagnosis not present

## 2020-12-01 DIAGNOSIS — Z299 Encounter for prophylactic measures, unspecified: Secondary | ICD-10-CM | POA: Diagnosis not present

## 2020-12-01 DIAGNOSIS — R5383 Other fatigue: Secondary | ICD-10-CM | POA: Diagnosis not present

## 2020-12-03 ENCOUNTER — Telehealth: Payer: Self-pay | Admitting: Oncology

## 2020-12-03 NOTE — Telephone Encounter (Signed)
Sch per 11/23 los, mailed calendar

## 2020-12-10 ENCOUNTER — Other Ambulatory Visit (INDEPENDENT_AMBULATORY_CARE_PROVIDER_SITE_OTHER): Payer: Self-pay

## 2020-12-10 ENCOUNTER — Encounter (INDEPENDENT_AMBULATORY_CARE_PROVIDER_SITE_OTHER): Payer: Self-pay | Admitting: Gastroenterology

## 2020-12-10 ENCOUNTER — Ambulatory Visit (INDEPENDENT_AMBULATORY_CARE_PROVIDER_SITE_OTHER): Payer: Medicare Other | Admitting: Gastroenterology

## 2020-12-10 ENCOUNTER — Encounter (INDEPENDENT_AMBULATORY_CARE_PROVIDER_SITE_OTHER): Payer: Self-pay

## 2020-12-10 ENCOUNTER — Other Ambulatory Visit: Payer: Self-pay

## 2020-12-10 DIAGNOSIS — K580 Irritable bowel syndrome with diarrhea: Secondary | ICD-10-CM

## 2020-12-10 DIAGNOSIS — K589 Irritable bowel syndrome without diarrhea: Secondary | ICD-10-CM | POA: Insufficient documentation

## 2020-12-10 DIAGNOSIS — R140 Abdominal distension (gaseous): Secondary | ICD-10-CM | POA: Diagnosis not present

## 2020-12-10 DIAGNOSIS — R197 Diarrhea, unspecified: Secondary | ICD-10-CM | POA: Diagnosis not present

## 2020-12-10 DIAGNOSIS — K219 Gastro-esophageal reflux disease without esophagitis: Secondary | ICD-10-CM

## 2020-12-10 DIAGNOSIS — K529 Noninfective gastroenteritis and colitis, unspecified: Secondary | ICD-10-CM | POA: Diagnosis not present

## 2020-12-10 MED ORDER — RIFAXIMIN 550 MG PO TABS: 550.0000 mg | ORAL_TABLET | Freq: Three times a day (TID) | ORAL | 0 refills | Status: DC

## 2020-12-10 MED ORDER — DICYCLOMINE HCL 10 MG PO CAPS: 10.0000 mg | ORAL_CAPSULE | Freq: Two times a day (BID) | ORAL | 2 refills | Status: AC | PRN

## 2020-12-10 MED ORDER — OMEPRAZOLE 40 MG PO CPDR
40.0000 mg | DELAYED_RELEASE_CAPSULE | Freq: Every day | ORAL | 3 refills | Status: DC
Start: 2020-12-10 — End: 2021-11-09

## 2020-12-10 NOTE — Progress Notes (Signed)
Maylon Peppers, M.D. Gastroenterology & Hepatology [facility] [address] [place], [place] [address]  Primary Care Physician: Monico Blitz, [place] [address]  I will communicate my assessment and recommendations to the referring MD via EMR.  Problems: IBS-D GERD  History of Present Illness: Lindsay Pacheco is a 60 y.o. female with PMH breast cancer sp lumpectomy and radiotherapy, diabetes, depression, hyperlipidemia, hypertension, IBS-D, OSA, who presents for follow up of diarrhea and heartburn.  The patient was last seen on 11/18/2019. At that time, the patient had GI pathogen and C. difficile testing which came back negative for infection. Had negative celiac serology in 11/18/2019.  She also underwent colonoscopy for surveillance of colonic polyps, biopsies from normal, were negative for microscopic colitis.  Patient reports chronic history of IBS-D, which worsened for the last 2 years, especially for the last month. She states she has lost close to 20 lb. She states that she has frequent diarrhea, which has a consistency of [place] 6-7. She also reports having worsening abdominal pain in her upper abdomen which is intermittent. States she has foul smelling gas, which has been upsetting for her. Also reports that she has bloating in the upper abdomen. States she thinks she was prescribed bentyl in the past but is not sure if she has taken it recently.Her appetite has decreased. She has noticed her diarrhea gets worse if she eats something different from bread and noodles.  She is also concerned as she has a hemorrhoid. States she has to clean frequently as she has stool stains in her underwear - states that this has been going on for at least the las 2 years.  Also states having frequent heartburn - does not drink any medication for this but only salt water. Has heartburn daily but no dysphagia.  The patient stated being  very concerned of having a hiatal hernia as her mother had a large hernia and could not eat esophagogastroduodenospy to eat but given her age, will undergo surgical intervention to repair it.  She was very tearful when she was relating this to me.  Had a cholecystectomy in 1995.  The patient denies having any nausea, vomiting, fever, chills, hematochezia, melena, hematemesis, jaundice, pruritus.  Last QMV:HQION Last Colonoscopy:2021 The perianal and digital rectal examinations were normal. Multiple small and large-mouthed diverticula were found in the entire colon. The exam was otherwise normal throughout the examined colon. There is no endoscopic evidence of inflammation in the entire colon. Biopsies for histology were taken with a cold forceps from the ascending colon and sigmoid colon for evaluation of microscopic colitis - path was normal. External hemorrhoids were found during retroflexion. The hemorrhoids were medium-sized.   Past Medical History: Past Medical History:  Diagnosis Date   Anxiety    Arthritis    "knees" (11/02/2015)   Breast cancer ([place]) 2017   left breast    Breast cancer, left breast ([place]) 09/30/2015   Cataracts, bilateral    just watching right now   Depression    Diabetes mellitus, type II ([place])    Diverticulitis 1987   Family history of breast cancer    Family history of polyps in the colon    Hyperlipidemia    Hypertension    Irritable bowel syndrome (IBS)    Personal history of radiation therapy    Sleep apnea    does not use CPAP but "I'm suppose to" (11/02/2015)   SVD (spontaneous vaginal delivery)  x 1    Past Surgical History: Past Surgical History:  Procedure Laterality Date   25 GAUGE PARS PLANA VITRECTOMY WITH 20 GAUGE MVR PORT FOR MACULAR HOLE Left 07/03/2018   Procedure: 25 GAUGE PARS PLANA VITRECTOMY WITH 20 GAUGE MVR PORT FOR MACULAR HOLE;  Surgeon: [name], MD;  Location: [facility];  Service: Ophthalmology;  Laterality: Left;    BIOPSY  12/18/2019   Procedure: BIOPSY;  Surgeon: [name], MD;  [facility];  Service: Endoscopy;;  colon   BREAST BIOPSY Left 09/2015   BREAST LUMPECTOMY Left 11/02/2015   BREAST LUMPECTOMY WITH AXILLARY LYMPH NODE BIOPSY Left 11/02/2015   BREAST LUMPECTOMY WITH BRACKETED RADIOACTIVE SEED AND LEFT AXILLARY SENTINEL LYMPH NODE BIOPSY    BREAST LUMPECTOMY WITH RADIOACTIVE SEED AND SENTINEL LYMPH NODE BIOPSY Left 11/02/2015   Procedure: LEFT BREAST LUMPECTOMY WITH BRACKETED RADIOACTIVE SEED AND LEFT AXILLARY SENTINEL LYMPH NODE BIOPSY;  Surgeon: [name], MD;  Location: [place];  Service: General;  Laterality: Left;   COLONOSCOPY N/A 12/09/2015   Procedure: COLONOSCOPY;  Surgeon: [name], MD;  [facility];  Service: Endoscopy;  Laterality: N/A;  1:00   COLONOSCOPY WITH PROPOFOL N/A 12/18/2019   Procedure: COLONOSCOPY WITH PROPOFOL;  Surgeon: [name], MD;  [facility];  Service: Endoscopy;  Laterality: N/A;  1030   EVACUATION BREAST HEMATOMA Left 11/02/2015   EVACUATION BREAST HEMATOMA Left 11/02/2015   Procedure: EVACUATION HEMATOMA BREAST;  Surgeon: [name], MD;  Location: [place];  Service: General;  Laterality: Left;   GAS/FLUID EXCHANGE Left 07/03/2018   Procedure: Gas/Fluid Exchange;  Surgeon: [name], MD;  Location: [place];  Service: Ophthalmology;  Laterality: Left;   JOINT REPLACEMENT     KNEE ARTHROSCOPY Right 2000s   LAPAROSCOPIC CHOLECYSTECTOMY  1995   PHOTOCOAGULATION WITH LASER Left 07/03/2018   Procedure: Photocoagulation With Laser;  Surgeon: [name], MD;  Location: [facility];  Service: Ophthalmology;  Laterality: Left;   POLYPECTOMY  12/09/2015   Procedure: POLYPECTOMY; colon polyp  Surgeon: [name], MD;  [facility];  Service: Endoscopy;;  multiple   TOTAL KNEE ARTHROPLASTY Right 2011   WISDOM TOOTH EXTRACTION      Family  History: Family History  Problem Relation Age of Onset   Anxiety disorder Father    Depression Father    Other Father        Abestosis   Depression Sister    Diabetes Mother    Breast cancer Paternal Aunt     Social History: Social History   Tobacco Use  Smoking Status Never  Smokeless Tobacco Never   Social History   Substance and Sexual Activity  Alcohol Use Yes   Comment: socially    Social History   Substance and Sexual Activity  Drug Use Not Currently   Comment: last used in 2018    Allergies: Allergies  Allergen Reactions   Sulfa Antibiotics Nausea And Vomiting    Medications: Current Outpatient Medications  Medication Sig Dispense Refill   acetaminophen (TYLENOL) 500 MG tablet Take 1,000 mg by mouth daily as needed for headache.     aspirin 81 MG tablet Take 1 tablet (81 mg total) by mouth daily. 30 tablet    atorvastatin (LIPITOR) 10 MG tablet Take 1 tablet (10 mg total) by mouth daily. For high cholesterol 30 tablet 0   bismuth subsalicylate (PEPTO BISMOL) 262 MG/15ML suspension Take 30  mLs by mouth every 6 (six) hours as needed for indigestion or diarrhea or loose stools.     calcium carbonate (TUMS - DOSED IN MG ELEMENTAL CALCIUM) 500 MG chewable tablet Chew 2 tablets by mouth daily as needed for indigestion or heartburn.     cetirizine (ZYRTEC) 10 MG tablet Take 10 mg by mouth daily as needed for allergies.      dicyclomine (BENTYL) 10 MG capsule Take 1 capsule (10 mg total) by mouth 3 (three) times daily as needed (diarrhea). 60 capsule 1   DULoxetine (CYMBALTA) 60 MG capsule Take 1 capsule (60 mg total) by mouth 2 (two) times daily. For mood control (Patient taking differently: Take 120 mg by mouth daily. For mood control) 60 capsule 0   gabapentin (NEURONTIN) 300 MG capsule One capsule in the morning and 2 in the evening (Patient taking differently: Take 300-600 mg by mouth See admin instructions. One capsule (300 mg) in the morning and 2 (600 mg) in  the evening) 270 capsule 1   glimepiride (AMARYL) 2 MG tablet Take 2 mg by mouth daily with breakfast.     letrozole (FEMARA) 2.5 MG tablet Take 1 tablet (2.5 mg total) by mouth daily. Stop the medication last week in February 2023 90 tablet 3   lisinopril (PRINIVIL,ZESTRIL) 20 MG tablet Take 1 tablet (20 mg total) by mouth daily. For high blood pressure 30 tablet 0   metFORMIN (GLUCOPHAGE) 500 MG tablet Take 1,000 mg by mouth 2 (two) times daily with a meal.      traZODone (DESYREL) 50 MG tablet Take 1 tablet (50 mg total) by mouth at bedtime as needed for sleep. 30 tablet 0   No current facility-administered medications for this visit.    Review of Systems: GENERAL: negative for malaise, night sweats HEENT: No changes in hearing or vision, no nose bleeds or other nasal problems. NECK: Negative for lumps, goiter, pain and significant neck swelling RESPIRATORY: Negative for cough, wheezing CARDIOVASCULAR: Negative for chest pain, leg swelling, palpitations, orthopnea GI: SEE HPI MUSCULOSKELETAL: Negative for joint pain or swelling, back pain, and muscle pain. SKIN: Negative for lesions, rash PSYCH: Negative for sleep disturbance, mood disorder and recent psychosocial stressors. HEMATOLOGY Negative for prolonged bleeding, bruising easily, and swollen nodes. ENDOCRINE: Negative for cold or heat intolerance, polyuria, polydipsia and goiter. NEURO: negative for tremor, gait imbalance, syncope and seizures. The remainder of the review of systems is noncontributory.   Physical Exam: BP 109/69 (BP Location: Left Arm, Patient Position: Sitting, Cuff Size: Large)   Pulse (!) 118   Temp 98.1 F (36.7 C) (Oral)   Ht 5' 7.5" (1.715 m)   Wt 261 lb 8 oz (118.6 kg)   LMP  (LMP Unknown)   BMI 40.35 kg/m  GENERAL: The patient is AO x3, in no acute distress. Obese  HEENT: Head is normocephalic and atraumatic. EOMI are intact. Mouth is well hydrated and without lesions. NECK: Supple. No  masses LUNGS: Clear to auscultation. No presence of rhonchi/wheezing/rales. Adequate chest expansion HEART: RRR, normal s1 and s2. ABDOMEN: diffusely tender, no guarding, no peritoneal signs, and nondistended. BS +. No masses. EXTREMITIES: Without any cyanosis, clubbing, rash, lesions or edema. NEUROLOGIC: AOx3, no focal motor deficit. SKIN: no jaundice, no rashes  Imaging/Labs: as above  I personally reviewed and interpreted the available labs, imaging and endoscopic files.  Impression and Plan: Lindsay Pacheco is a 60 y.o. female with PMH breast cancer sp lumpectomy and radiotherapy, diabetes, depression, hyperlipidemia, hypertension, IBS-D,  OSA, who presents for follow up of diarrhea and heartburn.  The patient has presented chronic episodes of diarrhea and bloating for multiple years that have worsened recently.  Etiologies such as celiac disease or microscopic colitis have been ruled out as a reason for her symptoms.  It is very likely her symptoms are related to IBS-D which has been poorly controlled.  She may have significant improvement with a 2-week trial of Xifaxan for IBS-D as a first-line treatment for her chronic condition.  We will check for gastrointestinal infections as she had worsening of her diarrhea recently.  We will also check for thyroid function test today.  She will also benefit from implementing a low FODMAP diet.  Nevertheless, we will investigate for other organic etiologies with an EGD, which will also help [place] evaluate her recurrent episodes of heartburn and upper abdominal complaints.  Also, the use of Bentyl may provide relief for her abdominal pain and distention.  She has also presented recurrent regurgitation and heartburn.  I will start her on omeprazole 40 mg every day for possible GERD and she was advised to continue with weight loss as these may alleviate some of her symptoms.  - Schedule EGD - Start Xifaxan for 2 weeks - Explained presumed etiology of IBS  symptoms. Patient was counseled about the benefit of implementing a low FODMAP to improve symptoms and recurrent episodes. A dietary list was provided to the patient. Also, the patient was counseled about the benefit of avoiding stressing situations and potential environmental triggers leading to symptomatology. - Start Bentyl 1 tablet q12h as needed for abdominal pain - Start omeprazole 40 mg qday - Explained presumed etiology of reflux symptoms. Instruction provided in the use of antireflux medication - patient should take medication in the morning 30-45 minutes before eating breakfast. Discussed avoidance of eating within 2 hours of lying down to sleep and benefit of blocks to elevate head of bed. Also, will benefit from avoiding carbonated drinks/sodas or food that has tomatoes, spicy or greasy food. - Patient encouraged to lose weight as this may provide benefit for her specific complaint but also due to other comorbidities - Check TSH - Check C. Diff and GI pathogen panel  All questions were answered.      Harvel Quale, MD Gastroenterology and Hepatology [facility]

## 2020-12-10 NOTE — Patient Instructions (Addendum)
Schedule EGD Start Xifaxan for 2 weeks Explained presumed etiology of IBS symptoms. Patient was counseled about the benefit of implementing a low FODMAP to improve symptoms and recurrent episodes. A dietary list was provided to the patient. Also, the patient was counseled about the benefit of avoiding stressing situations and potential environmental triggers leading to symptomatology. Start Bentyl 1 tablet q12h as needed for abdominal pain Start omeprazole 40 mg qday Explained presumed etiology of reflux symptoms. Instruction provided in the use of antireflux medication - patient should take medication in the morning 30-45 minutes before eating breakfast. Discussed avoidance of eating within 2 hours of lying down to sleep and benefit of blocks to elevate head of bed. Also, will benefit from avoiding carbonated drinks/sodas or food that has tomatoes, spicy or greasy food. Patient encouraged to lose weight as this may provide benefit for her specific complaint but also due to other comorbidities Perform blood and stool workup

## 2020-12-10 NOTE — H&P (View-Only) (Signed)
Lindsay Pacheco, M.D. Gastroenterology & Hepatology [facility] [address] [place],  [address]  Primary Care Physician: Monico Blitz, [place] [address]  I will communicate my assessment and recommendations to the referring MD via EMR.  Problems: IBS-D GERD  History of Present Illness: Lindsay Pacheco is a 60 y.o. female with PMH breast cancer sp lumpectomy and radiotherapy, diabetes, depression, hyperlipidemia, hypertension, IBS-D, OSA, who presents for follow up of diarrhea and heartburn.  The patient was last seen on 11/18/2019. At that time, the patient had GI pathogen and C. difficile testing which came back negative for infection. Had negative celiac serology in 11/18/2019.  She also underwent colonoscopy for surveillance of colonic polyps, biopsies from normal, were negative for microscopic colitis.  Patient reports chronic history of IBS-D, which worsened for the last 2 years, especially for the last month. She states she has lost close to 20 lb. She states that she has frequent diarrhea, which has a consistency of [place] 6-7. She also reports having worsening abdominal pain in her upper abdomen which is intermittent. States she has foul smelling gas, which has been upsetting for her. Also reports that she has bloating in the upper abdomen. States she thinks she was prescribed bentyl in the past but is not sure if she has taken it recently.Her appetite has decreased. She has noticed her diarrhea gets worse if she eats something different from bread and noodles.  She is also concerned as she has a hemorrhoid. States she has to clean frequently as she has stool stains in her underwear - states that this has been going on for at least the las 2 years.  Also states having frequent heartburn - does not drink any medication for this but only salt water. Has heartburn daily but no dysphagia.  The patient stated being  very concerned of having a hiatal hernia as her mother had a large hernia and could not eat esophagogastroduodenospy to eat but given her age, will undergo surgical intervention to repair it.  She was very tearful when she was relating this to me.  Had a cholecystectomy in 1995.  The patient denies having any nausea, vomiting, fever, chills, hematochezia, melena, hematemesis, jaundice, pruritus.  Last ZHG:DJMEQ Last Colonoscopy:2021 The perianal and digital rectal examinations were normal. Multiple small and large-mouthed diverticula were found in the entire colon. The exam was otherwise normal throughout the examined colon. There is no endoscopic evidence of inflammation in the entire colon. Biopsies for histology were taken with a cold forceps from the ascending colon and sigmoid colon for evaluation of microscopic colitis - path was normal. External hemorrhoids were found during retroflexion. The hemorrhoids were medium-sized.   Past Medical History: Past Medical History:  Diagnosis Date   Anxiety    Arthritis    "knees" (11/02/2015)   Breast cancer ([place]) 2017   left breast    Breast cancer, left breast ([place]) 09/30/2015   Cataracts, bilateral    just watching right now   Depression    Diabetes mellitus, type II ([place])    Diverticulitis 1987   Family history of breast cancer    Family history of polyps in the colon    Hyperlipidemia    Hypertension    Irritable bowel syndrome (IBS)    Personal history of radiation therapy    Sleep apnea    does not use CPAP but "I'm suppose to" (11/02/2015)   SVD (spontaneous vaginal delivery)  x 1    Past Surgical History: Past Surgical History:  Procedure Laterality Date   25 GAUGE PARS PLANA VITRECTOMY WITH 20 GAUGE MVR PORT FOR MACULAR HOLE Left 07/03/2018   Procedure: 25 GAUGE PARS PLANA VITRECTOMY WITH 20 GAUGE MVR PORT FOR MACULAR HOLE;  Surgeon: [name], MD;  Location: [place];  Service: Ophthalmology;  Laterality: Left;    BIOPSY  12/18/2019   Procedure: BIOPSY;  Surgeon: [name], MD;  [facility];  Service: Endoscopy;;  colon   BREAST BIOPSY Left 09/2015   BREAST LUMPECTOMY Left 11/02/2015   BREAST LUMPECTOMY WITH AXILLARY LYMPH NODE BIOPSY Left 11/02/2015   BREAST LUMPECTOMY WITH BRACKETED RADIOACTIVE SEED AND LEFT AXILLARY SENTINEL LYMPH NODE BIOPSY    BREAST LUMPECTOMY WITH RADIOACTIVE SEED AND SENTINEL LYMPH NODE BIOPSY Left 11/02/2015   Procedure: LEFT BREAST LUMPECTOMY WITH BRACKETED RADIOACTIVE SEED AND LEFT AXILLARY SENTINEL LYMPH NODE BIOPSY;  Surgeon: [name], MD;  Location: [facility] [place];  Service: General;  Laterality: Left;   COLONOSCOPY N/A 12/09/2015   Procedure: COLONOSCOPY;  Surgeon: [name], MD;  [facility];  Service: Endoscopy;  Laterality: N/A;  1:00   COLONOSCOPY WITH PROPOFOL N/A 12/18/2019   Procedure: COLONOSCOPY WITH PROPOFOL;  Surgeon: [name], MD;  [facility];  Service: Endoscopy;  Laterality: N/A;  1030   EVACUATION BREAST HEMATOMA Left 11/02/2015   EVACUATION BREAST HEMATOMA Left 11/02/2015   Procedure: EVACUATION HEMATOMA BREAST;  Surgeon: [name], MD;  Location: [place];  Service: General;  Laterality: Left;   GAS/FLUID EXCHANGE Left 07/03/2018   Procedure: Gas/Fluid Exchange;  Surgeon: [name], MD;  Location: [place];  Service: Ophthalmology;  Laterality: Left;   JOINT REPLACEMENT     KNEE ARTHROSCOPY Right 2000s   LAPAROSCOPIC CHOLECYSTECTOMY  1995   PHOTOCOAGULATION WITH LASER Left 07/03/2018   Procedure: Photocoagulation With Laser;  Surgeon: [name], MD;  Location: [facility];  Service: Ophthalmology;  Laterality: Left;   POLYPECTOMY  12/09/2015   Procedure: POLYPECTOMY; colon polyp  Surgeon: [name], MD;  [facility];  Service: Endoscopy;;  multiple   TOTAL KNEE ARTHROPLASTY Right 2011   WISDOM TOOTH EXTRACTION      Family  History: Family History  Problem Relation Age of Onset   Anxiety disorder Father    Depression Father    Other Father        Abestosis   Depression Sister    Diabetes Mother    Breast cancer Paternal Aunt     Social History: Social History   Tobacco Use  Smoking Status Never  Smokeless Tobacco Never   Social History   Substance and Sexual Activity  Alcohol Use Yes   Comment: socially    Social History   Substance and Sexual Activity  Drug Use Not Currently   Comment: last used in 2018    Allergies: Allergies  Allergen Reactions   Sulfa Antibiotics Nausea And Vomiting    Medications: Current Outpatient Medications  Medication Sig Dispense Refill   acetaminophen (TYLENOL) 500 MG tablet Take 1,000 mg by mouth daily as needed for headache.     aspirin 81 MG tablet Take 1 tablet (81 mg total) by mouth daily. 30 tablet    atorvastatin (LIPITOR) 10 MG tablet Take 1 tablet (10 mg total) by mouth daily. For high cholesterol 30 tablet 0   bismuth subsalicylate (PEPTO BISMOL) 262 MG/15ML suspension Take 30  mLs by mouth every 6 (six) hours as needed for indigestion or diarrhea or loose stools.     calcium carbonate (TUMS - DOSED IN MG ELEMENTAL CALCIUM) 500 MG chewable tablet Chew 2 tablets by mouth daily as needed for indigestion or heartburn.     cetirizine (ZYRTEC) 10 MG tablet Take 10 mg by mouth daily as needed for allergies.      dicyclomine (BENTYL) 10 MG capsule Take 1 capsule (10 mg total) by mouth 3 (three) times daily as needed (diarrhea). 60 capsule 1   DULoxetine (CYMBALTA) 60 MG capsule Take 1 capsule (60 mg total) by mouth 2 (two) times daily. For mood control (Patient taking differently: Take 120 mg by mouth daily. For mood control) 60 capsule 0   gabapentin (NEURONTIN) 300 MG capsule One capsule in the morning and 2 in the evening (Patient taking differently: Take 300-600 mg by mouth See admin instructions. One capsule (300 mg) in the morning and 2 (600 mg) in  the evening) 270 capsule 1   glimepiride (AMARYL) 2 MG tablet Take 2 mg by mouth daily with breakfast.     letrozole (FEMARA) 2.5 MG tablet Take 1 tablet (2.5 mg total) by mouth daily. Stop the medication last week in February 2023 90 tablet 3   lisinopril (PRINIVIL,ZESTRIL) 20 MG tablet Take 1 tablet (20 mg total) by mouth daily. For high blood pressure 30 tablet 0   metFORMIN (GLUCOPHAGE) 500 MG tablet Take 1,000 mg by mouth 2 (two) times daily with a meal.      traZODone (DESYREL) 50 MG tablet Take 1 tablet (50 mg total) by mouth at bedtime as needed for sleep. 30 tablet 0   No current facility-administered medications for this visit.    Review of Systems: GENERAL: negative for malaise, night sweats HEENT: No changes in hearing or vision, no nose bleeds or other nasal problems. NECK: Negative for lumps, goiter, pain and significant neck swelling RESPIRATORY: Negative for cough, wheezing CARDIOVASCULAR: Negative for chest pain, leg swelling, palpitations, orthopnea GI: SEE HPI MUSCULOSKELETAL: Negative for joint pain or swelling, back pain, and muscle pain. SKIN: Negative for lesions, rash PSYCH: Negative for sleep disturbance, mood disorder and recent psychosocial stressors. HEMATOLOGY Negative for prolonged bleeding, bruising easily, and swollen nodes. ENDOCRINE: Negative for cold or heat intolerance, polyuria, polydipsia and goiter. NEURO: negative for tremor, gait imbalance, syncope and seizures. The remainder of the review of systems is noncontributory.   Physical Exam: BP 109/69 (BP Location: Left Arm, Patient Position: Sitting, Cuff Size: Large)    Pulse (!) 118    Temp 98.1 F (36.7 C) (Oral)    Ht 5' 7.5" (1.715 m)    Wt 261 lb 8 oz (118.6 kg)    LMP  (LMP Unknown)    BMI 40.35 kg/m  GENERAL: The patient is AO x3, in no acute distress. Obese  HEENT: Head is normocephalic and atraumatic. EOMI are intact. Mouth is well hydrated and without lesions. NECK: Supple. No  masses LUNGS: Clear to auscultation. No presence of rhonchi/wheezing/rales. Adequate chest expansion HEART: RRR, normal s1 and s2. ABDOMEN: diffusely tender, no guarding, no peritoneal signs, and nondistended. BS +. No masses. EXTREMITIES: Without any cyanosis, clubbing, rash, lesions or edema. NEUROLOGIC: AOx3, no focal motor deficit. SKIN: no jaundice, no rashes  Imaging/Labs: as above  I personally reviewed and interpreted the available labs, imaging and endoscopic files.  Impression and Plan: Lindsay Pacheco is a 60 y.o. female with PMH breast cancer sp lumpectomy and  radiotherapy, diabetes, depression, hyperlipidemia, hypertension, IBS-D, OSA, who presents for follow up of diarrhea and heartburn.  The patient has presented chronic episodes of diarrhea and bloating for multiple years that have worsened recently.  Etiologies such as celiac disease or microscopic colitis have been ruled out as a reason for her symptoms.  It is very likely her symptoms are related to IBS-D which has been poorly controlled.  She may have significant improvement with a 2-week trial of Xifaxan for IBS-D as a first-line treatment for her chronic condition.  We will check for gastrointestinal infections as she had worsening of her diarrhea recently.  We will also check for thyroid function test today.  She will also benefit from implementing a low FODMAP diet.  Nevertheless, we will investigate for other organic etiologies with an EGD, which will also help [place] evaluate her recurrent episodes of heartburn and upper abdominal complaints.  Also, the use of Bentyl may provide relief for her abdominal pain and distention.  She has also presented recurrent regurgitation and heartburn.  I will start her on omeprazole 40 mg every day for possible GERD and she was advised to continue with weight loss as these may alleviate some of her symptoms.  - Schedule EGD - Start Xifaxan for 2 weeks - Explained presumed etiology of IBS  symptoms. Patient was counseled about the benefit of implementing a low FODMAP to improve symptoms and recurrent episodes. A dietary list was provided to the patient. Also, the patient was counseled about the benefit of avoiding stressing situations and potential environmental triggers leading to symptomatology. - Start Bentyl 1 tablet q12h as needed for abdominal pain - Start omeprazole 40 mg qday - Explained presumed etiology of reflux symptoms. Instruction provided in the use of antireflux medication - patient should take medication in the morning 30-45 minutes before eating breakfast. Discussed avoidance of eating within 2 hours of lying down to sleep and benefit of blocks to elevate head of bed. Also, will benefit from avoiding carbonated drinks/sodas or food that has tomatoes, spicy or greasy food. - Patient encouraged to lose weight as this may provide benefit for her specific complaint but also due to other comorbidities - Check TSH - Check C. Diff and GI pathogen panel  All questions were answered.      Harvel Quale, MD Gastroenterology and Hepatology [facility]

## 2020-12-11 ENCOUNTER — Encounter (INDEPENDENT_AMBULATORY_CARE_PROVIDER_SITE_OTHER): Payer: Self-pay

## 2020-12-11 ENCOUNTER — Telehealth (INDEPENDENT_AMBULATORY_CARE_PROVIDER_SITE_OTHER): Payer: Self-pay

## 2020-12-11 LAB — TSH: TSH: 0.72 mIU/L (ref 0.40–4.50)

## 2020-12-11 NOTE — Telephone Encounter (Signed)
Patient made aware that her Doreene Nest was covered by insurance and advised to let us know if she cant afford. She states understanding.

## 2020-12-14 ENCOUNTER — Encounter (HOSPITAL_COMMUNITY)
Admission: RE | Admit: 2020-12-14 | Discharge: 2020-12-14 | Disposition: A | Discharge status: Home / Self Care | Payer: Medicare Other | Source: Ambulatory Visit | Attending: Gastroenterology | Admitting: Gastroenterology

## 2020-12-14 ENCOUNTER — Encounter (HOSPITAL_COMMUNITY): Payer: Self-pay

## 2020-12-14 ENCOUNTER — Other Ambulatory Visit: Payer: Self-pay

## 2020-12-14 DIAGNOSIS — Z01810 Encounter for preprocedural cardiovascular examination: Secondary | ICD-10-CM | POA: Insufficient documentation

## 2020-12-14 NOTE — Patient Instructions (Signed)
Lindsay Pacheco  12/14/2020     @PREFPERIOPPHARMACY @   Your procedure is scheduled on  12/16/2020.   Report to [facility] at  1130  A.M.   Call this number if you have problems the morning of surgery:  [phone]   Remember:  Follow the diet instructions given to you by the office.      DO NOT take any medication for diabetes the morning of your procedure.    Take these medicines the morning of surgery with A SIP OF WATER                   zyrtec, cymbalta, femara, prilosec, xifaxan.     Do not wear jewelry, make-up or nail polish.  Do not wear lotions, powders, or perfumes, or deodorant.  Do not shave 48 hours prior to surgery.  Men may shave face and neck.  Do not bring valuables to the hospital.  [facility] is not responsible for any belongings or valuables.  Contacts, dentures or bridgework may not be worn into surgery.  Leave your suitcase in the car.  After surgery it may be brought to your room.  For patients admitted to the hospital, discharge time will be determined by your treatment team.  Patients discharged the day of surgery will not be allowed to drive home and must have some one with them for 24 hours.    Special instructions:   DO NOT smoke tobacco or vape for 24 hours before your procedure.  Please read over the following fact sheets that you were given. Anesthesia Post-op Instructions and Care and Recovery After Surgery      Upper Endoscopy, Adult, Care After This sheet gives you information about how to care for yourself after your procedure. Your health care provider may also give you more specific instructions. If you have problems or questions, contact your health care provider. What can I expect after the procedure? After the procedure, it is common to have: A sore throat. Mild stomach pain or discomfort. Bloating. Nausea. Follow these instructions at home:  Follow instructions from your health care provider about what to eat  or drink after your procedure. Return to your normal activities as told by your health care provider. Ask your health care provider what activities are safe for you. Take over-the-counter and prescription medicines only as told by your health care provider. If you were given a sedative during the procedure, it can affect you for several hours. Do not drive or operate machinery until your health care provider says that it is safe. Keep all follow-up visits as told by your health care provider. This is important. Contact a health care provider if you have: A sore throat that lasts longer than one day. Trouble swallowing. Get help right away if: You vomit blood or your vomit looks like coffee grounds. You have: A fever. Bloody, black, or tarry stools. A severe sore throat or you cannot swallow. Difficulty breathing. Severe pain in your chest or abdomen. Summary After the procedure, it is common to have a sore throat, mild stomach discomfort, bloating, and nausea. If you were given a sedative during the procedure, it can affect you for several hours. Do not drive or operate machinery until your health care provider says that it is safe. Follow instructions from your health care provider about what to eat or drink after your procedure. Return to your normal activities as told by your health care provider. This  information is not intended to replace advice given to you by your health care provider. Make sure you discuss any questions you have with your health care provider. Document Revised: 10/26/2018 Document Reviewed: 05/22/2017 Elsevier Patient Education  2022 [place] After This sheet gives you information about how to care for yourself after your procedure. Your health care provider may also give you more specific instructions. If you have problems or questions, contact your health care provider. What can I expect after the procedure? After the  procedure, it is common to have: Tiredness. Forgetfulness about what happened after the procedure. Impaired judgment for important decisions. Nausea or vomiting. Some difficulty with balance. Follow these instructions at home: For the time period you were told by your health care provider:   Rest as needed. Do not participate in activities where you could fall or become injured. Do not drive or use machinery. Do not drink alcohol. Do not take sleeping pills or medicines that cause drowsiness. Do not make important decisions or sign legal documents. Do not take care of children on your own. Eating and drinking Follow the diet that is recommended by your health care provider. Drink enough fluid to keep your urine pale yellow. If you vomit: Drink water, juice, or soup when you can drink without vomiting. Make sure you have little or no nausea before eating solid foods. General instructions Have a responsible adult stay with you for the time you are told. It is important to have someone help care for you until you are awake and alert. Take over-the-counter and prescription medicines only as told by your health care provider. If you have sleep apnea, surgery and certain medicines can increase your risk for breathing problems. Follow instructions from your health care provider about wearing your sleep device: Anytime you are sleeping, including during daytime naps. While taking prescription pain medicines, sleeping medicines, or medicines that make you drowsy. Avoid smoking. Keep all follow-up visits as told by your health care provider. This is important. Contact a health care provider if: You keep feeling nauseous or you keep vomiting. You feel light-headed. You are still sleepy or having trouble with balance after 24 hours. You develop a rash. You have a fever. You have redness or swelling around the IV site. Get help right away if: You have trouble breathing. You have new-onset  confusion at home. Summary For several hours after your procedure, you may feel tired. You may also be forgetful and have poor judgment. Have a responsible adult stay with you for the time you are told. It is important to have someone help care for you until you are awake and alert. Rest as told. Do not drive or operate machinery. Do not drink alcohol or take sleeping pills. Get help right away if you have trouble breathing, or if you suddenly become confused. This information is not intended to replace advice given to you by your health care provider. Make sure you discuss any questions you have with your health care provider. Document Revised: 09/05/2019 Document Reviewed: 11/22/2018 Elsevier Patient Education  2022 [employer].

## 2020-12-16 ENCOUNTER — Ambulatory Visit (HOSPITAL_COMMUNITY): Payer: Medicare Other | Admitting: Anesthesiology

## 2020-12-16 ENCOUNTER — Encounter (HOSPITAL_COMMUNITY)
Admission: RE | Disposition: A | Discharge status: Home / Self Care | Payer: Self-pay | Source: Home / Self Care | Attending: Gastroenterology

## 2020-12-16 ENCOUNTER — Ambulatory Visit (HOSPITAL_COMMUNITY)
Admission: RE | Admit: 2020-12-16 | Discharge: 2020-12-16 | Disposition: A | Discharge status: Home / Self Care | Payer: Medicare Other | Attending: Gastroenterology | Admitting: Gastroenterology

## 2020-12-16 ENCOUNTER — Encounter (HOSPITAL_COMMUNITY): Payer: Self-pay | Admitting: Gastroenterology

## 2020-12-16 DIAGNOSIS — E119 Type 2 diabetes mellitus without complications: Secondary | ICD-10-CM | POA: Diagnosis not present

## 2020-12-16 DIAGNOSIS — Z923 Personal history of irradiation: Secondary | ICD-10-CM | POA: Insufficient documentation

## 2020-12-16 DIAGNOSIS — Z6841 Body Mass Index (BMI) 40.0 and over, adult: Secondary | ICD-10-CM | POA: Insufficient documentation

## 2020-12-16 DIAGNOSIS — K449 Diaphragmatic hernia without obstruction or gangrene: Secondary | ICD-10-CM | POA: Diagnosis not present

## 2020-12-16 DIAGNOSIS — C50919 Malignant neoplasm of unspecified site of unspecified female breast: Secondary | ICD-10-CM | POA: Diagnosis not present

## 2020-12-16 DIAGNOSIS — R12 Heartburn: Secondary | ICD-10-CM | POA: Diagnosis not present

## 2020-12-16 DIAGNOSIS — R140 Abdominal distension (gaseous): Secondary | ICD-10-CM | POA: Insufficient documentation

## 2020-12-16 DIAGNOSIS — G4730 Sleep apnea, unspecified: Secondary | ICD-10-CM | POA: Diagnosis not present

## 2020-12-16 DIAGNOSIS — K222 Esophageal obstruction: Secondary | ICD-10-CM | POA: Insufficient documentation

## 2020-12-16 DIAGNOSIS — G4733 Obstructive sleep apnea (adult) (pediatric): Secondary | ICD-10-CM | POA: Insufficient documentation

## 2020-12-16 DIAGNOSIS — Z9049 Acquired absence of other specified parts of digestive tract: Secondary | ICD-10-CM | POA: Diagnosis not present

## 2020-12-16 DIAGNOSIS — E785 Hyperlipidemia, unspecified: Secondary | ICD-10-CM | POA: Diagnosis not present

## 2020-12-16 DIAGNOSIS — R1013 Epigastric pain: Secondary | ICD-10-CM | POA: Diagnosis not present

## 2020-12-16 DIAGNOSIS — K649 Unspecified hemorrhoids: Secondary | ICD-10-CM | POA: Diagnosis not present

## 2020-12-16 DIAGNOSIS — R1010 Upper abdominal pain, unspecified: Secondary | ICD-10-CM | POA: Diagnosis not present

## 2020-12-16 DIAGNOSIS — Z79899 Other long term (current) drug therapy: Secondary | ICD-10-CM | POA: Insufficient documentation

## 2020-12-16 DIAGNOSIS — I10 Essential (primary) hypertension: Secondary | ICD-10-CM | POA: Diagnosis not present

## 2020-12-16 DIAGNOSIS — K580 Irritable bowel syndrome with diarrhea: Secondary | ICD-10-CM | POA: Insufficient documentation

## 2020-12-16 DIAGNOSIS — K2950 Unspecified chronic gastritis without bleeding: Secondary | ICD-10-CM | POA: Diagnosis not present

## 2020-12-16 DIAGNOSIS — K317 Polyp of stomach and duodenum: Secondary | ICD-10-CM

## 2020-12-16 DIAGNOSIS — K219 Gastro-esophageal reflux disease without esophagitis: Secondary | ICD-10-CM | POA: Diagnosis not present

## 2020-12-16 HISTORY — PX: ESOPHAGOGASTRODUODENOSCOPY (EGD) WITH PROPOFOL: SHX5813

## 2020-12-16 HISTORY — PX: BIOPSY: SHX5522

## 2020-12-16 LAB — GLUCOSE, CAPILLARY: Glucose-Capillary: 126 mg/dL — ABNORMAL HIGH (ref 70–99)

## 2020-12-16 SURGERY — ESOPHAGOGASTRODUODENOSCOPY (EGD) WITH PROPOFOL
Anesthesia: General

## 2020-12-16 MED ORDER — LACTATED RINGERS IV SOLN: INTRAVENOUS | Status: DC

## 2020-12-16 MED ORDER — PROPOFOL 10 MG/ML IV BOLUS
INTRAVENOUS | Status: DC | PRN
  Administered 2020-12-16: 100 mg via INTRAVENOUS
  Administered 2020-12-16 (×2): 30 mg via INTRAVENOUS
  Administered 2020-12-16 (×2): 20 mg via INTRAVENOUS

## 2020-12-16 MED ORDER — LIDOCAINE HCL (CARDIAC) PF 100 MG/5ML IV SOSY
PREFILLED_SYRINGE | INTRAVENOUS | Status: DC | PRN
  Administered 2020-12-16: 50 mg via INTRAVENOUS

## 2020-12-16 NOTE — Op Note (Addendum)
[facility] Patient Name: Lindsay Pacheco Procedure Date: 12/16/2020 1:18 PM MRN: [id number] Date of Birth: April 14, 1960 Attending MD: Maylon Peppers ,  CSN: [id number] Age: 60 Admit Type: Outpatient Procedure:                Upper GI endoscopy Indications:              Heartburn, Abdominal bloating Providers:                Maylon Peppers, Lurline Del, RN, Kristine L. Risa Grill, Technician Referring MD:              Medicines:                Monitored Anesthesia Care Complications:            No immediate complications. Estimated Blood Loss:     Estimated blood loss: none. Procedure:                Pre-Anesthesia Assessment:                           - Prior to the procedure, a History and Physical                            was performed, and patient medications, allergies                            and sensitivities were reviewed. The patient's                            tolerance of previous anesthesia was reviewed.                           - The risks and benefits of the procedure and the                            sedation options and risks were discussed with the                            patient. All questions were answered and informed                            consent was obtained.                           - ASA Grade Assessment: II - A patient with mild                            systemic disease.                           After obtaining informed consent, the endoscope was                            passed under direct vision. Throughout the  procedure, the patient's blood pressure, pulse, and                            oxygen saturations were monitored continuously. The                            [id number]-[id number] ([id number]) scope was introduced through the                            mouth, and advanced to the second part of duodenum.                            The upper GI endoscopy was accomplished without                             difficulty. The patient tolerated the procedure                            well. Scope In: 1:28:31 PM Scope Out: 1:37:46 PM Total Procedure Duration: 0 hours 9 minutes 15 seconds  Findings:      A 1 cm hiatal hernia was present.      A non-obstructing Schatzki ring was found at the gastroesophageal       junction. This was disrupted with a forceps.      Multiple small sessile fundic gland polyps with no bleeding were found       in the gastric fundus. Biopsies were taken with a cold forceps for       Helicobacter pylori testing.      The examined duodenum was normal. Biopsies were taken with a cold       forceps for histology. Impression:               - 1 cm hiatal hernia.                           - Non-obstructing Schatzki ring.                           - Multiple fundic gland polyps. Biopsied.                           - Normal examined duodenum. Biopsied. Moderate Sedation:      Per Anesthesia Care Recommendation:           - Discharge patient to home (ambulatory).                           - Resume previous diet.                           - Await pathology results.                           - Continue omeprazole 40 mg qday. Procedure Code(s):        --- Professional ---  82505, Esophagogastroduodenoscopy, flexible,                            transoral; with biopsy, single or multiple Diagnosis Code(s):        --- Professional ---                           K44.9, Diaphragmatic hernia without obstruction or                            gangrene                           K22.2, Esophageal obstruction                           K31.7, Polyp of stomach and duodenum                           R12, Heartburn                           R14.0, Abdominal distension (gaseous) CPT copyright 2019 American Medical Association. All rights reserved. The codes documented in this report are preliminary and upon coder review may  be revised to meet current  compliance requirements. Maylon Peppers, MD Maylon Peppers,  12/16/2020 1:43:32 PM This report has been signed electronically. Number of Addenda: 0

## 2020-12-16 NOTE — Transfer of Care (Signed)
Immediate Anesthesia Transfer of Care Note  Patient: Lindsay Pacheco  Procedure(s) Performed: ESOPHAGOGASTRODUODENOSCOPY (EGD) WITH PROPOFOL BIOPSY  Patient Location: Short Stay  Anesthesia Type:General  Level of Consciousness: awake, alert  and oriented  Airway & Oxygen Therapy: Patient Spontanous Breathing  Post-op Assessment: Report given to RN and Post -op Vital signs reviewed and stable  Post vital signs: Reviewed and stable  Last Vitals:  Vitals Value Taken Time  BP 121/64 12/16/20 1342  Temp 36.4 C 12/16/20 1342  Pulse 93 12/16/20 1342  Resp 18 12/16/20 1342  SpO2 99 % 12/16/20 1342    Last Pain:  Vitals:   12/16/20 1342  TempSrc: Oral  PainSc: 0-No pain      Patients Stated Pain Goal: 8 (44/36/01 6580)  Complications: No notable events documented.

## 2020-12-16 NOTE — Anesthesia Postprocedure Evaluation (Signed)
Anesthesia Post Note  Patient: Lindsay Pacheco  Procedure(s) Performed: ESOPHAGOGASTRODUODENOSCOPY (EGD) WITH PROPOFOL BIOPSY  Patient location during evaluation: Phase II Anesthesia Type: General Level of consciousness: awake and alert and oriented Pain management: pain level controlled Vital Signs Assessment: post-procedure vital signs reviewed and stable Respiratory status: spontaneous breathing, nonlabored ventilation and respiratory function stable Cardiovascular status: blood pressure returned to baseline and stable Postop Assessment: no apparent nausea or vomiting Anesthetic complications: no   No notable events documented.   Last Vitals:  Vitals:   12/16/20 1200 12/16/20 1342  BP:  121/64  Pulse: 87 93  Resp: 16 18  Temp:  36.4 C  SpO2: 95% 99%    Last Pain:  Vitals:   12/16/20 1342  TempSrc: Oral  PainSc: 0-No pain                 Mishka Stegemann C Aidenn Skellenger

## 2020-12-16 NOTE — Anesthesia Preprocedure Evaluation (Signed)
Anesthesia Evaluation  Patient identified by MRN, date of birth, ID band Patient awake    Reviewed: Allergy & Precautions, NPO status , Patient's Chart, lab work & pertinent test results  Airway Mallampati: III  TM Distance: >3 FB Neck ROM: Full    Dental  (+) Dental Advisory Given, Teeth Intact   Pulmonary shortness of breath and with exertion, sleep apnea ,    Pulmonary exam normal breath sounds clear to auscultation       Cardiovascular hypertension, Pt. on medications Normal cardiovascular exam Rhythm:Regular Rate:Normal     Neuro/Psych PSYCHIATRIC DISORDERS Anxiety Depression    GI/Hepatic Neg liver ROS, GERD  Medicated,  Endo/Other  diabetes, Well Controlled, Type 2, Oral Hypoglycemic AgentsMorbid obesity  Renal/GU negative Renal ROS     Musculoskeletal  (+) Arthritis , Osteoarthritis,    Abdominal   Peds  Hematology   Anesthesia Other Findings   Reproductive/Obstetrics                            Anesthesia Physical Anesthesia Plan  ASA: 3  Anesthesia Plan: General   Post-op Pain Management: Minimal or no pain anticipated   Induction: Intravenous  PONV Risk Score and Plan: TIVA  Airway Management Planned: Nasal Cannula and Natural Airway  Additional Equipment:   Intra-op Plan:   Post-operative Plan:   Informed Consent: I have reviewed the patients History and Physical, chart, labs and discussed the procedure including the risks, benefits and alternatives for the proposed anesthesia with the patient or authorized representative who has indicated his/her understanding and acceptance.     Dental advisory given  Plan Discussed with: CRNA and Surgeon  Anesthesia Plan Comments:         Anesthesia Quick Evaluation

## 2020-12-16 NOTE — Interval H&P Note (Signed)
History and Physical Interval Note:  12/16/2020 12:36 PM  Lindsay Pacheco  has presented today for surgery, with the diagnosis of Epigastric Pain Bloating GERD.  The various methods of treatment have been discussed with the patient and family. After consideration of risks, benefits and other options for treatment, the patient has consented to  Procedure(s) with comments: ESOPHAGOGASTRODUODENOSCOPY (EGD) WITH PROPOFOL (N/A) - 1:00 as a surgical intervention.  The patient's history has been reviewed, patient examined, no change in status, stable for surgery.  I have reviewed the patient's chart and labs.  Questions were answered to the patient's satisfaction.     Maylon Peppers Mayorga

## 2020-12-16 NOTE — Discharge Instructions (Signed)
You are being discharged to home.  Resume your previous diet.  We are waiting for your pathology results.  Continue omeprazole 40 mg qday.

## 2020-12-17 LAB — SURGICAL PATHOLOGY

## 2020-12-21 ENCOUNTER — Encounter (HOSPITAL_COMMUNITY): Payer: Self-pay | Admitting: Gastroenterology

## 2021-01-19 DIAGNOSIS — M9901 Segmental and somatic dysfunction of cervical region: Secondary | ICD-10-CM | POA: Diagnosis not present

## 2021-01-19 DIAGNOSIS — M9903 Segmental and somatic dysfunction of lumbar region: Secondary | ICD-10-CM | POA: Diagnosis not present

## 2021-01-19 DIAGNOSIS — M47816 Spondylosis without myelopathy or radiculopathy, lumbar region: Secondary | ICD-10-CM | POA: Diagnosis not present

## 2021-01-19 DIAGNOSIS — S233XXA Sprain of ligaments of thoracic spine, initial encounter: Secondary | ICD-10-CM | POA: Diagnosis not present

## 2021-01-19 DIAGNOSIS — M47812 Spondylosis without myelopathy or radiculopathy, cervical region: Secondary | ICD-10-CM | POA: Diagnosis not present

## 2021-01-19 DIAGNOSIS — M9902 Segmental and somatic dysfunction of thoracic region: Secondary | ICD-10-CM | POA: Diagnosis not present

## 2021-01-19 DIAGNOSIS — M5442 Lumbago with sciatica, left side: Secondary | ICD-10-CM | POA: Diagnosis not present

## 2021-01-22 DIAGNOSIS — M9901 Segmental and somatic dysfunction of cervical region: Secondary | ICD-10-CM | POA: Diagnosis not present

## 2021-01-22 DIAGNOSIS — M5442 Lumbago with sciatica, left side: Secondary | ICD-10-CM | POA: Diagnosis not present

## 2021-01-22 DIAGNOSIS — M47812 Spondylosis without myelopathy or radiculopathy, cervical region: Secondary | ICD-10-CM | POA: Diagnosis not present

## 2021-01-22 DIAGNOSIS — S233XXA Sprain of ligaments of thoracic spine, initial encounter: Secondary | ICD-10-CM | POA: Diagnosis not present

## 2021-01-22 DIAGNOSIS — M9903 Segmental and somatic dysfunction of lumbar region: Secondary | ICD-10-CM | POA: Diagnosis not present

## 2021-01-22 DIAGNOSIS — M47816 Spondylosis without myelopathy or radiculopathy, lumbar region: Secondary | ICD-10-CM | POA: Diagnosis not present

## 2021-01-22 DIAGNOSIS — M9902 Segmental and somatic dysfunction of thoracic region: Secondary | ICD-10-CM | POA: Diagnosis not present

## 2021-02-10 ENCOUNTER — Encounter (INDEPENDENT_AMBULATORY_CARE_PROVIDER_SITE_OTHER): Payer: Medicare Other | Admitting: Ophthalmology

## 2021-02-10 ENCOUNTER — Other Ambulatory Visit: Payer: Self-pay

## 2021-02-10 DIAGNOSIS — H35342 Macular cyst, hole, or pseudohole, left eye: Secondary | ICD-10-CM | POA: Diagnosis not present

## 2021-02-10 DIAGNOSIS — H35033 Hypertensive retinopathy, bilateral: Secondary | ICD-10-CM

## 2021-02-10 DIAGNOSIS — H43811 Vitreous degeneration, right eye: Secondary | ICD-10-CM

## 2021-02-10 DIAGNOSIS — I10 Essential (primary) hypertension: Secondary | ICD-10-CM

## 2021-02-10 DIAGNOSIS — H2511 Age-related nuclear cataract, right eye: Secondary | ICD-10-CM | POA: Diagnosis not present

## 2021-02-11 DIAGNOSIS — M47812 Spondylosis without myelopathy or radiculopathy, cervical region: Secondary | ICD-10-CM | POA: Diagnosis not present

## 2021-02-11 DIAGNOSIS — M5442 Lumbago with sciatica, left side: Secondary | ICD-10-CM | POA: Diagnosis not present

## 2021-02-11 DIAGNOSIS — S233XXA Sprain of ligaments of thoracic spine, initial encounter: Secondary | ICD-10-CM | POA: Diagnosis not present

## 2021-02-11 DIAGNOSIS — M9901 Segmental and somatic dysfunction of cervical region: Secondary | ICD-10-CM | POA: Diagnosis not present

## 2021-02-11 DIAGNOSIS — M47816 Spondylosis without myelopathy or radiculopathy, lumbar region: Secondary | ICD-10-CM | POA: Diagnosis not present

## 2021-02-11 DIAGNOSIS — M9903 Segmental and somatic dysfunction of lumbar region: Secondary | ICD-10-CM | POA: Diagnosis not present

## 2021-02-11 DIAGNOSIS — M9902 Segmental and somatic dysfunction of thoracic region: Secondary | ICD-10-CM | POA: Diagnosis not present

## 2021-02-16 DIAGNOSIS — I10 Essential (primary) hypertension: Secondary | ICD-10-CM | POA: Diagnosis not present

## 2021-02-16 DIAGNOSIS — Z789 Other specified health status: Secondary | ICD-10-CM | POA: Diagnosis not present

## 2021-02-16 DIAGNOSIS — E1165 Type 2 diabetes mellitus with hyperglycemia: Secondary | ICD-10-CM | POA: Diagnosis not present

## 2021-02-16 DIAGNOSIS — E7800 Pure hypercholesterolemia, unspecified: Secondary | ICD-10-CM | POA: Diagnosis not present

## 2021-02-16 DIAGNOSIS — C50919 Malignant neoplasm of unspecified site of unspecified female breast: Secondary | ICD-10-CM | POA: Diagnosis not present

## 2021-02-16 DIAGNOSIS — Z299 Encounter for prophylactic measures, unspecified: Secondary | ICD-10-CM | POA: Diagnosis not present

## 2021-03-01 DIAGNOSIS — M7592 Shoulder lesion, unspecified, left shoulder: Secondary | ICD-10-CM | POA: Diagnosis not present

## 2021-03-01 DIAGNOSIS — M7552 Bursitis of left shoulder: Secondary | ICD-10-CM | POA: Diagnosis not present

## 2021-03-03 DIAGNOSIS — M7552 Bursitis of left shoulder: Secondary | ICD-10-CM | POA: Diagnosis not present

## 2021-03-05 DIAGNOSIS — M7552 Bursitis of left shoulder: Secondary | ICD-10-CM | POA: Diagnosis not present

## 2021-03-09 DIAGNOSIS — M7552 Bursitis of left shoulder: Secondary | ICD-10-CM | POA: Diagnosis not present

## 2021-03-11 ENCOUNTER — Other Ambulatory Visit: Payer: Self-pay | Admitting: Adult Health

## 2021-03-11 DIAGNOSIS — C50212 Malignant neoplasm of upper-inner quadrant of left female breast: Secondary | ICD-10-CM

## 2021-03-12 DIAGNOSIS — M7552 Bursitis of left shoulder: Secondary | ICD-10-CM | POA: Diagnosis not present

## 2021-03-15 ENCOUNTER — Other Ambulatory Visit: Payer: Self-pay

## 2021-03-15 ENCOUNTER — Ambulatory Visit
Admission: RE | Admit: 2021-03-15 | Discharge: 2021-03-15 | Disposition: A | Discharge status: Home / Self Care | Payer: Medicare Other | Source: Ambulatory Visit | Attending: Oncology | Admitting: Oncology

## 2021-03-15 DIAGNOSIS — Z170 Estrogen receptor positive status [ER+]: Secondary | ICD-10-CM

## 2021-03-15 DIAGNOSIS — Z780 Asymptomatic menopausal state: Secondary | ICD-10-CM | POA: Diagnosis not present

## 2021-03-15 DIAGNOSIS — M8589 Other specified disorders of bone density and structure, multiple sites: Secondary | ICD-10-CM | POA: Diagnosis not present

## 2021-03-15 DIAGNOSIS — C50212 Malignant neoplasm of upper-inner quadrant of left female breast: Secondary | ICD-10-CM

## 2021-03-16 ENCOUNTER — Telehealth: Payer: Self-pay

## 2021-03-16 DIAGNOSIS — M7552 Bursitis of left shoulder: Secondary | ICD-10-CM | POA: Diagnosis not present

## 2021-03-16 NOTE — Telephone Encounter (Signed)
-----   Message from Gardenia Phlegm, NP sent at 03/15/2021  8:43 PM EDT ----- ?Please let patient know that results are consistent with osteopenia, she should continue calcium, vitamin d, weight bearing exercises.  We should repeat testing in 2 years.  If she has any quesitons or wants to talk about further feel free to set her up for a telephone office visit with me.   ?----- Message ----- ?From: Interface, Rad Results In ?Sent: 03/15/2021  11:37 AM EDT ?To: Gardenia Phlegm, NP ? ? ?

## 2021-03-16 NOTE — Telephone Encounter (Signed)
Called and spoke with pt, informed her that per [facility] bone density shows osteopenia.  Recommended calcium, vitamin d and weight bearing exercises.  Repeat in 2 years. Pt verbalized understanding and thanks.   ?

## 2021-03-18 DIAGNOSIS — M7552 Bursitis of left shoulder: Secondary | ICD-10-CM | POA: Diagnosis not present

## 2021-03-23 DIAGNOSIS — M7552 Bursitis of left shoulder: Secondary | ICD-10-CM | POA: Diagnosis not present

## 2021-03-25 DIAGNOSIS — M7552 Bursitis of left shoulder: Secondary | ICD-10-CM | POA: Diagnosis not present

## 2021-04-01 DIAGNOSIS — M7552 Bursitis of left shoulder: Secondary | ICD-10-CM | POA: Diagnosis not present

## 2021-04-06 ENCOUNTER — Ambulatory Visit (INDEPENDENT_AMBULATORY_CARE_PROVIDER_SITE_OTHER): Payer: Medicare Other | Admitting: Internal Medicine

## 2021-04-06 DIAGNOSIS — M7552 Bursitis of left shoulder: Secondary | ICD-10-CM | POA: Diagnosis not present

## 2021-04-08 DIAGNOSIS — M7552 Bursitis of left shoulder: Secondary | ICD-10-CM | POA: Diagnosis not present

## 2021-04-09 DIAGNOSIS — M7552 Bursitis of left shoulder: Secondary | ICD-10-CM | POA: Diagnosis not present

## 2021-04-09 DIAGNOSIS — M7592 Shoulder lesion, unspecified, left shoulder: Secondary | ICD-10-CM | POA: Diagnosis not present

## 2021-04-14 DIAGNOSIS — M7552 Bursitis of left shoulder: Secondary | ICD-10-CM | POA: Diagnosis not present

## 2021-04-16 DIAGNOSIS — M7552 Bursitis of left shoulder: Secondary | ICD-10-CM | POA: Diagnosis not present

## 2021-04-20 DIAGNOSIS — M7552 Bursitis of left shoulder: Secondary | ICD-10-CM | POA: Diagnosis not present

## 2021-04-22 DIAGNOSIS — M7552 Bursitis of left shoulder: Secondary | ICD-10-CM | POA: Diagnosis not present

## 2021-04-30 DIAGNOSIS — M1712 Unilateral primary osteoarthritis, left knee: Secondary | ICD-10-CM | POA: Diagnosis not present

## 2021-04-30 DIAGNOSIS — M25562 Pain in left knee: Secondary | ICD-10-CM | POA: Diagnosis not present

## 2021-05-14 DIAGNOSIS — M1712 Unilateral primary osteoarthritis, left knee: Secondary | ICD-10-CM | POA: Diagnosis not present

## 2021-05-20 DIAGNOSIS — M1712 Unilateral primary osteoarthritis, left knee: Secondary | ICD-10-CM | POA: Diagnosis not present

## 2021-06-01 DIAGNOSIS — E1165 Type 2 diabetes mellitus with hyperglycemia: Secondary | ICD-10-CM | POA: Diagnosis not present

## 2021-06-01 DIAGNOSIS — I10 Essential (primary) hypertension: Secondary | ICD-10-CM | POA: Diagnosis not present

## 2021-06-01 DIAGNOSIS — Z299 Encounter for prophylactic measures, unspecified: Secondary | ICD-10-CM | POA: Diagnosis not present

## 2021-06-30 DIAGNOSIS — Z789 Other specified health status: Secondary | ICD-10-CM | POA: Diagnosis not present

## 2021-06-30 DIAGNOSIS — Z299 Encounter for prophylactic measures, unspecified: Secondary | ICD-10-CM | POA: Diagnosis not present

## 2021-06-30 DIAGNOSIS — E7800 Pure hypercholesterolemia, unspecified: Secondary | ICD-10-CM | POA: Diagnosis not present

## 2021-06-30 DIAGNOSIS — Z7189 Other specified counseling: Secondary | ICD-10-CM | POA: Diagnosis not present

## 2021-06-30 DIAGNOSIS — R5383 Other fatigue: Secondary | ICD-10-CM | POA: Diagnosis not present

## 2021-06-30 DIAGNOSIS — Z0000 Encounter for general adult medical examination without abnormal findings: Secondary | ICD-10-CM | POA: Diagnosis not present

## 2021-06-30 DIAGNOSIS — I10 Essential (primary) hypertension: Secondary | ICD-10-CM | POA: Diagnosis not present

## 2021-07-01 DIAGNOSIS — M1712 Unilateral primary osteoarthritis, left knee: Secondary | ICD-10-CM | POA: Diagnosis not present

## 2021-07-16 DIAGNOSIS — Z7952 Long term (current) use of systemic steroids: Secondary | ICD-10-CM | POA: Diagnosis not present

## 2021-07-16 DIAGNOSIS — M859 Disorder of bone density and structure, unspecified: Secondary | ICD-10-CM | POA: Diagnosis not present

## 2021-07-16 DIAGNOSIS — Z79899 Other long term (current) drug therapy: Secondary | ICD-10-CM | POA: Diagnosis not present

## 2021-07-20 DIAGNOSIS — Z853 Personal history of malignant neoplasm of breast: Secondary | ICD-10-CM | POA: Diagnosis not present

## 2021-08-23 DIAGNOSIS — Z853 Personal history of malignant neoplasm of breast: Secondary | ICD-10-CM | POA: Diagnosis not present

## 2021-08-23 DIAGNOSIS — Z780 Asymptomatic menopausal state: Secondary | ICD-10-CM | POA: Diagnosis not present

## 2021-09-02 DIAGNOSIS — E119 Type 2 diabetes mellitus without complications: Secondary | ICD-10-CM | POA: Diagnosis not present

## 2021-09-08 DIAGNOSIS — I10 Essential (primary) hypertension: Secondary | ICD-10-CM | POA: Diagnosis not present

## 2021-09-08 DIAGNOSIS — E1165 Type 2 diabetes mellitus with hyperglycemia: Secondary | ICD-10-CM | POA: Diagnosis not present

## 2021-09-08 DIAGNOSIS — E1129 Type 2 diabetes mellitus with other diabetic kidney complication: Secondary | ICD-10-CM | POA: Diagnosis not present

## 2021-09-08 DIAGNOSIS — Z23 Encounter for immunization: Secondary | ICD-10-CM | POA: Diagnosis not present

## 2021-09-08 DIAGNOSIS — Z299 Encounter for prophylactic measures, unspecified: Secondary | ICD-10-CM | POA: Diagnosis not present

## 2021-09-08 DIAGNOSIS — R809 Proteinuria, unspecified: Secondary | ICD-10-CM | POA: Diagnosis not present

## 2021-09-14 DIAGNOSIS — Z01818 Encounter for other preprocedural examination: Secondary | ICD-10-CM | POA: Diagnosis not present

## 2021-09-14 DIAGNOSIS — E139 Other specified diabetes mellitus without complications: Secondary | ICD-10-CM | POA: Diagnosis not present

## 2021-09-15 DIAGNOSIS — G4733 Obstructive sleep apnea (adult) (pediatric): Secondary | ICD-10-CM | POA: Diagnosis not present

## 2021-09-15 DIAGNOSIS — Z882 Allergy status to sulfonamides status: Secondary | ICD-10-CM | POA: Diagnosis not present

## 2021-09-15 DIAGNOSIS — R9389 Abnormal findings on diagnostic imaging of other specified body structures: Secondary | ICD-10-CM | POA: Diagnosis not present

## 2021-09-15 DIAGNOSIS — Z7984 Long term (current) use of oral hypoglycemic drugs: Secondary | ICD-10-CM | POA: Diagnosis not present

## 2021-09-15 DIAGNOSIS — I10 Essential (primary) hypertension: Secondary | ICD-10-CM | POA: Diagnosis not present

## 2021-09-15 DIAGNOSIS — Z7982 Long term (current) use of aspirin: Secondary | ICD-10-CM | POA: Diagnosis not present

## 2021-09-15 DIAGNOSIS — E785 Hyperlipidemia, unspecified: Secondary | ICD-10-CM | POA: Diagnosis not present

## 2021-09-15 DIAGNOSIS — E119 Type 2 diabetes mellitus without complications: Secondary | ICD-10-CM | POA: Diagnosis not present

## 2021-09-27 DIAGNOSIS — Z86010 Personal history of colonic polyps: Secondary | ICD-10-CM | POA: Diagnosis not present

## 2021-09-27 DIAGNOSIS — R197 Diarrhea, unspecified: Secondary | ICD-10-CM | POA: Diagnosis not present

## 2021-09-27 DIAGNOSIS — K648 Other hemorrhoids: Secondary | ICD-10-CM | POA: Diagnosis not present

## 2021-09-27 DIAGNOSIS — R109 Unspecified abdominal pain: Secondary | ICD-10-CM | POA: Diagnosis not present

## 2021-10-14 ENCOUNTER — Other Ambulatory Visit: Payer: Self-pay | Admitting: Obstetrics

## 2021-10-14 ENCOUNTER — Other Ambulatory Visit: Payer: Self-pay | Admitting: Nurse Practitioner

## 2021-10-14 DIAGNOSIS — R928 Other abnormal and inconclusive findings on diagnostic imaging of breast: Secondary | ICD-10-CM

## 2021-10-20 DIAGNOSIS — G4733 Obstructive sleep apnea (adult) (pediatric): Secondary | ICD-10-CM | POA: Diagnosis not present

## 2021-10-20 DIAGNOSIS — Z299 Encounter for prophylactic measures, unspecified: Secondary | ICD-10-CM | POA: Diagnosis not present

## 2021-10-20 DIAGNOSIS — I10 Essential (primary) hypertension: Secondary | ICD-10-CM | POA: Diagnosis not present

## 2021-10-20 DIAGNOSIS — Z0000 Encounter for general adult medical examination without abnormal findings: Secondary | ICD-10-CM | POA: Diagnosis not present

## 2021-10-20 DIAGNOSIS — Z789 Other specified health status: Secondary | ICD-10-CM | POA: Diagnosis not present

## 2021-10-20 DIAGNOSIS — N644 Mastodynia: Secondary | ICD-10-CM | POA: Diagnosis not present

## 2021-10-29 ENCOUNTER — Other Ambulatory Visit: Payer: Self-pay | Admitting: Nurse Practitioner

## 2021-10-29 DIAGNOSIS — N644 Mastodynia: Secondary | ICD-10-CM

## 2021-11-03 DIAGNOSIS — M1710 Unilateral primary osteoarthritis, unspecified knee: Secondary | ICD-10-CM | POA: Diagnosis not present

## 2021-11-03 DIAGNOSIS — I10 Essential (primary) hypertension: Secondary | ICD-10-CM | POA: Diagnosis not present

## 2021-11-03 DIAGNOSIS — E1165 Type 2 diabetes mellitus with hyperglycemia: Secondary | ICD-10-CM | POA: Diagnosis not present

## 2021-11-03 DIAGNOSIS — Z299 Encounter for prophylactic measures, unspecified: Secondary | ICD-10-CM | POA: Diagnosis not present

## 2021-11-09 ENCOUNTER — Other Ambulatory Visit (INDEPENDENT_AMBULATORY_CARE_PROVIDER_SITE_OTHER): Payer: Self-pay | Admitting: Gastroenterology

## 2021-11-09 DIAGNOSIS — K219 Gastro-esophageal reflux disease without esophagitis: Secondary | ICD-10-CM

## 2021-11-09 NOTE — H&P (Signed)
TOTAL KNEE ADMISSION H&P  Patient is being admitted for left total knee arthroplasty.  Subjective:  Chief Complaint: Left knee pain.  HPI: Lindsay Pacheco, 61 y.o. female has a history of pain and functional disability in the left knee due to arthritis and has failed non-surgical conservative treatments for greater than 12 weeks to include viscosupplementation injections and activity modification. Onset of symptoms was gradual, starting  several  years ago with gradually worsening course since that time. The patient noted no past surgery on the left knee.  Patient currently rates pain in the left knee at 7 out of 10 with activity. Patient has night pain, worsening of pain with activity and weight bearing, pain with passive range of motion, and crepitus. Patient has evidence of  bone-on-bone arthritis in the medial and patellofemoral compartments of the left knee  by imaging studies. There is no active infection.  Patient Active Problem List   Diagnosis Date Noted   IBS (irritable bowel syndrome) 12/10/2020   Bloating 12/10/2020   GERD (gastroesophageal reflux disease) 12/10/2020   Chronic diarrhea 12/10/2020   Genetic testing 02/11/2020   Family history of breast cancer    Family history of polyps in the colon    OSA (obstructive sleep apnea) 02/25/2019   Macular hole, left eye 07/03/2018   Borderline personality disorder ([place]) 06/15/2017   Moderate episode of recurrent major depressive disorder ([place]) 10/22/2015   Malignant neoplasm of upper-inner quadrant of left breast in female, estrogen receptor positive ([place]) 10/14/2015   Diabetes ([place]) 09/24/2015   Hypertension     Past Medical History:  Diagnosis Date   Anxiety    Arthritis    "knees" (11/02/2015)   Breast cancer ([place]) 2017   left breast    Breast cancer, left breast ([place]) 09/30/2015   Cataracts, bilateral    just watching right now   Depression    Diabetes mellitus, type II ([place])    Diverticulitis 1987   Family history  of breast cancer    Family history of polyps in the colon    Hyperlipidemia    Hypertension    Irritable bowel syndrome (IBS)    Personal history of radiation therapy    Sleep apnea    does not use CPAP but "I'm suppose to" (11/02/2015)   SVD (spontaneous vaginal delivery)    x 1    Past Surgical History:  Procedure Laterality Date   25 GAUGE PARS PLANA VITRECTOMY WITH 20 GAUGE MVR PORT FOR MACULAR HOLE Left 07/03/2018   Procedure: 25 GAUGE PARS PLANA VITRECTOMY WITH 20 GAUGE MVR PORT FOR MACULAR HOLE;  Surgeon: [name], MD;  Location: [facility];  Service: Ophthalmology;  Laterality: Left;   BIOPSY  12/18/2019   Procedure: BIOPSY;  Surgeon: [name], MD;  [facility];  Service: Endoscopy;;  colon   BIOPSY  12/16/2020   Procedure: BIOPSY;  Surgeon: Harvel Quale, MD;  [facility];  Service: Gastroenterology;;   BREAST BIOPSY Left 09/2015   BREAST LUMPECTOMY Left 11/02/2015   BREAST LUMPECTOMY WITH AXILLARY LYMPH NODE BIOPSY Left 11/02/2015   BREAST LUMPECTOMY WITH BRACKETED RADIOACTIVE SEED AND LEFT AXILLARY SENTINEL LYMPH NODE BIOPSY    BREAST LUMPECTOMY WITH RADIOACTIVE SEED AND SENTINEL LYMPH NODE BIOPSY Left 11/02/2015   Procedure: LEFT BREAST LUMPECTOMY WITH BRACKETED RADIOACTIVE SEED AND LEFT AXILLARY SENTINEL LYMPH NODE BIOPSY;  Surgeon: [name], MD;  Location: [place];  Service: General;  Laterality: Left;   COLONOSCOPY  N/A 12/09/2015   Procedure: COLONOSCOPY;  Surgeon: [name], MD;  [facility];  Service: Endoscopy;  Laterality: N/A;  1:00   COLONOSCOPY WITH PROPOFOL N/A 12/18/2019   Procedure: COLONOSCOPY WITH PROPOFOL;  Surgeon: [name], MD;  [facility];  Service: Endoscopy;  Laterality: N/A;  1030   ESOPHAGOGASTRODUODENOSCOPY (EGD) WITH PROPOFOL N/A 12/16/2020   Procedure: ESOPHAGOGASTRODUODENOSCOPY (EGD) WITH PROPOFOL;  Surgeon: Harvel Quale, MD;   [facility];  Service: Gastroenterology;  Laterality: N/A;  1:00   EVACUATION BREAST HEMATOMA Left 11/02/2015   EVACUATION BREAST HEMATOMA Left 11/02/2015   Procedure: EVACUATION HEMATOMA BREAST;  Surgeon: [name], MD;  Location: [facility];  Service: General;  Laterality: Left;   GAS/FLUID EXCHANGE Left 07/03/2018   Procedure: Gas/Fluid Exchange;  Surgeon: [name], MD;  Location: [place];  Service: Ophthalmology;  Laterality: Left;   JOINT REPLACEMENT     KNEE ARTHROSCOPY Right 2000s   LAPAROSCOPIC CHOLECYSTECTOMY  1995   PHOTOCOAGULATION WITH LASER Left 07/03/2018   Procedure: Photocoagulation With Laser;  Surgeon: [name], MD;  Location: [facility];  Service: Ophthalmology;  Laterality: Left;   POLYPECTOMY  12/09/2015   Procedure: POLYPECTOMY; colon polyp  Surgeon: [name], MD;  [facility];  Service: Endoscopy;;  multiple   TOTAL KNEE ARTHROPLASTY Right 2011   WISDOM TOOTH EXTRACTION      Prior to Admission medications   Medication Sig Start Date End Date Taking? Authorizing Provider  acetaminophen (TYLENOL) 500 MG tablet Take 1,000 mg by mouth every 6 (six) hours as needed for headache or moderate pain.    [provider]  aspirin 81 MG tablet Take 1 tablet (81 mg total) by mouth daily. 12/19/19   Rehman, Mechele Dawley, MD  atorvastatin (LIPITOR) 10 MG tablet Take 1 tablet (10 mg total) by mouth daily. For high cholesterol 06/08/17   Money, Lowry Ram, FNP  bismuth subsalicylate (PEPTO BISMOL) 262 MG/15ML suspension Take 30 mLs by mouth every 6 (six) hours as needed for indigestion or diarrhea or loose stools.    [provider]  cetirizine (ZYRTEC) 10 MG tablet Take 10 mg by mouth daily.    [provider]  dicyclomine (BENTYL) 10 MG capsule Take 1 capsule (10 mg total) by mouth every 12 (twelve) hours as needed for spasms (abdominal pain). 12/10/20   Harvel Quale, MD  DULoxetine (CYMBALTA)  60 MG capsule Take 1 capsule (60 mg total) by mouth 2 (two) times daily. For mood control 02/23/18   Norman Clay, MD  glimepiride (AMARYL) 2 MG tablet Take 2 mg by mouth in the morning and at bedtime.    [provider]  letrozole (FEMARA) 2.5 MG tablet Take 1 tablet (2.5 mg total) by mouth daily. Stop the medication last week in February 2023 11/25/20   Magrinat, Virgie Dad, MD  lisinopril (PRINIVIL,ZESTRIL) 20 MG tablet Take 1 tablet (20 mg total) by mouth daily. For high blood pressure 06/08/17   Money, Lowry Ram, FNP  metFORMIN (GLUCOPHAGE) 500 MG tablet Take 1,000 mg by mouth 2 (two) times daily with a meal.     [provider]  omeprazole (PRILOSEC) 40 MG capsule Take 1 capsule (40 mg total) by mouth daily. 12/10/20   Harvel Quale, MD  rifaximin (XIFAXAN) 550 MG TABS tablet Take 1 tablet (550 mg total) by mouth 3 (three) times daily. 12/10/20   Harvel Quale, MD  traZODone ([place])  50 MG tablet Take 1 tablet (50 mg total) by mouth at bedtime as needed for sleep. 01/30/18   Norman Clay, MD    Allergies  Allergen Reactions   Sulfa Antibiotics Nausea And Vomiting    Social History   Socioeconomic History   Marital status: Divorced    Spouse name: Not on file   Number of children: 1   Years of education: Not on file   Highest education level: Not on file  Occupational History   Occupation: disabled/retired  Tobacco Use   Smoking status: Never   Smokeless tobacco: Never  Vaping Use   Vaping Use: Never used  Substance and Sexual Activity   Alcohol use: Yes    Comment: socially    Drug use: Not Currently    Comment: last used in 2018   Sexual activity: Not Currently    Birth control/protection: Post-menopausal  Other Topics Concern   Not on file  Social History Narrative   Lives Alone   Right Handed   Drinks 2 cups of caffeine daily   Social Determinants of Health   Financial Resource Strain: Not on file  Food Insecurity: Not on  file  Transportation Needs: Not on file  Physical Activity: Not on file  Stress: Not on file  Social Connections: Not on file  Intimate Partner Violence: Not on file    Tobacco Use: Low Risk  (12/21/2020)   Patient History    Smoking Tobacco Use: Never    Smokeless Tobacco Use: Never    Passive Exposure: Not on file   Social History   Substance and Sexual Activity  Alcohol Use Yes   Comment: socially     Family History  Problem Relation Age of Onset   Anxiety disorder Father    Depression Father    Other Father        Abestosis   Depression Sister    Diabetes Mother    Breast cancer Paternal Aunt     Review of Systems  Constitutional:  Negative for chills and fever.  HENT:  Negative for congestion, sore throat and tinnitus.   Eyes:  Negative for double vision, photophobia and pain.  Respiratory:  Negative for cough, shortness of breath and wheezing.   Cardiovascular:  Negative for chest pain, palpitations and orthopnea.  Gastrointestinal:  Negative for heartburn, nausea and vomiting.  Genitourinary:  Negative for dysuria, frequency and urgency.  Musculoskeletal:  Positive for joint pain.  Neurological:  Negative for dizziness, weakness and headaches.    Objective:  Physical Exam: Well nourished and well developed.  General: Alert and oriented x3, cooperative and pleasant, no acute distress.  Head: normocephalic, atraumatic, neck supple.  Eyes: EOMI.  Musculoskeletal:  Left Knee Exam: No effusion present. No swelling present. Slight varus deformity. The range of motion is: 10 to 110 degrees. Flexion contracture of 10 degrees. Moderate crepitus on range of motion of the knee. Positive medial joint line tenderness. Positive lateral joint line tenderness. The knee is stable.  Calves soft and nontender. Motor function intact in LE. Strength 5/5 LE bilaterally. Neuro: Distal pulses 2+. Sensation to light touch intact in LE.  Imaging Review Plain radiographs  demonstrate severe degenerative joint disease of the left knee. The overall alignment is mild varus. The bone quality appears to be adequate for age and reported activity level.  Assessment/Plan:  End stage arthritis, left knee   The patient history, physical examination, clinical judgment of the provider and imaging studies are consistent with end stage  degenerative joint disease of the left knee and total knee arthroplasty is deemed medically necessary. The treatment options including medical management, injection therapy arthroscopy and arthroplasty were discussed at length. The risks and benefits of total knee arthroplasty were presented and reviewed. The risks due to aseptic loosening, infection, stiffness, patella tracking problems, thromboembolic complications and other imponderables were discussed. The patient acknowledged the explanation, agreed to proceed with the plan and consent was signed. Patient is being admitted for inpatient treatment for surgery, pain control, PT, OT, prophylactic antibiotics, VTE prophylaxis, progressive ambulation and ADLs and discharge planning. The patient is planning to be discharged  home .   Patient's anticipated LOS is less than 2 midnights, meeting these requirements: - Younger than 74 - Lives within 1 hour of care - Has a competent adult at home to recover with post-op recover - NO history of  - Chronic pain requiring opioids  - Coronary Artery Disease  - Heart failure  - Heart attack  - Stroke  - DVT/VTE  - Cardiac arrhythmia  - Respiratory Failure/COPD  - Renal failure  - Anemia  - Advanced Liver disease  Therapy Plans: Outpatient therapy at [facility] Disposition: Home with daughter Planned DVT Prophylaxis: Xarelto 10 mg QD DME Needed: Gilford Rile PCP: Nicanor Bake, FNP (clearance received) TXA: IV Allergies: Sulfur (nausea) Anesthesia Concerns: None BMI: 39.9 Last HgbA1c: 7.0%  Pharmacy: [facility]  Other: -  NO BP STICKS OR IV LEFT ARM - DM II, was recently started on farxiga but has not taken due to side effects. 7.0% as of 09/14/21, going to recheck with preop labs.  - Patient was instructed on what medications to stop prior to surgery. - Follow-up visit in 2 weeks with Dr. Wynelle Link - Begin physical therapy following surgery - Pre-operative lab work as pre-surgical testing - Prescriptions will be provided in hospital at time of discharge  Theresa Duty, PA-C Orthopedic Surgery [facility] [place]

## 2021-11-10 IMAGING — MG DIGITAL DIAGNOSTIC BILAT W/ TOMO W/ CAD
6 of 9 series · 6 of 25 positions shown · non-contrast
Comparison: Previous exam(s).

CLINICAL DATA: History of LEFT breast cancer 1930 status post
lumpectomy and radiation therapy.

EXAM:
DIGITAL DIAGNOSTIC BILATERAL MAMMOGRAM WITH TOMOSYNTHESIS AND CAD
TECHNIQUE: Bilateral digital diagnostic mammography and breast tomosynthesis
was performed. The images were evaluated with computer-aided
detection.

[L MLO]
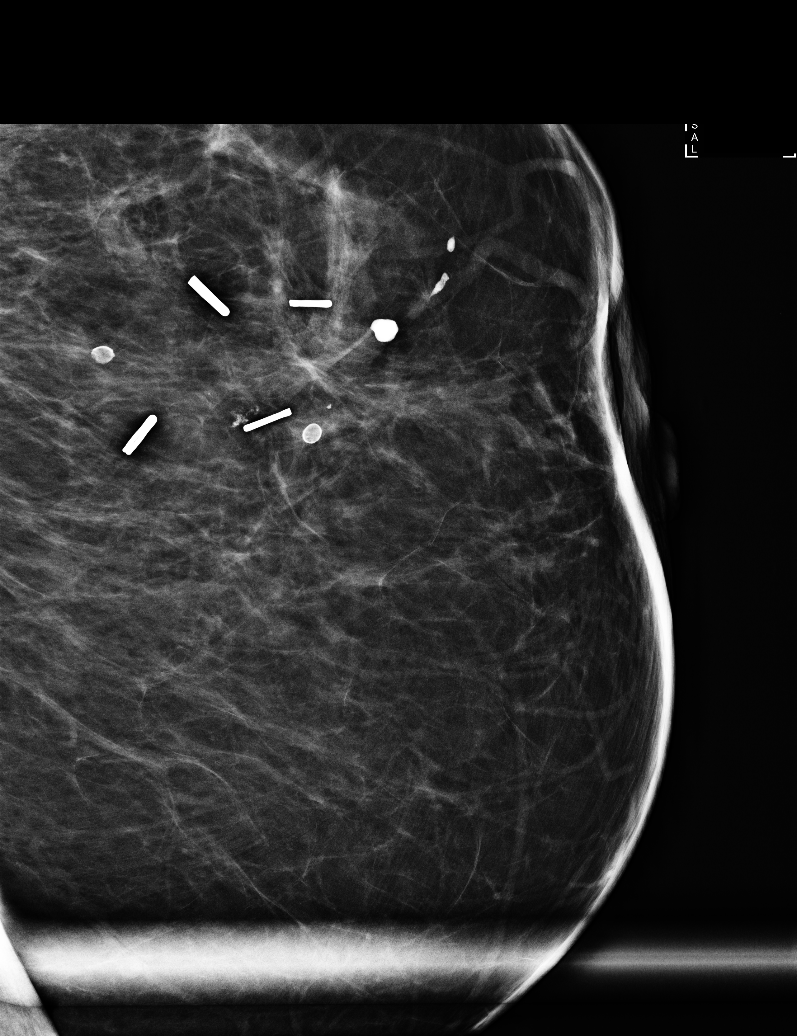

[L MLO synth-2D]
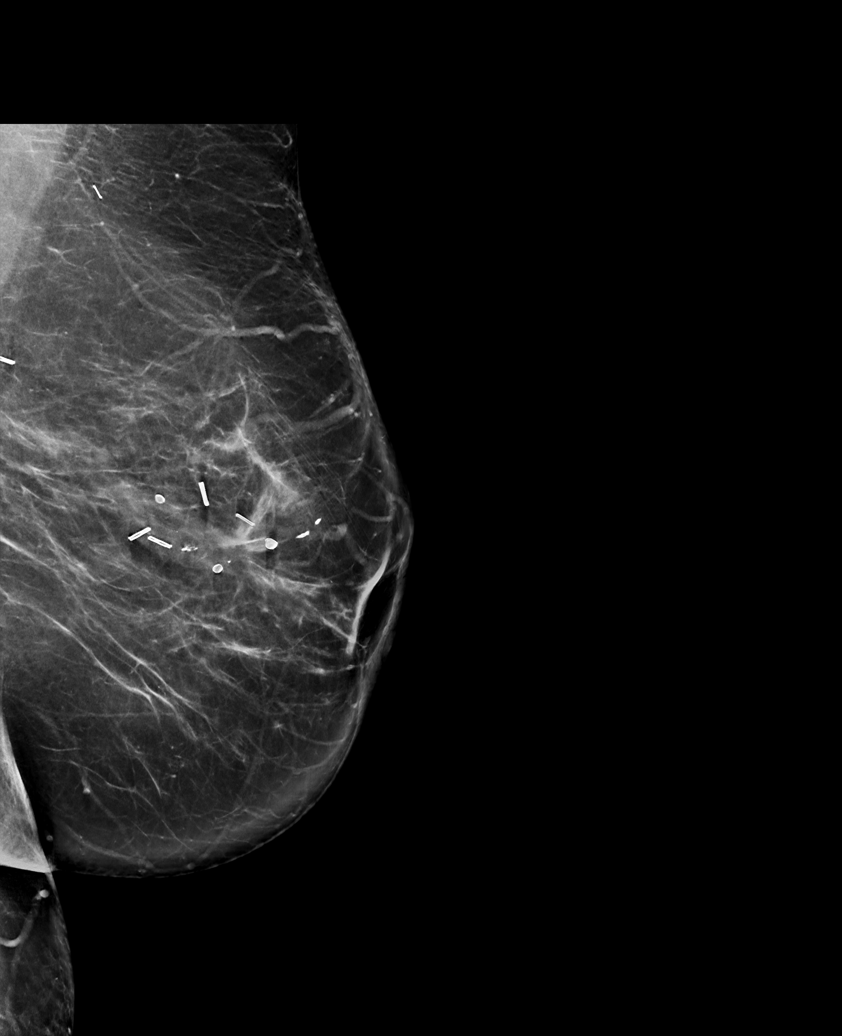

[L CC synth-2D]
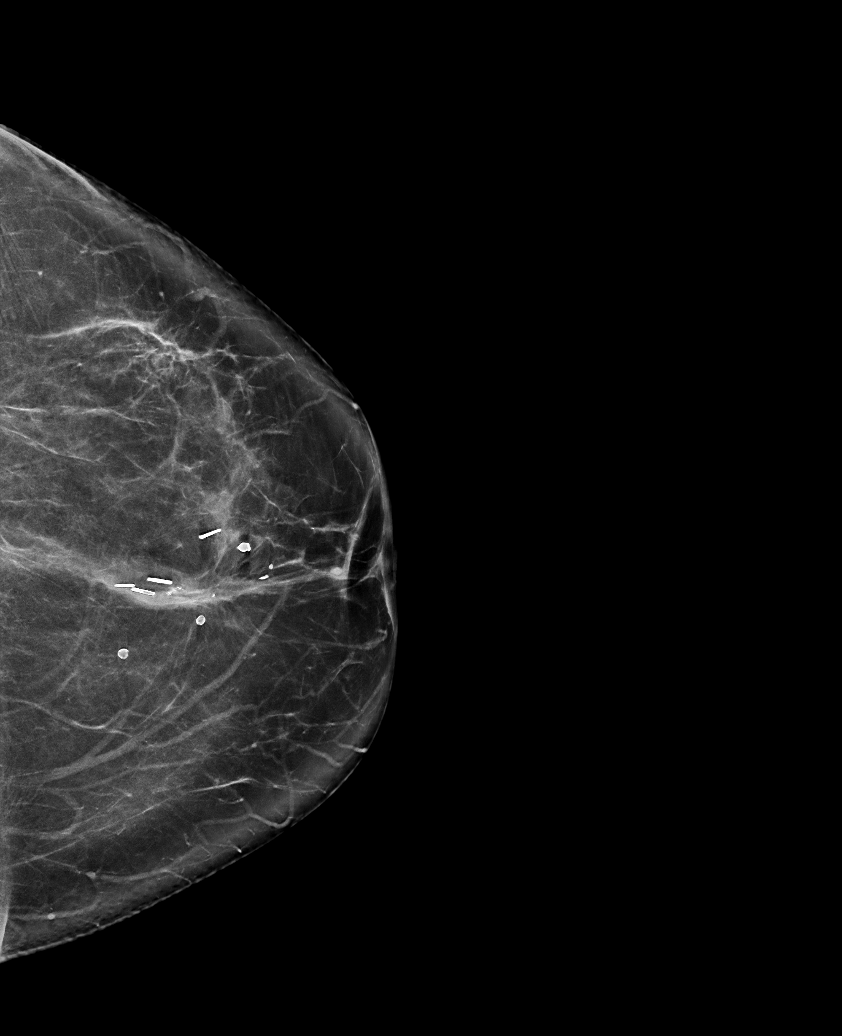

[R CC synth-2D]
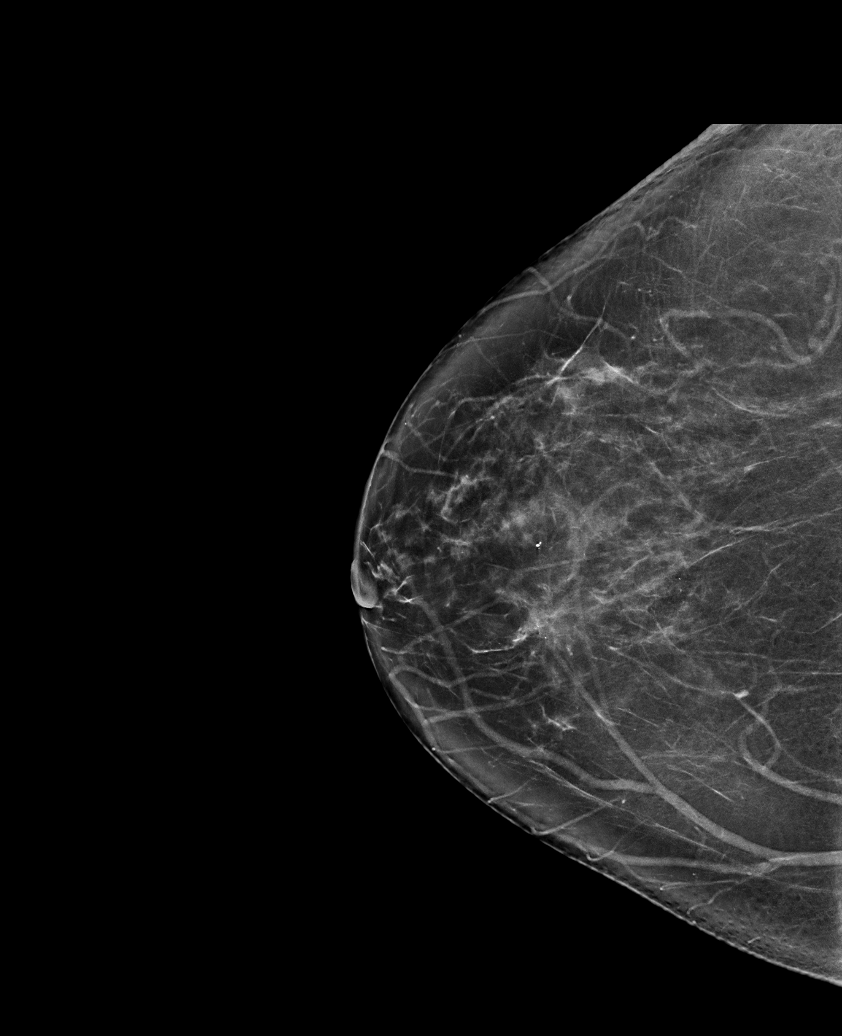

[R MLO synth-2D]
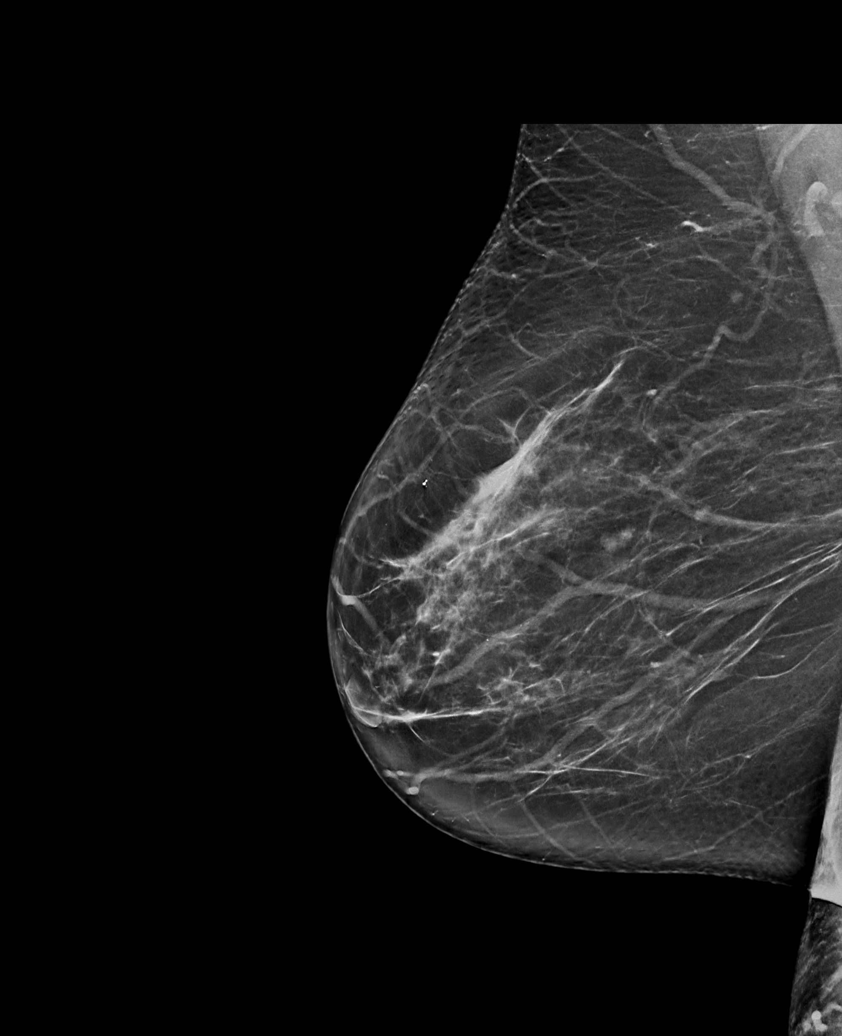

[L CC tomo · tomo slice 41/81.0]
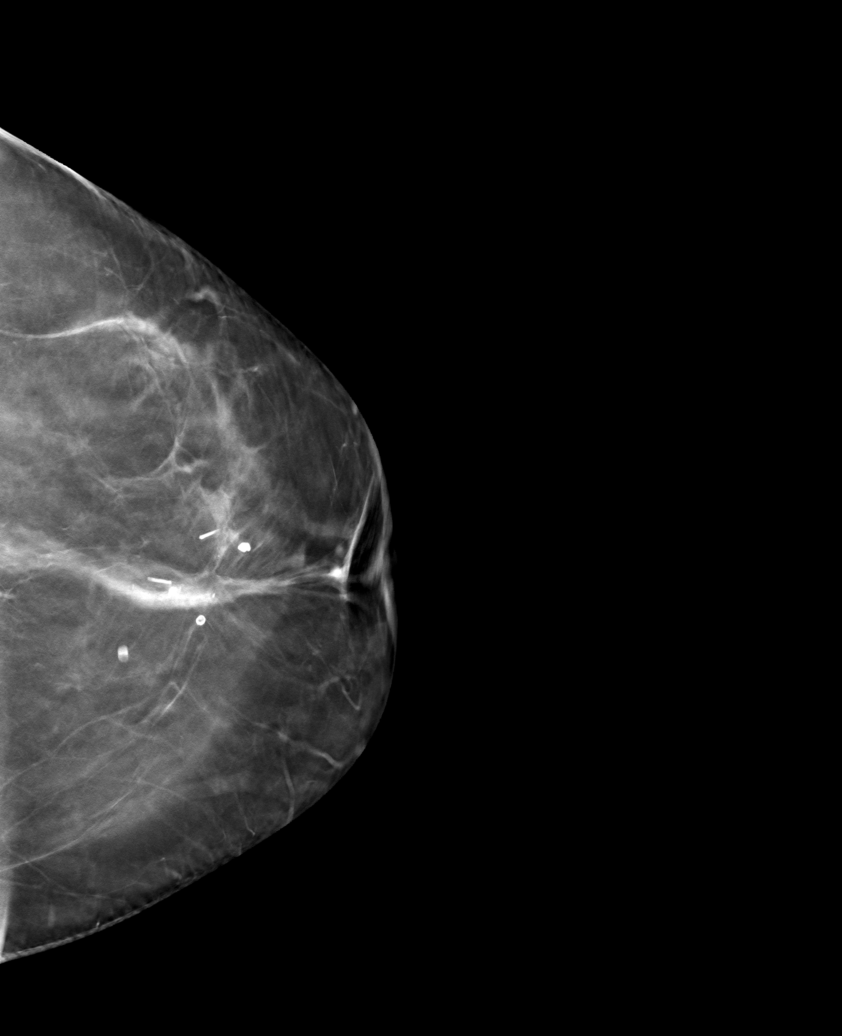

[6 of 25 positions shown; findings below may reference images not displayed]

ACR Breast Density Category b: There are scattered areas of
fibroglandular density.
FINDINGS: There are stable postsurgical changes within the LEFT breast. There
are no new dominant masses, suspicious calcifications or secondary
signs of malignancy within either breast.
IMPRESSION: No evidence of malignancy within either breast. Stable postsurgical
changes within the LEFT breast.

RECOMMENDATION:
1.  Screening mammogram in one year.(Code:[id number]-F-6M4)
2. Per protocol, as the patient is now 5 years status post
lumpectomy, she may return to annual screening mammography in 1
year. However, given the history of breast cancer, the patient
remains eligible for annual diagnostic mammography if preferred.

I have discussed the findings and recommendations with the patient.
If applicable, a reminder letter will be sent to the patient
regarding the next appointment.

BI-RADS CATEGORY  2: Benign.

## 2021-11-15 NOTE — Progress Notes (Addendum)
Anesthesia Review:  PCP: [name] clearnce 11/03/21 on chart  Cardiologist : none  Chest x-ray : EKG : 12/14/20  and 11/19/21  Echo : Stress test: Cardiac Cath :  Activity level: can do a flight of stairs without difficutly  Sleep Study/ CPAP : Has Bipap  Fasting Blood Sugar :      / Checks Blood Sugar -- times a day:   Blood Thinner/ Instructions /Last Dose: ASA / Instructions/ Last Dose :  81 mg aspirin  DM- type 2- checks glucose am and pm Hgba1c- 11/19/21- 7.7- routed to DR Aluisio on 11/17?23.  Ozempic - PT aware to hold for 7 days prior to surgery.  [name]- PT aware to hold for 72 hours prior to procedure.  PT states this med is for diabetes at preop.    PT reports at preop that she saw PCP yesterday on 11/18/2021 for tickle in throat.  PT states she was tested for covid and it was negative and MD stated allergies.   Called and requested most recent ov note.  OV of 11/18/21 on chart from [facility] , Nicanor Bake, FNP.

## 2021-11-15 NOTE — Patient Instructions (Signed)
SURGICAL WAITING ROOM VISITATION Patients having surgery or a procedure may have no more than 2 support people in the waiting area - these visitors may rotate.   Children under the age of 60 must have an adult with them who is not the patient. If the patient needs to stay at the hospital during part of their recovery, the visitor guidelines for inpatient rooms apply. Pre-op nurse will coordinate an appropriate time for 1 support person to accompany patient in pre-op.  This support person may not rotate.    Please refer to the [facility] website for the visitor guidelines for Inpatients (after your surgery is over and you are in a regular room).       Your procedure is scheduled on:  11/29/21    Report to [facility] Main Entrance    Report to admitting at  0545 AM   Call this number if you have problems the morning of surgery [phone]   Do not eat food :After Midnight.   After Midnight you may have the following liquids until  0515 AM  DAY OF SURGERY  Water Non-Citrus Juices (without pulp, NO RED) Carbonated Beverages Black Coffee (NO MILK/CREAM OR CREAMERS, sugar ok)  Clear Tea (NO MILK/CREAM OR CREAMERS, sugar ok) regular and decaf                             Plain Jell-O (NO RED)                                           Fruit ices (not with fruit pulp, NO RED)                                     Popsicles (NO RED)                                                               Sports drinks like Gatorade (NO RED)                    The day of surgery:  Drink ONE (1) Pre-Surgery Clear Ensure or G2 at  0515 AM ( have completed by )  the morning of surgery. Drink in one sitting. Do not sip.  This drink was given to you during your hospital  pre-op appointment visit. Nothing else to drink after completing the  Pre-Surgery Clear Ensure or G2.          If you have questions, please contact your surgeon's office.       Oral Hygiene is also important to reduce  your risk of infection.                                    Remember - BRUSH YOUR TEETH THE MORNING OF SURGERY WITH YOUR REGULAR TOOTHPASTE   Do NOT smoke after Midnight   Take these medicines the morning of surgery with A SIP OF WATER:   DO NOT TAKE ANY  ORAL DIABETIC MEDICATIONS DAY OF YOUR SURGERY  Bring CPAP mask and tubing day of surgery.                              You may not have any metal on your body including hair pins, jewelry, and body piercing             Do not wear make-up, lotions, powders, perfumes/cologne, or deodorant  Do not wear nail polish including gel and S&S, artificial/acrylic nails, or any other type of covering on natural nails including finger and toenails. If you have artificial nails, gel coating, etc. that needs to be removed by a nail salon please have this removed prior to surgery or surgery may need to be canceled/ delayed if the surgeon/ anesthesia feels like they are unable to be safely monitored.   Do not shave  48 hours prior to surgery.               Men may shave face and neck.   Do not bring valuables to the hospital. [place].   Contacts, dentures or bridgework may not be worn into surgery.   Bring small overnight bag day of surgery.   DO NOT [place]. PHARMACY WILL DISPENSE MEDICATIONS LISTED ON YOUR MEDICATION LIST TO YOU DURING YOUR ADMISSION [facility]!    Patients discharged on the day of surgery will not be allowed to drive home.  Someone NEEDS to stay with you for the first 24 hours after anesthesia.   Special Instructions: Bring a copy of your healthcare power of attorney and living will documents the day of surgery if you haven't scanned them before.              Please read over the following fact sheets you were given: IF Lindsay Pacheco [phone]   If you received a COVID test during your  pre-op visit  it is requested that you wear a mask when out in public, stay away from anyone that may not be feeling well and notify your surgeon if you develop symptoms. If you test positive for Covid or have been in contact with anyone that has tested positive in the last 10 days please notify you surgeon.    [facility] - Preparing for Surgery Before surgery, you can play an important role.  Because skin is not sterile, your skin needs to be as free of germs as possible.  You can reduce the number of germs on your skin by washing with CHG (chlorahexidine gluconate) soap before surgery.  CHG is an antiseptic cleaner which kills germs and bonds with the skin to continue killing germs even after washing. Please DO NOT use if you have an allergy to CHG or antibacterial soaps.  If your skin becomes reddened/irritated stop using the CHG and inform your nurse when you arrive at Short Stay. Do not shave (including legs and underarms) for at least 48 hours prior to the first CHG shower.  You may shave your face/neck. Please follow these instructions carefully:  1.  Shower with CHG Soap the night before surgery and the  morning of Surgery.  2.  If you choose to wash your hair, wash your hair first as usual with your  normal  shampoo.  3.  After you shampoo, rinse your hair and body thoroughly to remove the  shampoo.                           4.  Use CHG as you would any other liquid soap.  You can apply chg directly  to the skin and wash                       Gently with a scrungie or clean washcloth.  5.  Apply the CHG Soap to your body ONLY FROM THE NECK DOWN.   Do not use on face/ open                           Wound or open sores. Avoid contact with eyes, ears mouth and genitals (private parts).                       Wash face,  Genitals (private parts) with your normal soap.             6.  Wash thoroughly, paying special attention to the area where your surgery  will be performed.  7.  Thoroughly rinse  your body with warm water from the neck down.  8.  DO NOT shower/wash with your normal soap after using and rinsing off  the CHG Soap.                9.  Pat yourself dry with a clean towel.            10.  Wear clean pajamas.            11.  Place clean sheets on your bed the night of your first shower and do not  sleep with pets. Day of Surgery : Do not apply any lotions/deodorants the morning of surgery.  Please wear clean clothes to the hospital/surgery center.  FAILURE TO FOLLOW THESE INSTRUCTIONS MAY RESULT IN THE CANCELLATION OF YOUR SURGERY PATIENT SIGNATURE_________________________________  NURSE SIGNATURE__________________________________  ________________________________________________________________________

## 2021-11-18 DIAGNOSIS — J309 Allergic rhinitis, unspecified: Secondary | ICD-10-CM | POA: Diagnosis not present

## 2021-11-18 DIAGNOSIS — J029 Acute pharyngitis, unspecified: Secondary | ICD-10-CM | POA: Diagnosis not present

## 2021-11-18 DIAGNOSIS — G4733 Obstructive sleep apnea (adult) (pediatric): Secondary | ICD-10-CM | POA: Diagnosis not present

## 2021-11-18 DIAGNOSIS — E1165 Type 2 diabetes mellitus with hyperglycemia: Secondary | ICD-10-CM | POA: Diagnosis not present

## 2021-11-18 DIAGNOSIS — Z299 Encounter for prophylactic measures, unspecified: Secondary | ICD-10-CM | POA: Diagnosis not present

## 2021-11-18 DIAGNOSIS — I10 Essential (primary) hypertension: Secondary | ICD-10-CM | POA: Diagnosis not present

## 2021-11-18 DIAGNOSIS — Z789 Other specified health status: Secondary | ICD-10-CM | POA: Diagnosis not present

## 2021-11-19 ENCOUNTER — Other Ambulatory Visit: Payer: Self-pay | Admitting: Adult Health

## 2021-11-19 ENCOUNTER — Other Ambulatory Visit: Payer: Self-pay

## 2021-11-19 ENCOUNTER — Encounter (HOSPITAL_COMMUNITY)
Admission: RE | Admit: 2021-11-19 | Discharge: 2021-11-19 | Disposition: A | Discharge status: Home / Self Care | Payer: Medicare Other | Source: Ambulatory Visit | Attending: Orthopedic Surgery | Admitting: Orthopedic Surgery

## 2021-11-19 ENCOUNTER — Inpatient Hospital Stay (HOSPITAL_BASED_OUTPATIENT_CLINIC_OR_DEPARTMENT_OTHER): Payer: Medicare Other | Admitting: Adult Health

## 2021-11-19 ENCOUNTER — Inpatient Hospital Stay: Payer: Medicare Other | Attending: Adult Health

## 2021-11-19 ENCOUNTER — Encounter: Payer: Self-pay | Admitting: Adult Health

## 2021-11-19 ENCOUNTER — Encounter (HOSPITAL_COMMUNITY): Payer: Self-pay

## 2021-11-19 VITALS — BP 111/90 | HR 109 | Temp 97.0°F | Resp 16 | Wt 254.5 lb

## 2021-11-19 VITALS — BP 129/85 | HR 106 | Temp 98.4°F | Resp 16 | Ht 67.5 in | Wt 250.0 lb

## 2021-11-19 DIAGNOSIS — Z170 Estrogen receptor positive status [ER+]: Secondary | ICD-10-CM | POA: Diagnosis not present

## 2021-11-19 DIAGNOSIS — G4730 Sleep apnea, unspecified: Secondary | ICD-10-CM | POA: Diagnosis not present

## 2021-11-19 DIAGNOSIS — Z853 Personal history of malignant neoplasm of breast: Secondary | ICD-10-CM | POA: Insufficient documentation

## 2021-11-19 DIAGNOSIS — M1712 Unilateral primary osteoarthritis, left knee: Secondary | ICD-10-CM | POA: Diagnosis not present

## 2021-11-19 DIAGNOSIS — Z01818 Encounter for other preprocedural examination: Secondary | ICD-10-CM | POA: Diagnosis not present

## 2021-11-19 DIAGNOSIS — Z803 Family history of malignant neoplasm of breast: Secondary | ICD-10-CM | POA: Diagnosis not present

## 2021-11-19 DIAGNOSIS — I10 Essential (primary) hypertension: Secondary | ICD-10-CM | POA: Insufficient documentation

## 2021-11-19 DIAGNOSIS — C50212 Malignant neoplasm of upper-inner quadrant of left female breast: Secondary | ICD-10-CM | POA: Diagnosis not present

## 2021-11-19 DIAGNOSIS — E1136 Type 2 diabetes mellitus with diabetic cataract: Secondary | ICD-10-CM | POA: Diagnosis not present

## 2021-11-19 HISTORY — DX: Gastro-esophageal reflux disease without esophagitis: K21.9

## 2021-11-19 LAB — BASIC METABOLIC PANEL
Anion gap: 8 (ref 5–15)
BUN: 15 mg/dL (ref 8–23)
CO2: 27 mmol/L (ref 22–32)
Calcium: 9.5 mg/dL (ref 8.9–10.3)
Chloride: 100 mmol/L (ref 98–111)
Creatinine, Ser: 0.94 mg/dL (ref 0.44–1.00)
GFR, Estimated: >60 mL/min (ref >60)
Glucose, Bld: 198 mg/dL — ABNORMAL HIGH (ref 70–99)
Potassium: 4.3 mmol/L (ref 3.5–5.1)
Sodium: 135 mmol/L (ref 135–145)

## 2021-11-19 LAB — SURGICAL PCR SCREEN
MRSA, PCR: NEGATIVE
Staphylococcus aureus: POSITIVE — AB

## 2021-11-19 LAB — CBC
HCT: 44.1 % (ref 36.0–46.0)
Hemoglobin: 13.9 g/dL (ref 12.0–15.0)
MCH: 26.4 pg (ref 26.0–34.0)
MCHC: 31.5 g/dL (ref 30.0–36.0)
MCV: 83.7 fL (ref 80.0–100.0)
Platelets: 325 10*3/uL (ref 150–400)
RBC: 5.27 MIL/uL — ABNORMAL HIGH (ref 3.87–5.11)
RDW: 13.6 % (ref 11.5–15.5)
WBC: 9.8 10*3/uL (ref 4.0–10.5)
nRBC: 0 % (ref 0.0–0.2)

## 2021-11-19 LAB — HEPATIC FUNCTION PANEL
ALT: 19 U/L (ref 0–44)
AST: 21 U/L (ref 15–41)
Albumin: 4.1 g/dL (ref 3.5–5.0)
Alkaline Phosphatase: 147 U/L — ABNORMAL HIGH (ref 38–126)
Bilirubin, Direct: 0.2 mg/dL (ref 0.0–0.2)
Indirect Bilirubin: 0.4 mg/dL (ref 0.3–0.9)
Total Bilirubin: 0.6 mg/dL (ref 0.3–1.2)
Total Protein: 7.6 g/dL (ref 6.5–8.1)

## 2021-11-19 LAB — GLUCOSE, CAPILLARY: Glucose-Capillary: 195 mg/dL — ABNORMAL HIGH (ref 70–99)

## 2021-11-19 LAB — HEMOGLOBIN A1C
Hgb A1c MFr Bld: 7.7 % — ABNORMAL HIGH (ref 4.8–5.6)
Mean Plasma Glucose: 174.29 mg/dL

## 2021-11-19 NOTE — Progress Notes (Signed)
[facility] Follow up:    Lindsay Chroman, MD [address] [place] [address]   DIAGNOSIS: Cancer Staging  Malignant neoplasm of upper-inner quadrant of left breast in female, estrogen receptor positive ([place]) Staging form: Breast, AJCC 7th Edition - Clinical stage from 10/14/2015: Stage IIA (T2, N0, M0) - Signed by Kyung Rudd, MD on 10/14/2015 Laterality: Left Tumor size (mm): 22 Estrogen receptor status: Positive Progesterone receptor status: Positive HER2 status: Negative   SUMMARY OF ONCOLOGIC HISTORY: Oncology History  Malignant neoplasm of upper-inner quadrant of left breast in female, estrogen receptor positive ([place])  09/23/2015 Mammogram   Digital diagnostic mammogram and Ultrasound. Area of shadowing L breast on U/S at 12:00 1 cm from nipple corresponds to distortion on mammography. 3 adjacent masses in L breast at 11:00 could represent benign cysts, indeterminate. These are in the UI quadrant of L breast   09/30/2015 Initial Biopsy   Ultrasound guided biopsy at 12 o clock and 11 o clock position   09/30/2015 Pathology Results   12 o clock position invasive ductal carcinoma low grade, localized DCIS, cribriform type with calcifications. 11 o clock fibrotic and inflamed breast tissue DCIS  ER> 90%, PR > 90%, HER 2 IHC 2+. FISH negative   10/14/2015 Imaging   MRI breasts 2.2 cm irregular enhancing mass in the 12 o'clock region of the left breast corresponding with the biopsied low grade invasive ductal carcinoma and ductal carcinoma in-situ. 1.1 cm irregular enhancement in the 11 o'clock region of the left breast corresponding with the biopsied ductal carcinoma in-situ with fibrotic and inflamed breast tissue.   11/02/2015 Surgery   Procedure: 1. Left breast seed bracketed lumpectomy 2. Left axillary sentinel node biopsy Surgeon Dr Serita Grammes   11/02/2015 Pathology Results   Breast, lumpectomy, Left - INVASIVE DUCTAL CARCINOMA, GRADE I/III  SPANNING AT LEAST 2.2 CM. Negative sentinel nodes X 3   12/22/2015 - 02/11/2016 Radiation Therapy   Adjuvant breast radiation [facility]): The Left breast was treated to 50.4 Gy in 28 fractions at 1.8 Gy per fraction.  Left breast was boosted to 10 Gy in 5 fractions at 2 Gy per fraction.     CURRENT THERAPY:  INTERVAL HISTORY: Lindsay Pacheco 61 y.o. female returns for   Her most recent bone density testing occurred on 03/15/2021 and showed mild osteopenia with a t score of -2.1 in the left forearm.  She is scheduled for a diagnostic bilateral mammogram and ultrasound on 12/21/2021.   Patient Active Problem List   Diagnosis Date Noted  . IBS (irritable bowel syndrome) 12/10/2020  . Bloating 12/10/2020  . GERD (gastroesophageal reflux disease) 12/10/2020  . Chronic diarrhea 12/10/2020  . Genetic testing 02/11/2020  . Family history of breast cancer   . Family history of polyps in the colon   . OSA (obstructive sleep apnea) 02/25/2019  . Macular hole, left eye 07/03/2018  . Borderline personality disorder ([place]) 06/15/2017  . Moderate episode of recurrent major depressive disorder ([place]) 10/22/2015  . Malignant neoplasm of upper-inner quadrant of left breast in female, estrogen receptor positive ([place]) 10/14/2015  . Diabetes ([place]) 09/24/2015  . Hypertension     is allergic to sulfa antibiotics.  MEDICAL HISTORY: Past Medical History:  Diagnosis Date  . Anxiety   . Arthritis    "knees" (11/02/2015)  . Breast cancer ([place]) 2017   left breast   . Breast cancer, left breast ([place]) 09/30/2015  . Cataracts, bilateral    just watching right  now  . Depression   . Diabetes mellitus, type II ([place])   . Diverticulitis 1987  . Family history of breast cancer   . Family history of polyps in the colon   . GERD (gastroesophageal reflux disease)   . Hyperlipidemia   . Hypertension   . Irritable bowel syndrome (IBS)   . Personal history of radiation therapy   . Sleep apnea    does not use  CPAP but "I'm suppose to" (11/02/2015)  . SVD (spontaneous vaginal delivery)    x 1    SURGICAL HISTORY: Past Surgical History:  Procedure Laterality Date  . [place] VITRECTOMY WITH 20 GAUGE MVR PORT FOR MACULAR HOLE Left 07/03/2018   Procedure: 25 GAUGE PARS PLANA VITRECTOMY WITH 20 GAUGE MVR PORT FOR MACULAR HOLE;  Surgeon: [name], MD;  Location: [place];  Service: Ophthalmology;  Laterality: Left;  . BIOPSY  12/18/2019   Procedure: BIOPSY;  Surgeon: [name], MD;  [facility];  Service: Endoscopy;;  colon  . BIOPSY  12/16/2020   Procedure: BIOPSY;  Surgeon: Harvel Quale, MD;  [facility];  Service: Gastroenterology;;  . BREAST BIOPSY Left 09/2015  . BREAST LUMPECTOMY Left 11/02/2015  . BREAST LUMPECTOMY WITH AXILLARY LYMPH NODE BIOPSY Left 11/02/2015   BREAST LUMPECTOMY WITH BRACKETED RADIOACTIVE SEED AND LEFT AXILLARY SENTINEL LYMPH NODE BIOPSY   . BREAST LUMPECTOMY WITH RADIOACTIVE SEED AND SENTINEL LYMPH NODE BIOPSY Left 11/02/2015   Procedure: LEFT BREAST LUMPECTOMY WITH BRACKETED RADIOACTIVE SEED AND LEFT AXILLARY SENTINEL LYMPH NODE BIOPSY;  Surgeon: [name], MD;  Location: [place];  Service: General;  Laterality: Left;  . COLONOSCOPY N/A 12/09/2015   Procedure: COLONOSCOPY;  Surgeon: [name], MD;  [facility];  Service: Endoscopy;  Laterality: N/A;  1:00  . COLONOSCOPY WITH PROPOFOL N/A 12/18/2019   Procedure: COLONOSCOPY WITH PROPOFOL;  Surgeon: [name], MD;  [facility];  Service: Endoscopy;  Laterality: N/A;  1030  . ESOPHAGOGASTRODUODENOSCOPY (EGD) WITH PROPOFOL N/A 12/16/2020   Procedure: ESOPHAGOGASTRODUODENOSCOPY (EGD) WITH PROPOFOL;  Surgeon: Harvel Quale, MD;  [facility];  Service: Gastroenterology;  Laterality: N/A;  1:00  . EVACUATION BREAST HEMATOMA Left 11/02/2015  . EVACUATION BREAST HEMATOMA Left 11/02/2015    Procedure: EVACUATION HEMATOMA BREAST;  Surgeon: [name], MD;  Location: [place];  Service: General;  Laterality: Left;  [place] GAS/FLUID EXCHANGE Left 07/03/2018   Procedure: Gas/Fluid Exchange;  Surgeon: [name], MD;  Location: [place];  Service: Ophthalmology;  Laterality: Left;  . JOINT REPLACEMENT    . KNEE ARTHROSCOPY Right 2000s  . LAPAROSCOPIC CHOLECYSTECTOMY  1995  . PHOTOCOAGULATION WITH LASER Left 07/03/2018   Procedure: Photocoagulation With Laser;  Surgeon: [name], MD;  Location: [place];  Service: Ophthalmology;  Laterality: Left;  . POLYPECTOMY  12/09/2015   Procedure: POLYPECTOMY; colon polyp  Surgeon: [name], MD;  [facility];  Service: Endoscopy;;  multiple  . TOTAL KNEE ARTHROPLASTY Right 2011  . WISDOM TOOTH EXTRACTION      SOCIAL HISTORY: Social History   Socioeconomic History  . Marital status: Divorced    Spouse name: Not on file  . Number of children: 1  . Years of education: Not on file  . Highest education level: Not on file  Occupational History  . Occupation: disabled/retired  Tobacco Use  . Smoking status: Never  . Smokeless  tobacco: Never  Vaping Use  . Vaping Use: Never used  Substance and Sexual Activity  . Alcohol use: Yes    Comment: socially   . Drug use: Not Currently    Comment: last used marijuana 10 plus years ago  . Sexual activity: Not Currently    Birth control/protection: Post-menopausal  Other Topics Concern  . Not on file  Social History Narrative   Lives Alone   Right Handed   Drinks 2 cups of caffeine daily   Social Determinants of Health   Financial Resource Strain: Not on file  Food Insecurity: Not on file  Transportation Needs: Not on file  Physical Activity: Not on file  Stress: Not on file  Social Connections: Not on file  Intimate Partner Violence: Not on file    FAMILY HISTORY: Family History  Problem Relation Age of Onset  . Anxiety disorder Father    . Depression Father   . Other Father        Abestosis  . Depression Sister   . Diabetes Mother   . Breast cancer Paternal Aunt     Review of Systems - Oncology    PHYSICAL EXAMINATION  ECOG PERFORMANCE STATUS: {CHL ONC ECOG JA:[id number]}  There were no vitals filed for this visit.  Physical Exam  LABORATORY DATA:  CBC    Component Value Date/Time   WBC 9.8 11/19/2021 1011   RBC 5.27 (H) 11/19/2021 1011   HGB 13.9 11/19/2021 1011   HGB 12.6 11/25/2020 1318   HGB 13.3 10/13/2016 1222   HCT 44.1 11/19/2021 1011   HCT 40.4 10/13/2016 1222   PLT 325 11/19/2021 1011   PLT 330 11/25/2020 1318   PLT 288 10/13/2016 1222   MCV 83.7 11/19/2021 1011   MCV 83.6 10/13/2016 1222   MCH 26.4 11/19/2021 1011   MCHC 31.5 11/19/2021 1011   RDW 13.6 11/19/2021 1011   RDW 13.8 10/13/2016 1222   LYMPHSABS 2.6 11/25/2020 1318   LYMPHSABS 1.6 10/13/2016 1222   MONOABS 0.7 11/25/2020 1318   MONOABS 0.4 10/13/2016 1222   EOSABS 0.4 11/25/2020 1318   EOSABS 0.4 10/13/2016 1222   BASOSABS 0.1 11/25/2020 1318   BASOSABS 0.2 (H) 10/13/2016 1222    CMP     Component Value Date/Time   NA 135 11/19/2021 1011   NA 140 10/13/2016 1222   K 4.3 11/19/2021 1011   K 4.0 10/13/2016 1222   CL 100 11/19/2021 1011   CO2 27 11/19/2021 1011   CO2 27 10/13/2016 1222   GLUCOSE 198 (H) 11/19/2021 1011   GLUCOSE 234 (H) 10/13/2016 1222   BUN 15 11/19/2021 1011   BUN 12.5 10/13/2016 1222   CREATININE 0.94 11/19/2021 1011   CREATININE 0.89 11/25/2020 1318   CREATININE 0.9 10/13/2016 1222   CALCIUM 9.5 11/19/2021 1011   CALCIUM 10.0 10/13/2016 1222   PROT 7.6 11/25/2020 1318   PROT 7.5 10/13/2016 1222   ALBUMIN 3.8 11/25/2020 1318   ALBUMIN 3.7 10/13/2016 1222   AST 19 11/25/2020 1318   AST 17 10/13/2016 1222   ALT 17 11/25/2020 1318   ALT 15 10/13/2016 1222   ALKPHOS 139 (H) 11/25/2020 1318   ALKPHOS 139 10/13/2016 1222   BILITOT 0.6 11/25/2020 1318   BILITOT 0.67 10/13/2016 1222    GFRNONAA >60 11/19/2021 1011   GFRNONAA >60 11/25/2020 1318   GFRAA >60 11/22/2018 1116       PENDING LABS:   RADIOGRAPHIC STUDIES:  No results found.   PATHOLOGY:  ASSESSMENT and THERAPY PLAN:   No problem-specific Assessment & Plan notes found for this encounter.   No orders of the defined types were placed in this encounter.   All questions were answered. The patient knows to call the clinic with any problems, questions or concerns. We can certainly see the patient much sooner if necessary. This note was electronically signed. Scot Dock, NP 11/19/2021

## 2021-11-21 ENCOUNTER — Encounter (INDEPENDENT_AMBULATORY_CARE_PROVIDER_SITE_OTHER): Payer: Self-pay | Admitting: Gastroenterology

## 2021-11-21 ENCOUNTER — Encounter: Payer: Self-pay | Admitting: Adult Health

## 2021-11-21 NOTE — Assessment & Plan Note (Signed)
Lindsay Pacheco is a 61 year old woman with h/o stage IIA ER/PR positive breast cancer diagnosed in 09/2015 s/p lumpectomy, adjuvant radiation therapy, and antiestrogen therapy with Letrozole x 5 years completed in 02/2021.  Lindsay Pacheco has no clinical or radiographic signs of breast cancer recurrence.  She was recommended to proceed with her mammogram as scheduled on 12/21/2021.  We discussed healthy diet and slowly increasing her activity level as she recovers from her knee surgery.    Lindsay Pacheco will return in one year for continued surveillance and monitoring.

## 2021-11-22 ENCOUNTER — Inpatient Hospital Stay: Payer: Medicare Other | Admitting: Adult Health

## 2021-11-22 ENCOUNTER — Inpatient Hospital Stay: Payer: Medicare Other

## 2021-11-22 NOTE — Progress Notes (Signed)
Anesthesia Chart Review   Case: [id number] Date/Time: 11/29/21 0800   Procedure: TOTAL KNEE ARTHROPLASTY (Left: Knee)   Anesthesia type: Choice   Pre-op diagnosis: left knee osteoarthritis   Location: [facility] ROOM 10 / [facility]   Surgeons: Gaynelle Arabian, MD       DISCUSSION:61 y.o. never smoker with h/o HTN, DM II, sleep apnea, breast cancer 2017, left knee OA scheduled for above procedure 11/29/2021 with Dr. Gaynelle Arabian.   Clearance from PCP received.   Pt will hold Ozempic 7 days prior to surgery.   A1C 7.7, forwarded to surgeon.  VS: BP 129/85   Pulse (!) 106   Temp 36.9 C (Oral)   Resp 16   Ht 5' 7.5" (1.715 m)   Wt 113.4 kg   LMP  (LMP Unknown)   SpO2 96%   BMI 38.58 kg/m   PROVIDERS: Glenda Chroman, MD is PCP    LABS: Labs reviewed: Acceptable for surgery. (all labs ordered are listed, but only abnormal results are displayed)  Labs Reviewed  SURGICAL PCR SCREEN - Abnormal; Notable for the following components:      Result Value   Staphylococcus aureus POSITIVE (*)    All other components within normal limits  HEMOGLOBIN A1C - Abnormal; Notable for the following components:   Hgb A1c MFr Bld 7.7 (*)    All other components within normal limits  BASIC METABOLIC PANEL - Abnormal; Notable for the following components:   Glucose, Bld 198 (*)    All other components within normal limits  CBC - Abnormal; Notable for the following components:   RBC 5.27 (*)    All other components within normal limits  GLUCOSE, CAPILLARY - Abnormal; Notable for the following components:   Glucose-Capillary 195 (*)    All other components within normal limits     IMAGES:   EKG:   CV:  Past Medical History:  Diagnosis Date   Anxiety    Arthritis    "knees" (11/02/2015)   Breast cancer ([place]) 2017   left breast    Breast cancer, left breast ([place]) 09/30/2015   Cataracts, bilateral    just watching right now   Depression    Diabetes mellitus, type II ([place])     Diverticulitis 1987   Family history of breast cancer    Family history of polyps in the colon    GERD (gastroesophageal reflux disease)    Hyperlipidemia    Hypertension    Irritable bowel syndrome (IBS)    Personal history of radiation therapy    Sleep apnea    does not use CPAP but "I'm suppose to" (11/02/2015)   SVD (spontaneous vaginal delivery)    x 1    Past Surgical History:  Procedure Laterality Date   25 GAUGE PARS PLANA VITRECTOMY WITH 20 GAUGE MVR PORT FOR MACULAR HOLE Left 07/03/2018   Procedure: 25 GAUGE PARS PLANA VITRECTOMY WITH 20 GAUGE MVR PORT FOR MACULAR HOLE;  Surgeon: [name], MD;  Location: [place];  Service: Ophthalmology;  Laterality: Left;   BIOPSY  12/18/2019   Procedure: BIOPSY;  Surgeon: [name], MD;  [facility];  Service: Endoscopy;;  colon   BIOPSY  12/16/2020   Procedure: BIOPSY;  Surgeon: Harvel Quale, MD;  [facility];  Service: Gastroenterology;;   BREAST BIOPSY Left 09/2015   BREAST LUMPECTOMY Left 11/02/2015   BREAST LUMPECTOMY WITH AXILLARY LYMPH NODE BIOPSY Left 11/02/2015   BREAST LUMPECTOMY WITH BRACKETED  RADIOACTIVE SEED AND LEFT AXILLARY SENTINEL LYMPH NODE BIOPSY    BREAST LUMPECTOMY WITH RADIOACTIVE SEED AND SENTINEL LYMPH NODE BIOPSY Left 11/02/2015   Procedure: LEFT BREAST LUMPECTOMY WITH BRACKETED RADIOACTIVE SEED AND LEFT AXILLARY SENTINEL LYMPH NODE BIOPSY;  Surgeon: [name], MD;  Location: [place];  Service: General;  Laterality: Left;   COLONOSCOPY N/A 12/09/2015   Procedure: COLONOSCOPY;  Surgeon: [name], MD;  [facility];  Service: Endoscopy;  Laterality: N/A;  1:00   COLONOSCOPY WITH PROPOFOL N/A 12/18/2019   Procedure: COLONOSCOPY WITH PROPOFOL;  Surgeon: [name], MD;  [facility];  Service: Endoscopy;  Laterality: N/A;  1030   ESOPHAGOGASTRODUODENOSCOPY (EGD) WITH PROPOFOL N/A 12/16/2020   Procedure:  ESOPHAGOGASTRODUODENOSCOPY (EGD) WITH PROPOFOL;  Surgeon: Harvel Quale, MD;  [facility];  Service: Gastroenterology;  Laterality: N/A;  1:00   EVACUATION BREAST HEMATOMA Left 11/02/2015   EVACUATION BREAST HEMATOMA Left 11/02/2015   Procedure: EVACUATION HEMATOMA BREAST;  Surgeon: [name], MD;  Location: [place];  Service: General;  Laterality: Left;   GAS/FLUID EXCHANGE Left 07/03/2018   Procedure: Gas/Fluid Exchange;  Surgeon: [name], MD;  Location: [place];  Service: Ophthalmology;  Laterality: Left;   JOINT REPLACEMENT     KNEE ARTHROSCOPY Right 2000s   LAPAROSCOPIC CHOLECYSTECTOMY  1995   PHOTOCOAGULATION WITH LASER Left 07/03/2018   Procedure: Photocoagulation With Laser;  Surgeon: [name], MD;  Location: [place];  Service: Ophthalmology;  Laterality: Left;   POLYPECTOMY  12/09/2015   Procedure: POLYPECTOMY; colon polyp  Surgeon: [name], MD;  [facility];  Service: Endoscopy;;  multiple   TOTAL KNEE ARTHROPLASTY Right 2011   WISDOM TOOTH EXTRACTION      MEDICATIONS:  loratadine (CLARITIN) 10 MG tablet   Semaglutide,0.25 or 0.'5MG'$ /DOS, (OZEMPIC, 0.25 OR 0.5 MG/DOSE,) 2 MG/1.5ML SOPN   acetaminophen (TYLENOL) 500 MG tablet   aspirin 81 MG tablet   atorvastatin (LIPITOR) 10 MG tablet   bismuth subsalicylate (PEPTO BISMOL) 262 MG/15ML suspension   buPROPion (WELLBUTRIN XL) 150 MG 24 hr tablet   dicyclomine (BENTYL) 10 MG capsule   DULoxetine (CYMBALTA) 60 MG capsule   FARXIGA 10 MG TABS tablet   glimepiride (AMARYL) 2 MG tablet   lisinopril (PRINIVIL,ZESTRIL) 20 MG tablet   metFORMIN (GLUCOPHAGE) 500 MG tablet   NON FORMULARY   omeprazole (PRILOSEC) 40 MG capsule   Polyethyl Glycol-Propyl Glycol (SYSTANE OP)   traZODone (DESYREL) 50 MG tablet   No current facility-administered medications for this encounter.    Konrad Felix Ward, PA-C [facility] [phone]

## 2021-11-23 ENCOUNTER — Telehealth: Payer: Self-pay | Admitting: Adult Health

## 2021-11-23 NOTE — Telephone Encounter (Signed)
Scheduled appointment per 11/17 los. Patient is aware.

## 2021-11-29 ENCOUNTER — Ambulatory Visit (HOSPITAL_COMMUNITY): Payer: Medicare Other | Admitting: Physician Assistant

## 2021-11-29 ENCOUNTER — Encounter (HOSPITAL_COMMUNITY)
Admission: RE | Disposition: A | Discharge status: Home / Self Care | Payer: Self-pay | Source: Home / Self Care | Attending: Orthopedic Surgery

## 2021-11-29 ENCOUNTER — Ambulatory Visit (HOSPITAL_BASED_OUTPATIENT_CLINIC_OR_DEPARTMENT_OTHER): Payer: Medicare Other | Admitting: Certified Registered Nurse Anesthetist

## 2021-11-29 ENCOUNTER — Other Ambulatory Visit: Payer: Self-pay

## 2021-11-29 ENCOUNTER — Observation Stay (HOSPITAL_COMMUNITY)
Admission: RE | Admit: 2021-11-29 | Discharge: 2021-11-30 | Disposition: A | Discharge status: Home / Self Care | Payer: Medicare Other | Attending: Orthopedic Surgery | Admitting: Orthopedic Surgery

## 2021-11-29 ENCOUNTER — Encounter (HOSPITAL_COMMUNITY): Payer: Self-pay | Admitting: Orthopedic Surgery

## 2021-11-29 DIAGNOSIS — I10 Essential (primary) hypertension: Secondary | ICD-10-CM | POA: Diagnosis not present

## 2021-11-29 DIAGNOSIS — M179 Osteoarthritis of knee, unspecified: Secondary | ICD-10-CM | POA: Diagnosis present

## 2021-11-29 DIAGNOSIS — Z7982 Long term (current) use of aspirin: Secondary | ICD-10-CM | POA: Diagnosis not present

## 2021-11-29 DIAGNOSIS — E119 Type 2 diabetes mellitus without complications: Secondary | ICD-10-CM | POA: Diagnosis not present

## 2021-11-29 DIAGNOSIS — Z01818 Encounter for other preprocedural examination: Secondary | ICD-10-CM

## 2021-11-29 DIAGNOSIS — M1712 Unilateral primary osteoarthritis, left knee: Secondary | ICD-10-CM

## 2021-11-29 DIAGNOSIS — Z7984 Long term (current) use of oral hypoglycemic drugs: Secondary | ICD-10-CM | POA: Insufficient documentation

## 2021-11-29 DIAGNOSIS — Z96651 Presence of right artificial knee joint: Secondary | ICD-10-CM | POA: Insufficient documentation

## 2021-11-29 DIAGNOSIS — G8918 Other acute postprocedural pain: Secondary | ICD-10-CM | POA: Diagnosis not present

## 2021-11-29 DIAGNOSIS — Z853 Personal history of malignant neoplasm of breast: Secondary | ICD-10-CM | POA: Diagnosis not present

## 2021-11-29 DIAGNOSIS — Z79899 Other long term (current) drug therapy: Secondary | ICD-10-CM | POA: Diagnosis not present

## 2021-11-29 HISTORY — PX: TOTAL KNEE ARTHROPLASTY: SHX125

## 2021-11-29 LAB — GLUCOSE, CAPILLARY
Glucose-Capillary: 132 mg/dL — ABNORMAL HIGH (ref 70–99)
Glucose-Capillary: 123 mg/dL — ABNORMAL HIGH (ref 70–99)
Glucose-Capillary: 143 mg/dL — ABNORMAL HIGH (ref 70–99)
Glucose-Capillary: 173 mg/dL — ABNORMAL HIGH (ref 70–99)
Glucose-Capillary: 162 mg/dL — ABNORMAL HIGH (ref 70–99)

## 2021-11-29 SURGERY — ARTHROPLASTY, KNEE, TOTAL
Anesthesia: Monitor Anesthesia Care | Site: Knee | Laterality: Left

## 2021-11-29 MED ORDER — CLONIDINE HCL (ANALGESIA) 100 MCG/ML EP SOLN
EPIDURAL | Status: DC | PRN
  Administered 2021-11-29: 50 ug

## 2021-11-29 MED ORDER — DULOXETINE HCL 60 MG PO CPEP
60.0000 mg | ORAL_CAPSULE | Freq: Two times a day (BID) | ORAL | Status: DC
  Administered 2021-11-30: 60 mg via ORAL
  Filled 2021-11-29 (×2): qty 1

## 2021-11-29 MED ORDER — PROPOFOL 10 MG/ML IV BOLUS
INTRAVENOUS | Status: AC
  Filled 2021-11-29: qty 20

## 2021-11-29 MED ORDER — SODIUM CHLORIDE 0.9 % IV SOLN
INTRAVENOUS | Status: DC | PRN
  Administered 2021-11-29: 80 mL

## 2021-11-29 MED ORDER — POLYETHYLENE GLYCOL 3350 17 G PO PACK: 17.0000 g | PACK | Freq: Every day | ORAL | Status: DC | PRN

## 2021-11-29 MED ORDER — OXYCODONE HCL 5 MG PO TABS
5.0000 mg | ORAL_TABLET | ORAL | Status: DC | PRN
  Filled 2021-11-29: qty 2

## 2021-11-29 MED ORDER — SODIUM CHLORIDE 0.9 % IR SOLN
Status: DC | PRN
  Administered 2021-11-29 (×2): 1000 mL

## 2021-11-29 MED ORDER — DEXAMETHASONE SODIUM PHOSPHATE 10 MG/ML IJ SOLN
INTRAMUSCULAR | Status: AC
  Filled 2021-11-29: qty 1

## 2021-11-29 MED ORDER — CEFAZOLIN SODIUM-DEXTROSE 2-4 GM/100ML-% IV SOLN
2.0000 g | INTRAVENOUS | Status: AC
  Administered 2021-11-29: 2 g via INTRAVENOUS
  Filled 2021-11-29: qty 100

## 2021-11-29 MED ORDER — LORATADINE 10 MG PO TABS
10.0000 mg | ORAL_TABLET | Freq: Every day | ORAL | Status: DC
  Administered 2021-11-30: 10 mg via ORAL
  Filled 2021-11-29: qty 1

## 2021-11-29 MED ORDER — MENTHOL 3 MG MT LOZG: 1.0000 | LOZENGE | OROMUCOSAL | Status: DC | PRN

## 2021-11-29 MED ORDER — GLIMEPIRIDE 2 MG PO TABS
2.0000 mg | ORAL_TABLET | Freq: Every day | ORAL | Status: DC
  Administered 2021-11-30: 2 mg via ORAL
  Filled 2021-11-29: qty 1

## 2021-11-29 MED ORDER — SODIUM CHLORIDE (PF) 0.9 % IJ SOLN
INTRAMUSCULAR | Status: AC
  Filled 2021-11-29: qty 50

## 2021-11-29 MED ORDER — DICYCLOMINE HCL 10 MG PO CAPS: 10.0000 mg | ORAL_CAPSULE | Freq: Two times a day (BID) | ORAL | Status: DC | PRN

## 2021-11-29 MED ORDER — DIPHENHYDRAMINE HCL 12.5 MG/5ML PO ELIX: 12.5000 mg | ORAL_SOLUTION | ORAL | Status: DC | PRN

## 2021-11-29 MED ORDER — SODIUM CHLORIDE 0.9 % IV SOLN: INTRAVENOUS | Status: DC

## 2021-11-29 MED ORDER — BUPROPION HCL ER (XL) 150 MG PO TB24
150.0000 mg | ORAL_TABLET | Freq: Every day | ORAL | Status: DC
  Administered 2021-11-30: 150 mg via ORAL
  Filled 2021-11-29: qty 1

## 2021-11-29 MED ORDER — FENTANYL CITRATE PF 50 MCG/ML IJ SOSY: 25.0000 ug | PREFILLED_SYRINGE | INTRAMUSCULAR | Status: DC | PRN

## 2021-11-29 MED ORDER — INSULIN ASPART 100 UNIT/ML IJ SOLN: 0.0000 [IU] | Freq: Every day | INTRAMUSCULAR | Status: DC

## 2021-11-29 MED ORDER — METHOCARBAMOL 500 MG PO TABS
500.0000 mg | ORAL_TABLET | Freq: Four times a day (QID) | ORAL | Status: DC | PRN
  Administered 2021-11-29 – 2021-11-30 (×3): 500 mg via ORAL
  Filled 2021-11-29 (×4): qty 1

## 2021-11-29 MED ORDER — PHENOL 1.4 % MT LIQD: 1.0000 | OROMUCOSAL | Status: DC | PRN

## 2021-11-29 MED ORDER — PROPOFOL 1000 MG/100ML IV EMUL
INTRAVENOUS | Status: AC
  Filled 2021-11-29: qty 100

## 2021-11-29 MED ORDER — BUPIVACAINE LIPOSOME 1.3 % IJ SUSP
INTRAMUSCULAR | Status: AC
  Filled 2021-11-29: qty 20

## 2021-11-29 MED ORDER — CEFAZOLIN SODIUM-DEXTROSE 2-4 GM/100ML-% IV SOLN
2.0000 g | Freq: Four times a day (QID) | INTRAVENOUS | Status: AC
  Administered 2021-11-29 (×2): 2 g via INTRAVENOUS
  Filled 2021-11-29 (×2): qty 100

## 2021-11-29 MED ORDER — PROPOFOL 500 MG/50ML IV EMUL
INTRAVENOUS | Status: AC
  Filled 2021-11-29: qty 50

## 2021-11-29 MED ORDER — BISACODYL 10 MG RE SUPP: 10.0000 mg | Freq: Every day | RECTAL | Status: DC | PRN

## 2021-11-29 MED ORDER — RIVAROXABAN 10 MG PO TABS
10.0000 mg | ORAL_TABLET | Freq: Every day | ORAL | Status: DC
  Administered 2021-11-30: 10 mg via ORAL
  Filled 2021-11-29: qty 1

## 2021-11-29 MED ORDER — SODIUM CHLORIDE (PF) 0.9 % IJ SOLN
INTRAMUSCULAR | Status: AC
  Filled 2021-11-29: qty 10

## 2021-11-29 MED ORDER — ONDANSETRON HCL 4 MG/2ML IJ SOLN
INTRAMUSCULAR | Status: AC
  Filled 2021-11-29: qty 2

## 2021-11-29 MED ORDER — BUPIVACAINE IN DEXTROSE 0.75-8.25 % IT SOLN
INTRATHECAL | Status: DC | PRN
  Administered 2021-11-29: 1.6 mL via INTRATHECAL

## 2021-11-29 MED ORDER — TRANEXAMIC ACID-NACL 1000-0.7 MG/100ML-% IV SOLN
1000.0000 mg | INTRAVENOUS | Status: AC
  Administered 2021-11-29: 1000 mg via INTRAVENOUS
  Filled 2021-11-29: qty 100

## 2021-11-29 MED ORDER — ONDANSETRON HCL 4 MG/2ML IJ SOLN: 4.0000 mg | Freq: Four times a day (QID) | INTRAMUSCULAR | Status: DC | PRN

## 2021-11-29 MED ORDER — TRAZODONE HCL 50 MG PO TABS
50.0000 mg | ORAL_TABLET | Freq: Every evening | ORAL | Status: DC | PRN
  Administered 2021-11-29: 50 mg via ORAL
  Filled 2021-11-29: qty 1

## 2021-11-29 MED ORDER — DOCUSATE SODIUM 100 MG PO CAPS
100.0000 mg | ORAL_CAPSULE | Freq: Two times a day (BID) | ORAL | Status: DC
  Administered 2021-11-29 – 2021-11-30 (×2): 100 mg via ORAL
  Filled 2021-11-29 (×2): qty 1

## 2021-11-29 MED ORDER — INSULIN ASPART 100 UNIT/ML IJ SOLN
0.0000 [IU] | Freq: Three times a day (TID) | INTRAMUSCULAR | Status: DC
  Administered 2021-11-29: 3 [IU] via SUBCUTANEOUS
  Administered 2021-11-30: 2 [IU] via SUBCUTANEOUS

## 2021-11-29 MED ORDER — OXYCODONE HCL 5 MG PO TABS
10.0000 mg | ORAL_TABLET | ORAL | Status: DC | PRN
  Administered 2021-11-29: 10 mg via ORAL
  Administered 2021-11-29: 15 mg via ORAL
  Administered 2021-11-29: 10 mg via ORAL
  Administered 2021-11-30 (×2): 15 mg via ORAL
  Administered 2021-11-30: 10 mg via ORAL
  Filled 2021-11-29: qty 3
  Filled 2021-11-29: qty 2
  Filled 2021-11-29: qty 3
  Filled 2021-11-29 (×2): qty 2
  Filled 2021-11-29 (×2): qty 3

## 2021-11-29 MED ORDER — BUPIVACAINE LIPOSOME 1.3 % IJ SUSP: 20.0000 mL | Freq: Once | INTRAMUSCULAR | Status: DC

## 2021-11-29 MED ORDER — METOCLOPRAMIDE HCL 5 MG/ML IJ SOLN: 5.0000 mg | Freq: Three times a day (TID) | INTRAMUSCULAR | Status: DC | PRN

## 2021-11-29 MED ORDER — ACETAMINOPHEN 10 MG/ML IV SOLN
1000.0000 mg | Freq: Four times a day (QID) | INTRAVENOUS | Status: DC
  Administered 2021-11-29: 1000 mg via INTRAVENOUS
  Filled 2021-11-29: qty 100

## 2021-11-29 MED ORDER — AMISULPRIDE (ANTIEMETIC) 5 MG/2ML IV SOLN: 10.0000 mg | Freq: Once | INTRAVENOUS | Status: DC | PRN

## 2021-11-29 MED ORDER — CHLORHEXIDINE GLUCONATE 0.12 % MT SOLN
15.0000 mL | Freq: Once | OROMUCOSAL | Status: AC
  Administered 2021-11-29: 15 mL via OROMUCOSAL

## 2021-11-29 MED ORDER — FLEET ENEMA 7-19 GM/118ML RE ENEM: 1.0000 | ENEMA | Freq: Once | RECTAL | Status: DC | PRN

## 2021-11-29 MED ORDER — LACTATED RINGERS IV SOLN: INTRAVENOUS | Status: DC

## 2021-11-29 MED ORDER — PROPOFOL 10 MG/ML IV BOLUS
INTRAVENOUS | Status: DC | PRN
  Administered 2021-11-29 (×5): 20 mg via INTRAVENOUS

## 2021-11-29 MED ORDER — DEXAMETHASONE SODIUM PHOSPHATE 10 MG/ML IJ SOLN
8.0000 mg | Freq: Once | INTRAMUSCULAR | Status: AC
  Administered 2021-11-29: 8 mg via INTRAVENOUS

## 2021-11-29 MED ORDER — FENTANYL CITRATE PF 50 MCG/ML IJ SOSY
50.0000 ug | PREFILLED_SYRINGE | INTRAMUSCULAR | Status: DC
  Administered 2021-11-29: 50 ug via INTRAVENOUS
  Filled 2021-11-29: qty 2

## 2021-11-29 MED ORDER — MORPHINE SULFATE (PF) 2 MG/ML IV SOLN
1.0000 mg | INTRAVENOUS | Status: DC | PRN
  Administered 2021-11-29: 2 mg via INTRAVENOUS
  Filled 2021-11-29: qty 1

## 2021-11-29 MED ORDER — METOCLOPRAMIDE HCL 5 MG PO TABS: 5.0000 mg | ORAL_TABLET | Freq: Three times a day (TID) | ORAL | Status: DC | PRN

## 2021-11-29 MED ORDER — ACETAMINOPHEN 500 MG PO TABS
1000.0000 mg | ORAL_TABLET | Freq: Four times a day (QID) | ORAL | Status: DC
  Administered 2021-11-29 – 2021-11-30 (×3): 1000 mg via ORAL
  Filled 2021-11-29 (×3): qty 2

## 2021-11-29 MED ORDER — ONDANSETRON HCL 4 MG/2ML IJ SOLN
INTRAMUSCULAR | Status: DC | PRN
  Administered 2021-11-29: 4 mg via INTRAVENOUS

## 2021-11-29 MED ORDER — GABAPENTIN 300 MG PO CAPS
300.0000 mg | ORAL_CAPSULE | Freq: Three times a day (TID) | ORAL | Status: DC
  Administered 2021-11-29 – 2021-11-30 (×4): 300 mg via ORAL
  Filled 2021-11-29 (×4): qty 1

## 2021-11-29 MED ORDER — ATORVASTATIN CALCIUM 10 MG PO TABS
10.0000 mg | ORAL_TABLET | Freq: Every day | ORAL | Status: DC
  Administered 2021-11-30: 10 mg via ORAL
  Filled 2021-11-29: qty 1

## 2021-11-29 MED ORDER — ONDANSETRON HCL 4 MG PO TABS: 4.0000 mg | ORAL_TABLET | Freq: Four times a day (QID) | ORAL | Status: DC | PRN

## 2021-11-29 MED ORDER — DAPAGLIFLOZIN PROPANEDIOL 10 MG PO TABS
10.0000 mg | ORAL_TABLET | Freq: Every day | ORAL | Status: DC
  Administered 2021-11-30: 10 mg via ORAL
  Filled 2021-11-29: qty 1

## 2021-11-29 MED ORDER — MIDAZOLAM HCL 2 MG/2ML IJ SOLN
1.0000 mg | INTRAMUSCULAR | Status: DC
  Administered 2021-11-29: 2 mg via INTRAVENOUS
  Filled 2021-11-29: qty 2

## 2021-11-29 MED ORDER — ROPIVACAINE HCL 5 MG/ML IJ SOLN
INTRAMUSCULAR | Status: DC | PRN
  Administered 2021-11-29: 20 mL via PERINEURAL

## 2021-11-29 MED ORDER — METHOCARBAMOL 1000 MG/10ML IJ SOLN: 500.0000 mg | Freq: Four times a day (QID) | INTRAVENOUS | Status: DC | PRN

## 2021-11-29 MED ORDER — PANTOPRAZOLE SODIUM 40 MG PO TBEC
80.0000 mg | DELAYED_RELEASE_TABLET | Freq: Every day | ORAL | Status: DC
  Administered 2021-11-30: 80 mg via ORAL
  Filled 2021-11-29: qty 2

## 2021-11-29 MED ORDER — ORAL CARE MOUTH RINSE: 15.0000 mL | Freq: Once | OROMUCOSAL | Status: AC

## 2021-11-29 MED ORDER — POVIDONE-IODINE 10 % EX SWAB
2.0000 | Freq: Once | CUTANEOUS | Status: AC
  Administered 2021-11-29: 2 via TOPICAL

## 2021-11-29 MED ORDER — PROPOFOL 500 MG/50ML IV EMUL
INTRAVENOUS | Status: DC | PRN
  Administered 2021-11-29: 75 ug/kg/min via INTRAVENOUS

## 2021-11-29 SURGICAL SUPPLY — 56 items
ATTUNE MED DOME PAT 38 KNEE (Knees) IMPLANT
ATTUNE PSFEM LTSZ5 NARCEM KNEE (Femur) IMPLANT
ATTUNE PSRP INSR SZ 5 10M KNEE (Insert) IMPLANT
BAG COUNTER SPONGE SURGICOUNT (BAG) IMPLANT
BAG SPEC THK2 15X12 ZIP CLS (MISCELLANEOUS) ×1
BAG SPNG CNTER NS LX DISP (BAG)
BAG ZIPLOCK 12X15 (MISCELLANEOUS) ×1 IMPLANT
BASEPLATE TIBIAL ROTATING SZ 4 (Knees) IMPLANT
BLADE SAG 18X100X1.27 (BLADE) ×1 IMPLANT
BLADE SAW SGTL 11.0X1.19X90.0M (BLADE) ×1 IMPLANT
BNDG ELASTIC 6X5.8 VLCR STR LF (GAUZE/BANDAGES/DRESSINGS) ×1 IMPLANT
BOWL SMART MIX CTS (DISPOSABLE) ×1 IMPLANT
BSPLAT TIB 4 CMNT ROT PLAT STR (Knees) ×1 IMPLANT
CEMENT HV SMART SET (Cement) ×2 IMPLANT
COVER SURGICAL LIGHT HANDLE (MISCELLANEOUS) ×1 IMPLANT
CUFF TOURN SGL QUICK 34 (TOURNIQUET CUFF) ×1
CUFF TRNQT CYL 34X4.125X (TOURNIQUET CUFF) ×1 IMPLANT
DRAPE INCISE IOBAN 66X45 STRL (DRAPES) ×1 IMPLANT
DRAPE U-SHAPE 47X51 STRL (DRAPES) ×1 IMPLANT
DRSG AQUACEL AG ADV 3.5X10 (GAUZE/BANDAGES/DRESSINGS) ×1 IMPLANT
DURAPREP 26ML APPLICATOR (WOUND CARE) ×1 IMPLANT
ELECT REM PT RETURN 15FT ADLT (MISCELLANEOUS) ×1 IMPLANT
GLOVE BIO SURGEON STRL SZ 6.5 (GLOVE) IMPLANT
GLOVE BIO SURGEON STRL SZ7.5 (GLOVE) IMPLANT
GLOVE BIO SURGEON STRL SZ8 (GLOVE) ×1 IMPLANT
GLOVE BIOGEL PI IND STRL 6.5 (GLOVE) IMPLANT
GLOVE BIOGEL PI IND STRL 7.0 (GLOVE) IMPLANT
GLOVE BIOGEL PI IND STRL 8 (GLOVE) ×1 IMPLANT
GOWN STRL REUS W/ TWL LRG LVL3 (GOWN DISPOSABLE) ×1 IMPLANT
GOWN STRL REUS W/ TWL XL LVL3 (GOWN DISPOSABLE) IMPLANT
GOWN STRL REUS W/TWL LRG LVL3 (GOWN DISPOSABLE) ×1
GOWN STRL REUS W/TWL XL LVL3 (GOWN DISPOSABLE)
HANDPIECE INTERPULSE COAX TIP (DISPOSABLE) ×1
HOLDER FOLEY CATH W/STRAP (MISCELLANEOUS) IMPLANT
IMMOBILIZER KNEE 20 (SOFTGOODS) ×1
IMMOBILIZER KNEE 20 THIGH 36 (SOFTGOODS) ×1 IMPLANT
KIT TURNOVER KIT A (KITS) IMPLANT
MANIFOLD NEPTUNE II (INSTRUMENTS) ×1 IMPLANT
NS IRRIG 1000ML POUR BTL (IV SOLUTION) ×1 IMPLANT
PACK TOTAL KNEE CUSTOM (KITS) ×1 IMPLANT
PADDING CAST ABS COTTON 6X4 NS (CAST SUPPLIES) IMPLANT
PADDING CAST COTTON 6X4 STRL (CAST SUPPLIES) ×2 IMPLANT
PIN STEINMAN FIXATION KNEE (PIN) IMPLANT
PROTECTOR NERVE ULNAR (MISCELLANEOUS) ×1 IMPLANT
SET HNDPC FAN SPRY TIP SCT (DISPOSABLE) ×1 IMPLANT
SPIKE FLUID TRANSFER (MISCELLANEOUS) ×1 IMPLANT
STRIP CLOSURE SKIN 1/2X4 (GAUZE/BANDAGES/DRESSINGS) ×2 IMPLANT
SUT MNCRL AB 4-0 PS2 18 (SUTURE) ×1 IMPLANT
SUT STRATAFIX 0 PDS 27 VIOLET (SUTURE) ×1
SUT VIC AB 2-0 CT1 27 (SUTURE) ×3
SUT VIC AB 2-0 CT1 TAPERPNT 27 (SUTURE) ×3 IMPLANT
SUTURE STRATFX 0 PDS 27 VIOLET (SUTURE) ×1 IMPLANT
TRAY FOLEY MTR SLVR 16FR STAT (SET/KITS/TRAYS/PACK) ×1 IMPLANT
TUBE SUCTION HIGH CAP CLEAR NV (SUCTIONS) ×1 IMPLANT
WATER STERILE IRR 1000ML POUR (IV SOLUTION) ×2 IMPLANT
WRAP KNEE MAXI GEL POST OP (GAUZE/BANDAGES/DRESSINGS) ×1 IMPLANT

## 2021-11-29 NOTE — Anesthesia Preprocedure Evaluation (Addendum)
Anesthesia Evaluation  Patient identified by MRN, date of birth, ID band Patient awake    Reviewed: Allergy & Precautions, NPO status , Patient's Chart, lab work & pertinent test results  Airway Mallampati: III  TM Distance: >3 FB Neck ROM: Full    Dental  (+) Dental Advisory Given   Pulmonary sleep apnea    breath sounds clear to auscultation       Cardiovascular hypertension, Pt. on medications  Rhythm:Regular Rate:Normal     Neuro/Psych negative neurological ROS     GI/Hepatic Neg liver ROS,GERD  ,,  Endo/Other  diabetes    Renal/GU negative Renal ROS     Musculoskeletal  (+) Arthritis ,    Abdominal   Peds  Hematology negative hematology ROS (+)   Anesthesia Other Findings   Reproductive/Obstetrics                              Lab Results  Component Value Date   WBC 9.8 11/19/2021   HGB 13.9 11/19/2021   HCT 44.1 11/19/2021   MCV 83.7 11/19/2021   PLT 325 11/19/2021   Lab Results  Component Value Date   CREATININE 0.94 11/19/2021   BUN 15 11/19/2021   NA 135 11/19/2021   K 4.3 11/19/2021   CL 100 11/19/2021   CO2 27 11/19/2021    Anesthesia Physical Anesthesia Plan  ASA: 2  Anesthesia Plan: Spinal and MAC   Post-op Pain Management: Regional block* and Ofirmev IV (intra-op)*   Induction:   PONV Risk Score and Plan: 2 and Dexamethasone, Ondansetron, Propofol infusion and Treatment may vary due to age or medical condition  Airway Management Planned: Natural Airway and Simple Face Mask  Additional Equipment:   Intra-op Plan:   Post-operative Plan:   Informed Consent: I have reviewed the patients History and Physical, chart, labs and discussed the procedure including the risks, benefits and alternatives for the proposed anesthesia with the patient or authorized representative who has indicated his/her understanding and acceptance.       Plan Discussed  with: CRNA  Anesthesia Plan Comments:          Anesthesia Quick Evaluation

## 2021-11-29 NOTE — Plan of Care (Signed)
Problem: Pain Managment: Goal: General experience of comfort will improve Outcome: Progressing   Problem: Activity: Goal: Risk for activity intolerance will decrease Outcome: Progressing   Problem: Clinical Measurements: Goal: Postoperative complications will be avoided or minimized Outcome: [place], RN 11/29/21 6:00 PM

## 2021-11-29 NOTE — Anesthesia Postprocedure Evaluation (Signed)
Anesthesia Post Note  Patient: Lindsay Pacheco  Procedure(s) Performed: TOTAL KNEE ARTHROPLASTY (Left: Knee)     Patient location during evaluation: PACU Anesthesia Type: Spinal Level of consciousness: awake and alert Pain management: pain level controlled Vital Signs Assessment: post-procedure vital signs reviewed and stable Respiratory status: spontaneous breathing and respiratory function stable Cardiovascular status: blood pressure returned to baseline and stable Postop Assessment: spinal receding Anesthetic complications: no  No notable events documented.  Last Vitals:  Vitals:   11/29/21 1209 11/29/21 1545  BP: 115/73 122/75  Pulse: 72 87  Resp: 20 20  Temp: 36.6 C 36.5 C  SpO2: 92% 95%    Last Pain:  Vitals:   11/29/21 1700  TempSrc:   PainSc: [name]

## 2021-11-29 NOTE — Evaluation (Signed)
Physical Therapy Evaluation Patient Details Name: Lindsay Pacheco MRN: [id number] DOB: 06-15-1960 Today's Date: 11/29/2021  History of Present Illness  61 y.o. female admitted 11/29/21 for L TKA. PMH: breast cancer, DM, HTN, depression, sleep apnea, R TKA 2011.  Clinical Impression  Pt is s/p TKA resulting in the deficits listed below (see PT Problem List). Pt ambulated 44' with RW, no loss of balance. Initiated TKA HEP. Good progress expected.  Pt will benefit from skilled PT to increase their independence and safety with mobility to allow discharge to the venue listed below.         Recommendations for follow up therapy are one component of a multi-disciplinary discharge planning process, led by the attending physician.  Recommendations may be updated based on patient status, additional functional criteria and insurance authorization.  Follow Up Recommendations Follow physician's recommendations for discharge plan and follow up therapies      Assistance Recommended at Discharge Intermittent Supervision/Assistance  Patient can return home with the following  A little help with walking and/or transfers;A little help with bathing/dressing/bathroom;Assistance with cooking/housework;Assist for transportation;Help with stairs or ramp for entrance    Equipment Recommendations Rolling walker (2 wheels)  Recommendations for Other Services       Functional Status Assessment Patient has had a recent decline in their functional status and demonstrates the ability to make significant improvements in function in a reasonable and predictable amount of time.     Precautions / Restrictions Precautions Precautions: Knee;Fall Precaution Booklet Issued: Yes (comment) Precaution Comments: reviewed no pillow under knee Restrictions Weight Bearing Restrictions: No Other Position/Activity Restrictions: wbat      Mobility  Bed Mobility Overal bed mobility: Needs Assistance Bed Mobility: Supine to  Sit     Supine to sit: Min assist, HOB elevated     General bed mobility comments: min A to advance LLE, used rail    Transfers Overall transfer level: Needs assistance Equipment used: Rolling walker (2 wheels) Transfers: Sit to/from Stand Sit to Stand: Min guard           General transfer comment: VCs hand placement, min/guard safety    Ambulation/Gait Ambulation/Gait assistance: Min guard Gait Distance (Feet): 34 Feet Assistive device: Rolling walker (2 wheels) Gait Pattern/deviations: Step-to pattern, Decreased step length - right, Decreased step length - left Gait velocity: decr     General Gait Details: VCs sequencing and positioning in RW, no loss of balance  Stairs            Wheelchair Mobility    Modified Rankin (Stroke Patients Only)       Balance Overall balance assessment: Modified Independent                                           Pertinent Vitals/Pain Pain Assessment Pain Assessment: 0-10 Pain Score: 3  Pain Location: L knee Pain Descriptors / Indicators: Operative site guarding, Discomfort Pain Intervention(s): Limited activity within patient's tolerance, Monitored during session, Premedicated before session, Ice applied    Home Living Family/patient expects to be discharged to:: Private residence Living Arrangements: Alone Available Help at Discharge: Family;Available 24 hours/day Type of Home: Apartment Home Access: Level entry       Home Layout: One level Home Equipment: Standard Walker;Cane - single point;Grab bars - toilet;Grab bars - tub/shower      Prior Function Prior Level of Function : Independent/Modified Independent  Mobility Comments: walked with cane       Hand Dominance        Extremity/Trunk Assessment   Upper Extremity Assessment Upper Extremity Assessment: Overall WFL for tasks assessed    Lower Extremity Assessment Lower Extremity Assessment: LLE  deficits/detail LLE Deficits / Details: -3/5 SLR LLE Sensation: WNL LLE Coordination: WNL       Communication   Communication: No difficulties  Cognition Arousal/Alertness: Awake/alert Behavior During Therapy: WFL for tasks assessed/performed Overall Cognitive Status: Within Functional Limits for tasks assessed                                          General Comments      Exercises Total Joint Exercises Ankle Circles/Pumps: AROM, Both, 10 reps, Supine Quad Sets: AROM, Left, 5 reps, Supine Heel Slides: AAROM, Left, 5 reps, Supine Long Arc Quad: AROM, Left, 5 reps, Seated Goniometric ROM: 5-60* AAROM L knee   Assessment/Plan    PT Assessment Patient needs continued PT services  PT Problem List Decreased strength;Decreased range of motion;Decreased activity tolerance;Decreased balance;Decreased mobility       PT Treatment Interventions DME instruction;Gait training;Stair training;Functional mobility training;Therapeutic exercise;Patient/family education;Therapeutic activities    PT Goals (Current goals can be found in the Care Plan section)  Acute Rehab PT Goals Patient Stated Goal: walk without pain PT Goal Formulation: With patient Time For Goal Achievement: 12/06/21 Potential to Achieve Goals: Good    Frequency 7X/week     Co-evaluation               AM-PAC PT "6 Clicks" Mobility  Outcome Measure Help needed turning from your back to your side while in a flat bed without using bedrails?: A Little Help needed moving from lying on your back to sitting on the side of a flat bed without using bedrails?: A Little Help needed moving to and from a bed to a chair (including a wheelchair)?: A Little Help needed standing up from a chair using your arms (e.g., wheelchair or bedside chair)?: A Little Help needed to walk in hospital room?: A Little Help needed climbing 3-5 steps with a railing? : A Lot 6 Click Score: 17    End of Session Equipment  Utilized During Treatment: Gait belt Activity Tolerance: Patient tolerated treatment well Patient left: in chair;with chair alarm set;with call bell/phone within reach;with family/visitor present Nurse Communication: Mobility status PT Visit Diagnosis: Difficulty in walking, not elsewhere classified (R26.2);Pain Pain - Right/Left: Left Pain - part of body: Knee    Time: 2878-6767 PT Time Calculation (min) (ACUTE ONLY): 29 min   Charges:   PT Evaluation $PT Eval Moderate Complexity: 1 Mod PT Treatments $Gait Training: 8-22 mins        Blondell Reveal Kistler PT 11/29/2021  Acute Rehabilitation Services  Office [phone]

## 2021-11-29 NOTE — Transfer of Care (Signed)
Immediate Anesthesia Transfer of Care Note  Patient: DARREN NODAL  Procedure(s) Performed: TOTAL KNEE ARTHROPLASTY (Left: Knee)  Patient Location: PACU  Anesthesia Type:Spinal  Level of Consciousness: drowsy and patient cooperative  Airway & Oxygen Therapy: Patient Spontanous Breathing and Patient connected to face mask oxygen  Post-op Assessment: Report given to RN and Post -op Vital signs reviewed and stable  Post vital signs: Reviewed and stable  Last Vitals:  Vitals Value Taken Time  BP 97/63 11/29/21 1004  Temp    Pulse 72 11/29/21 1005  Resp 15 11/29/21 1005  SpO2 100 % 11/29/21 1005  Vitals shown include unvalidated device data.  Last Pain:  Vitals:   11/29/21 0741  TempSrc:   PainSc: 0-No pain         Complications: No notable events documented.

## 2021-11-29 NOTE — Progress Notes (Signed)
Orthopedic Tech Progress Note Patient Details:  Lindsay Pacheco [birth year] [id number]  CPM Left Knee CPM Left Knee: On Left Knee Flexion (Degrees): 40 Left Knee Extension (Degrees): 10  Post Interventions Patient Tolerated: Well  Harlen Danford E Colin Norment 11/29/2021, 10:12 AM

## 2021-11-29 NOTE — Anesthesia Procedure Notes (Addendum)
Spinal  Patient location during procedure: OR Start time: 11/29/2021 8:17 AM End time: 11/29/2021 8:21 AM Reason for block: post-op pain management Staffing Performed: resident/CRNA  Anesthesiologist: Suzette Battiest, MD Resident/CRNA: Montel Clock, CRNA Performed by: Montel Clock, CRNA Authorized by: Suzette Battiest, MD   Preanesthetic Checklist Completed: patient identified, IV checked, risks and benefits discussed, surgical consent, monitors and equipment checked, pre-op evaluation and timeout performed Spinal Block Patient position: sitting Prep: DuraPrep Patient monitoring: heart rate, cardiac monitor, continuous pulse ox and blood pressure Approach: midline Location: L3-4 Injection technique: single-shot Needle Needle type: Pencan  Needle gauge: 24 G Needle length: 9 cm Needle insertion depth: 8 cm Assessment Sensory level: T6 Events: CSF return

## 2021-11-29 NOTE — Discharge Instructions (Addendum)
Lindsay Arabian, MD Total Joint Specialist [facility] [address], [address] [place], [place] [address] [phone]  TOTAL KNEE REPLACEMENT POSTOPERATIVE DIRECTIONS    Knee Rehabilitation, Guidelines Following Surgery  Results after knee surgery are often greatly improved when you follow the exercise, range of motion and muscle strengthening exercises prescribed by your doctor. Safety measures are also important to protect the knee from further injury. If any of these exercises cause you to have increased pain or swelling in your knee joint, decrease the amount until you are comfortable again and slowly increase them. If you have problems or questions, call your caregiver or physical therapist for advice.   BLOOD CLOT PREVENTION Take a 10 mg Xarelto once a day for three weeks following surgery. Then resume an 81 mg Aspirin once a day. You may resume your vitamins/supplements once you have discontinued the Xarelto. Do not take any NSAIDs (Advil, Aleve, Ibuprofen, Meloxicam, etc.) until you have discontinued the Xarelto.   HOME CARE INSTRUCTIONS  Remove items at home which could result in a fall. This includes throw rugs or furniture in walking pathways.  ICE to the affected knee as much as tolerated. Icing helps control swelling. If the swelling is well controlled you will be more comfortable and rehab easier. Continue to use ice on the knee for pain and swelling from surgery. You may notice swelling that will progress down to the foot and ankle. This is normal after surgery. Elevate the leg when you are not up walking on it.    Continue to use the breathing machine which will help keep your temperature down. It is common for your temperature to cycle up and down following surgery, especially at night when you are not up moving around and exerting yourself. The breathing machine keeps your lungs expanded and your temperature down. Do not place pillow under the operative  knee, focus on keeping the knee straight while resting  DIET You may resume your previous home diet once you are discharged from the hospital.  DRESSING / WOUND CARE / SHOWERING Keep your bulky bandage on for 2 days. On the third post-operative day you may remove the Ace bandage and gauze. There is a waterproof adhesive bandage on your skin which will stay in place until your first follow-up appointment. Once you remove this you will not need to place another bandage You may begin showering 3 days following surgery, but do not submerge the incision under water.  ACTIVITY For the first 5 days, the key is rest and control of pain and swelling Do your home exercises twice a day starting on post-operative day 3. On the days you go to physical therapy, just do the home exercises once that day. You should rest, ice and elevate the leg for 50 minutes out of every hour. Get up and walk/stretch for 10 minutes per hour. After 5 days you can increase your activity slowly as tolerated. Walk with your walker as instructed. Use the walker until you are comfortable transitioning to a cane. Walk with the cane in the opposite hand of the operative leg. You may discontinue the cane once you are comfortable and walking steadily. Avoid periods of inactivity such as sitting longer than an hour when not asleep. This helps prevent blood clots.  You may discontinue the knee immobilizer once you are able to perform a straight leg raise while lying down. You may resume a sexual relationship in one month or when given the OK by your  doctor.  You may return to work once you are cleared by your doctor.  Do not drive a car for 6 weeks or until released by your surgeon.  Do not drive while taking narcotics.  TED HOSE STOCKINGS Wear the elastic stockings on both legs for three weeks following surgery during the day. You may remove them at night for sleeping.  WEIGHT BEARING Weight bearing as tolerated with assist device  (walker, cane, etc) as directed, use it as long as suggested by your surgeon or therapist, typically at least 4-6 weeks.  POSTOPERATIVE CONSTIPATION PROTOCOL Constipation - defined medically as fewer than three stools per week and severe constipation as less than one stool per week.  One of the most common issues patients have following surgery is constipation.  Even if you have a regular bowel pattern at home, your normal regimen is likely to be disrupted due to multiple reasons following surgery.  Combination of anesthesia, postoperative narcotics, change in appetite and fluid intake all can affect your bowels.  In order to avoid complications following surgery, here are some recommendations in order to help you during your recovery period.  Colace (docusate) - Pick up an over-the-counter form of Colace or another stool softener and take twice a day as long as you are requiring postoperative pain medications.  Take with a full glass of water daily.  If you experience loose stools or diarrhea, hold the colace until you stool forms back up. If your symptoms do not get better within 1 week or if they get worse, check with your doctor. Dulcolax (bisacodyl) - Pick up over-the-counter and take as directed by the product packaging as needed to assist with the movement of your bowels.  Take with a full glass of water.  Use this product as needed if not relieved by Colace only.  MiraLax (polyethylene glycol) - Pick up over-the-counter to have on hand. MiraLax is a solution that will increase the amount of water in your bowels to assist with bowel movements.  Take as directed and can mix with a glass of water, juice, soda, coffee, or tea. Take if you go more than two days without a movement. Do not use MiraLax more than once per day. Call your doctor if you are still constipated or irregular after using this medication for 7 days in a row.  If you continue to have problems with postoperative constipation, please  contact the office for further assistance and recommendations.  If you experience "the worst abdominal pain ever" or develop nausea or vomiting, please contact the office immediatly for further recommendations for treatment.  ITCHING If you experience itching with your medications, try taking only a single pain pill, or even half a pain pill at a time.  You can also use Benadryl over the counter for itching or also to help with sleep.   MEDICATIONS See your medication summary on the "After Visit Summary" that the nursing staff will review with you prior to discharge.  You may have some home medications which will be placed on hold until you complete the course of blood thinner medication.  It is important for you to complete the blood thinner medication as prescribed by your surgeon.  Continue your approved medications as instructed at time of discharge.  PRECAUTIONS If you experience chest pain or shortness of breath - call 911 immediately for transfer to the hospital emergency department.  If you develop a fever greater that 101 F, purulent drainage from wound, increased redness or  drainage from wound, foul odor from the wound/dressing, or calf pain - CONTACT YOUR SURGEON.                                                   FOLLOW-UP APPOINTMENTS Make sure you keep all of your appointments after your operation with your surgeon and caregivers. You should call the office at the above phone number and make an appointment for approximately two weeks after the date of your surgery or on the date instructed by your surgeon outlined in the "After Visit Summary".  RANGE OF MOTION AND STRENGTHENING EXERCISES  Rehabilitation of the knee is important following a knee injury or an operation. After just a few days of immobilization, the muscles of the thigh which control the knee become weakened and shrink (atrophy). Knee exercises are designed to build up the tone and strength of the thigh muscles and to  improve knee motion. Often times heat used for twenty to thirty minutes before working out will loosen up your tissues and help with improving the range of motion but do not use heat for the first two weeks following surgery. These exercises can be done on a training (exercise) mat, on the floor, on a table or on a bed. Use what ever works the best and is most comfortable for you Knee exercises include:  Leg Lifts - While your knee is still immobilized in a splint or cast, you can do straight leg raises. Lift the leg to 60 degrees, hold for 3 sec, and slowly lower the leg. Repeat 10-20 times 2-3 times daily. Perform this exercise against resistance later as your knee gets better.  Quad and Hamstring Sets - Tighten up the muscle on the front of the thigh (Quad) and hold for 5-10 sec. Repeat this 10-20 times hourly. Hamstring sets are done by pushing the foot backward against an object and holding for 5-10 sec. Repeat as with quad sets.  Leg Slides: Lying on your back, slowly slide your foot toward your buttocks, bending your knee up off the floor (only go as far as is comfortable). Then slowly slide your foot back down until your leg is flat on the floor again. Angel Wings: Lying on your back spread your legs to the side as far apart as you can without causing discomfort.  A rehabilitation program following serious knee injuries can speed recovery and prevent re-injury in the future due to weakened muscles. Contact your doctor or a physical therapist for more information on knee rehabilitation.   POST-OPERATIVE OPIOID TAPER INSTRUCTIONS: It is important to wean off of your opioid medication as soon as possible. If you do not need pain medication after your surgery it is ok to stop day one. Opioids include: Codeine, Hydrocodone(Norco, Vicodin), Oxycodone(Percocet, oxycontin) and hydromorphone amongst others.  Long term and even short term use of opiods can cause: Increased pain  response Dependence Constipation Depression Respiratory depression And more.  Withdrawal symptoms can include Flu like symptoms Nausea, vomiting And more Techniques to manage these symptoms Hydrate well Eat regular healthy meals Stay active Use relaxation techniques(deep breathing, meditating, yoga) Do Not substitute Alcohol to help with tapering If you have been on opioids for less than two weeks and do not have pain than it is ok to stop all together.  Plan to wean off of opioids This plan  should start within one week post op of your joint replacement. Maintain the same interval or time between taking each dose and first decrease the dose.  Cut the total daily intake of opioids by one tablet each day Next start to increase the time between doses. The last dose that should be eliminated is the evening dose.   IF YOU ARE TRANSFERRED TO A SKILLED REHAB FACILITY If the patient is transferred to a skilled rehab facility following release from the hospital, a list of the current medications will be sent to the facility for the patient to continue.  When discharged from the skilled rehab facility, please have the facility set up the patient's [place] prior to being released. Also, the skilled facility will be responsible for providing the patient with their medications at time of release from the facility to include their pain medication, the muscle relaxants, and their blood thinner medication. If the patient is still at the rehab facility at time of the two week follow up appointment, the skilled rehab facility will also need to assist the patient in arranging follow up appointment in our office and any transportation needs.  MAKE SURE YOU:  Understand these instructions.  Get help right away if you are not doing well or get worse.   DENTAL ANTIBIOTICS:  In most cases prophylactic antibiotics for Dental procdeures after total joint surgery are not  necessary.  Exceptions are as follows:  1. History of prior total joint infection  2. Severely immunocompromised (Organ Transplant, cancer chemotherapy, Rheumatoid biologic medications such as [place])  3. Poorly controlled diabetes (A1C &gt; 8.0, blood glucose over 200)  If you have one of these conditions, contact your surgeon for an antibiotic prescription, prior to your dental procedure.    Pick up stool softner and laxative for home use following surgery while on pain medications. Do not submerge incision under water. Please use good hand washing techniques while changing dressing each day. May shower starting three days after surgery. Please use a clean towel to pat the incision dry following showers. Continue to use ice for pain and swelling after surgery. Do not use any lotions or creams on the incision until instructed by your surgeon.  Information on my medicine - XARELTO (Rivaroxaban)    Why was Xarelto prescribed for you? Xarelto was prescribed for you to reduce the risk of blood clots forming after orthopedic surgery. The medical term for these abnormal blood clots is venous thromboembolism (VTE).  What do you need to know about xarelto ? Take your Xarelto ONCE DAILY at the same time every day. You may take it either with or without food.  If you have difficulty swallowing the tablet whole, you may crush it and mix in applesauce just prior to taking your dose.  Take Xarelto exactly as prescribed by your doctor and DO NOT stop taking Xarelto without talking to the doctor who prescribed the medication.  Stopping without other VTE prevention medication to take the place of Xarelto may increase your risk of developing a clot.  After discharge, you should have regular check-up appointments with your healthcare provider that is prescribing your Xarelto.    What do you do if you miss a dose? If you miss a dose, take it as soon as you remember on the same day then  continue your regularly scheduled once daily regimen the next day. Do not take two doses of Xarelto on the same day.   Important Safety Information A possible  side effect of Xarelto is bleeding. You should call your healthcare provider right away if you experience any of the following: Bleeding from an injury or your nose that does not stop. Unusual colored urine (red or dark brown) or unusual colored stools (red or black). Unusual bruising for unknown reasons. A serious fall or if you hit your head (even if there is no bleeding).  Some medicines may interact with Xarelto and might increase your risk of bleeding while on Xarelto. To help avoid this, consult your healthcare provider or pharmacist prior to using any new prescription or non-prescription medications, including herbals, vitamins, non-steroidal anti-inflammatory drugs (NSAIDs) and supplements.  This website has more information on Xarelto: [email or web address]

## 2021-11-29 NOTE — Care Plan (Signed)
Ortho Bundle Case Management Note  Patient Details  Name: Lindsay Pacheco MRN: [id number] Date of Birth: 1960-06-12  L TKA on 11-29-21 DCP:  Home with dtr DME:  RW ordered through [place] PT:  [place] on 12-02-21                   DME Arranged:  [place] Rile rolling DME Agency:  [employer]  HH Arranged:  NA [place] Agency:  NA  Additional Comments: Please contact me with any questions of if this plan should need to change.  Marianne Sofia, RN,CCM [facility]  [phone] 11/29/2021, 4:23 PM

## 2021-11-29 NOTE — Op Note (Signed)
OPERATIVE REPORT-TOTAL KNEE ARTHROPLASTY   Pre-operative diagnosis- Osteoarthritis  Left knee(s)  Post-operative diagnosis- Osteoarthritis Left knee(s)  Procedure-  Left  Total Knee Arthroplasty  Surgeon- Dione Plover. Aritha Huckeba, MD  Assistant- Jaynie Bream, PA-C   Anesthesia-   Adductor canal block and spinal  EBL-50 mL   Drains None  Tourniquet time-  Total Tourniquet Time Documented: Thigh (Left) - 39 minutes Total: Thigh (Left) - 39 minutes     Complications- None  Condition-PACU - hemodynamically stable.   Brief Clinical Note  Lindsay Pacheco is a 61 y.o. year old female with end stage OA of her left knee with progressively worsening pain and dysfunction. She has constant pain, with activity and at rest and significant functional deficits with difficulties even with ADLs. She has had extensive non-op management including analgesics, injections of cortisone and viscosupplements, and home exercise program, but remains in significant pain with significant dysfunction. Radiographs show bone on bone arthritis medial and patellofemoral. She presents now for left Total Knee Arthroplasty.     Procedure in detail---   The patient is brought into the operating room and positioned supine on the operating table. After successful administration of  Adductor canal block and spinal,   a tourniquet is placed high on the  Left thigh(s) and the lower extremity is prepped and draped in the usual sterile fashion. Time out is performed by the operating team and then the  Left lower extremity is wrapped in Esmarch, knee flexed and the tourniquet inflated to 300 mmHg.       A midline incision is made with a ten blade through the subcutaneous tissue to the level of the extensor mechanism. A fresh blade is used to make a medial parapatellar arthrotomy. Soft tissue over the proximal medial tibia is subperiosteally elevated to the joint line with a knife and into the semimembranosus bursa with a Cobb  elevator. Soft tissue over the proximal lateral tibia is elevated with attention being paid to avoiding the patellar tendon on the tibial tubercle. The patella is everted, knee flexed 90 degrees and the ACL and PCL are removed. Findings are bone on bone all 3 compartments with massive global osteophyts        The drill is used to create a starting hole in the distal femur and the canal is thoroughly irrigated with sterile saline to remove the fatty contents. The 5 degree Left  valgus alignment guide is placed into the femoral canal and the distal femoral cutting block is pinned to remove 10 mm off the distal femur. Resection is made with an oscillating saw.      The tibia is subluxed forward and the menisci are removed. The extramedullary alignment guide is placed referencing proximally at the medial aspect of the tibial tubercle and distally along the second metatarsal axis and tibial crest. The block is pinned to remove 76m off the more deficient medial  side. Resection is made with an oscillating saw. Size 4is the most appropriate size for the tibia and the proximal tibia is prepared with the modular drill and keel punch for that size.      The femoral sizing guide is placed and size 5 is most appropriate. Rotation is marked off the epicondylar axis and confirmed by creating a rectangular flexion gap at 90 degrees. The size 5 cutting block is pinned in this rotation and the anterior, posterior and chamfer cuts are made with the oscillating saw. The intercondylar block is then placed and that cut is  made.      Trial size 4 tibial component, trial size 5 posterior stabilized femur and a 10  mm posterior stabilized rotating platform insert trial is placed. Full extension is achieved with excellent varus/valgus and anterior/posterior balance throughout full range of motion. The patella is everted and thickness measured to be 22  mm. Free hand resection is taken to 12 mm, a 38 template is placed, lug holes are  drilled, trial patella is placed, and it tracks normally. Osteophytes are removed off the posterior femur with the trial in place. All trials are removed and the cut bone surfaces prepared with pulsatile lavage. Cement is mixed and once ready for implantation, the size 4 tibial implant, size  5 posterior stabilized femoral component, and the size 38 patella are cemented in place and the patella is held with the clamp. The trial insert is placed and the knee held in full extension. The Exparel (20 ml mixed with 60 ml saline) is injected into the extensor mechanism, posterior capsule, medial and lateral gutters and subcutaneous tissues.  All extruded cement is removed and once the cement is hard the permanent 10 mm posterior stabilized rotating platform insert is placed into the tibial tray.      The wound is copiously irrigated with saline solution and the extensor mechanism closed with # 0 Stratofix suture. The tourniquet is released for a total tourniquet time of 38  minutes. Flexion against gravity is 140 degrees and the patella tracks normally. Subcutaneous tissue is closed with 2.0 vicryl and subcuticular with running 4.0 Monocryl. The incision is cleaned and dried and steri-strips and a bulky sterile dressing are applied. The limb is placed into a knee immobilizer and the patient is awakened and transported to recovery in stable condition.      Please note that a surgical assistant was a medical necessity for this procedure in order to perform it in a safe and expeditious manner. Surgical assistant was necessary to retract the ligaments and vital neurovascular structures to prevent injury to them and also necessary for proper positioning of the limb to allow for anatomic placement of the prosthesis.   Dione Plover Jaylena Holloway, MD    11/29/2021, 9:36 AM

## 2021-11-29 NOTE — Anesthesia Procedure Notes (Signed)
Anesthesia Regional Block: Adductor canal block   Pre-Anesthetic Checklist: , timeout performed,  Correct Patient, Correct Site, Correct Laterality,  Correct Procedure, Correct Position, site marked,  Risks and benefits discussed,  Surgical consent,  Pre-op evaluation,  At surgeon's request and post-op pain management  Laterality: Left  Prep: chloraprep       Needles:  Injection technique: Single-shot  Needle Type: Echogenic Needle     Needle Length: 9cm  Needle Gauge: 21     Additional Needles:   Procedures:,,,, ultrasound used (permanent image in chart),,    Narrative:  Start time: 11/29/2021 7:27 AM End time: 11/29/2021 7:33 AM Injection made incrementally with aspirations every 5 mL.  Performed by: Personally  Anesthesiologist: Suzette Battiest, MD

## 2021-11-29 NOTE — Interval H&P Note (Signed)
History and Physical Interval Note:  11/29/2021 6:28 AM  Lindsay Pacheco  has presented today for surgery, with the diagnosis of left knee osteoarthritis.  The various methods of treatment have been discussed with the patient and family. After consideration of risks, benefits and other options for treatment, the patient has consented to  Procedure(s): TOTAL KNEE ARTHROPLASTY (Left) as a surgical intervention.  The patient's history has been reviewed, patient examined, no change in status, stable for surgery.  I have reviewed the patient's chart and labs.  Questions were answered to the patient's satisfaction.     [name]

## 2021-11-30 ENCOUNTER — Encounter (HOSPITAL_COMMUNITY): Payer: Self-pay | Admitting: Orthopedic Surgery

## 2021-11-30 DIAGNOSIS — Z7982 Long term (current) use of aspirin: Secondary | ICD-10-CM | POA: Diagnosis not present

## 2021-11-30 DIAGNOSIS — M1712 Unilateral primary osteoarthritis, left knee: Secondary | ICD-10-CM | POA: Diagnosis not present

## 2021-11-30 DIAGNOSIS — E119 Type 2 diabetes mellitus without complications: Secondary | ICD-10-CM | POA: Diagnosis not present

## 2021-11-30 DIAGNOSIS — Z79899 Other long term (current) drug therapy: Secondary | ICD-10-CM | POA: Diagnosis not present

## 2021-11-30 DIAGNOSIS — Z96651 Presence of right artificial knee joint: Secondary | ICD-10-CM | POA: Diagnosis not present

## 2021-11-30 DIAGNOSIS — I10 Essential (primary) hypertension: Secondary | ICD-10-CM | POA: Diagnosis not present

## 2021-11-30 DIAGNOSIS — Z853 Personal history of malignant neoplasm of breast: Secondary | ICD-10-CM | POA: Diagnosis not present

## 2021-11-30 DIAGNOSIS — Z96652 Presence of left artificial knee joint: Secondary | ICD-10-CM | POA: Diagnosis not present

## 2021-11-30 DIAGNOSIS — Z7984 Long term (current) use of oral hypoglycemic drugs: Secondary | ICD-10-CM | POA: Diagnosis not present

## 2021-11-30 LAB — CBC
HCT: 35.1 % — ABNORMAL LOW (ref 36.0–46.0)
Hemoglobin: 11 g/dL — ABNORMAL LOW (ref 12.0–15.0)
MCH: 26.4 pg (ref 26.0–34.0)
MCHC: 31.3 g/dL (ref 30.0–36.0)
MCV: 84.2 fL (ref 80.0–100.0)
Platelets: 230 10*3/uL (ref 150–400)
RBC: 4.17 MIL/uL (ref 3.87–5.11)
RDW: 13.7 % (ref 11.5–15.5)
WBC: 13.8 10*3/uL — ABNORMAL HIGH (ref 4.0–10.5)
nRBC: 0 % (ref 0.0–0.2)

## 2021-11-30 LAB — BASIC METABOLIC PANEL
Anion gap: 8 (ref 5–15)
BUN: 12 mg/dL (ref 8–23)
CO2: 26 mmol/L (ref 22–32)
Calcium: 8.1 mg/dL — ABNORMAL LOW (ref 8.9–10.3)
Chloride: 99 mmol/L (ref 98–111)
Creatinine, Ser: 0.85 mg/dL (ref 0.44–1.00)
GFR, Estimated: >60 mL/min (ref >60)
Glucose, Bld: 170 mg/dL — ABNORMAL HIGH (ref 70–99)
Potassium: 4.3 mmol/L (ref 3.5–5.1)
Sodium: 133 mmol/L — ABNORMAL LOW (ref 135–145)

## 2021-11-30 LAB — GLUCOSE, CAPILLARY
Glucose-Capillary: 140 mg/dL — ABNORMAL HIGH (ref 70–99)
Glucose-Capillary: 142 mg/dL — ABNORMAL HIGH (ref 70–99)

## 2021-11-30 MED ORDER — RIVAROXABAN 10 MG PO TABS: 10.0000 mg | ORAL_TABLET | Freq: Every day | ORAL | 0 refills | Status: AC

## 2021-11-30 MED ORDER — METHOCARBAMOL 500 MG PO TABS: 500.0000 mg | ORAL_TABLET | Freq: Four times a day (QID) | ORAL | 0 refills | Status: DC | PRN

## 2021-11-30 MED ORDER — HYDROMORPHONE HCL 1 MG/ML IJ SOLN
0.5000 mg | INTRAMUSCULAR | Status: DC | PRN
  Administered 2021-11-30: 1 mg via INTRAVENOUS
  Filled 2021-11-30: qty 1

## 2021-11-30 MED ORDER — GABAPENTIN 300 MG PO CAPS: ORAL_CAPSULE | ORAL | 0 refills | Status: DC

## 2021-11-30 MED ORDER — OXYCODONE HCL 5 MG PO TABS: 5.0000 mg | ORAL_TABLET | Freq: Four times a day (QID) | ORAL | 0 refills | Status: DC | PRN

## 2021-11-30 NOTE — TOC Transition Note (Signed)
Transition of Care [facility]) - CM/SW Discharge Note   Patient Details  Name: Lindsay Pacheco MRN: [id number] Date of Birth: 1960-12-14  [facility] CM/SW Contact:  Lennart Pall, LCSW Phone Number: 11/30/2021, 9:35 AM   Clinical Narrative:    Met with pt and confirming she has received RW via [employer] to room.  OPPT already arranged with [facility].  No further TOC needs.   Final next level of care: OP Rehab Barriers to Discharge: No Barriers Identified   Patient Goals and CMS Choice Patient states their goals for this hospitalization and ongoing recovery are:: return home      Discharge Placement                       Discharge Plan and Services                DME Arranged: [place] rolling DME Agency: [employer]       HH Arranged: NA [place] Agency: NA        Social Determinants of Health (SDOH) Interventions     Readmission Risk Interventions     No data to display

## 2021-11-30 NOTE — Progress Notes (Signed)
Physical Therapy Treatment Patient Details Name: Lindsay Pacheco MRN: [id number] DOB: 1960-02-22 Today's Date: 11/30/2021   History of Present Illness 61 y.o. female admitted 11/29/21 for L TKA. PMH: breast cancer, DM, HTN, depression, sleep apnea, R TKA 2011.    PT Comments    Pt is drowsy this morning. She easily dozes off. Min A for mobility. Moderate pain with activity. Will plan to have a 2nd session prior to possible d/c home later today.    Recommendations for follow up therapy are one component of a multi-disciplinary discharge planning process, led by the attending physician.  Recommendations may be updated based on patient status, additional functional criteria and insurance authorization.  Follow Up Recommendations  Follow physician's recommendations for discharge plan and follow up therapies     Assistance Recommended at Discharge Intermittent Supervision/Assistance  Patient can return home with the following A little help with walking and/or transfers;A little help with bathing/dressing/bathroom;Assistance with cooking/housework;Assist for transportation;Help with stairs or ramp for entrance   Equipment Recommendations  Rolling walker (2 wheels)    Recommendations for Other Services       Precautions / Restrictions Precautions Precautions: Knee;Fall Restrictions Weight Bearing Restrictions: No LLE Weight Bearing: Weight bearing as tolerated     Mobility  Bed Mobility Overal bed mobility: Needs Assistance Bed Mobility: Supine to Sit     Supine to sit: Supervision     General bed mobility comments: Supv for safety    Transfers Overall transfer level: Needs assistance Equipment used: Rolling walker (2 wheels) Transfers: Sit to/from Stand Sit to Stand: Min assist           General transfer comment: Min guard to rise. Min A to stabilize initially with standing. LOB x 1    Ambulation/Gait Ambulation/Gait assistance: Min guard Gait Distance (Feet): 75  Feet Assistive device: Rolling walker (2 wheels) Gait Pattern/deviations: Step-to pattern       General Gait Details: Min guard for safety. Slow gait speed. Cues for safety, sequencing.   Stairs             Wheelchair Mobility    Modified Rankin (Stroke Patients Only)       Balance Overall balance assessment: Mild deficits observed, not formally tested                                          Cognition Arousal/Alertness: Awake/alert, Suspect due to medications (drowsy) Behavior During Therapy: WFL for tasks assessed/performed Overall Cognitive Status: Within Functional Limits for tasks assessed                                 General Comments: drowsy/dozing off easily        Exercises Total Joint Exercises Ankle Circles/Pumps: AROM, Both, 10 reps Quad Sets: AROM, Both, 10 reps Hip ABduction/ADduction: AAROM, Left, 10 reps Straight Leg Raises: AAROM, 10 reps Goniometric ROM: ~10-85 degrees    General Comments        Pertinent Vitals/Pain Pain Assessment Pain Assessment: 0-10 Pain Score: 5  Pain Location: L knee Pain Descriptors / Indicators: Discomfort, Sore Pain Intervention(s): Monitored during session, Repositioned    Home Living                          Prior Function  PT Goals (current goals can now be found in the care plan section) Progress towards PT goals: Progressing toward goals    Frequency    7X/week      PT Plan Current plan remains appropriate    Co-evaluation              AM-PAC PT "6 Clicks" Mobility   Outcome Measure  Help needed turning from your back to your side while in a flat bed without using bedrails?: None Help needed moving from lying on your back to sitting on the side of a flat bed without using bedrails?: None Help needed moving to and from a bed to a chair (including a wheelchair)?: A Little Help needed standing up from a chair using your arms  (e.g., wheelchair or bedside chair)?: A Little Help needed to walk in hospital room?: A Little Help needed climbing 3-5 steps with a railing? : A Little 6 Click Score: 20    End of Session Equipment Utilized During Treatment: Gait belt Activity Tolerance: Patient tolerated treatment well Patient left: in chair;with call bell/phone within reach   PT Visit Diagnosis: Difficulty in walking, not elsewhere classified (R26.2) Pain - Right/Left: Left Pain - part of body: Knee     Time: 1030-1057 PT Time Calculation (min) (ACUTE ONLY): 27 min  Charges:  $Gait Training: 8-22 mins $Therapeutic Exercise: 8-22 mins                         Doreatha Massed, PT Acute Rehabilitation  Office: [phone]

## 2021-11-30 NOTE — Progress Notes (Signed)
Physical Therapy Treatment Patient Details Name: Lindsay Pacheco MRN: [id number] DOB: 1960/10/21 Today's Date: 11/30/2021   History of Present Illness 61 y.o. female admitted 11/29/21 for L TKA. PMH: breast cancer, DM, HTN, depression, sleep apnea, R TKA 2011.    PT Comments    Pt participated well. She was not drowsy this session. She did report increased pain this afternoon-rated 8/10. Encouraged pt to ambulate often, as tolerated. She has been issued a HEP for her to follow as tolerated as well. Pt reports she does not have any steps she has to negotiate to gain entry into her home. All PT education completed.     Recommendations for follow up therapy are one component of a multi-disciplinary discharge planning process, led by the attending physician.  Recommendations may be updated based on patient status, additional functional criteria and insurance authorization.  Follow Up Recommendations  Follow physician's recommendations for discharge plan and follow up therapies     Assistance Recommended at Discharge Intermittent Supervision/Assistance  Patient can return home with the following A little help with walking and/or transfers;A little help with bathing/dressing/bathroom;Assistance with cooking/housework;Assist for transportation;Help with stairs or ramp for entrance   Equipment Recommendations  Rolling walker (2 wheels)    Recommendations for Other Services       Precautions / Restrictions Precautions Precautions: Fall;Knee Precaution Comments: reviewed no pillow under knee Restrictions Weight Bearing Restrictions: No LLE Weight Bearing: Weight bearing as tolerated     Mobility  Bed Mobility Overal bed mobility: Needs Assistance Bed Mobility: Sit to Supine     Supine to sit: Supervision Sit to supine: Min assist   General bed mobility comments: Assist for L LE onto bed.    Transfers Overall transfer level: Needs assistance Equipment used: Rolling walker (2  wheels) Transfers: Sit to/from Stand Sit to Stand: Min guard           General transfer comment: Min guard for safety. Cues for safety, hand/LE placement.    Ambulation/Gait Ambulation/Gait assistance: Min guard Gait Distance (Feet): 75 Feet Assistive device: Rolling walker (2 wheels) Gait Pattern/deviations: Step-to pattern       General Gait Details: Min guard for safety. Slow gait speed. Cues for safety, RW proximity.   Stairs             Wheelchair Mobility    Modified Rankin (Stroke Patients Only)       Balance Overall balance assessment: Needs assistance         Standing balance support: Bilateral upper extremity supported, Reliant on assistive device for balance, During functional activity Standing balance-Leahy Scale: Poor                              Cognition Arousal/Alertness: Awake/alert Behavior During Therapy: WFL for tasks assessed/performed Overall Cognitive Status: Within Functional Limits for tasks assessed                                 General Comments: drowsy/dozing off easily        Exercises     General Comments        Pertinent Vitals/Pain Pain Assessment Pain Assessment: 0-10 Pain Score: 8  Pain Location: L knee Pain Descriptors / Indicators: Discomfort, Sore, Aching Pain Intervention(s): Limited activity within patient's tolerance, Monitored during session, Ice applied, Repositioned    Home Living  Prior Function            PT Goals (current goals can now be found in the care plan section) Progress towards PT goals: Progressing toward goals    Frequency    7X/week      PT Plan Current plan remains appropriate    Co-evaluation              AM-PAC PT "6 Clicks" Mobility   Outcome Measure  Help needed turning from your back to your side while in a flat bed without using bedrails?: None Help needed moving from lying on your back to  sitting on the side of a flat bed without using bedrails?: None Help needed moving to and from a bed to a chair (including a wheelchair)?: A Little Help needed standing up from a chair using your arms (e.g., wheelchair or bedside chair)?: A Little Help needed to walk in hospital room?: A Little Help needed climbing 3-5 steps with a railing? : A Little 6 Click Score: 20    End of Session Equipment Utilized During Treatment: Gait belt Activity Tolerance: Patient tolerated treatment well Patient left: in bed;with call bell/phone within reach;with family/visitor present   PT Visit Diagnosis: Other abnormalities of gait and mobility (R26.89) Pain - Right/Left: Left Pain - part of body: Knee     Time: 1540-1555 PT Time Calculation (min) (ACUTE ONLY): 15 min  Charges:  $Gait Training: 8-22 mins                         Doreatha Massed, PT Acute Rehabilitation  Office: [phone]

## 2021-11-30 NOTE — Progress Notes (Signed)
Subjective: 1 Day Post-Op Procedure(s) (LRB): TOTAL KNEE ARTHROPLASTY (Left) Patient reports pain as mild.   Patient seen in rounds by Dr. Wynelle Link. Patient is well, and has had no acute complaints or problems No issues overnight. Denies chest pain, SOB, or calf pain. Foley catheter removed this AM.  We will continue therapy today, ambulated 39' yesterday.   Objective: Vital signs in last 24 hours: Temp:  [97.6 F (36.4 C)-98 F (36.7 C)] 97.6 F (36.4 C) (11/28 0423) Pulse Rate:  [71-94] 94 (11/28 0423) Resp:  [13-20] 16 (11/28 0423) BP: (96-156)/(51-91) 156/91 (11/28 0423) SpO2:  [89 %-97 %] 95 % (11/28 0423)  Intake/Output from previous day:  Intake/Output Summary (Last 24 hours) at 11/30/2021 0720 Last data filed at 11/30/2021 0600 Gross per 24 hour  Intake 3783.83 ml  Output 1375 ml  Net 2408.83 ml     Intake/Output this shift: No intake/output data recorded.  Labs: Recent Labs    11/30/21 0323  HGB 11.0*   Recent Labs    11/30/21 0323  WBC 13.8*  RBC 4.17  HCT 35.1*  PLT 230   Recent Labs    11/30/21 0323  NA 133*  K 4.3  CL 99  CO2 26  BUN 12  CREATININE 0.85  GLUCOSE 170*  CALCIUM 8.1*   No results for input(s): "LABPT", "INR" in the last 72 hours.  Exam: General - Patient is Alert and Oriented Extremity - Neurologically intact Neurovascular intact Sensation intact distally Dorsiflexion/Plantar flexion intact Dressing - dressing C/D/I Motor Function - intact, moving foot and toes well on exam.   Past Medical History:  Diagnosis Date   Anxiety    Arthritis    "knees" (11/02/2015)   Breast cancer ([place]) 2017   left breast    Breast cancer, left breast ([place]) 09/30/2015   Cataracts, bilateral    just watching right now   Depression    Diabetes mellitus, type II ([place])    Diverticulitis 1987   Family history of breast cancer    Family history of polyps in the colon    GERD (gastroesophageal reflux disease)    Hyperlipidemia     Hypertension    Irritable bowel syndrome (IBS)    Personal history of radiation therapy    Sleep apnea    does not use CPAP but "I'm suppose to" (11/02/2015)   SVD (spontaneous vaginal delivery)    x 1    Assessment/Plan: 1 Day Post-Op Procedure(s) (LRB): TOTAL KNEE ARTHROPLASTY (Left) Principal Problem:   OA (osteoarthritis) of knee Active Problems:   Osteoarthritis of left knee  Estimated body mass index is 39.12 kg/m as calculated from the following:   Height as of this encounter: 5' 7.5" (1.715 m).   Weight as of this encounter: 115 kg. Advance diet Up with therapy D/C IV fluids   Patient's anticipated LOS is less than 2 midnights, meeting these requirements: - Younger than 54 - Lives within 1 hour of care - Has a competent adult at home to recover with post-op recover - NO history of  - Chronic pain requiring opioids  - Coronary Artery Disease  - Heart failure  - Heart attack  - Stroke  - DVT/VTE  - Cardiac arrhythmia  - Respiratory Failure/COPD  - Renal failure  - Anemia  - Advanced Liver disease  DVT Prophylaxis - Xarelto Weight bearing as tolerated. Continue therapy.  Plan is to go Home after hospital stay. Plan for discharge later today if progresses with therapy and  meeting goals. Scheduled for OPPT at [facility]. Follow-up in the office in 2 weeks.  The PDMP database was reviewed today prior to any opioid medications being prescribed to this patient.  Theresa Duty, PA-C Orthopedic Surgery [phone] 11/30/2021, 7:20 AM

## 2021-11-30 NOTE — Plan of Care (Signed)
Problem: Education: Goal: Ability to describe self-care measures that may prevent or decrease complications (Diabetes Survival Skills Education) will improve Outcome: Progressing   Problem: Activity: Goal: Ability to avoid complications of mobility impairment will improve Outcome: Progressing   Problem: Clinical Measurements: Goal: Postoperative complications will be avoided or minimized Outcome: Progressing   Problem: Pain Management: Goal: Pain level will decrease with appropriate interventions Outcome: [place], RN 11/30/21 9:20 AM

## 2021-11-30 NOTE — Plan of Care (Signed)
Patient discharging home via private vehicle with daughter. Ivan Anchors, RN 11/30/21 4:14 PM

## 2021-11-30 NOTE — Progress Notes (Signed)
Patient drowsy and desiring to take a nap. When sleeping, patient has snoring respirations and oxygen saturations decrease. Patient has history of obstructive sleep apnea. Personal mask and tubing at bedside. RT contacted to set up CPAP machine for nap time. PT contacted to defer second PT session until later in day. Ivan Anchors, RN 11/30/21 1:36 PM

## 2021-12-01 NOTE — Discharge Summary (Signed)
Patient ID: Lindsay Pacheco MRN: [id number] DOB/AGE: [birth year] 61 y.o.  Admit date: 11/29/2021 Discharge date: 11/30/2021  Admission Diagnoses:  Principal Problem:   OA (osteoarthritis) of knee Active Problems:   Osteoarthritis of left knee   Discharge Diagnoses:  Same  Past Medical History:  Diagnosis Date   Anxiety    Arthritis    "knees" (11/02/2015)   Breast cancer ([place]) 2017   left breast    Breast cancer, left breast ([place]) 09/30/2015   Cataracts, bilateral    just watching right now   Depression    Diabetes mellitus, type II ([place])    Diverticulitis 1987   Family history of breast cancer    Family history of polyps in the colon    GERD (gastroesophageal reflux disease)    Hyperlipidemia    Hypertension    Irritable bowel syndrome (IBS)    Personal history of radiation therapy    Sleep apnea    does not use CPAP but "I'm suppose to" (11/02/2015)   SVD (spontaneous vaginal delivery)    x 1    Surgeries: Procedure(s): TOTAL KNEE ARTHROPLASTY on 11/29/2021   Consultants:   Discharged Condition: Improved  Hospital Course: Lindsay Pacheco is an 61 y.o. female who was admitted 11/29/2021 for operative treatment ofOA (osteoarthritis) of knee. Patient has severe unremitting pain that affects sleep, daily activities, and work/hobbies. After pre-op clearance the patient was taken to the operating room on 11/29/2021 and underwent  Procedure(s): TOTAL KNEE ARTHROPLASTY.    Patient was given perioperative antibiotics:  Anti-infectives (From admission, onward)    Start     Dose/Rate Route Frequency Ordered Stop   11/29/21 1400  ceFAZolin (ANCEF) IVPB 2g/100 mL premix        2 g 200 mL/hr over 30 Minutes Intravenous Every 6 hours 11/29/21 1130 11/29/21 2100   11/29/21 0600  ceFAZolin (ANCEF) IVPB 2g/100 mL premix        2 g 200 mL/hr over 30 Minutes Intravenous On call to O.R. 11/29/21 9937 11/29/21 0837        Patient was given sequential compression devices,  early ambulation, and chemoprophylaxis to prevent DVT.  Patient benefited maximally from hospital stay and there were no complications.    Recent vital signs: Patient Vitals for the past 24 hrs:  BP Temp Pulse Resp SpO2  11/30/21 1343 115/83 97.7 F (36.5 C) 84 18 97 %     Recent laboratory studies:  Recent Labs    11/30/21 0323  WBC 13.8*  HGB 11.0*  HCT 35.1*  PLT 230  NA 133*  K 4.3  CL 99  CO2 26  BUN 12  CREATININE 0.85  GLUCOSE 170*  CALCIUM 8.1*     Discharge Medications:   Allergies as of 11/30/2021       Reactions   Sulfa Antibiotics Nausea And Vomiting        Medication List     STOP taking these medications    aspirin 81 MG tablet       TAKE these medications    acetaminophen 500 MG tablet Commonly known as: TYLENOL Take 1,000 mg by mouth every 6 (six) hours as needed for headache or moderate pain.   atorvastatin 10 MG tablet Commonly known as: LIPITOR Take 1 tablet (10 mg total) by mouth daily. For high cholesterol   bismuth subsalicylate 169 CV/89FY suspension Commonly known as: PEPTO BISMOL Take 30 mLs by mouth every 6 (six) hours as needed for indigestion or diarrhea or  loose stools.   buPROPion 150 MG 24 hr tablet Commonly known as: WELLBUTRIN XL Take 150 mg by mouth daily.   dicyclomine 10 MG capsule Commonly known as: BENTYL Take 1 capsule (10 mg total) by mouth every 12 (twelve) hours as needed for spasms (abdominal pain).   DULoxetine 60 MG capsule Commonly known as: CYMBALTA Take 1 capsule (60 mg total) by mouth 2 (two) times daily. For mood control What changed:  when to take this additional instructions   Farxiga 10 MG Tabs tablet Generic drug: dapagliflozin propanediol Take 10 mg by mouth daily.   gabapentin 300 MG capsule Commonly known as: NEURONTIN Take a 300 mg capsule three times a day for two weeks following surgery.Then take a 300 mg capsule two times a day for two weeks. Then take a 300 mg capsule once  a day for two weeks. Then discontinue.   glimepiride 2 MG tablet Commonly known as: AMARYL Take 2 mg by mouth daily.   lisinopril 20 MG tablet Commonly known as: ZESTRIL Take 1 tablet (20 mg total) by mouth daily. For high blood pressure   loratadine 10 MG tablet Commonly known as: CLARITIN Take 10 mg by mouth daily. Pt just switched to Claritin 10 mg daily instead of Zyrtec.   metFORMIN 500 MG tablet Commonly known as: GLUCOPHAGE Take 1,000 mg by mouth 2 (two) times daily with a meal.   methocarbamol 500 MG tablet Commonly known as: ROBAXIN Take 1 tablet (500 mg total) by mouth every 6 (six) hours as needed for muscle spasms.   NON FORMULARY Pt uses a bi-pap   omeprazole 40 MG capsule Commonly known as: PRILOSEC TAKE ONE CAPSULE BY MOUTH DAILY   oxyCODONE 5 MG immediate release tablet Commonly known as: Oxy IR/ROXICODONE Take 1-2 tablets (5-10 mg total) by mouth every 6 (six) hours as needed for severe pain or moderate pain.   Ozempic (0.25 or 0.5 MG/DOSE) 2 MG/1.5ML Sopn Generic drug: Semaglutide(0.25 or 0.'5MG'$ /DOS) Inject into the skin.   rivaroxaban 10 MG Tabs tablet Commonly known as: XARELTO Take 1 tablet (10 mg total) by mouth daily with breakfast for 20 days. Then resume one 81 mg aspirin once a day.   SYSTANE OP Place 1 drop into both eyes 2 (two) times daily as needed (dry eyes).   traZODone 50 MG tablet Commonly known as: DESYREL Take 1 tablet (50 mg total) by mouth at bedtime as needed for sleep.               Discharge Care Instructions  (From admission, onward)           Start     Ordered   11/30/21 0000  Weight bearing as tolerated        11/30/21 0723   11/30/21 0000  Change dressing       Comments: You may remove the bulky bandage (ACE wrap and gauze) two days after surgery. You will have an adhesive waterproof bandage underneath. Leave this in place until your first follow-up appointment.   11/30/21 0723             Diagnostic Studies: No results found.  Disposition: Discharge disposition: 01-Home or Self Care       Discharge Instructions     Call MD / Call 911   Complete by: As directed    If you experience chest pain or shortness of breath, CALL 911 and be transported to the hospital emergency room.  If you develope a fever above 101  F, pus (white drainage) or increased drainage or redness at the wound, or calf pain, call your surgeon's office.   Change dressing   Complete by: As directed    You may remove the bulky bandage (ACE wrap and gauze) two days after surgery. You will have an adhesive waterproof bandage underneath. Leave this in place until your first follow-up appointment.   Constipation Prevention   Complete by: As directed    Drink plenty of fluids.  Prune juice may be helpful.  You may use a stool softener, such as Colace (over the counter) 100 mg twice a day.  Use MiraLax (over the counter) for constipation as needed.   Diet - low sodium heart healthy   Complete by: As directed    Do not put a pillow under the knee. Place it under the heel.   Complete by: As directed    Driving restrictions   Complete by: As directed    No driving for two weeks   Post-operative opioid taper instructions:   Complete by: As directed    POST-OPERATIVE OPIOID TAPER INSTRUCTIONS: It is important to wean off of your opioid medication as soon as possible. If you do not need pain medication after your surgery it is ok to stop day one. Opioids include: Codeine, Hydrocodone(Norco, Vicodin), Oxycodone(Percocet, oxycontin) and hydromorphone amongst others.  Long term and even short term use of opiods can cause: Increased pain response Dependence Constipation Depression Respiratory depression And more.  Withdrawal symptoms can include Flu like symptoms Nausea, vomiting And more Techniques to manage these symptoms Hydrate well Eat regular healthy meals Stay active Use relaxation  techniques(deep breathing, meditating, yoga) Do Not substitute Alcohol to help with tapering If you have been on opioids for less than two weeks and do not have pain than it is ok to stop all together.  Plan to wean off of opioids This plan should start within one week post op of your joint replacement. Maintain the same interval or time between taking each dose and first decrease the dose.  Cut the total daily intake of opioids by one tablet each day Next start to increase the time between doses. The last dose that should be eliminated is the evening dose.      TED hose   Complete by: As directed    Use stockings (TED hose) for three weeks on both leg(s).  You may remove them at night for sleeping.   Weight bearing as tolerated   Complete by: As directed         Follow-up Information     Kay Shippy L, PA. Go on 12/14/2021.   Specialty: Orthopedic Surgery Why: You are scheduled for a follow up appointment on 12-14-21 at 1:15 pm. Contact information: [address] [place] [address] [phone]                  Signed: Theresa Duty 12/01/2021, 10:41 AM

## 2021-12-02 DIAGNOSIS — Z471 Aftercare following joint replacement surgery: Secondary | ICD-10-CM | POA: Diagnosis not present

## 2021-12-02 DIAGNOSIS — R2689 Other abnormalities of gait and mobility: Secondary | ICD-10-CM | POA: Diagnosis not present

## 2021-12-02 DIAGNOSIS — M25562 Pain in left knee: Secondary | ICD-10-CM | POA: Diagnosis not present

## 2021-12-03 DIAGNOSIS — M25562 Pain in left knee: Secondary | ICD-10-CM | POA: Diagnosis not present

## 2021-12-03 DIAGNOSIS — Z471 Aftercare following joint replacement surgery: Secondary | ICD-10-CM | POA: Diagnosis not present

## 2021-12-03 DIAGNOSIS — R2689 Other abnormalities of gait and mobility: Secondary | ICD-10-CM | POA: Diagnosis not present

## 2021-12-06 DIAGNOSIS — M25562 Pain in left knee: Secondary | ICD-10-CM | POA: Diagnosis not present

## 2021-12-06 DIAGNOSIS — Z471 Aftercare following joint replacement surgery: Secondary | ICD-10-CM | POA: Diagnosis not present

## 2021-12-06 DIAGNOSIS — R2689 Other abnormalities of gait and mobility: Secondary | ICD-10-CM | POA: Diagnosis not present

## 2021-12-08 DIAGNOSIS — M25562 Pain in left knee: Secondary | ICD-10-CM | POA: Diagnosis not present

## 2021-12-08 DIAGNOSIS — R2689 Other abnormalities of gait and mobility: Secondary | ICD-10-CM | POA: Diagnosis not present

## 2021-12-08 DIAGNOSIS — Z471 Aftercare following joint replacement surgery: Secondary | ICD-10-CM | POA: Diagnosis not present

## 2021-12-10 DIAGNOSIS — M25562 Pain in left knee: Secondary | ICD-10-CM | POA: Diagnosis not present

## 2021-12-10 DIAGNOSIS — R2689 Other abnormalities of gait and mobility: Secondary | ICD-10-CM | POA: Diagnosis not present

## 2021-12-10 DIAGNOSIS — Z471 Aftercare following joint replacement surgery: Secondary | ICD-10-CM | POA: Diagnosis not present

## 2021-12-13 DIAGNOSIS — Z471 Aftercare following joint replacement surgery: Secondary | ICD-10-CM | POA: Diagnosis not present

## 2021-12-13 DIAGNOSIS — M25562 Pain in left knee: Secondary | ICD-10-CM | POA: Diagnosis not present

## 2021-12-13 DIAGNOSIS — R2689 Other abnormalities of gait and mobility: Secondary | ICD-10-CM | POA: Diagnosis not present

## 2021-12-16 DIAGNOSIS — Z471 Aftercare following joint replacement surgery: Secondary | ICD-10-CM | POA: Diagnosis not present

## 2021-12-16 DIAGNOSIS — R2689 Other abnormalities of gait and mobility: Secondary | ICD-10-CM | POA: Diagnosis not present

## 2021-12-16 DIAGNOSIS — M25562 Pain in left knee: Secondary | ICD-10-CM | POA: Diagnosis not present

## 2021-12-21 ENCOUNTER — Other Ambulatory Visit: Payer: Medicare Other

## 2021-12-22 DIAGNOSIS — Z471 Aftercare following joint replacement surgery: Secondary | ICD-10-CM | POA: Diagnosis not present

## 2021-12-22 DIAGNOSIS — M25562 Pain in left knee: Secondary | ICD-10-CM | POA: Diagnosis not present

## 2021-12-22 DIAGNOSIS — R2689 Other abnormalities of gait and mobility: Secondary | ICD-10-CM | POA: Diagnosis not present

## 2021-12-29 DIAGNOSIS — R2689 Other abnormalities of gait and mobility: Secondary | ICD-10-CM | POA: Diagnosis not present

## 2021-12-29 DIAGNOSIS — M25562 Pain in left knee: Secondary | ICD-10-CM | POA: Diagnosis not present

## 2021-12-29 DIAGNOSIS — Z471 Aftercare following joint replacement surgery: Secondary | ICD-10-CM | POA: Diagnosis not present

## 2021-12-31 DIAGNOSIS — M25562 Pain in left knee: Secondary | ICD-10-CM | POA: Diagnosis not present

## 2021-12-31 DIAGNOSIS — R2689 Other abnormalities of gait and mobility: Secondary | ICD-10-CM | POA: Diagnosis not present

## 2021-12-31 DIAGNOSIS — Z471 Aftercare following joint replacement surgery: Secondary | ICD-10-CM | POA: Diagnosis not present

## 2022-01-04 DIAGNOSIS — Z5189 Encounter for other specified aftercare: Secondary | ICD-10-CM | POA: Diagnosis not present

## 2022-01-05 DIAGNOSIS — Z471 Aftercare following joint replacement surgery: Secondary | ICD-10-CM | POA: Diagnosis not present

## 2022-01-05 DIAGNOSIS — R2689 Other abnormalities of gait and mobility: Secondary | ICD-10-CM | POA: Diagnosis not present

## 2022-01-05 DIAGNOSIS — M25562 Pain in left knee: Secondary | ICD-10-CM | POA: Diagnosis not present

## 2022-01-06 DIAGNOSIS — R809 Proteinuria, unspecified: Secondary | ICD-10-CM | POA: Diagnosis not present

## 2022-01-06 DIAGNOSIS — Z299 Encounter for prophylactic measures, unspecified: Secondary | ICD-10-CM | POA: Diagnosis not present

## 2022-01-06 DIAGNOSIS — I10 Essential (primary) hypertension: Secondary | ICD-10-CM | POA: Diagnosis not present

## 2022-01-06 DIAGNOSIS — E1165 Type 2 diabetes mellitus with hyperglycemia: Secondary | ICD-10-CM | POA: Diagnosis not present

## 2022-01-06 DIAGNOSIS — Z789 Other specified health status: Secondary | ICD-10-CM | POA: Diagnosis not present

## 2022-01-06 DIAGNOSIS — E1129 Type 2 diabetes mellitus with other diabetic kidney complication: Secondary | ICD-10-CM | POA: Diagnosis not present

## 2022-01-07 DIAGNOSIS — R2689 Other abnormalities of gait and mobility: Secondary | ICD-10-CM | POA: Diagnosis not present

## 2022-01-07 DIAGNOSIS — M25562 Pain in left knee: Secondary | ICD-10-CM | POA: Diagnosis not present

## 2022-01-07 DIAGNOSIS — Z471 Aftercare following joint replacement surgery: Secondary | ICD-10-CM | POA: Diagnosis not present

## 2022-01-11 DIAGNOSIS — R2689 Other abnormalities of gait and mobility: Secondary | ICD-10-CM | POA: Diagnosis not present

## 2022-01-11 DIAGNOSIS — Z471 Aftercare following joint replacement surgery: Secondary | ICD-10-CM | POA: Diagnosis not present

## 2022-01-11 DIAGNOSIS — M25562 Pain in left knee: Secondary | ICD-10-CM | POA: Diagnosis not present

## 2022-01-13 DIAGNOSIS — Z471 Aftercare following joint replacement surgery: Secondary | ICD-10-CM | POA: Diagnosis not present

## 2022-01-13 DIAGNOSIS — R2689 Other abnormalities of gait and mobility: Secondary | ICD-10-CM | POA: Diagnosis not present

## 2022-01-13 DIAGNOSIS — M25562 Pain in left knee: Secondary | ICD-10-CM | POA: Diagnosis not present

## 2022-01-21 DIAGNOSIS — M25562 Pain in left knee: Secondary | ICD-10-CM | POA: Diagnosis not present

## 2022-01-21 DIAGNOSIS — Z471 Aftercare following joint replacement surgery: Secondary | ICD-10-CM | POA: Diagnosis not present

## 2022-01-21 DIAGNOSIS — R2689 Other abnormalities of gait and mobility: Secondary | ICD-10-CM | POA: Diagnosis not present

## 2022-01-25 DIAGNOSIS — M25562 Pain in left knee: Secondary | ICD-10-CM | POA: Diagnosis not present

## 2022-01-25 DIAGNOSIS — L900 Lichen sclerosus et atrophicus: Secondary | ICD-10-CM | POA: Diagnosis not present

## 2022-01-25 DIAGNOSIS — R2689 Other abnormalities of gait and mobility: Secondary | ICD-10-CM | POA: Diagnosis not present

## 2022-01-25 DIAGNOSIS — Z471 Aftercare following joint replacement surgery: Secondary | ICD-10-CM | POA: Diagnosis not present

## 2022-02-01 DIAGNOSIS — M25562 Pain in left knee: Secondary | ICD-10-CM | POA: Diagnosis not present

## 2022-02-01 DIAGNOSIS — R2689 Other abnormalities of gait and mobility: Secondary | ICD-10-CM | POA: Diagnosis not present

## 2022-02-01 DIAGNOSIS — Z471 Aftercare following joint replacement surgery: Secondary | ICD-10-CM | POA: Diagnosis not present

## 2022-02-03 DIAGNOSIS — Z471 Aftercare following joint replacement surgery: Secondary | ICD-10-CM | POA: Diagnosis not present

## 2022-02-03 DIAGNOSIS — M25562 Pain in left knee: Secondary | ICD-10-CM | POA: Diagnosis not present

## 2022-02-03 DIAGNOSIS — R2689 Other abnormalities of gait and mobility: Secondary | ICD-10-CM | POA: Diagnosis not present

## 2022-02-10 ENCOUNTER — Encounter (INDEPENDENT_AMBULATORY_CARE_PROVIDER_SITE_OTHER): Payer: Medicare Other | Admitting: Ophthalmology

## 2022-02-17 ENCOUNTER — Other Ambulatory Visit: Payer: Medicare Other

## 2022-02-21 DIAGNOSIS — L900 Lichen sclerosus et atrophicus: Secondary | ICD-10-CM | POA: Diagnosis not present

## 2022-02-22 DIAGNOSIS — Z299 Encounter for prophylactic measures, unspecified: Secondary | ICD-10-CM | POA: Diagnosis not present

## 2022-02-22 DIAGNOSIS — I10 Essential (primary) hypertension: Secondary | ICD-10-CM | POA: Diagnosis not present

## 2022-02-22 DIAGNOSIS — R42 Dizziness and giddiness: Secondary | ICD-10-CM | POA: Diagnosis not present

## 2022-02-23 ENCOUNTER — Encounter (INDEPENDENT_AMBULATORY_CARE_PROVIDER_SITE_OTHER): Payer: Medicare Other | Admitting: Ophthalmology

## 2022-02-23 DIAGNOSIS — I10 Essential (primary) hypertension: Secondary | ICD-10-CM | POA: Diagnosis not present

## 2022-02-23 DIAGNOSIS — H43811 Vitreous degeneration, right eye: Secondary | ICD-10-CM | POA: Diagnosis not present

## 2022-02-23 DIAGNOSIS — H35342 Macular cyst, hole, or pseudohole, left eye: Secondary | ICD-10-CM | POA: Diagnosis not present

## 2022-02-23 DIAGNOSIS — H35033 Hypertensive retinopathy, bilateral: Secondary | ICD-10-CM

## 2022-03-14 DIAGNOSIS — Z299 Encounter for prophylactic measures, unspecified: Secondary | ICD-10-CM | POA: Diagnosis not present

## 2022-03-14 DIAGNOSIS — I10 Essential (primary) hypertension: Secondary | ICD-10-CM | POA: Diagnosis not present

## 2022-03-14 DIAGNOSIS — L239 Allergic contact dermatitis, unspecified cause: Secondary | ICD-10-CM | POA: Diagnosis not present

## 2022-04-04 DIAGNOSIS — E119 Type 2 diabetes mellitus without complications: Secondary | ICD-10-CM | POA: Diagnosis not present

## 2022-04-04 DIAGNOSIS — H2511 Age-related nuclear cataract, right eye: Secondary | ICD-10-CM | POA: Diagnosis not present

## 2022-04-04 DIAGNOSIS — H35373 Puckering of macula, bilateral: Secondary | ICD-10-CM | POA: Diagnosis not present

## 2022-04-04 DIAGNOSIS — H35033 Hypertensive retinopathy, bilateral: Secondary | ICD-10-CM | POA: Diagnosis not present

## 2022-04-04 DIAGNOSIS — H524 Presbyopia: Secondary | ICD-10-CM | POA: Diagnosis not present

## 2022-04-04 DIAGNOSIS — H35342 Macular cyst, hole, or pseudohole, left eye: Secondary | ICD-10-CM | POA: Diagnosis not present

## 2022-04-12 DIAGNOSIS — I10 Essential (primary) hypertension: Secondary | ICD-10-CM | POA: Diagnosis not present

## 2022-04-12 DIAGNOSIS — E1129 Type 2 diabetes mellitus with other diabetic kidney complication: Secondary | ICD-10-CM | POA: Diagnosis not present

## 2022-04-12 DIAGNOSIS — R809 Proteinuria, unspecified: Secondary | ICD-10-CM | POA: Diagnosis not present

## 2022-04-12 DIAGNOSIS — Z299 Encounter for prophylactic measures, unspecified: Secondary | ICD-10-CM | POA: Diagnosis not present

## 2022-04-25 DIAGNOSIS — R109 Unspecified abdominal pain: Secondary | ICD-10-CM | POA: Diagnosis not present

## 2022-04-25 DIAGNOSIS — R197 Diarrhea, unspecified: Secondary | ICD-10-CM | POA: Diagnosis not present

## 2022-07-14 DIAGNOSIS — Z0000 Encounter for general adult medical examination without abnormal findings: Secondary | ICD-10-CM | POA: Diagnosis not present

## 2022-07-14 DIAGNOSIS — M810 Age-related osteoporosis without current pathological fracture: Secondary | ICD-10-CM | POA: Diagnosis not present

## 2022-07-14 DIAGNOSIS — E7800 Pure hypercholesterolemia, unspecified: Secondary | ICD-10-CM | POA: Diagnosis not present

## 2022-07-14 DIAGNOSIS — Z299 Encounter for prophylactic measures, unspecified: Secondary | ICD-10-CM | POA: Diagnosis not present

## 2022-07-14 DIAGNOSIS — R5383 Other fatigue: Secondary | ICD-10-CM | POA: Diagnosis not present

## 2022-07-14 DIAGNOSIS — Z7189 Other specified counseling: Secondary | ICD-10-CM | POA: Diagnosis not present

## 2022-07-14 DIAGNOSIS — E1165 Type 2 diabetes mellitus with hyperglycemia: Secondary | ICD-10-CM | POA: Diagnosis not present

## 2022-07-14 DIAGNOSIS — Z79899 Other long term (current) drug therapy: Secondary | ICD-10-CM | POA: Diagnosis not present

## 2022-07-14 DIAGNOSIS — I10 Essential (primary) hypertension: Secondary | ICD-10-CM | POA: Diagnosis not present

## 2022-07-14 DIAGNOSIS — G4733 Obstructive sleep apnea (adult) (pediatric): Secondary | ICD-10-CM | POA: Diagnosis not present

## 2022-08-03 DIAGNOSIS — M1611 Unilateral primary osteoarthritis, right hip: Secondary | ICD-10-CM | POA: Diagnosis not present

## 2022-08-03 DIAGNOSIS — R0989 Other specified symptoms and signs involving the circulatory and respiratory systems: Secondary | ICD-10-CM | POA: Diagnosis not present

## 2022-08-03 DIAGNOSIS — M25551 Pain in right hip: Secondary | ICD-10-CM | POA: Diagnosis not present

## 2022-08-03 DIAGNOSIS — Z299 Encounter for prophylactic measures, unspecified: Secondary | ICD-10-CM | POA: Diagnosis not present

## 2022-08-03 DIAGNOSIS — R1031 Right lower quadrant pain: Secondary | ICD-10-CM | POA: Diagnosis not present

## 2022-08-03 DIAGNOSIS — I10 Essential (primary) hypertension: Secondary | ICD-10-CM | POA: Diagnosis not present

## 2022-08-03 DIAGNOSIS — R1030 Lower abdominal pain, unspecified: Secondary | ICD-10-CM | POA: Diagnosis not present

## 2022-10-12 DIAGNOSIS — Z23 Encounter for immunization: Secondary | ICD-10-CM | POA: Diagnosis not present

## 2022-10-12 DIAGNOSIS — G4733 Obstructive sleep apnea (adult) (pediatric): Secondary | ICD-10-CM | POA: Diagnosis not present

## 2022-10-12 DIAGNOSIS — Z299 Encounter for prophylactic measures, unspecified: Secondary | ICD-10-CM | POA: Diagnosis not present

## 2022-10-12 DIAGNOSIS — Z0000 Encounter for general adult medical examination without abnormal findings: Secondary | ICD-10-CM | POA: Diagnosis not present

## 2022-10-12 DIAGNOSIS — I10 Essential (primary) hypertension: Secondary | ICD-10-CM | POA: Diagnosis not present

## 2022-11-02 DIAGNOSIS — R809 Proteinuria, unspecified: Secondary | ICD-10-CM | POA: Diagnosis not present

## 2022-11-02 DIAGNOSIS — I10 Essential (primary) hypertension: Secondary | ICD-10-CM | POA: Diagnosis not present

## 2022-11-02 DIAGNOSIS — J309 Allergic rhinitis, unspecified: Secondary | ICD-10-CM | POA: Diagnosis not present

## 2022-11-02 DIAGNOSIS — Z299 Encounter for prophylactic measures, unspecified: Secondary | ICD-10-CM | POA: Diagnosis not present

## 2022-11-02 DIAGNOSIS — E1129 Type 2 diabetes mellitus with other diabetic kidney complication: Secondary | ICD-10-CM | POA: Diagnosis not present

## 2022-11-21 ENCOUNTER — Inpatient Hospital Stay: Payer: Medicare Other | Admitting: Adult Health

## 2022-11-23 ENCOUNTER — Other Ambulatory Visit: Payer: Self-pay | Admitting: Adult Health

## 2022-11-23 DIAGNOSIS — Z1231 Encounter for screening mammogram for malignant neoplasm of breast: Secondary | ICD-10-CM

## 2022-12-13 DIAGNOSIS — R42 Dizziness and giddiness: Secondary | ICD-10-CM | POA: Diagnosis not present

## 2022-12-13 DIAGNOSIS — I10 Essential (primary) hypertension: Secondary | ICD-10-CM | POA: Diagnosis not present

## 2022-12-13 DIAGNOSIS — Z299 Encounter for prophylactic measures, unspecified: Secondary | ICD-10-CM | POA: Diagnosis not present

## 2022-12-15 DIAGNOSIS — Z96652 Presence of left artificial knee joint: Secondary | ICD-10-CM | POA: Diagnosis not present

## 2022-12-22 DIAGNOSIS — G3184 Mild cognitive impairment, so stated: Secondary | ICD-10-CM | POA: Diagnosis not present

## 2022-12-22 DIAGNOSIS — I10 Essential (primary) hypertension: Secondary | ICD-10-CM | POA: Diagnosis not present

## 2022-12-22 DIAGNOSIS — Z299 Encounter for prophylactic measures, unspecified: Secondary | ICD-10-CM | POA: Diagnosis not present

## 2022-12-22 DIAGNOSIS — R413 Other amnesia: Secondary | ICD-10-CM | POA: Diagnosis not present

## 2022-12-29 ENCOUNTER — Ambulatory Visit: Payer: Medicare Other

## 2023-01-05 ENCOUNTER — Ambulatory Visit
Admission: RE | Admit: 2023-01-05 | Discharge: 2023-01-05 | Disposition: A | Discharge status: Home / Self Care | Payer: Medicare Other | Source: Ambulatory Visit | Attending: Adult Health | Admitting: Adult Health

## 2023-01-05 DIAGNOSIS — Z1231 Encounter for screening mammogram for malignant neoplasm of breast: Secondary | ICD-10-CM

## 2023-01-06 ENCOUNTER — Encounter: Payer: Self-pay | Admitting: Adult Health

## 2023-01-06 ENCOUNTER — Inpatient Hospital Stay: Payer: Medicare Other | Attending: Adult Health | Admitting: Adult Health

## 2023-01-06 VITALS — BP 137/82 | HR 104 | Temp 97.8°F | Resp 16 | Wt 236.8 lb

## 2023-01-06 DIAGNOSIS — E785 Hyperlipidemia, unspecified: Secondary | ICD-10-CM | POA: Diagnosis not present

## 2023-01-06 DIAGNOSIS — M8580 Other specified disorders of bone density and structure, unspecified site: Secondary | ICD-10-CM | POA: Insufficient documentation

## 2023-01-06 DIAGNOSIS — M8589 Other specified disorders of bone density and structure, multiple sites: Secondary | ICD-10-CM | POA: Diagnosis not present

## 2023-01-06 DIAGNOSIS — Z170 Estrogen receptor positive status [ER+]: Secondary | ICD-10-CM | POA: Insufficient documentation

## 2023-01-06 DIAGNOSIS — C50212 Malignant neoplasm of upper-inner quadrant of left female breast: Secondary | ICD-10-CM | POA: Insufficient documentation

## 2023-01-06 DIAGNOSIS — R413 Other amnesia: Secondary | ICD-10-CM | POA: Diagnosis not present

## 2023-01-06 DIAGNOSIS — Z803 Family history of malignant neoplasm of breast: Secondary | ICD-10-CM | POA: Insufficient documentation

## 2023-01-06 NOTE — Progress Notes (Signed)
 [facility] Cancer Follow up:    Pacheco, Lindsay C [address] [place] [address]   DIAGNOSIS:  Cancer Staging  Malignant neoplasm of upper-inner quadrant of left breast in female, estrogen receptor positive (HCC) Staging form: Breast, AJCC 7th Edition - Clinical stage from 10/14/2015: Stage IIA (T2, N0, M0) - Signed by Dewey Rush, MD on 10/14/2015 Laterality: Left Tumor size (mm): 22 Estrogen receptor status: Positive Progesterone receptor status: Positive HER2 status: Negative   SUMMARY OF ONCOLOGIC HISTORY: Oncology History  Malignant neoplasm of upper-inner quadrant of left breast in female, estrogen receptor positive (HCC)  09/23/2015 Mammogram   Digital diagnostic mammogram and Ultrasound. Area of shadowing L breast on U/S at 12:00 1 cm from nipple corresponds to distortion on mammography. 3 adjacent masses in L breast at 11:00 could represent benign cysts, indeterminate. These are in the UI quadrant of L breast   09/30/2015 Initial Biopsy   Ultrasound guided biopsy at 12 o clock and 11 o clock position   09/30/2015 Pathology Results   12 o clock position invasive ductal carcinoma low grade, localized DCIS, cribriform type with calcifications. 11 o clock fibrotic and inflamed breast tissue DCIS  ER> 90%, PR > 90%, HER 2 IHC 2+. FISH negative   10/14/2015 Imaging   MRI breasts 2.2 cm irregular enhancing mass in the 12 o'clock region of the left breast corresponding with the biopsied low grade invasive ductal carcinoma and ductal carcinoma in-situ. 1.1 cm irregular enhancement in the 11 o'clock region of the left breast corresponding with the biopsied ductal carcinoma in-situ with fibrotic and inflamed breast tissue.   11/02/2015 Surgery   Procedure: 1. Left breast seed bracketed lumpectomy 2. Left axillary sentinel node biopsy Surgeon Dr Adina Bury   11/02/2015 Pathology Results   Breast, lumpectomy, Left - INVASIVE DUCTAL CARCINOMA, GRADE I/III  SPANNING AT LEAST 2.2 CM. Negative sentinel nodes X 3   12/22/2015 - 02/11/2016 Radiation Therapy   Adjuvant breast radiation [facility]): The Left breast was treated to 50.4 Gy in 28 fractions at 1.8 Gy per fraction.  Left breast was boosted to 10 Gy in 5 fractions at 2 Gy per fraction.     CURRENT THERAPY: observation  INTERVAL HISTORY:  Discussed the use of AI scribe software for clinical note transcription with the patient, who gave verbal consent to proceed.  Lindsay Pacheco 63 y.o. female with a history of breast cancer and knee replacement surgery, presents with ongoing mild discomfort in the breast that had previously been affected by cancer. She describes the sensation as a pinching feeling in the armpit and occasional mild pain in the breast itself. The patient also reports tenderness in the same breast. She has been informed that these symptoms could be due to radiation or scar tissue.  She underwent bilateral breast screening mammogram today, the results are pending.  The patient also reports memory loss issues, which she attributes to her current medication regimen. She has stopped taking atorvastatin  due to concerns about its potential impact on memory. She is also on lamotrigine , which she takes two tablets of daily.  In addition to these issues, the patient has been diagnosed with osteopenia, with lowest of her readings was observed in the forearm. She has been recommended to repeat a bone density test every two years.  The patient has been making efforts to stay active, exercising for at least an hour a day for the past four to five months. She has also had a knee replacement  surgery a year ago, which made the early part of the year challenging for her.  The patient has been dealing with family stressors this year, which she describes as emotionally difficult. Despite these challenges, she has been able to maintain her physical health and continue with her daily activities.  Patient  Active Problem List   Diagnosis Date Noted   OA (osteoarthritis) of knee 11/29/2021   Osteoarthritis of left knee 11/29/2021   IBS (irritable bowel syndrome) 12/10/2020   Bloating 12/10/2020   GERD (gastroesophageal reflux disease) 12/10/2020   Chronic diarrhea 12/10/2020   Genetic testing 02/11/2020   Family history of breast cancer    Family history of polyps in the colon    OSA (obstructive sleep apnea) 02/25/2019   Macular hole, left eye 07/03/2018   Borderline personality disorder (HCC) 06/15/2017   Moderate episode of recurrent major depressive disorder (HCC) 10/22/2015   Malignant neoplasm of upper-inner quadrant of left breast in female, estrogen receptor positive (HCC) 10/14/2015   Diabetes (HCC) 09/24/2015   Hypertension     is allergic to sulfa antibiotics.  MEDICAL HISTORY: Past Medical History:  Diagnosis Date   Anxiety    Arthritis    knees (11/02/2015)   Breast cancer (HCC) 2017   left breast    Breast cancer, left breast (HCC) 09/30/2015   Cataracts, bilateral    just watching right now   Depression    Diabetes mellitus, type II (HCC)    Diverticulitis 1987   Family history of breast cancer    Family history of polyps in the colon    GERD (gastroesophageal reflux disease)    Hyperlipidemia    Hypertension    Irritable bowel syndrome (IBS)    Personal history of radiation therapy    Sleep apnea    does not use CPAP but I'm suppose to (11/02/2015)   SVD (spontaneous vaginal delivery)    x 1    SURGICAL HISTORY: Past Surgical History:  Procedure Laterality Date   25 GAUGE PARS PLANA VITRECTOMY WITH 20 GAUGE MVR PORT FOR MACULAR HOLE Left 07/03/2018   Procedure: 25 GAUGE PARS PLANA VITRECTOMY WITH 20 GAUGE MVR PORT FOR MACULAR HOLE;  Surgeon: Alvia Norleen BIRCH, MD;  Location: [facility] OR;  Service: Ophthalmology;  Laterality: Left;   BIOPSY  12/18/2019   Procedure: BIOPSY;  Surgeon: Golda Claudis PENNER, MD;  [facility];  Service: Endoscopy;;   colon   BIOPSY  12/16/2020   Procedure: BIOPSY;  Surgeon: Eartha Angelia Sieving, MD;  [facility];  Service: Gastroenterology;;   BREAST BIOPSY Left 09/2015   BREAST LUMPECTOMY Left 11/02/2015   BREAST LUMPECTOMY WITH AXILLARY LYMPH NODE BIOPSY Left 11/02/2015   BREAST LUMPECTOMY WITH BRACKETED RADIOACTIVE SEED AND LEFT AXILLARY SENTINEL LYMPH NODE BIOPSY    BREAST LUMPECTOMY WITH RADIOACTIVE SEED AND SENTINEL LYMPH NODE BIOPSY Left 11/02/2015   Procedure: LEFT BREAST LUMPECTOMY WITH BRACKETED RADIOACTIVE SEED AND LEFT AXILLARY SENTINEL LYMPH NODE BIOPSY;  Surgeon: Donnice Bury, MD;  Location:  SURGERY CENTER;  Service: General;  Laterality: Left;   COLONOSCOPY N/A 12/09/2015   Procedure: COLONOSCOPY;  Surgeon: Claudis PENNER Golda, MD;  [facility];  Service: Endoscopy;  Laterality: N/A;  1:00   COLONOSCOPY WITH PROPOFOL  N/A 12/18/2019   Procedure: COLONOSCOPY WITH PROPOFOL ;  Surgeon: Golda Claudis PENNER, MD;  [facility];  Service: Endoscopy;  Laterality: N/A;  1030   ESOPHAGOGASTRODUODENOSCOPY (EGD) WITH PROPOFOL  N/A 12/16/2020   Procedure: ESOPHAGOGASTRODUODENOSCOPY (EGD) WITH  PROPOFOL ;  Surgeon: Eartha Flavors, Toribio, MD;  [facility];  Service: Gastroenterology;  Laterality: N/A;  1:00   EVACUATION BREAST HEMATOMA Left 11/02/2015   EVACUATION BREAST HEMATOMA Left 11/02/2015   Procedure: EVACUATION HEMATOMA BREAST;  Surgeon: Donnice Bury, MD;  Location: [facility];  Service: General;  Laterality: Left;   GAS/FLUID EXCHANGE Left 07/03/2018   Procedure: Gas/Fluid Exchange;  Surgeon: Alvia Norleen BIRCH, MD;  Location: [facility] OR;  Service: Ophthalmology;  Laterality: Left;   JOINT REPLACEMENT     KNEE ARTHROSCOPY Right 2000s   LAPAROSCOPIC CHOLECYSTECTOMY  1995   PHOTOCOAGULATION WITH LASER Left 07/03/2018   Procedure: Photocoagulation With Laser;  Surgeon: Alvia Norleen BIRCH, MD;  Location: [facility] OR;  Service:  Ophthalmology;  Laterality: Left;   POLYPECTOMY  12/09/2015   Procedure: POLYPECTOMY; colon polyp  Surgeon: Claudis RAYMOND Rivet, MD;  [facility];  Service: Endoscopy;;  multiple   TOTAL KNEE ARTHROPLASTY Right 2011   TOTAL KNEE ARTHROPLASTY Left 11/29/2021   Procedure: TOTAL KNEE ARTHROPLASTY;  Surgeon: Melodi Lerner, MD;  Location: [facility];  Service: Orthopedics;  Laterality: Left;   WISDOM TOOTH EXTRACTION      SOCIAL HISTORY: Social History   Socioeconomic History   Marital status: Divorced    Spouse name: Not on file   Number of children: 1   Years of education: Not on file   Highest education level: Not on file  Occupational History   Occupation: disabled/retired  Tobacco Use   Smoking status: Never   Smokeless tobacco: Never  Vaping Use   Vaping status: Never Used  Substance and Sexual Activity   Alcohol use: Yes    Comment: socially    Drug use: Not Currently    Comment: last used marijuana 10 plus years ago   Sexual activity: Not Currently    Birth control/protection: Post-menopausal  Other Topics Concern   Not on file  Social History Narrative   Lives Alone   Right Handed   Drinks 2 cups of caffeine daily   Social Drivers of Health   Financial Resource Strain: Not on file  Food Insecurity: No Food Insecurity (11/29/2021)   Hunger Vital Sign    Worried About Running Out of Food in the Last Year: Never true    Ran Out of Food in the Last Year: Never true  Transportation Needs: No Transportation Needs (11/29/2021)   PRAPARE - Administrator, Civil Service (Medical): No    Lack of Transportation (Non-Medical): No  Physical Activity: Not on file  Stress: Not on file  Social Connections: Not on file  Intimate Partner Violence: Not At Risk (11/29/2021)   Humiliation, Afraid, Rape, and Kick questionnaire    Fear of Current or Ex-Partner: No    Emotionally Abused: No    Physically Abused: No    Sexually Abused: No    FAMILY  HISTORY: Family History  Problem Relation Age of Onset   Anxiety disorder Father    Depression Father    Other Father        Abestosis   Depression Sister    Diabetes Mother    Breast cancer Paternal Aunt     Review of Systems  Constitutional:  Negative for appetite change, chills, fatigue, fever and unexpected weight change.  HENT:   Negative for hearing loss, lump/mass and trouble swallowing.   Eyes:  Negative for eye problems and icterus.  Respiratory:  Negative for chest tightness, cough and shortness of breath.  Cardiovascular:  Negative for chest pain, leg swelling and palpitations.  Gastrointestinal:  Negative for abdominal distention, abdominal pain, constipation, diarrhea, nausea and vomiting.  Endocrine: Negative for hot flashes.  Genitourinary:  Negative for difficulty urinating.   Musculoskeletal:  Negative for arthralgias.  Skin:  Negative for itching and rash.  Neurological:  Negative for dizziness, extremity weakness, headaches and numbness.  Hematological:  Negative for adenopathy. Does not bruise/bleed easily.  Psychiatric/Behavioral:  Negative for depression. The patient is not nervous/anxious.       PHYSICAL EXAMINATION    Vitals:   01/06/23 0854  BP: 137/82  Pulse: (!) 104  Resp: 16  Temp: 97.8 F (36.6 C)  SpO2: 98%    Physical Exam Constitutional:      General: She is not in acute distress.    Appearance: Normal appearance. She is not toxic-appearing.  HENT:     Head: Normocephalic and atraumatic.     Mouth/Throat:     Mouth: Mucous membranes are moist.     Pharynx: Oropharynx is clear. No oropharyngeal exudate or posterior oropharyngeal erythema.  Eyes:     General: No scleral icterus. Cardiovascular:     Rate and Rhythm: Normal rate and regular rhythm.     Pulses: Normal pulses.     Heart sounds: Normal heart sounds.  Pulmonary:     Effort: Pulmonary effort is normal.     Breath sounds: Normal breath sounds.  Chest:     Comments:  Left breast benign, right breast s/p lumpectomy and radiation, no sign of local recurrence.  Abdominal:     General: Abdomen is flat. Bowel sounds are normal. There is no distension.     Palpations: Abdomen is soft.     Tenderness: There is no abdominal tenderness.  Musculoskeletal:        General: No swelling.     Cervical back: Neck supple.  Lymphadenopathy:     Cervical: No cervical adenopathy.     Upper Body:     Right upper body: No axillary adenopathy.     Left upper body: No axillary adenopathy.  Skin:    General: Skin is warm and dry.     Findings: No rash.  Neurological:     General: No focal deficit present.     Mental Status: She is alert.  Psychiatric:        Mood and Affect: Mood normal.        Behavior: Behavior normal.        ASSESSMENT and THERAPY PLAN:   Malignant neoplasm of upper-inner quadrant of left breast in female, estrogen receptor positive (HCC) Lindsay Pacheco is a 62 year old woman with h/o stage IIA ER/PR positive breast cancer diagnosed in 09/2015 s/p lumpectomy, adjuvant radiation therapy, and antiestrogen therapy with Letrozole  x 5 years completed in 02/2021.  Breast Cancer No new changes or concerns. Mild tenderness and occasional pain in the affected breast, likely due to prior radiation and/or scar tissue. Mammogram performed, results pending. - Await mammogram results and follow up in one year for continued long term surveillance or sooner if needed.  Osteopenia Last bone density scan in March 2023 showed thinning of the bones, most notably in the forearm. - Repeat bone density scan in March 2025.  Hyperlipidemia Patient stopped taking Atorvastatin  due to concerns about memory loss. Currently on Rosuvastatin (Crestor) - Continue f/u with PCP about hyperlipidemia.  Mental Health Reports memory loss. Currently taking Lamotrigine , 2 tablets daily. - Continue Lamotrigine  as prescribed. Follow up with  mental health provider.  General Health  Maintenance Up to date with colonoscopy and Pap smear. Regular exercise regimen in place. - Continue current health maintenance practices.  All questions were answered. The patient knows to call the clinic with any problems, questions or concerns. We can certainly see the patient much sooner if necessary.  Total encounter time:30 minutes*in face-to-face visit time, chart review, lab review, care coordination, order entry, and documentation of the encounter time.    Lindsay Kendall, NP 01/06/23 3:22 PM Medical Oncology and Hematology [facility] [address] [place], [place] [address] Tel. [phone]    Fax. [phone]  *Total Encounter Time as defined by the Centers for Medicare and Medicaid Services includes, in addition to the face-to-face time of a patient visit (documented in the note above) non-face-to-face time: obtaining and reviewing outside history, ordering and reviewing medications, tests or procedures, care coordination (communications with other health care professionals or caregivers) and documentation in the medical record.

## 2023-01-06 NOTE — Assessment & Plan Note (Signed)
 Lindsay Pacheco is a 63 year old woman with h/o stage IIA ER/PR positive breast cancer diagnosed in 09/2015 s/p lumpectomy, adjuvant radiation therapy, and antiestrogen therapy with Letrozole  x 5 years completed in 02/2021.  Breast Cancer No new changes or concerns. Mild tenderness and occasional pain in the affected breast, likely due to prior radiation and/or scar tissue. Mammogram performed, results pending. - Await mammogram results and follow up in one year for continued long term surveillance or sooner if needed.  Osteopenia Last bone density scan in March 2023 showed thinning of the bones, most notably in the forearm. - Repeat bone density scan in March 2025.  Hyperlipidemia Patient stopped taking Atorvastatin  due to concerns about memory loss. Currently on Rosuvastatin (Crestor) - Continue f/u with PCP about hyperlipidemia.  Mental Health Reports memory loss. Currently taking Lamotrigine , 2 tablets daily. - Continue Lamotrigine  as prescribed. Follow up with mental health provider.  General Health Maintenance Up to date with colonoscopy and Pap smear. Regular exercise regimen in place. - Continue current health maintenance practices.

## 2023-01-09 DIAGNOSIS — I10 Essential (primary) hypertension: Secondary | ICD-10-CM | POA: Diagnosis not present

## 2023-01-09 DIAGNOSIS — R413 Other amnesia: Secondary | ICD-10-CM | POA: Diagnosis not present

## 2023-01-09 DIAGNOSIS — Z299 Encounter for prophylactic measures, unspecified: Secondary | ICD-10-CM | POA: Diagnosis not present

## 2023-01-11 DIAGNOSIS — R413 Other amnesia: Secondary | ICD-10-CM | POA: Diagnosis not present

## 2023-01-17 DIAGNOSIS — Z299 Encounter for prophylactic measures, unspecified: Secondary | ICD-10-CM | POA: Diagnosis not present

## 2023-01-17 DIAGNOSIS — I10 Essential (primary) hypertension: Secondary | ICD-10-CM | POA: Diagnosis not present

## 2023-01-17 DIAGNOSIS — G3184 Mild cognitive impairment, so stated: Secondary | ICD-10-CM | POA: Diagnosis not present

## 2023-01-24 DIAGNOSIS — R109 Unspecified abdominal pain: Secondary | ICD-10-CM | POA: Diagnosis not present

## 2023-01-24 DIAGNOSIS — Z860100 Personal history of colon polyps, unspecified: Secondary | ICD-10-CM | POA: Diagnosis not present

## 2023-01-24 DIAGNOSIS — R197 Diarrhea, unspecified: Secondary | ICD-10-CM | POA: Diagnosis not present

## 2023-01-25 DIAGNOSIS — J069 Acute upper respiratory infection, unspecified: Secondary | ICD-10-CM | POA: Diagnosis not present

## 2023-01-25 DIAGNOSIS — R0981 Nasal congestion: Secondary | ICD-10-CM | POA: Diagnosis not present

## 2023-02-08 DIAGNOSIS — R809 Proteinuria, unspecified: Secondary | ICD-10-CM | POA: Diagnosis not present

## 2023-02-08 DIAGNOSIS — E1129 Type 2 diabetes mellitus with other diabetic kidney complication: Secondary | ICD-10-CM | POA: Diagnosis not present

## 2023-02-08 DIAGNOSIS — Z299 Encounter for prophylactic measures, unspecified: Secondary | ICD-10-CM | POA: Diagnosis not present

## 2023-02-08 DIAGNOSIS — E1165 Type 2 diabetes mellitus with hyperglycemia: Secondary | ICD-10-CM | POA: Diagnosis not present

## 2023-02-08 DIAGNOSIS — I10 Essential (primary) hypertension: Secondary | ICD-10-CM | POA: Diagnosis not present

## 2023-02-23 ENCOUNTER — Encounter (INDEPENDENT_AMBULATORY_CARE_PROVIDER_SITE_OTHER): Payer: Medicare Other | Admitting: Ophthalmology

## 2023-03-01 ENCOUNTER — Encounter (INDEPENDENT_AMBULATORY_CARE_PROVIDER_SITE_OTHER): Payer: Medicare Other | Admitting: Ophthalmology

## 2023-03-01 DIAGNOSIS — I10 Essential (primary) hypertension: Secondary | ICD-10-CM

## 2023-03-01 DIAGNOSIS — H2511 Age-related nuclear cataract, right eye: Secondary | ICD-10-CM

## 2023-03-01 DIAGNOSIS — H35342 Macular cyst, hole, or pseudohole, left eye: Secondary | ICD-10-CM

## 2023-03-01 DIAGNOSIS — H35033 Hypertensive retinopathy, bilateral: Secondary | ICD-10-CM

## 2023-03-01 DIAGNOSIS — H43811 Vitreous degeneration, right eye: Secondary | ICD-10-CM | POA: Diagnosis not present

## 2023-03-24 ENCOUNTER — Telehealth: Payer: Self-pay

## 2023-03-24 DIAGNOSIS — Z79899 Other long term (current) drug therapy: Secondary | ICD-10-CM

## 2023-03-24 NOTE — Progress Notes (Signed)
   03/24/2023  Patient ID: Lindsay Pacheco, female   DOB: [birth year], 63 y.o.   MRN: [id number]  Attempted to contact patient for medication management/review. I was unable to leave a HIPAA compliant message for patient to return my call at their convenience.   First attempt for patient outreach. Will follow up with patient in 5-7 business days.  Per chart review for Triple Fails Report:  Lisinopril/HCTZ 20/12.5 mg was last filled on 01/17/2023 for a 90-day supply, per Dr. Tiajuana Amass. The next fill is due around 04/17/2023. Will need to follow up to make sure patient fills medication.   Rosuvastatin 5 mg was last filled on 10/12/2022 for a 90-day supply, per Dr. Tiajuana Amass. The next fill is due in January 2025. This medication is overdue, and it is unclear if it was discontinued. Per chart review in [facility] EMR, patient should continue medication.   Glimepiride 2 mg was last filled on 04/20/2022 for a 90-day supply, per Dr. Tiajuana Amass. This medication is overdue, and it is unclear if it was discontinued. Per chart review in [facility] EMR, patient should continue medication.   Ozempic was last filled on 06/23/2022 for an 84-day supply, per Dr. Tiajuana Amass. The PAP for Ozempic has been confirmed in [facility] EMR.       Thank you for allowing pharmacy to be a part of this patient's care.  Cephus Shelling, PharmD Clinical Pharmacist Cell: [phone]

## 2023-04-05 ENCOUNTER — Telehealth: Payer: Self-pay

## 2023-04-05 DIAGNOSIS — Z79899 Other long term (current) drug therapy: Secondary | ICD-10-CM

## 2023-04-05 NOTE — Progress Notes (Signed)
   04/05/2023  Patient ID: Lindsay Pacheco, female   DOB: February 09, 1960, 63 y.o.   MRN: [id number]  Attempted to contact patient for medication management/review. I was unable to leave a HIPAA compliant message for patient to return my call at their convenience.   Second attempt for patient outreach. Will follow up with patient in 5-7 business days.  Per chart review for Triple Fails Report:  Lisinopril/HCTZ 20/12.5 mg was last filled on 01/17/2023 for a 90-day supply, per Dr. Tiajuana Amass. The next fill is due around 04/17/2023. Will need to follow up to make sure patient fills medication before next refill date.   Rosuvastatin 5 mg was last filled on 10/12/2022 for a 90-day supply, per Dr. Tiajuana Amass. The next fill is due in January 2025. This medication is overdue, and it is unclear if it was discontinued. Per chart review in [facility] EMR, patient should continue medication.   Glimepiride 2 mg was last filled on 04/20/2022 for a 90-day supply, per Dr. Tiajuana Amass. This medication is overdue, and it is unclear if it was discontinued. Per chart review in [facility] EMR, patient should continue medication.   Ozempic was last filled on 06/23/2022 for an 84-day supply, per Dr. Tiajuana Amass. The PAP for Ozempic has been confirmed in [facility] EMR. Will monitor for re-enrollment as her PAP expires on 01/03/2024 per approval letter.   Patient message sent in [facility] about medications that need refills.        Thank you for allowing pharmacy to be a part of this patient's care.  Cephus Shelling, PharmD Clinical Pharmacist Cell: [phone]

## 2023-04-14 ENCOUNTER — Telehealth: Payer: Self-pay

## 2023-04-14 DIAGNOSIS — H2511 Age-related nuclear cataract, right eye: Secondary | ICD-10-CM | POA: Diagnosis not present

## 2023-04-14 DIAGNOSIS — H524 Presbyopia: Secondary | ICD-10-CM | POA: Diagnosis not present

## 2023-04-14 DIAGNOSIS — H35373 Puckering of macula, bilateral: Secondary | ICD-10-CM | POA: Diagnosis not present

## 2023-04-14 DIAGNOSIS — Z79899 Other long term (current) drug therapy: Secondary | ICD-10-CM

## 2023-04-14 DIAGNOSIS — E119 Type 2 diabetes mellitus without complications: Secondary | ICD-10-CM | POA: Diagnosis not present

## 2023-04-14 DIAGNOSIS — H35342 Macular cyst, hole, or pseudohole, left eye: Secondary | ICD-10-CM | POA: Diagnosis not present

## 2023-04-14 NOTE — Progress Notes (Signed)
   04/14/2023  Patient ID: Lindsay Pacheco, female   DOB: 05/11/1960, 63 y.o.   MRN: [id number]  Medication Adherence:   Lisinopril/HCTZ - last filled on 04/12/2023 x 90 days but no sold date, per Dr. Tiajuana Amass. She last picked up on 01/24/2023 for 90-days supply, per [facility] and confirmed there is an order ready for this medication.  Rosuvastatin 5 mg last filled on 10/12/2022 x 90 days, Dr. Tiajuana Amass. She last picked up on 10/18/2022 x 90- days supply,  per [employer].  Glimepiride 2 mg was last filled on 04/20/2022 for a 90-day supply, Dr. Tiajuana Amass. Medication has not been filled at [facility]. [facility] is permanently closed and I was unable to reach pharmacy staff.  I called patient and was unable to leave a voice message as her voicemail has not been set up.  Will message clinic to verify whether patient is supposed to be on glimepiride (since she is taking Ozempic) and rosuvastatin.  Thank you for allowing pharmacy to be a part of this patient's care. Cephus Shelling, PharmD Clinical Pharmacist Cell: [phone]

## 2023-05-11 DIAGNOSIS — E1165 Type 2 diabetes mellitus with hyperglycemia: Secondary | ICD-10-CM | POA: Diagnosis not present

## 2023-05-11 DIAGNOSIS — Z299 Encounter for prophylactic measures, unspecified: Secondary | ICD-10-CM | POA: Diagnosis not present

## 2023-05-11 DIAGNOSIS — I10 Essential (primary) hypertension: Secondary | ICD-10-CM | POA: Diagnosis not present

## 2023-06-08 ENCOUNTER — Telehealth: Payer: Self-pay

## 2023-06-08 NOTE — Progress Notes (Signed)
   06/08/2023  Patient ID: Lindsay Pacheco, female   DOB: 05-31-1960, 63 y.o.   MRN: [id number]  Contacted patient regarding medication adherence from a quality report for [facility]. The patient failed MAC and MAD in 2024.   Per [facility] and payor portal fill history:  Lisinopril -Hydrochlorothiazide  20-12.5 mg - last filled 04/12/23 for a 90-day supply.  Rosuvastatin 5 mg - last filled 10/12/22 for a 90-day supply. Ozempic 0.5 mg - last filled 12/07/22 for a 28-day supply. Glimepiride  2 mg - last filled 04/20/22 for a 90-day supply.   I spoke to the patient today regarding her statin and diabetes medication non-adherence. She reports still having the medications at home and taking regularly. She admits to only taking rosuvastatin weekly for a while, due to having memory impairment, but is back taking it once a week. Will verify if glimepiride  has been discontinued but the patient did report, with her A1c improving, her PCP reduced her dose to once daily.   The patient declined my assistance with medication refills and prescriber clarification. She will discuss with PCP at next appointment.   Livia Riffle, PharmD Clinical Pharmacist  [phone]

## 2023-06-22 DIAGNOSIS — Z5181 Encounter for therapeutic drug level monitoring: Secondary | ICD-10-CM | POA: Diagnosis not present

## 2023-07-27 DIAGNOSIS — Z0000 Encounter for general adult medical examination without abnormal findings: Secondary | ICD-10-CM | POA: Diagnosis not present

## 2023-07-27 DIAGNOSIS — Z79899 Other long term (current) drug therapy: Secondary | ICD-10-CM | POA: Diagnosis not present

## 2023-07-27 DIAGNOSIS — Z7189 Other specified counseling: Secondary | ICD-10-CM | POA: Diagnosis not present

## 2023-07-27 DIAGNOSIS — I10 Essential (primary) hypertension: Secondary | ICD-10-CM | POA: Diagnosis not present

## 2023-07-27 DIAGNOSIS — R5383 Other fatigue: Secondary | ICD-10-CM | POA: Diagnosis not present

## 2023-07-27 DIAGNOSIS — Z299 Encounter for prophylactic measures, unspecified: Secondary | ICD-10-CM | POA: Diagnosis not present

## 2023-07-27 DIAGNOSIS — Z1389 Encounter for screening for other disorder: Secondary | ICD-10-CM | POA: Diagnosis not present

## 2023-07-27 DIAGNOSIS — E7800 Pure hypercholesterolemia, unspecified: Secondary | ICD-10-CM | POA: Diagnosis not present

## 2023-07-31 DIAGNOSIS — R5383 Other fatigue: Secondary | ICD-10-CM | POA: Diagnosis not present

## 2023-07-31 DIAGNOSIS — E7800 Pure hypercholesterolemia, unspecified: Secondary | ICD-10-CM | POA: Diagnosis not present

## 2023-07-31 DIAGNOSIS — Z79899 Other long term (current) drug therapy: Secondary | ICD-10-CM | POA: Diagnosis not present

## 2023-08-01 NOTE — Procedures (Signed)
Mask fit

## 2023-08-11 DIAGNOSIS — M25551 Pain in right hip: Secondary | ICD-10-CM | POA: Diagnosis not present

## 2023-08-15 DIAGNOSIS — H60542 Acute eczematoid otitis externa, left ear: Secondary | ICD-10-CM | POA: Diagnosis not present

## 2023-08-15 DIAGNOSIS — G4733 Obstructive sleep apnea (adult) (pediatric): Secondary | ICD-10-CM | POA: Diagnosis not present

## 2023-08-16 DIAGNOSIS — M25551 Pain in right hip: Secondary | ICD-10-CM | POA: Diagnosis not present

## 2023-08-22 DIAGNOSIS — E119 Type 2 diabetes mellitus without complications: Secondary | ICD-10-CM | POA: Diagnosis not present

## 2023-08-22 DIAGNOSIS — Z299 Encounter for prophylactic measures, unspecified: Secondary | ICD-10-CM | POA: Diagnosis not present

## 2023-08-22 DIAGNOSIS — I10 Essential (primary) hypertension: Secondary | ICD-10-CM | POA: Diagnosis not present

## 2023-09-15 ENCOUNTER — Other Ambulatory Visit (HOSPITAL_COMMUNITY)

## 2023-09-15 ENCOUNTER — Other Ambulatory Visit: Payer: Medicare Other

## 2023-10-18 ENCOUNTER — Encounter (INDEPENDENT_AMBULATORY_CARE_PROVIDER_SITE_OTHER): Payer: Self-pay | Admitting: Gastroenterology

## 2024-01-02 ENCOUNTER — Other Ambulatory Visit: Payer: Self-pay | Admitting: Adult Health

## 2024-01-02 DIAGNOSIS — Z1231 Encounter for screening mammogram for malignant neoplasm of breast: Secondary | ICD-10-CM

## 2024-01-09 ENCOUNTER — Inpatient Hospital Stay: Admission: RE | Admit: 2024-01-09 | Discharge: 2024-01-09 | Attending: Adult Health | Admitting: Adult Health

## 2024-01-09 ENCOUNTER — Encounter: Payer: Medicare Other | Admitting: Adult Health

## 2024-01-09 DIAGNOSIS — Z1231 Encounter for screening mammogram for malignant neoplasm of breast: Secondary | ICD-10-CM

## 2024-01-16 ENCOUNTER — Inpatient Hospital Stay: Attending: Adult Health | Admitting: Adult Health

## 2024-01-16 ENCOUNTER — Encounter: Payer: Self-pay | Admitting: Adult Health

## 2024-01-16 VITALS — BP 106/67 | HR 96 | Temp 97.7°F | Resp 14 | Ht 68.0 in | Wt 238.0 lb

## 2024-01-16 DIAGNOSIS — C50212 Malignant neoplasm of upper-inner quadrant of left female breast: Secondary | ICD-10-CM

## 2024-01-16 DIAGNOSIS — Z170 Estrogen receptor positive status [ER+]: Secondary | ICD-10-CM | POA: Diagnosis not present

## 2024-01-16 NOTE — Progress Notes (Signed)
 [name], Lindsay Pacheco [address] [place] [address]   DIAGNOSIS:  Cancer Staging  Malignant neoplasm of upper-inner quadrant of left breast in female, estrogen receptor positive (HCC) Staging form: Breast, AJCC 7th Edition - Clinical stage from 10/14/2015: Stage IIA (T2, N0, M0) - Signed by Dewey Rush, MD on 10/14/2015 Laterality: Left Tumor size (mm): 22 Estrogen receptor status: Positive Progesterone receptor status: Positive HER2 status: Negative    SUMMARY OF ONCOLOGIC HISTORY: Oncology History  Malignant neoplasm of upper-inner quadrant of left breast in female, estrogen receptor positive (HCC)  09/23/2015 Mammogram   Digital diagnostic mammogram and Ultrasound. Area of shadowing L breast on U/S at 12:00 1 cm from nipple corresponds to distortion on mammography. 3 adjacent masses in L breast at 11:00 could represent benign cysts, indeterminate. These are in the UI quadrant of L breast   09/30/2015 Initial Biopsy   Ultrasound guided biopsy at 12 o clock and 11 o clock position   09/30/2015 Pathology Results   12 o clock position invasive ductal carcinoma low grade, localized DCIS, cribriform type with calcifications. 11 o clock fibrotic and inflamed breast tissue DCIS  ER> 90%, PR > 90%, HER 2 IHC 2+. FISH negative   10/14/2015 Imaging   MRI breasts 2.2 cm irregular enhancing mass in the 12 o'clock region of the left breast corresponding with the biopsied low grade invasive ductal carcinoma and ductal carcinoma in-situ. 1.1 cm irregular enhancement in the 11 o'clock region of the left breast corresponding with the biopsied ductal carcinoma in-situ with fibrotic and inflamed breast tissue.   11/02/2015 Surgery   Procedure: 1. Left breast seed bracketed lumpectomy 2. Left axillary sentinel node biopsy Surgeon Dr Adina Bury   11/02/2015 Pathology Results   Breast, lumpectomy, Left - INVASIVE DUCTAL CARCINOMA, GRADE  I/III SPANNING AT LEAST 2.2 CM. Negative sentinel nodes X 3   12/22/2015 - 02/11/2016 Radiation Therapy   Adjuvant breast radiation [facility]): The Left breast was treated to 50.4 Gy in 28 fractions at 1.8 Gy per fraction.  Left breast was boosted to 10 Gy in 5 fractions at 2 Gy per fraction.     CURRENT THERAPY: observation  INTERVAL HISTORY:  Discussed the use of AI scribe software for clinical note transcription with the patient, who gave verbal consent to proceed.  [name], [name], status post lumpectomy, adjuvant radiation, and completed letrozole  therapy, presenting for routine oncology follow-up.  She completed letrozole  from February 2018 through November 2023 and is currently on surveillance. Her January 11, 2024 mammogram showed no evidence of malignancy with breast density category B. She has no breast symptoms.  She has intermittent sharp or dull pain localized to the left chest wall that has been present since she underwent surgery.  The pain is brief, nonprogressive, and not associated with tenderness, swelling, erythema, or breast changes. She denies fatigue.  She has osteopenia with a T score of -2.1 in the left forearm in March 2023 and a family history of osteoporosis. She is unsure if a more recent bone density study has been done.  She has type 2 diabetes managed by her primary care physician with Ozempic after stopping metformin  because of nausea and weight loss. Her A1c has been under 6. She notes a recent 7-11 pound weight gain around the holidays and reduced exercise frequency, with  a goal of 2-3 times per week, which improves her energy and mood.   Patient Active Problem List   Diagnosis Date Noted   OA (osteoarthritis) of knee 11/29/2021   Osteoarthritis of left knee 11/29/2021   IBS (irritable bowel syndrome) 12/10/2020   Bloating  12/10/2020   GERD (gastroesophageal reflux disease) 12/10/2020   Chronic diarrhea 12/10/2020   Genetic testing 02/11/2020   Family history of breast cancer    Family history of polyps in the colon    OSA (obstructive sleep apnea) 02/25/2019   Macular hole, left eye 07/03/2018   Borderline personality disorder (HCC) 06/15/2017   Moderate episode of recurrent major depressive disorder (HCC) 10/22/2015   Malignant neoplasm of upper-inner quadrant of left breast in female, estrogen receptor positive (HCC) 10/14/2015   Diabetes (HCC) 09/24/2015   Hypertension     is allergic to sulfa antibiotics.  MEDICAL HISTORY: Past Medical History:  Diagnosis Date   Anxiety    Arthritis    knees (11/02/2015)   Breast cancer (HCC) 2017   left breast    Breast cancer, left breast (HCC) 09/30/2015   Cataracts, bilateral    just watching right now   Depression    Diabetes mellitus, type II (HCC)    Diverticulitis 1987   Family history of breast cancer    Family history of polyps in the colon    GERD (gastroesophageal reflux disease)    Hyperlipidemia    Hypertension    Irritable bowel syndrome (IBS)    Personal history of radiation therapy    Sleep apnea    does not use CPAP but I'm suppose to (11/02/2015)   SVD (spontaneous vaginal delivery)    x 1    SURGICAL HISTORY: Past Surgical History:  Procedure Laterality Date   25 GAUGE PARS PLANA VITRECTOMY WITH 20 GAUGE MVR PORT FOR MACULAR HOLE Left 07/03/2018   Procedure: 25 GAUGE PARS PLANA VITRECTOMY WITH 20 GAUGE MVR PORT FOR MACULAR HOLE;  Surgeon: Alvia Norleen BIRCH, MD;  Location: [facility] OR;  Service: Ophthalmology;  Laterality: Left;   BIOPSY  12/18/2019   Procedure: BIOPSY;  Surgeon: Golda Claudis PENNER, MD;  Location: [facility];  Service: Endoscopy;;  colon   BIOPSY  12/16/2020   Procedure: BIOPSY;  Surgeon: Eartha Angelia Sieving, MD;  Location: [facility];  Service: Gastroenterology;;   BREAST BIOPSY Left 09/2015   BREAST  LUMPECTOMY Left 11/02/2015   BREAST LUMPECTOMY WITH AXILLARY LYMPH NODE BIOPSY Left 11/02/2015   BREAST LUMPECTOMY WITH BRACKETED RADIOACTIVE SEED AND LEFT AXILLARY SENTINEL LYMPH NODE BIOPSY    BREAST LUMPECTOMY WITH RADIOACTIVE SEED AND SENTINEL LYMPH NODE BIOPSY Left 11/02/2015   Procedure: LEFT BREAST LUMPECTOMY WITH BRACKETED RADIOACTIVE SEED AND LEFT AXILLARY SENTINEL LYMPH NODE BIOPSY;  Surgeon: Donnice Bury, MD;  Location: [facility];  Service: General;  Laterality: Left;   COLONOSCOPY N/A 12/09/2015   Procedure: COLONOSCOPY;  Surgeon: Claudis PENNER Golda, MD;  Location: [facility];  Service: Endoscopy;  Laterality: N/A;  1:00   COLONOSCOPY WITH PROPOFOL  N/A 12/18/2019   Procedure: COLONOSCOPY WITH PROPOFOL ;  Surgeon: Golda Claudis PENNER, MD;  [facility]: [facility];  Service: Endoscopy;  Laterality: N/A;  1030   ESOPHAGOGASTRODUODENOSCOPY (EGD) WITH PROPOFOL  N/A 12/16/2020   Procedure: ESOPHAGOGASTRODUODENOSCOPY (EGD) WITH PROPOFOL ;  Surgeon: Eartha Angelia Sieving, MD;  Location: [facility];  Service: Gastroenterology;  Laterality: N/A;  1:00   EVACUATION BREAST HEMATOMA Left 11/02/2015   EVACUATION BREAST HEMATOMA Left 11/02/2015  Procedure: EVACUATION HEMATOMA BREAST;  Surgeon: Donnice Bury, MD;  Location: [facility];  Service: General;  Laterality: Left;   GAS/FLUID EXCHANGE Left 07/03/2018   Procedure: Gas/Fluid Exchange;  Surgeon: Alvia Norleen BIRCH, MD;  Location: [facility] OR;  Service: Ophthalmology;  Laterality: Left;   JOINT REPLACEMENT     KNEE ARTHROSCOPY Right 2000s   LAPAROSCOPIC CHOLECYSTECTOMY  1995   PHOTOCOAGULATION WITH LASER Left 07/03/2018   Procedure: Photocoagulation With Laser;  Surgeon: Alvia Norleen BIRCH, MD;  Location: [facility] OR;  Service: Ophthalmology;  Laterality: Left;   POLYPECTOMY  12/09/2015   Procedure: POLYPECTOMY; colon polyp  Surgeon: Claudis RAYMOND Rivet, MD;  Location: [facility];  Service: Endoscopy;;  multiple   TOTAL  KNEE ARTHROPLASTY Right 2011   TOTAL KNEE ARTHROPLASTY Left 11/29/2021   Procedure: TOTAL KNEE ARTHROPLASTY;  Surgeon: Melodi Lerner, MD;  Location: [facility];  Service: Orthopedics;  Laterality: Left;   WISDOM TOOTH EXTRACTION      SOCIAL HISTORY: Social History   Socioeconomic History   Marital status: Divorced    Spouse name: Not on file   Number of children: 1   Years of education: Not on file   Highest education level: Not on file  Occupational History   Occupation: disabled/retired  Tobacco Use   Smoking status: Never   Smokeless tobacco: Never  Vaping Use   Vaping status: Never Used  Substance and Sexual Activity   Alcohol use: Yes    Comment: socially    Drug use: Not Currently    Comment: last used marijuana 10 plus years ago   Sexual activity: Not Currently    Birth control/protection: Post-menopausal  Other Topics Concern   Not on file  Social History Narrative   Lives Alone   Right Handed   Drinks 2 cups of caffeine daily   Social Drivers of Health   Tobacco Use: Low Risk (01/16/2024)   Patient History    Smoking Tobacco Use: Never    Smokeless Tobacco Use: Never    Passive Exposure: Not on file  Financial Resource Strain: Low Risk (01/16/2024)   Overall Financial Resource Strain (CARDIA)    Difficulty of Paying Living Expenses: Not hard at all  Food Insecurity: No Food Insecurity (01/16/2024)   Epic    Worried About Programme Researcher, Broadcasting/film/video in the Last Year: Never true    Ran Out of Food in the Last Year: Never true  Transportation Needs: No Transportation Needs (01/16/2024)   Epic    Lack of Transportation (Medical): No    Lack of Transportation (Non-Medical): No  Physical Activity: Insufficiently Active (01/16/2024)   Exercise Vital Sign    Days of Exercise per Week: 2 days    Minutes of Exercise per Session: 60 min  Stress: No Stress Concern Present (01/16/2024)   [employer] of Occupational Health - Occupational Stress Questionnaire    Feeling of  Stress: Only a little  Social Connections: Moderately Isolated (01/16/2024)   Social Connection and Isolation Panel    Frequency of Communication with Friends and Family: More than three times a week    Frequency of Social Gatherings with Friends and Family: More than three times a week    Attends Religious Services: 1 to 4 times per year    Active Member of [employer] or Organizations: No    Attends Banker Meetings: Never    Marital Status: Divorced  Catering Manager Violence: Not At Risk (01/16/2024)   Epic    Fear  of Current or Ex-Partner: No    Emotionally Abused: No    Physically Abused: No    Sexually Abused: No  Depression (PHQ2-9): Low Risk (01/16/2024)   Depression (PHQ2-9)    PHQ-2 Score: 0  Alcohol Screen: Low Risk (01/16/2024)   Alcohol Screen    Last Alcohol Screening Score (AUDIT): 0  Housing: Unknown (01/16/2024)   Epic    Unable to Pay for Housing in the Last Year: No    Number of Times Moved in the Last Year: Not on file    Homeless in the Last Year: No  Utilities: Not At Risk (01/16/2024)   Epic    Threatened with loss of utilities: No  Health Literacy: Adequate Health Literacy (01/16/2024)   B1300 Health Literacy    Frequency of need for help with medical instructions: Never    FAMILY HISTORY: Family History  Problem Relation Age of Onset   Anxiety disorder Father    Depression Father    Other Father        Abestosis   Depression Sister    Diabetes Mother    Breast cancer Paternal Aunt     Review of Systems  Constitutional:  Negative for appetite change, chills, fatigue, fever and unexpected weight change.  HENT:   Negative for hearing loss, lump/mass and trouble swallowing.   Eyes:  Negative for eye problems and icterus.  Respiratory:  Negative for chest tightness, cough and shortness of breath.   Cardiovascular:  Negative for chest pain, leg swelling and palpitations.  Gastrointestinal:  Negative for abdominal distention, abdominal pain,  constipation, diarrhea, nausea and vomiting.  Endocrine: Negative for hot flashes.  Genitourinary:  Negative for difficulty urinating.   Musculoskeletal:  Negative for arthralgias.  Skin:  Negative for itching and rash.  Neurological:  Negative for dizziness, extremity weakness, headaches and numbness.  Hematological:  Negative for adenopathy. Does not bruise/bleed easily.  Psychiatric/Behavioral:  Negative for depression. The patient is not nervous/anxious.       PHYSICAL EXAMINATION   Onc Performance Status - 01/16/24 1008       ECOG Perf Status   ECOG Perf Status Restricted in physically strenuous activity but ambulatory and able to carry out work of a light or sedentary nature, e.g., light house work, office work      KPS SCALE   KPS % SCORE Able to carry on normal activity, minor s/s of disease          Vitals:   01/16/24 1006  BP: 106/67  Pulse: 96  Resp: 14  Temp: 97.7 F (36.5 Pacheco)  SpO2: 98%    Physical Exam Constitutional:      General: She is not in acute distress.    Appearance: Normal appearance. She is not toxic-appearing.  HENT:     Head: Normocephalic and atraumatic.     Mouth/Throat:     Mouth: Mucous membranes are moist.     Pharynx: Oropharynx is clear. No oropharyngeal exudate or posterior oropharyngeal erythema.  Eyes:     General: No scleral icterus. Cardiovascular:     Rate and Rhythm: Normal rate and regular rhythm.     Pulses: Normal pulses.     Heart sounds: Normal heart sounds.  Pulmonary:     Effort: Pulmonary effort is normal.     Breath sounds: Normal breath sounds.  Chest:     Comments: Left breast s/p lumpectomy and radiation, no sign of local recurrence; right breast benign Abdominal:     General: Abdomen  is flat. Bowel sounds are normal. There is no distension.     Palpations: Abdomen is soft.     Tenderness: There is no abdominal tenderness.  Musculoskeletal:        General: No swelling.     Cervical back: Neck supple.   Lymphadenopathy:     Cervical: No cervical adenopathy.     Upper Body:     Right upper body: No supraclavicular or axillary adenopathy.     Left upper body: No supraclavicular or axillary adenopathy.  Skin:    General: Skin is warm and dry.     Findings: No rash.  Neurological:     General: No focal deficit present.     Mental Status: She is alert.  Psychiatric:        Mood and Affect: Mood normal.        Behavior: Behavior normal.      ASSESSMENT and THERAPY PLAN:  Assessment and Plan Assessment & Plan Invasive ductal carcinoma of the left breast, ER/PR positive, HER2 negative, status post lumpectomy and adjuvant radiation, completed adjuvant antiestrogen therapy with Letrozole .  No evidence of malignancy on recent mammogram. No signs of clinical recurrence. - Emphasized regular exercise to reduce recurrence risk and improve overall health. - Advised annual mammography. - Continued clinical observation. - Recommended healthy diet and exercise  Osteopenia Osteopenia with T score -2.1 in the left forearm. Increased risk for bone loss due to prior letrozole  therapy and family history of osteoporosis. Bone health prioritized given age and fracture risk. - Requested most recent bone density test from primary care provider. - If no recent bone density test is available, offered repeat bone density testing. - Encouraged regular exercise to support bone health. - Provided education on bone health and fracture prevention.  RTC in 1 year for continued long-term surveillance.   All questions were answered. The patient knows to call the clinic with any problems, questions or concerns. We can certainly see the patient much sooner if necessary.  Total encounter time:20 minutes*in face-to-face visit time, chart review, lab review, care coordination, order entry, and documentation of the encounter time.    Morna Kendall, NP 01/16/2024 1:08 PM Medical Oncology and Hematology [facility] [address] [place], [place] [address] Tel. [phone]    Fax. [phone]  *Total Encounter Time as defined by the Centers for Medicare and Medicaid Services includes, in addition to the face-to-face time of a patient visit (documented in the note above) non-face-to-face time: obtaining and reviewing outside history, ordering and reviewing medications, tests or procedures, care coordination (communications with other health care professionals or caregivers) and documentation in the medical record.

## 2024-02-28 ENCOUNTER — Encounter (INDEPENDENT_AMBULATORY_CARE_PROVIDER_SITE_OTHER): Payer: Medicare Other | Admitting: Ophthalmology

## 2025-01-15 ENCOUNTER — Inpatient Hospital Stay: Admitting: Adult Health

## 2025-01-15 ENCOUNTER — Inpatient Hospital Stay
